# Patient Record
Sex: Female | Born: 1935 | Race: White | Hispanic: No | Smoking: Never smoker
Health system: Southern US, Community
[De-identification: ages and names within clinical notes are randomized; demographics above are authoritative.]

## PROBLEM LIST (undated history)

## (undated) DIAGNOSIS — I480 Paroxysmal atrial fibrillation: Secondary | ICD-10-CM

## (undated) DIAGNOSIS — N362 Urethral caruncle: Secondary | ICD-10-CM

## (undated) DIAGNOSIS — K219 Gastro-esophageal reflux disease without esophagitis: Secondary | ICD-10-CM

## (undated) DIAGNOSIS — Z8719 Personal history of other diseases of the digestive system: Secondary | ICD-10-CM

## (undated) DIAGNOSIS — M1990 Unspecified osteoarthritis, unspecified site: Secondary | ICD-10-CM

## (undated) DIAGNOSIS — I495 Sick sinus syndrome: Secondary | ICD-10-CM

## (undated) DIAGNOSIS — R040 Epistaxis: Secondary | ICD-10-CM

## (undated) DIAGNOSIS — R42 Dizziness and giddiness: Secondary | ICD-10-CM

## (undated) DIAGNOSIS — I447 Left bundle-branch block, unspecified: Secondary | ICD-10-CM

## (undated) DIAGNOSIS — N183 Chronic kidney disease, stage 3 unspecified: Secondary | ICD-10-CM

## (undated) DIAGNOSIS — K589 Irritable bowel syndrome without diarrhea: Secondary | ICD-10-CM

## (undated) DIAGNOSIS — I5032 Chronic diastolic (congestive) heart failure: Secondary | ICD-10-CM

## (undated) DIAGNOSIS — K5790 Diverticulosis of intestine, part unspecified, without perforation or abscess without bleeding: Secondary | ICD-10-CM

## (undated) DIAGNOSIS — C9110 Chronic lymphocytic leukemia of B-cell type not having achieved remission: Secondary | ICD-10-CM

## (undated) DIAGNOSIS — R350 Frequency of micturition: Secondary | ICD-10-CM

## (undated) DIAGNOSIS — I951 Orthostatic hypotension: Secondary | ICD-10-CM

## (undated) DIAGNOSIS — D500 Iron deficiency anemia secondary to blood loss (chronic): Secondary | ICD-10-CM

## (undated) DIAGNOSIS — D369 Benign neoplasm, unspecified site: Secondary | ICD-10-CM

## (undated) DIAGNOSIS — I340 Nonrheumatic mitral (valve) insufficiency: Secondary | ICD-10-CM

## (undated) DIAGNOSIS — I2510 Atherosclerotic heart disease of native coronary artery without angina pectoris: Secondary | ICD-10-CM

## (undated) DIAGNOSIS — I350 Nonrheumatic aortic (valve) stenosis: Secondary | ICD-10-CM

## (undated) DIAGNOSIS — L719 Rosacea, unspecified: Secondary | ICD-10-CM

## (undated) HISTORY — DX: Rosacea, unspecified: L71.9

## (undated) HISTORY — DX: Epistaxis: R04.0

## (undated) HISTORY — DX: Sick sinus syndrome: I49.5

## (undated) HISTORY — DX: Orthostatic hypotension: I95.1

## (undated) HISTORY — DX: Iron deficiency anemia secondary to blood loss (chronic): D50.0

## (undated) HISTORY — PX: CHOLECYSTECTOMY: SHX55

## (undated) HISTORY — PX: LUMBAR DISC SURGERY: SHX700

## (undated) HISTORY — DX: Dizziness and giddiness: R42

## (undated) HISTORY — DX: Irritable bowel syndrome, unspecified: K58.9

## (undated) HISTORY — DX: Gastro-esophageal reflux disease without esophagitis: K21.9

## (undated) HISTORY — DX: Chronic lymphocytic leukemia of B-cell type not having achieved remission: C91.10

## (undated) HISTORY — DX: Nonrheumatic aortic (valve) stenosis: I35.0

## (undated) HISTORY — PX: CATARACT EXTRACTION: SUR2

## (undated) HISTORY — DX: Urethral caruncle: N36.2

## (undated) HISTORY — DX: Frequency of micturition: R35.0

## (undated) HISTORY — PX: CERVICAL SPINE SURGERY: SHX589

## (undated) HISTORY — DX: Personal history of other diseases of the digestive system: Z87.19

## (undated) HISTORY — PX: BLADDER SUSPENSION: SHX72

## (undated) HISTORY — DX: Unspecified osteoarthritis, unspecified site: M19.90

## (undated) HISTORY — DX: Left bundle-branch block, unspecified: I44.7

---

## 1970-03-16 HISTORY — PX: ABDOMINAL HYSTERECTOMY: SHX81

## 2001-05-11 ENCOUNTER — Ambulatory Visit (HOSPITAL_COMMUNITY): Admission: RE | Admit: 2001-05-11 | Discharge: 2001-05-11 | Payer: Self-pay | Admitting: Specialist

## 2001-05-11 ENCOUNTER — Encounter: Payer: Self-pay | Admitting: Specialist

## 2001-06-22 ENCOUNTER — Encounter: Payer: Self-pay | Admitting: Emergency Medicine

## 2001-06-23 ENCOUNTER — Encounter: Payer: Self-pay | Admitting: Cardiology

## 2001-06-23 ENCOUNTER — Inpatient Hospital Stay (HOSPITAL_COMMUNITY): Admission: EM | Admit: 2001-06-23 | Discharge: 2001-06-25 | Payer: Self-pay | Admitting: Emergency Medicine

## 2001-09-13 ENCOUNTER — Encounter (INDEPENDENT_AMBULATORY_CARE_PROVIDER_SITE_OTHER): Payer: Self-pay | Admitting: *Deleted

## 2001-09-13 LAB — CONVERTED CEMR LAB
AST: 20 units/L
Albumin: 4.2 g/dL
Alkaline Phosphatase: 95 units/L
Bilirubin, Direct: 0.1 mg/dL
Total Protein: 7.3 g/dL

## 2002-01-30 ENCOUNTER — Ambulatory Visit (HOSPITAL_COMMUNITY): Admission: RE | Admit: 2002-01-30 | Discharge: 2002-01-30 | Payer: Self-pay | Admitting: Ophthalmology

## 2002-05-15 ENCOUNTER — Encounter: Payer: Self-pay | Admitting: Specialist

## 2002-05-15 ENCOUNTER — Ambulatory Visit (HOSPITAL_COMMUNITY): Admission: RE | Admit: 2002-05-15 | Discharge: 2002-05-15 | Payer: Self-pay | Admitting: Specialist

## 2002-10-09 ENCOUNTER — Ambulatory Visit (HOSPITAL_COMMUNITY): Admission: RE | Admit: 2002-10-09 | Discharge: 2002-10-09 | Payer: Self-pay | Admitting: Pulmonary Disease

## 2003-04-03 ENCOUNTER — Emergency Department (HOSPITAL_COMMUNITY): Admission: EM | Admit: 2003-04-03 | Discharge: 2003-04-03 | Payer: Self-pay | Admitting: Emergency Medicine

## 2003-07-30 ENCOUNTER — Ambulatory Visit (HOSPITAL_COMMUNITY): Admission: RE | Admit: 2003-07-30 | Discharge: 2003-07-30 | Payer: Self-pay | Admitting: Specialist

## 2003-08-25 ENCOUNTER — Emergency Department (HOSPITAL_COMMUNITY): Admission: EM | Admit: 2003-08-25 | Discharge: 2003-08-25 | Payer: Self-pay | Admitting: Emergency Medicine

## 2003-09-11 ENCOUNTER — Ambulatory Visit (HOSPITAL_COMMUNITY): Admission: RE | Admit: 2003-09-11 | Discharge: 2003-09-11 | Payer: Self-pay | Admitting: Pulmonary Disease

## 2003-09-24 ENCOUNTER — Ambulatory Visit (HOSPITAL_COMMUNITY): Admission: RE | Admit: 2003-09-24 | Discharge: 2003-09-24 | Payer: Self-pay | Admitting: Pulmonary Disease

## 2004-07-03 ENCOUNTER — Ambulatory Visit (HOSPITAL_COMMUNITY): Admission: RE | Admit: 2004-07-03 | Discharge: 2004-07-03 | Payer: Self-pay | Admitting: Pulmonary Disease

## 2004-09-03 ENCOUNTER — Ambulatory Visit (HOSPITAL_COMMUNITY): Admission: RE | Admit: 2004-09-03 | Discharge: 2004-09-03 | Payer: Self-pay | Admitting: Specialist

## 2004-10-01 ENCOUNTER — Ambulatory Visit (HOSPITAL_COMMUNITY): Admission: RE | Admit: 2004-10-01 | Discharge: 2004-10-02 | Payer: Self-pay | Admitting: Pulmonary Disease

## 2004-10-03 ENCOUNTER — Ambulatory Visit: Payer: Self-pay | Admitting: Cardiology

## 2005-08-03 ENCOUNTER — Ambulatory Visit: Payer: Self-pay | Admitting: Internal Medicine

## 2005-08-05 ENCOUNTER — Ambulatory Visit: Payer: Self-pay | Admitting: Internal Medicine

## 2005-08-05 ENCOUNTER — Ambulatory Visit (HOSPITAL_COMMUNITY): Admission: RE | Admit: 2005-08-05 | Discharge: 2005-08-05 | Payer: Self-pay | Admitting: Internal Medicine

## 2005-08-05 ENCOUNTER — Encounter (INDEPENDENT_AMBULATORY_CARE_PROVIDER_SITE_OTHER): Payer: Self-pay | Admitting: Specialist

## 2005-08-11 ENCOUNTER — Ambulatory Visit: Payer: Self-pay | Admitting: Internal Medicine

## 2005-10-27 ENCOUNTER — Ambulatory Visit (HOSPITAL_COMMUNITY): Admission: RE | Admit: 2005-10-27 | Discharge: 2005-10-27 | Payer: Self-pay | Admitting: Specialist

## 2005-12-09 ENCOUNTER — Ambulatory Visit: Payer: Self-pay | Admitting: Internal Medicine

## 2005-12-17 ENCOUNTER — Ambulatory Visit: Payer: Self-pay | Admitting: Internal Medicine

## 2005-12-17 ENCOUNTER — Ambulatory Visit (HOSPITAL_COMMUNITY): Admission: RE | Admit: 2005-12-17 | Discharge: 2005-12-17 | Payer: Self-pay | Admitting: Internal Medicine

## 2005-12-17 ENCOUNTER — Encounter (INDEPENDENT_AMBULATORY_CARE_PROVIDER_SITE_OTHER): Payer: Self-pay | Admitting: Specialist

## 2006-02-12 ENCOUNTER — Ambulatory Visit: Payer: Self-pay | Admitting: Internal Medicine

## 2006-04-28 ENCOUNTER — Ambulatory Visit (HOSPITAL_COMMUNITY): Admission: RE | Admit: 2006-04-28 | Discharge: 2006-04-28 | Payer: Self-pay | Admitting: Pulmonary Disease

## 2006-10-04 ENCOUNTER — Ambulatory Visit: Payer: Self-pay | Admitting: Cardiology

## 2006-10-04 ENCOUNTER — Encounter (INDEPENDENT_AMBULATORY_CARE_PROVIDER_SITE_OTHER): Payer: Self-pay | Admitting: Family Medicine

## 2006-10-04 ENCOUNTER — Inpatient Hospital Stay (HOSPITAL_COMMUNITY): Admission: EM | Admit: 2006-10-04 | Discharge: 2006-10-06 | Payer: Self-pay | Admitting: Emergency Medicine

## 2006-10-07 ENCOUNTER — Ambulatory Visit: Payer: Self-pay | Admitting: Cardiology

## 2006-10-14 ENCOUNTER — Ambulatory Visit: Payer: Self-pay | Admitting: Cardiology

## 2006-10-21 ENCOUNTER — Ambulatory Visit: Payer: Self-pay | Admitting: Cardiology

## 2006-10-25 ENCOUNTER — Ambulatory Visit: Payer: Self-pay | Admitting: Cardiology

## 2006-11-02 ENCOUNTER — Ambulatory Visit: Payer: Self-pay | Admitting: Cardiovascular Disease

## 2006-11-02 ENCOUNTER — Inpatient Hospital Stay (HOSPITAL_COMMUNITY): Admission: EM | Admit: 2006-11-02 | Discharge: 2006-11-06 | Payer: Self-pay | Admitting: Emergency Medicine

## 2006-11-12 ENCOUNTER — Ambulatory Visit: Payer: Self-pay | Admitting: Cardiology

## 2006-11-15 DIAGNOSIS — I495 Sick sinus syndrome: Secondary | ICD-10-CM

## 2006-11-15 HISTORY — PX: A-V CARDIAC PACEMAKER INSERTION: SHX562

## 2006-11-16 ENCOUNTER — Encounter: Payer: Self-pay | Admitting: Cardiology

## 2006-11-17 ENCOUNTER — Inpatient Hospital Stay (HOSPITAL_COMMUNITY): Admission: AD | Admit: 2006-11-17 | Discharge: 2006-11-20 | Payer: Self-pay | Admitting: Cardiology

## 2006-11-17 ENCOUNTER — Ambulatory Visit: Payer: Self-pay | Admitting: Internal Medicine

## 2006-11-17 ENCOUNTER — Ambulatory Visit: Payer: Self-pay | Admitting: Cardiology

## 2006-11-25 ENCOUNTER — Ambulatory Visit: Payer: Self-pay

## 2006-12-06 ENCOUNTER — Ambulatory Visit: Payer: Self-pay | Admitting: Cardiology

## 2006-12-13 ENCOUNTER — Ambulatory Visit: Payer: Self-pay | Admitting: Internal Medicine

## 2007-01-03 ENCOUNTER — Ambulatory Visit: Payer: Self-pay | Admitting: Cardiology

## 2007-01-10 ENCOUNTER — Ambulatory Visit: Payer: Self-pay | Admitting: Internal Medicine

## 2007-01-24 ENCOUNTER — Ambulatory Visit: Payer: Self-pay | Admitting: Cardiology

## 2007-02-23 ENCOUNTER — Ambulatory Visit: Payer: Self-pay | Admitting: Internal Medicine

## 2007-03-03 ENCOUNTER — Ambulatory Visit: Payer: Self-pay | Admitting: Internal Medicine

## 2007-03-04 ENCOUNTER — Ambulatory Visit (HOSPITAL_COMMUNITY): Admission: RE | Admit: 2007-03-04 | Discharge: 2007-03-04 | Payer: Self-pay | Admitting: Internal Medicine

## 2007-03-07 ENCOUNTER — Ambulatory Visit: Payer: Self-pay | Admitting: Gastroenterology

## 2007-03-08 ENCOUNTER — Ambulatory Visit: Payer: Self-pay | Admitting: Cardiology

## 2007-03-15 ENCOUNTER — Ambulatory Visit: Payer: Self-pay | Admitting: Cardiovascular Disease

## 2007-03-23 ENCOUNTER — Encounter: Payer: Self-pay | Admitting: Internal Medicine

## 2007-03-23 ENCOUNTER — Ambulatory Visit: Payer: Self-pay | Admitting: Internal Medicine

## 2007-03-23 ENCOUNTER — Ambulatory Visit (HOSPITAL_COMMUNITY): Admission: RE | Admit: 2007-03-23 | Discharge: 2007-03-23 | Payer: Self-pay | Admitting: Internal Medicine

## 2007-04-04 ENCOUNTER — Ambulatory Visit: Payer: Self-pay | Admitting: Cardiology

## 2007-04-18 ENCOUNTER — Ambulatory Visit: Payer: Self-pay | Admitting: Cardiology

## 2007-04-19 ENCOUNTER — Ambulatory Visit: Payer: Self-pay | Admitting: Internal Medicine

## 2007-04-29 ENCOUNTER — Encounter: Payer: Self-pay | Admitting: Internal Medicine

## 2007-04-29 ENCOUNTER — Ambulatory Visit (HOSPITAL_COMMUNITY): Admission: RE | Admit: 2007-04-29 | Discharge: 2007-04-29 | Payer: Self-pay | Admitting: Internal Medicine

## 2007-04-29 ENCOUNTER — Ambulatory Visit: Payer: Self-pay | Admitting: Internal Medicine

## 2007-05-02 ENCOUNTER — Ambulatory Visit: Payer: Self-pay | Admitting: Cardiology

## 2007-05-06 ENCOUNTER — Ambulatory Visit: Payer: Self-pay | Admitting: Cardiology

## 2007-05-10 ENCOUNTER — Ambulatory Visit: Payer: Self-pay | Admitting: Cardiology

## 2007-05-17 ENCOUNTER — Ambulatory Visit: Payer: Self-pay | Admitting: Cardiology

## 2007-05-23 ENCOUNTER — Ambulatory Visit: Payer: Self-pay | Admitting: Internal Medicine

## 2007-06-03 ENCOUNTER — Ambulatory Visit: Payer: Self-pay | Admitting: Cardiology

## 2007-07-01 ENCOUNTER — Ambulatory Visit: Payer: Self-pay | Admitting: Cardiology

## 2007-07-29 ENCOUNTER — Ambulatory Visit: Payer: Self-pay | Admitting: Cardiology

## 2007-08-04 ENCOUNTER — Ambulatory Visit: Payer: Self-pay | Admitting: Cardiovascular Disease

## 2007-08-12 ENCOUNTER — Ambulatory Visit: Payer: Self-pay | Admitting: Cardiology

## 2007-08-22 ENCOUNTER — Encounter: Payer: Self-pay | Admitting: Internal Medicine

## 2007-08-22 ENCOUNTER — Ambulatory Visit (HOSPITAL_COMMUNITY): Admission: RE | Admit: 2007-08-22 | Discharge: 2007-08-22 | Payer: Self-pay | Admitting: Internal Medicine

## 2007-08-22 ENCOUNTER — Ambulatory Visit: Payer: Self-pay | Admitting: Internal Medicine

## 2007-08-29 ENCOUNTER — Ambulatory Visit: Payer: Self-pay | Admitting: Cardiology

## 2007-09-05 ENCOUNTER — Ambulatory Visit: Payer: Self-pay | Admitting: Internal Medicine

## 2007-09-05 ENCOUNTER — Ambulatory Visit: Payer: Self-pay | Admitting: Cardiology

## 2007-09-12 ENCOUNTER — Ambulatory Visit: Payer: Self-pay | Admitting: Internal Medicine

## 2007-10-03 ENCOUNTER — Ambulatory Visit: Payer: Self-pay | Admitting: Internal Medicine

## 2007-10-03 ENCOUNTER — Ambulatory Visit: Payer: Self-pay | Admitting: Cardiology

## 2007-10-06 ENCOUNTER — Ambulatory Visit: Payer: Self-pay | Admitting: Cardiology

## 2007-10-06 ENCOUNTER — Encounter (HOSPITAL_COMMUNITY): Admission: RE | Admit: 2007-10-06 | Discharge: 2007-11-05 | Payer: Self-pay | Admitting: Cardiology

## 2007-10-20 ENCOUNTER — Ambulatory Visit (HOSPITAL_COMMUNITY): Payer: Self-pay | Admitting: Oncology

## 2007-10-20 ENCOUNTER — Encounter (HOSPITAL_COMMUNITY): Admission: RE | Admit: 2007-10-20 | Discharge: 2007-11-19 | Payer: Self-pay | Admitting: Oncology

## 2007-10-31 ENCOUNTER — Ambulatory Visit (HOSPITAL_COMMUNITY): Admission: RE | Admit: 2007-10-31 | Discharge: 2007-10-31 | Payer: Self-pay | Admitting: Internal Medicine

## 2007-10-31 ENCOUNTER — Encounter: Payer: Self-pay | Admitting: Internal Medicine

## 2007-10-31 ENCOUNTER — Ambulatory Visit: Payer: Self-pay | Admitting: Internal Medicine

## 2007-11-22 ENCOUNTER — Encounter (HOSPITAL_COMMUNITY): Admission: RE | Admit: 2007-11-22 | Discharge: 2007-12-22 | Payer: Self-pay | Admitting: Oncology

## 2007-11-23 ENCOUNTER — Encounter (HOSPITAL_COMMUNITY): Payer: Self-pay | Admitting: Oncology

## 2007-12-14 ENCOUNTER — Ambulatory Visit: Payer: Self-pay | Admitting: Internal Medicine

## 2007-12-14 ENCOUNTER — Encounter (INDEPENDENT_AMBULATORY_CARE_PROVIDER_SITE_OTHER): Payer: Self-pay | Admitting: Family Medicine

## 2007-12-14 ENCOUNTER — Inpatient Hospital Stay (HOSPITAL_COMMUNITY): Admission: EM | Admit: 2007-12-14 | Discharge: 2007-12-23 | Payer: Self-pay | Admitting: Emergency Medicine

## 2007-12-14 ENCOUNTER — Ambulatory Visit: Payer: Self-pay | Admitting: Cardiology

## 2007-12-19 ENCOUNTER — Other Ambulatory Visit: Payer: Self-pay | Admitting: Cardiology

## 2007-12-19 ENCOUNTER — Ambulatory Visit: Payer: Self-pay | Admitting: Cardiovascular Disease

## 2007-12-20 ENCOUNTER — Other Ambulatory Visit: Payer: Self-pay | Admitting: Cardiovascular Disease

## 2007-12-20 ENCOUNTER — Other Ambulatory Visit: Payer: Self-pay | Admitting: Cardiology

## 2007-12-21 ENCOUNTER — Other Ambulatory Visit: Payer: Self-pay | Admitting: Cardiovascular Disease

## 2007-12-22 ENCOUNTER — Other Ambulatory Visit: Payer: Self-pay | Admitting: Cardiovascular Disease

## 2007-12-23 ENCOUNTER — Other Ambulatory Visit: Payer: Self-pay | Admitting: Cardiovascular Disease

## 2008-01-02 ENCOUNTER — Ambulatory Visit: Payer: Self-pay | Admitting: Cardiology

## 2008-01-04 ENCOUNTER — Ambulatory Visit (HOSPITAL_COMMUNITY): Admission: RE | Admit: 2008-01-04 | Discharge: 2008-01-04 | Payer: Self-pay | Admitting: Cardiology

## 2008-01-09 ENCOUNTER — Ambulatory Visit: Payer: Self-pay | Admitting: Cardiology

## 2008-01-16 ENCOUNTER — Ambulatory Visit (HOSPITAL_COMMUNITY): Payer: Self-pay | Admitting: Oncology

## 2008-01-16 ENCOUNTER — Encounter (HOSPITAL_COMMUNITY): Admission: RE | Admit: 2008-01-16 | Discharge: 2008-02-15 | Payer: Self-pay | Admitting: Oncology

## 2008-01-23 ENCOUNTER — Ambulatory Visit: Payer: Self-pay | Admitting: Cardiology

## 2008-02-02 ENCOUNTER — Ambulatory Visit: Payer: Self-pay | Admitting: Cardiology

## 2008-02-03 ENCOUNTER — Ambulatory Visit (HOSPITAL_COMMUNITY): Admission: RE | Admit: 2008-02-03 | Discharge: 2008-02-03 | Payer: Self-pay | Admitting: Cardiology

## 2008-02-20 ENCOUNTER — Ambulatory Visit: Payer: Self-pay | Admitting: Cardiology

## 2008-02-28 ENCOUNTER — Encounter (HOSPITAL_COMMUNITY): Admission: RE | Admit: 2008-02-28 | Discharge: 2008-03-29 | Payer: Self-pay | Admitting: Oncology

## 2008-03-12 ENCOUNTER — Ambulatory Visit: Payer: Self-pay | Admitting: Cardiology

## 2008-03-20 ENCOUNTER — Ambulatory Visit (HOSPITAL_COMMUNITY): Payer: Self-pay | Admitting: Oncology

## 2008-03-27 ENCOUNTER — Encounter (HOSPITAL_COMMUNITY): Payer: Self-pay | Admitting: Oncology

## 2008-03-30 ENCOUNTER — Ambulatory Visit (HOSPITAL_COMMUNITY): Admission: RE | Admit: 2008-03-30 | Discharge: 2008-03-30 | Payer: Self-pay | Admitting: Oncology

## 2008-04-02 ENCOUNTER — Ambulatory Visit: Payer: Self-pay | Admitting: Cardiology

## 2008-04-06 ENCOUNTER — Ambulatory Visit: Payer: Self-pay | Admitting: Cardiology

## 2008-04-06 ENCOUNTER — Ambulatory Visit (HOSPITAL_COMMUNITY): Admission: RE | Admit: 2008-04-06 | Discharge: 2008-04-06 | Payer: Self-pay | Admitting: Cardiology

## 2008-04-10 ENCOUNTER — Encounter (HOSPITAL_COMMUNITY): Admission: RE | Admit: 2008-04-10 | Discharge: 2008-05-10 | Payer: Self-pay | Admitting: Oncology

## 2008-04-13 ENCOUNTER — Ambulatory Visit: Payer: Self-pay | Admitting: Cardiology

## 2008-04-19 ENCOUNTER — Ambulatory Visit: Payer: Self-pay | Admitting: Cardiology

## 2008-05-07 ENCOUNTER — Ambulatory Visit (HOSPITAL_COMMUNITY): Payer: Self-pay | Admitting: Oncology

## 2008-05-11 ENCOUNTER — Encounter (HOSPITAL_COMMUNITY): Admission: RE | Admit: 2008-05-11 | Discharge: 2008-06-10 | Payer: Self-pay | Admitting: Oncology

## 2008-05-17 ENCOUNTER — Ambulatory Visit: Payer: Self-pay | Admitting: Cardiology

## 2008-05-21 ENCOUNTER — Ambulatory Visit: Payer: Self-pay | Admitting: Cardiology

## 2008-05-28 ENCOUNTER — Encounter: Payer: Self-pay | Admitting: Internal Medicine

## 2008-05-29 ENCOUNTER — Encounter (INDEPENDENT_AMBULATORY_CARE_PROVIDER_SITE_OTHER): Payer: Self-pay | Admitting: *Deleted

## 2008-05-29 LAB — CONVERTED CEMR LAB
ALT: 19 units/L
AST: 23 units/L
Albumin: 4 g/dL
Alkaline Phosphatase: 88 units/L
Calcium: 9.3 mg/dL
HDL: 26 mg/dL
LDL Cholesterol: 43 mg/dL
Potassium: 4.8 meq/L
Sodium: 142 meq/L
Total Protein: 6.6 g/dL
Triglycerides: 259 mg/dL

## 2008-06-11 ENCOUNTER — Ambulatory Visit: Payer: Self-pay | Admitting: Cardiology

## 2008-06-12 ENCOUNTER — Encounter (HOSPITAL_COMMUNITY): Admission: RE | Admit: 2008-06-12 | Discharge: 2008-06-26 | Payer: Self-pay | Admitting: Oncology

## 2008-06-22 ENCOUNTER — Encounter: Payer: Self-pay | Admitting: Internal Medicine

## 2008-06-26 ENCOUNTER — Ambulatory Visit (HOSPITAL_COMMUNITY): Payer: Self-pay | Admitting: Oncology

## 2008-06-27 ENCOUNTER — Ambulatory Visit: Payer: Self-pay | Admitting: Internal Medicine

## 2008-07-09 ENCOUNTER — Ambulatory Visit: Payer: Self-pay | Admitting: Cardiology

## 2008-07-11 ENCOUNTER — Encounter: Payer: Self-pay | Admitting: Internal Medicine

## 2008-07-24 ENCOUNTER — Encounter (HOSPITAL_COMMUNITY): Admission: RE | Admit: 2008-07-24 | Discharge: 2008-08-23 | Payer: Self-pay | Admitting: Oncology

## 2008-07-30 ENCOUNTER — Encounter (INDEPENDENT_AMBULATORY_CARE_PROVIDER_SITE_OTHER): Payer: Self-pay | Admitting: *Deleted

## 2008-07-30 LAB — CONVERTED CEMR LAB
CO2: 24 meq/L
Chloride: 107 meq/L
Creatinine, Ser: 1.11 mg/dL
Potassium: 4 meq/L
Sodium: 143 meq/L

## 2008-08-06 ENCOUNTER — Ambulatory Visit: Payer: Self-pay | Admitting: Cardiology

## 2008-08-14 ENCOUNTER — Encounter: Payer: Self-pay | Admitting: Internal Medicine

## 2008-08-14 ENCOUNTER — Ambulatory Visit (HOSPITAL_COMMUNITY): Payer: Self-pay | Admitting: Oncology

## 2008-09-06 ENCOUNTER — Ambulatory Visit: Payer: Self-pay | Admitting: Cardiology

## 2008-09-12 ENCOUNTER — Encounter (HOSPITAL_COMMUNITY): Admission: RE | Admit: 2008-09-12 | Discharge: 2008-10-12 | Payer: Self-pay | Admitting: Oncology

## 2008-10-04 ENCOUNTER — Ambulatory Visit: Payer: Self-pay | Admitting: Cardiology

## 2008-10-10 ENCOUNTER — Ambulatory Visit (HOSPITAL_COMMUNITY): Payer: Self-pay | Admitting: Oncology

## 2008-10-23 ENCOUNTER — Encounter (HOSPITAL_COMMUNITY): Admission: RE | Admit: 2008-10-23 | Discharge: 2008-11-22 | Payer: Self-pay | Admitting: Oncology

## 2008-10-29 ENCOUNTER — Encounter: Payer: Self-pay | Admitting: *Deleted

## 2008-11-05 ENCOUNTER — Ambulatory Visit: Payer: Self-pay | Admitting: Cardiology

## 2008-11-05 LAB — CONVERTED CEMR LAB: POC INR: 2.5

## 2008-12-03 ENCOUNTER — Ambulatory Visit: Payer: Self-pay | Admitting: Cardiology

## 2008-12-14 DIAGNOSIS — D369 Benign neoplasm, unspecified site: Secondary | ICD-10-CM

## 2008-12-14 HISTORY — DX: Benign neoplasm, unspecified site: D36.9

## 2008-12-17 DIAGNOSIS — I10 Essential (primary) hypertension: Secondary | ICD-10-CM | POA: Insufficient documentation

## 2008-12-17 DIAGNOSIS — E785 Hyperlipidemia, unspecified: Secondary | ICD-10-CM

## 2008-12-18 ENCOUNTER — Ambulatory Visit: Payer: Self-pay | Admitting: Internal Medicine

## 2008-12-19 ENCOUNTER — Encounter: Payer: Self-pay | Admitting: Internal Medicine

## 2008-12-19 ENCOUNTER — Encounter (HOSPITAL_COMMUNITY): Admission: RE | Admit: 2008-12-19 | Discharge: 2009-01-18 | Payer: Self-pay | Admitting: Oncology

## 2008-12-19 ENCOUNTER — Ambulatory Visit (HOSPITAL_COMMUNITY): Payer: Self-pay | Admitting: Oncology

## 2008-12-20 DIAGNOSIS — K269 Duodenal ulcer, unspecified as acute or chronic, without hemorrhage or perforation: Secondary | ICD-10-CM | POA: Insufficient documentation

## 2008-12-31 ENCOUNTER — Ambulatory Visit: Payer: Self-pay | Admitting: Cardiology

## 2009-01-09 ENCOUNTER — Ambulatory Visit: Payer: Self-pay | Admitting: Internal Medicine

## 2009-01-09 ENCOUNTER — Encounter: Payer: Self-pay | Admitting: Internal Medicine

## 2009-01-09 ENCOUNTER — Ambulatory Visit (HOSPITAL_COMMUNITY): Admission: RE | Admit: 2009-01-09 | Discharge: 2009-01-09 | Payer: Self-pay | Admitting: Internal Medicine

## 2009-01-09 HISTORY — PX: ESOPHAGOGASTRODUODENOSCOPY: SHX1529

## 2009-01-10 ENCOUNTER — Encounter: Payer: Self-pay | Admitting: Internal Medicine

## 2009-01-13 ENCOUNTER — Encounter: Payer: Self-pay | Admitting: Internal Medicine

## 2009-01-15 ENCOUNTER — Ambulatory Visit: Payer: Self-pay | Admitting: Cardiology

## 2009-01-15 ENCOUNTER — Encounter: Payer: Self-pay | Admitting: Internal Medicine

## 2009-01-21 ENCOUNTER — Encounter: Payer: Self-pay | Admitting: Internal Medicine

## 2009-01-21 ENCOUNTER — Ambulatory Visit: Payer: Self-pay | Admitting: Cardiology

## 2009-01-30 ENCOUNTER — Encounter (HOSPITAL_COMMUNITY): Admission: RE | Admit: 2009-01-30 | Discharge: 2009-03-01 | Payer: Self-pay | Admitting: Oncology

## 2009-02-11 ENCOUNTER — Ambulatory Visit: Payer: Self-pay | Admitting: Cardiology

## 2009-02-11 LAB — CONVERTED CEMR LAB: POC INR: 3.9

## 2009-02-25 ENCOUNTER — Encounter (INDEPENDENT_AMBULATORY_CARE_PROVIDER_SITE_OTHER): Payer: Self-pay | Admitting: *Deleted

## 2009-02-27 ENCOUNTER — Ambulatory Visit (HOSPITAL_COMMUNITY): Payer: Self-pay | Admitting: Oncology

## 2009-03-07 ENCOUNTER — Ambulatory Visit: Payer: Self-pay | Admitting: Cardiology

## 2009-03-13 ENCOUNTER — Encounter (HOSPITAL_COMMUNITY): Admission: RE | Admit: 2009-03-13 | Discharge: 2009-04-12 | Payer: Self-pay | Admitting: Oncology

## 2009-03-13 ENCOUNTER — Encounter (INDEPENDENT_AMBULATORY_CARE_PROVIDER_SITE_OTHER): Payer: Self-pay | Admitting: *Deleted

## 2009-03-19 ENCOUNTER — Encounter: Payer: Self-pay | Admitting: Internal Medicine

## 2009-03-21 ENCOUNTER — Encounter (INDEPENDENT_AMBULATORY_CARE_PROVIDER_SITE_OTHER): Payer: Self-pay | Admitting: *Deleted

## 2009-03-22 ENCOUNTER — Encounter (INDEPENDENT_AMBULATORY_CARE_PROVIDER_SITE_OTHER): Payer: Self-pay | Admitting: *Deleted

## 2009-03-22 ENCOUNTER — Ambulatory Visit (HOSPITAL_COMMUNITY): Admission: RE | Admit: 2009-03-22 | Discharge: 2009-03-22 | Payer: Self-pay | Admitting: Cardiology

## 2009-03-22 ENCOUNTER — Ambulatory Visit: Payer: Self-pay | Admitting: Cardiology

## 2009-03-22 DIAGNOSIS — I2510 Atherosclerotic heart disease of native coronary artery without angina pectoris: Secondary | ICD-10-CM | POA: Insufficient documentation

## 2009-03-22 LAB — CONVERTED CEMR LAB
AST: 19 units/L
Albumin: 4.2 g/dL
Alkaline Phosphatase: 77 units/L
BUN: 25 mg/dL
CO2: 26 meq/L
Creatinine, Ser: 1.05 mg/dL
Glucose, Bld: 91 mg/dL
Sodium: 143 meq/L

## 2009-03-27 LAB — CONVERTED CEMR LAB
AST: 19 units/L (ref 0–37)
Albumin: 4.2 g/dL (ref 3.5–5.2)
Alkaline Phosphatase: 77 units/L (ref 39–117)
BUN: 25 mg/dL — ABNORMAL HIGH (ref 6–23)
Basophils Absolute: 0.1 10*3/uL (ref 0.0–0.1)
Basophils Relative: 1 % (ref 0–1)
Eosinophils Relative: 3 % (ref 0–5)
Glucose, Bld: 91 mg/dL (ref 70–99)
Lymphocytes Relative: 60 % — ABNORMAL HIGH (ref 12–46)
MCHC: 29.9 g/dL — ABNORMAL LOW (ref 30.0–36.0)
Monocytes Absolute: 0.9 10*3/uL (ref 0.1–1.0)
Neutro Abs: 6.3 10*3/uL (ref 1.7–7.7)
Platelets: 232 10*3/uL (ref 150–400)
Potassium: 4.8 meq/L (ref 3.5–5.3)
Pro B Natriuretic peptide (BNP): 322.4 pg/mL — ABNORMAL HIGH (ref 0.0–100.0)
RDW: 13.7 % (ref 11.5–15.5)
Total Bilirubin: 0.4 mg/dL (ref 0.3–1.2)

## 2009-03-28 ENCOUNTER — Encounter: Payer: Self-pay | Admitting: Internal Medicine

## 2009-04-04 ENCOUNTER — Ambulatory Visit: Payer: Self-pay | Admitting: Cardiology

## 2009-04-04 LAB — CONVERTED CEMR LAB: POC INR: 3.4

## 2009-04-12 ENCOUNTER — Encounter: Payer: Self-pay | Admitting: Cardiology

## 2009-04-12 LAB — CONVERTED CEMR LAB
CO2: 26 meq/L (ref 19–32)
Chloride: 105 meq/L (ref 96–112)
Potassium: 4.8 meq/L (ref 3.5–5.3)
Sodium: 140 meq/L (ref 135–145)

## 2009-04-16 ENCOUNTER — Encounter (INDEPENDENT_AMBULATORY_CARE_PROVIDER_SITE_OTHER): Payer: Self-pay | Admitting: *Deleted

## 2009-04-24 ENCOUNTER — Encounter (HOSPITAL_COMMUNITY): Admission: RE | Admit: 2009-04-24 | Discharge: 2009-05-24 | Payer: Self-pay | Admitting: Oncology

## 2009-04-24 ENCOUNTER — Ambulatory Visit (HOSPITAL_COMMUNITY): Payer: Self-pay | Admitting: Oncology

## 2009-04-29 ENCOUNTER — Ambulatory Visit: Payer: Self-pay | Admitting: Cardiology

## 2009-05-03 ENCOUNTER — Encounter (INDEPENDENT_AMBULATORY_CARE_PROVIDER_SITE_OTHER): Payer: Self-pay | Admitting: *Deleted

## 2009-05-03 LAB — CONVERTED CEMR LAB
BUN: 27 mg/dL
CO2: 26 meq/L
Calcium: 9.1 mg/dL
Chloride: 106 meq/L
Creatinine, Ser: 1.28 mg/dL
Glucose, Bld: 93 mg/dL
Potassium: 5.1 meq/L
Sodium: 144 meq/L

## 2009-05-06 ENCOUNTER — Encounter (INDEPENDENT_AMBULATORY_CARE_PROVIDER_SITE_OTHER): Payer: Self-pay

## 2009-05-06 ENCOUNTER — Encounter: Payer: Self-pay | Admitting: Cardiology

## 2009-05-06 LAB — CONVERTED CEMR LAB
BUN: 27 mg/dL — ABNORMAL HIGH (ref 6–23)
CO2: 26 meq/L (ref 19–32)
Chloride: 106 meq/L (ref 96–112)
Glucose, Bld: 93 mg/dL (ref 70–99)
Potassium: 5.1 meq/L (ref 3.5–5.3)
Sodium: 144 meq/L (ref 135–145)

## 2009-05-09 ENCOUNTER — Telehealth (INDEPENDENT_AMBULATORY_CARE_PROVIDER_SITE_OTHER): Payer: Self-pay

## 2009-05-16 ENCOUNTER — Encounter (INDEPENDENT_AMBULATORY_CARE_PROVIDER_SITE_OTHER): Payer: Self-pay | Admitting: *Deleted

## 2009-05-20 ENCOUNTER — Encounter: Payer: Self-pay | Admitting: Adult Health

## 2009-05-20 ENCOUNTER — Ambulatory Visit: Payer: Self-pay | Admitting: Cardiology

## 2009-05-21 ENCOUNTER — Encounter (INDEPENDENT_AMBULATORY_CARE_PROVIDER_SITE_OTHER): Payer: Self-pay | Admitting: *Deleted

## 2009-05-21 LAB — CONVERTED CEMR LAB
BUN: 25 mg/dL
Basophils Absolute: 0.1 10*3/uL (ref 0.0–0.1)
Basophils Relative: 0 % (ref 0–1)
CO2: 28 meq/L (ref 19–32)
Calcium: 9.1 mg/dL (ref 8.4–10.5)
Chloride: 105 meq/L
Creatinine, Ser: 1.15 mg/dL (ref 0.40–1.20)
Eosinophils Absolute: 0.3 10*3/uL (ref 0.0–0.7)
Glucose, Bld: 116 mg/dL — ABNORMAL HIGH (ref 70–99)
Hemoglobin, Urine: NEGATIVE
Ketones, ur: NEGATIVE mg/dL
MCHC: 29.7 g/dL — ABNORMAL LOW (ref 30.0–36.0)
MCV: 100 fL
MCV: 100 fL (ref 78.0–100.0)
Monocytes Absolute: 0.6 10*3/uL (ref 0.1–1.0)
Monocytes Relative: 4 % (ref 3–12)
Neutrophils Relative %: 31 % — ABNORMAL LOW (ref 43–77)
Nitrite: NEGATIVE
Potassium: 4.5 meq/L
Protein, ur: NEGATIVE mg/dL
RBC: 3.9 M/uL (ref 3.87–5.11)
RDW: 14.8 % (ref 11.5–15.5)
Urobilinogen, UA: 0.2 (ref 0.0–1.0)

## 2009-05-27 ENCOUNTER — Ambulatory Visit: Payer: Self-pay | Admitting: Cardiology

## 2009-06-10 ENCOUNTER — Ambulatory Visit: Payer: Self-pay | Admitting: Cardiology

## 2009-06-26 ENCOUNTER — Ambulatory Visit: Payer: Self-pay | Admitting: Cardiology

## 2009-06-28 ENCOUNTER — Ambulatory Visit (HOSPITAL_COMMUNITY): Payer: Self-pay | Admitting: Oncology

## 2009-07-01 ENCOUNTER — Encounter (INDEPENDENT_AMBULATORY_CARE_PROVIDER_SITE_OTHER): Payer: Self-pay | Admitting: *Deleted

## 2009-07-01 LAB — CONVERTED CEMR LAB
BUN: 23 mg/dL
BUN: 23 mg/dL
BUN: 23 mg/dL (ref 6–23)
CO2: 27 meq/L (ref 19–32)
Calcium: 9.4 mg/dL
Chloride: 106 meq/L
Chloride: 106 meq/L (ref 96–112)
Creatinine, Ser: 1.06 mg/dL
Creatinine, Ser: 1.06 mg/dL (ref 0.40–1.20)
Glucose, Bld: 123 mg/dL
Glucose, Bld: 123 mg/dL — ABNORMAL HIGH (ref 70–99)
Potassium: 4.4 meq/L

## 2009-07-04 ENCOUNTER — Encounter (INDEPENDENT_AMBULATORY_CARE_PROVIDER_SITE_OTHER): Payer: Self-pay | Admitting: *Deleted

## 2009-07-08 ENCOUNTER — Encounter (HOSPITAL_COMMUNITY): Admission: RE | Admit: 2009-07-08 | Discharge: 2009-08-07 | Payer: Self-pay | Admitting: Oncology

## 2009-07-15 ENCOUNTER — Ambulatory Visit: Payer: Self-pay | Admitting: Cardiology

## 2009-07-15 LAB — CONVERTED CEMR LAB: POC INR: 1.6

## 2009-07-22 ENCOUNTER — Ambulatory Visit: Payer: Self-pay | Admitting: Internal Medicine

## 2009-07-23 ENCOUNTER — Encounter: Payer: Self-pay | Admitting: Internal Medicine

## 2009-07-29 ENCOUNTER — Ambulatory Visit: Payer: Self-pay | Admitting: Cardiology

## 2009-07-29 LAB — CONVERTED CEMR LAB: POC INR: 2.2

## 2009-08-15 ENCOUNTER — Encounter (INDEPENDENT_AMBULATORY_CARE_PROVIDER_SITE_OTHER): Payer: Self-pay | Admitting: *Deleted

## 2009-08-19 ENCOUNTER — Ambulatory Visit (HOSPITAL_COMMUNITY): Payer: Self-pay | Admitting: Oncology

## 2009-08-19 ENCOUNTER — Encounter (HOSPITAL_COMMUNITY): Admission: RE | Admit: 2009-08-19 | Discharge: 2009-09-18 | Payer: Self-pay | Admitting: Oncology

## 2009-08-22 ENCOUNTER — Encounter (INDEPENDENT_AMBULATORY_CARE_PROVIDER_SITE_OTHER): Payer: Self-pay | Admitting: *Deleted

## 2009-08-22 ENCOUNTER — Ambulatory Visit: Payer: Self-pay | Admitting: Cardiology

## 2009-08-22 DIAGNOSIS — R079 Chest pain, unspecified: Secondary | ICD-10-CM

## 2009-08-23 ENCOUNTER — Encounter: Payer: Self-pay | Admitting: Cardiology

## 2009-08-26 ENCOUNTER — Ambulatory Visit: Payer: Self-pay | Admitting: Cardiology

## 2009-08-26 LAB — CONVERTED CEMR LAB
Cholesterol: 108 mg/dL (ref 0–200)
POC INR: 1.7
Total CHOL/HDL Ratio: 4.2

## 2009-09-06 ENCOUNTER — Encounter (INDEPENDENT_AMBULATORY_CARE_PROVIDER_SITE_OTHER): Payer: Self-pay

## 2009-09-18 ENCOUNTER — Ambulatory Visit: Payer: Self-pay | Admitting: Cardiovascular Disease

## 2009-09-18 LAB — CONVERTED CEMR LAB: POC INR: 2.3

## 2009-09-27 ENCOUNTER — Encounter: Payer: Self-pay | Admitting: Cardiology

## 2009-10-07 ENCOUNTER — Encounter (HOSPITAL_COMMUNITY): Admission: RE | Admit: 2009-10-07 | Discharge: 2009-11-06 | Payer: Self-pay | Admitting: Oncology

## 2009-10-07 ENCOUNTER — Ambulatory Visit (HOSPITAL_COMMUNITY): Payer: Self-pay | Admitting: Oncology

## 2009-10-14 ENCOUNTER — Ambulatory Visit: Payer: Self-pay | Admitting: Cardiology

## 2009-10-14 LAB — CONVERTED CEMR LAB: POC INR: 2.4

## 2009-11-08 ENCOUNTER — Encounter (HOSPITAL_COMMUNITY): Admission: RE | Admit: 2009-11-08 | Discharge: 2009-12-08 | Payer: Self-pay | Admitting: Oncology

## 2009-11-11 ENCOUNTER — Ambulatory Visit: Payer: Self-pay | Admitting: Cardiology

## 2009-11-11 LAB — CONVERTED CEMR LAB: POC INR: 2.8

## 2009-11-26 ENCOUNTER — Ambulatory Visit (HOSPITAL_COMMUNITY): Payer: Self-pay | Admitting: Oncology

## 2009-12-09 ENCOUNTER — Encounter (HOSPITAL_COMMUNITY): Admission: RE | Admit: 2009-12-09 | Discharge: 2009-12-13 | Payer: Self-pay | Admitting: Oncology

## 2009-12-09 ENCOUNTER — Ambulatory Visit: Payer: Self-pay | Admitting: Cardiology

## 2009-12-09 LAB — CONVERTED CEMR LAB: POC INR: 1.5

## 2009-12-23 ENCOUNTER — Ambulatory Visit: Payer: Self-pay | Admitting: Cardiology

## 2009-12-23 LAB — CONVERTED CEMR LAB: POC INR: 2.5

## 2010-01-06 ENCOUNTER — Encounter (HOSPITAL_COMMUNITY)
Admission: RE | Admit: 2010-01-06 | Discharge: 2010-02-05 | Payer: Self-pay | Source: Home / Self Care | Admitting: Oncology

## 2010-01-20 ENCOUNTER — Ambulatory Visit: Payer: Self-pay | Admitting: Cardiology

## 2010-01-20 LAB — CONVERTED CEMR LAB: POC INR: 2.4

## 2010-01-31 ENCOUNTER — Ambulatory Visit (HOSPITAL_COMMUNITY): Payer: Self-pay | Admitting: Oncology

## 2010-02-04 ENCOUNTER — Ambulatory Visit: Payer: Self-pay | Admitting: Internal Medicine

## 2010-02-04 ENCOUNTER — Encounter: Payer: Self-pay | Admitting: Internal Medicine

## 2010-02-04 ENCOUNTER — Ambulatory Visit: Payer: Self-pay | Admitting: Cardiology

## 2010-02-17 ENCOUNTER — Ambulatory Visit: Payer: Self-pay | Admitting: Cardiology

## 2010-02-27 ENCOUNTER — Ambulatory Visit: Payer: Self-pay

## 2010-02-27 ENCOUNTER — Encounter (INDEPENDENT_AMBULATORY_CARE_PROVIDER_SITE_OTHER): Payer: Self-pay | Admitting: *Deleted

## 2010-02-28 ENCOUNTER — Encounter (HOSPITAL_COMMUNITY)
Admission: RE | Admit: 2010-02-28 | Discharge: 2010-03-30 | Payer: Self-pay | Source: Home / Self Care | Attending: Oncology | Admitting: Oncology

## 2010-03-04 ENCOUNTER — Ambulatory Visit: Payer: Self-pay | Admitting: Internal Medicine

## 2010-03-05 ENCOUNTER — Ambulatory Visit: Payer: Self-pay | Admitting: Cardiology

## 2010-03-25 ENCOUNTER — Ambulatory Visit (HOSPITAL_COMMUNITY)
Admission: RE | Admit: 2010-03-25 | Discharge: 2010-04-15 | Payer: Self-pay | Source: Home / Self Care | Attending: Oncology | Admitting: Oncology

## 2010-04-01 ENCOUNTER — Encounter (HOSPITAL_COMMUNITY)
Admission: RE | Admit: 2010-04-01 | Discharge: 2010-04-15 | Payer: Self-pay | Source: Home / Self Care | Attending: Oncology | Admitting: Oncology

## 2010-04-02 ENCOUNTER — Ambulatory Visit: Admission: RE | Admit: 2010-04-02 | Discharge: 2010-04-02 | Payer: Self-pay | Source: Home / Self Care

## 2010-04-02 LAB — DIFFERENTIAL
Basophils Absolute: 0 10*3/uL (ref 0.0–0.1)
Basophils Relative: 0 % (ref 0–1)
Eosinophils Absolute: 0.6 10*3/uL (ref 0.0–0.7)
Eosinophils Relative: 3 % (ref 0–5)
Lymphocytes Relative: 63 % — ABNORMAL HIGH (ref 12–46)
Lymphs Abs: 11.6 10*3/uL — ABNORMAL HIGH (ref 0.7–4.0)
Monocytes Absolute: 0.6 10*3/uL (ref 0.1–1.0)
Monocytes Relative: 3 % (ref 3–12)
Neutro Abs: 5.7 10*3/uL (ref 1.7–7.7)
Neutrophils Relative %: 31 % — ABNORMAL LOW (ref 43–77)

## 2010-04-02 LAB — CBC
HCT: 35.6 % — ABNORMAL LOW (ref 36.0–46.0)
Hemoglobin: 11.8 g/dL — ABNORMAL LOW (ref 12.0–15.0)
MCH: 32 pg (ref 26.0–34.0)
MCHC: 33.1 g/dL (ref 30.0–36.0)
MCV: 96.5 fL (ref 78.0–100.0)
Platelets: 198 10*3/uL (ref 150–400)
RBC: 3.69 MIL/uL — ABNORMAL LOW (ref 3.87–5.11)
RDW: 13.3 % (ref 11.5–15.5)
WBC: 18.5 10*3/uL — ABNORMAL HIGH (ref 4.0–10.5)

## 2010-04-03 ENCOUNTER — Encounter: Payer: Self-pay | Admitting: Cardiology

## 2010-04-17 NOTE — Medication Information (Signed)
Summary: protime/tg  Anticoagulant Therapy  Managed by: Vashti Hey, RN PCP: Dr. Juanetta Gosling Supervising MD: Dietrich Pates MD, Molly Maduro Indication 1: Atrial Fibrillation (ICD-427.31) Lab Used: [facility] [place] Site: [place] INR POC 1.5  Dietary changes: no    Health status changes: no    Bleeding/hemorrhagic complications: yes       Details: had nose bleed last week  Recent/future hospitalizations: no    Any changes in medication regimen? no    Recent/future dental: no  Any missed doses?: yes     Details: pt held coumadin 2 days last week due to nose bleed  Is patient compliant with meds? yes       Allergies: 1)  ! * Detrol 2)  Sulfa  Anticoagulation Management History:      The patient is taking warfarin and comes in today for a routine follow up visit.  Positive risk factors for bleeding include an age of 50 years or older.  Negative risk factors for bleeding include no history of CVA/TIA.  The bleeding index is 'intermediate risk'.  Positive CHADS2 values include History of HTN and Age > 64 years old.  Negative CHADS2 values include History of CHF, History of Diabetes, and Prior Stroke/CVA/TIA.  The start date was 10/03/2006.  Anticoagulation responsible provider: Dietrich Pates MD, Molly Maduro.  INR POC: 1.5.  Cuvette Lot#: [id number].  Exp: 10/11.    Anticoagulation Management Assessment/Plan:      The patient's current anticoagulation dose is Warfarin sodium 5 mg tabs: Take as directed by Coumadin Clinic.  The target INR is 2 - 2.5.  The next INR is due 12/23/2009.  Anticoagulation instructions were given to patient.  Results were reviewed/authorized by Vashti Hey, RN.  She was notified by Vashti Hey RN.         Prior Anticoagulation Instructions: INR 2.8 Take coumadin 1/4 tablet tonight then resume 1/2 tablet once daily   Current Anticoagulation Instructions: INR 1.5 Take coumadin 1 tablet tonight then resume 1/2 tablet once daily

## 2010-04-17 NOTE — Medication Information (Signed)
Summary: ccr-lr  Anticoagulant Therapy  Managed by: Vashti Hey, RN PCP: Dr. Hoover Browns MD: Dietrich Pates MD, Molly Maduro Indication 1: Atrial Fibrillation (ICD-427.31) Lab Used: [facility] [place] Site: [place] INR POC 2.7  Dietary changes: no    Health status changes: no    Bleeding/hemorrhagic complications: yes       Details: had 1 nose bleed  Recent/future hospitalizations: no    Any changes in medication regimen? no    Recent/future dental: no  Any missed doses?: yes     Details: missed 1 day   Is patient compliant with meds? yes       Allergies: 1)  ! * Detrol 2)  Sulfa  Anticoagulation Management History:      The patient is taking warfarin and comes in today for a routine follow up visit.  Positive risk factors for bleeding include an age of 65 years or older.  Negative risk factors for bleeding include no history of CVA/TIA.  The bleeding index is 'intermediate risk'.  Positive CHADS2 values include History of CHF, History of HTN, and Age > 56 years old.  Negative CHADS2 values include History of Diabetes and Prior Stroke/CVA/TIA.  The start date was 10/03/2006.  Anticoagulation responsible provider: Dietrich Pates MD, Molly Maduro.  INR POC: 2.7.  Cuvette Lot#: [id number].  Exp: 10/11.    Anticoagulation Management Assessment/Plan:      The patient's current anticoagulation dose is Coumadin 2.5 mg tabs: once daily.  The target INR is 2 - 2.5.  The next INR is due 05/27/2009.  Anticoagulation instructions were given to patient.  Results were reviewed/authorized by Vashti Hey, RN.  She was notified by Vashti Hey RN.         Prior Anticoagulation Instructions: INR 3.4 Decrease coumadin to 2.5mg  once daily   Current Anticoagulation Instructions: INR 2.7 Hold coumadin tonight then resume 2.5mg  once daily

## 2010-04-17 NOTE — Procedures (Signed)
Summary: 6 MTH F/U PER CHECKOUT ON 07/22/09/TG  Medications Added OCUVITE PRESERVISION  TABS (MULTIPLE VITAMINS-MINERALS) one by mouth daily PREMARIN 0.625 MG/GM CREA (ESTROGENS, CONJUGATED) 3 times weekly      Allergies Added:   Current Medications (verified): 1)  Simvastatin 20 Mg Tabs (Simvastatin) .... Once Daily 2)  Verapamil Hcl Cr 240 Mg Xr24h-Cap (Verapamil Hcl) .... Take 1 Tablet By Mouth Once A Day 3)  Prilosec 20 Mg Cpdr (Omeprazole) .... Take 1 Tab Two Times A Day 4)  Tylenol Extra Strength 500 Mg Tabs (Acetaminophen) .... 2 Qid 5)  Multivitamins  Tabs (Multiple Vitamin) .... Once Daily 6)  Procrit 09811 Unit/ml Soln (Epoetin Alfa) .... Q 2 Weeks 7)  Warfarin Sodium 5 Mg Tabs (Warfarin Sodium) .... Take As Directed By Coumadin Clinic 8)  Metoprolol Tartrate 25 Mg Tabs (Metoprolol Tartrate) .... Take 1 Tab Two Times A Day 9)  Oscal 500/200 D-3 500-200 Mg-Unit Tabs (Calcium-Vitamin D) .... Take 1 Tab Daily 10)  Iron Infusion .... Every 4 Months 11)  Lisinopril 10 Mg Tabs (Lisinopril) .... Take One Tablet By Mouth Daily 12)  Ocuvite Preservision  Tabs (Multiple Vitamins-Minerals) .... One By Mouth Daily 13)  Premarin 0.625 Mg/gm Crea (Estrogens, Conjugated) .... 3 Times Weekly  Allergies (verified): 1)  ! * Detrol 2)  Sulfa   PPM Specifications Following MD:  Lewayne Bunting, MD     PPM Vendor:  [employer]onic     PPM Model Number:  [id number]     PPM Serial Number:  [id number] PPM DOI:  11/28/2006     PPM Implanting MD:  Lewayne Bunting, MD  Lead 1    Location: RA     DOI: 11/18/2006     Model #: [id number]     Serial #: [id number]     Status: active Lead 2    Location: RV     DOI: 11/18/2006     Model #: [id number]     Serial #: [id number]     Status: active  Magnet Response Rate:  BOL 85 ERI  65  Indications:  Sick sinus syndrome   PPM Follow Up Remote Check?  No Battery Voltage:  2.77 V     Battery Est. Longevity:  6.5 years     Pacer Dependent:  No       PPM Device  Measurements Atrium  Amplitude: 2.0 mV, Impedance: 386 ohms, Threshold: 1.25 V at 0.4 msec Right Ventricle  Amplitude: 22.40 mV, Impedance: 553 ohms, Threshold: 0.5 V at 0.4 msec  Episodes Coumadin:  Yes Ventricular High Rate:  0     Atrial Pacing:  38.2%     Ventricular Pacing:  46.3%  Parameters Mode:  DDIR     Lower Rate Limit:  60     Upper Rate Limit:  130 Paced AV Delay:  150     Next Cardiology Appt Due:  07/15/2010 Tech Comments:  RA reprogrammed 2.5@0 .4.  Device function normal.   ROV 6 months with Dr. Ladona Ridgel in [place]. Altha Harm, LPN  February 04, 2010 3:23 PM

## 2010-04-17 NOTE — Letter (Signed)
Summary: [facility]   Imported By: Diana Eves 03/28/2009 10:58:10  _____________________________________________________________________  External Attachment:    Type:   Image     Comment:   External Document

## 2010-04-17 NOTE — Medication Information (Signed)
Summary: ccr-lr  Anticoagulant Therapy  Managed by: Vashti Hey, RN PCP: Dr. Juanetta Gosling Supervising MD: Dietrich Pates MD, Molly Maduro Indication 1: Atrial Fibrillation (ICD-427.31) Lab Used: [facility] [place] Site: [place] INR POC 4.4  Dietary changes: no    Health status changes: no    Bleeding/hemorrhagic complications: yes       Details: had bleeding from sore in groin  Recent/future hospitalizations: no    Any changes in medication regimen? no    Recent/future dental: no  Any missed doses?: yes     Details: missed 1 day    Allergies: 1)  ! * Detrol 2)  Sulfa  Anticoagulation Management History:      The patient is taking warfarin and comes in today for a routine follow up visit.  Positive risk factors for bleeding include an age of 75 years or older.  Negative risk factors for bleeding include no history of CVA/TIA.  The bleeding index is 'intermediate risk'.  Positive CHADS2 values include History of CHF, History of HTN, and Age > 10 years old.  Negative CHADS2 values include History of Diabetes and Prior Stroke/CVA/TIA.  The start date was 10/03/2006.  Anticoagulation responsible provider: Dietrich Pates MD, Molly Maduro.  INR POC: 4.4.  Cuvette Lot#: [id number].  Exp: 10/11.    Anticoagulation Management Assessment/Plan:      The patient's current anticoagulation dose is Warfarin sodium 5 mg tabs: Take as directed by Coumadin Clinic.  The target INR is 2 - 2.5.  The next INR is due 06/10/2009.  Anticoagulation instructions were given to patient.  Results were reviewed/authorized by Vashti Hey, RN.  She was notified by Vashti Hey RN.         Prior Anticoagulation Instructions: INR 2.7 Hold coumadin tonight then resume 2.5mg  once daily   Current Anticoagulation Instructions: INR 4.4 Decrease coumadin to 2.5mg  once daily except none on Mondays and Fridays

## 2010-04-17 NOTE — Medication Information (Signed)
Summary: ccr-lr  Anticoagulant Therapy  Managed by: Vashti Hey, RN PCP: Dr. Hoover Browns MD: Eden Emms MD, Theron Arista Indication 1: Atrial Fibrillation (ICD-427.31) Lab Used: [facility] [place] Site: [place] INR POC 2.3  Dietary changes: no    Health status changes: no    Bleeding/hemorrhagic complications: no    Recent/future hospitalizations: no    Any changes in medication regimen? no    Recent/future dental: no  Any missed doses?: no       Is patient compliant with meds? yes       Allergies: 1)  ! * Detrol 2)  Sulfa  Anticoagulation Management History:      The patient is taking warfarin and comes in today for a routine follow up visit.  Positive risk factors for bleeding include an age of 45 years or older.  Negative risk factors for bleeding include no history of CVA/TIA.  The bleeding index is 'intermediate risk'.  Positive CHADS2 values include History of HTN and Age > 38 years old.  Negative CHADS2 values include History of CHF, History of Diabetes, and Prior Stroke/CVA/TIA.  The start date was 10/03/2006.  Anticoagulation responsible provider: Eden Emms MD, Theron Arista.  INR POC: 2.3.  Cuvette Lot#: [id number].  Exp: 10/11.    Anticoagulation Management Assessment/Plan:      The patient's current anticoagulation dose is Warfarin sodium 5 mg tabs: Take as directed by Coumadin Clinic.  The target INR is 2 - 2.5.  The next INR is due 10/14/2009.  Anticoagulation instructions were given to patient.  Results were reviewed/authorized by Vashti Hey, RN.  She was notified by Vashti Hey RN.         Prior Anticoagulation Instructions: INR 1.7 Take coumadin 1 tablet tonight then resume 1/2 tablet once daily   Current Anticoagulation Instructions: INR 2.3 Continue coumadin 2.5mg  once daily

## 2010-04-17 NOTE — Letter (Signed)
Summary: [facility]  [facility]   Imported By: Diana Eves 03/28/2009 10:56:59  _____________________________________________________________________  External Attachment:    Type:   Image     Comment:   External Document

## 2010-04-17 NOTE — Medication Information (Signed)
Summary: ccr-lr  Anticoagulant Therapy  Managed by: Vashti Hey, RN PCP: Dr. Hoover Browns MD: Dietrich Pates MD, Molly Maduro Indication 1: Atrial Fibrillation (ICD-427.31) Lab Used: [facility] [place] Site: [place] INR POC 1.7  Dietary changes: no    Health status changes: no    Bleeding/hemorrhagic complications: no    Recent/future hospitalizations: no    Any changes in medication regimen? no    Recent/future dental: no  Any missed doses?: yes     Details: might have missed 1 dose  Is patient compliant with meds? yes       Allergies: 1)  ! * Detrol 2)  Sulfa  Anticoagulation Management History:      The patient is taking warfarin and comes in today for a routine follow up visit.  Positive risk factors for bleeding include an age of 75 years or older.  Negative risk factors for bleeding include no history of CVA/TIA.  The bleeding index is 'intermediate risk'.  Positive CHADS2 values include History of HTN and Age > 31 years old.  Negative CHADS2 values include History of CHF, History of Diabetes, and Prior Stroke/CVA/TIA.  The start date was 10/03/2006.  Anticoagulation responsible provider: Dietrich Pates MD, Molly Maduro.  INR POC: 1.7.  Cuvette Lot#: [id number].  Exp: 10/11.    Anticoagulation Management Assessment/Plan:      The patient's current anticoagulation dose is Warfarin sodium 5 mg tabs: Take as directed by Coumadin Clinic.  The target INR is 2 - 2.5.  The next INR is due 09/18/2009.  Anticoagulation instructions were given to patient.  Results were reviewed/authorized by Vashti Hey, RN.  She was notified by Vashti Hey RN.         Prior Anticoagulation Instructions: INR 2.2 Continue coumadin 2.5mg  once daily   Current Anticoagulation Instructions: INR 1.7 Take coumadin 1 tablet tonight then resume 1/2 tablet once daily

## 2010-04-17 NOTE — Letter (Signed)
Summary: [place] Results Engineer, agricultural at [facility]  [address] [place] [address]   Phone: [phone]  Fax: [phone]      April 16, 2009 MRN: [id number]   [name] [address] [place], [place]  [address]   Dear Ms. Veto Kemps,  Your test ordered by Selena Batten has been reviewed by your physician (or physician assistant) and was found to be normal or stable. Your physician (or physician assistant) felt no changes were needed at this time.  ____ Echocardiogram  ____ Cardiac Stress Test  __x__ Lab Work  ____ Peripheral vascular study of arms, legs or neck  ____ CT scan or X-ray  ____ Lung or Breathing test  ____ Other: No change in medical treatment at this time, per Dr. Dietrich Pates.  Thank you, Egypt Marchiano Allyne Gee RN    [place] Bing, MD, Lenise Arena.C.Gaylord Shih, MD, F.A.C.C Lewayne Bunting, MD, F.A.C.C Nona Dell, MD, F.A.C.C Charlton Haws, MD, Lenise Arena.C.C

## 2010-04-17 NOTE — Letter (Signed)
Summary: Appointment - Missed  [facility]  [address] [place] [address]   Phone: [phone]  Fax: [phone]     February 27, 2010 MRN: [id number]   [name] [address] [place], [place]  [address]   Dear Ms. [name],  Our records indicate you missed your appointment on    02/27/10 [facility]                It is very important that we reach you to reschedule this appointment. We look forward to participating in your health care needs. Please contact us at the number listed above at your earliest convenience to reschedule this appointment.     Sincerely,    Glass blower/designer

## 2010-04-17 NOTE — Letter (Signed)
Summary: [facility] NOTE  [facility] NOTE   Imported By: Faythe Ghee 04/03/2010 13:06:49  _____________________________________________________________________  External Attachment:    Type:   Image     Comment:   External Document

## 2010-04-17 NOTE — Letter (Signed)
Summary: [place] Future Lab Work Engineer, agricultural at [facility]  [address], [place] [address]   Phone: [phone]  Fax: [phone]     March 22, 2009 MRN: [id number]   [name] [address] [place], [place]  [address]      YOUR LAB WORK IS DUE   _______________FEBRUARY 18, 2011__________________________  Please go to [facility], located across the street from [facility] on the second floor.  Hours are Monday - Friday 7am until 7:30pm         Saturday 8am until 12noon    __  DO NOT EAT OR DRINK AFTER MIDNIGHT EVENING PRIOR TO LABWORK  _X_ YOUR LABWORK IS NOT FASTING --YOU MAY EAT PRIOR TO LABWORK

## 2010-04-17 NOTE — Assessment & Plan Note (Signed)
Summary: past due for f/u/due in 9/10/tg  Medications Added METOPROLOL TARTRATE 25 MG TABS (METOPROLOL TARTRATE) take 1 tab two times a day OSCAL 500/200 D-3 500-200 MG-UNIT TABS (CALCIUM-VITAMIN D) take 1 tab daily * IRON INFUSION every eight weeks FUROSEMIDE 20 MG TABS (FUROSEMIDE) Take one tablet by mouth on Monday, Wednesday and Friday LISINOPRIL 10 MG TABS (LISINOPRIL) Take one tablet by mouth daily      Allergies Added:   Visit Type:  Follow-up Referring Provider:  Dr. Benard Rink Primary Provider:  Dr. Juanetta Gosling   History of Present Illness: [name] returns to the office as scheduled for continued assessment and treatment of sick sinus syndrome, mild valvular heart disease and a history of congestive heart failure with preserved left ventricular systolic function.  She continues to do astoundingly well considering her advanced age of 39 and her multiple medical problems.  She was recently evaluated at [facility] for a colonic adenoma.  Records were obtained and reviewed. After multiple biopsies, it was decided that resection was not necessary.  She has had no more epistaxis and is quite content taking Coumadin.  Her most recent pacemaker assessment showed no episodes of atrial fibrillation.  Unfortunately, she reports progressive exertional dyspnea.  She has had no orthopnea nor PND.  She monitors weight, which has not changed and has remained below 164, her threshold for diuretic use.  She has class 2-3 dyspnea on exertion that resolves promptly with rest.  Anemia is apparently under better control with care by Dr. Mariel Sleet.  She reports variable blood pressures with values frequently above 150 systolic, but closer to 120--130 systolic when evaluated in Dr. Juanetta Gosling office.   Current Medications (verified): 1)  Simvastatin 20 Mg Tabs (Simvastatin) .... Once Daily 2)  Verapamil Hcl Cr 240 Mg Xr24h-Cap (Verapamil Hcl) .... Take 1 Tablet By Mouth Once A Day 3)  Omeprazole 40 Mg Cpdr  (Omeprazole) .... Two Times A Day 4)  Sodium Bicarbonate 325 Mg Tabs (Sodium Bicarbonate) .... 2 Once Daily 5)  Tylenol Extra Strength 500 Mg Tabs (Acetaminophen) .... 2 Qid 6)  Calcium 500 Mg Tabs (Calcium Carbonate) .... Once Daily 7)  Multivitamins  Tabs (Multiple Vitamin) .... Once Daily 8)  Procrit 14782 Unit/ml Soln (Epoetin Alfa) .... Q 8 Weeks 9)  Coumadin 2.5 Mg Tabs (Warfarin Sodium) .... Once Daily 10)  Metoprolol Tartrate 25 Mg Tabs (Metoprolol Tartrate) .... Take 1 Tab Two Times A Day 11)  Oscal 500/200 D-3 500-200 Mg-Unit Tabs (Calcium-Vitamin D) .... Take 1 Tab Daily 12)  Iron Infusion .... Every Eight Weeks 13)  Furosemide 20 Mg Tabs (Furosemide) .... Take One Tablet By Mouth On Monday, Wednesday and Friday 14)  Lisinopril 10 Mg Tabs (Lisinopril) .... Take One Tablet By Mouth Daily  Allergies (verified): 1)  ! * Detrol 2)  Sulfa  Past History:  PMH, FH, and Social History reviewed and updated.  Family History: Father:deceased at age 78 due to coronary artery disease with onset in the form of myocardial infarction in his 15s Mother:deceased; developed colon cancer at an advanced age Son-Hodgkin's Lymphoma  Social History: Retired  Widowed  Tobacco Use - No.  Alcohol Use - no Regular Exercise - yes Drug Use - no Continues to drive a car without difficulty  Review of Systems       The patient complains of dyspnea on exertion and peripheral edema.  The patient denies anorexia, weight loss, weight gain, vision loss, decreased hearing, hoarseness, chest pain, syncope, prolonged cough, headaches, hemoptysis,  abdominal pain, and hematochezia.         Patient is very pleased with her pacemaker, which is abolished episodes of severe dizziness, syncope and falls; she experiences rare episodes of very mild lightheadedness.  All other systems reviewed and are negative.  Vital Signs:  Patient profile:   77 year old female Weight:      164 pounds Pulse rate:   86 /  minute BP sitting:   152 / 70  (right arm)  Vitals Entered By: [name] Saa, CNA (March 22, 2009 1:19 PM)  Physical Exam  General:    GENERAL:  very pleasant well oriented conversant well-groomed 77 year old lady resting comfortably LUNGS:  Mild-to-moderate kyphosis; clear lung fields. CARDIAC:  Normal first heart sounds; preserved aortic component of the second heart sound; grade 2/6 buzzing systolic ejection murmur at the cardiac base radiating to the carotids. ABDOMEN:  Soft and nontender; no organomegaly. EXTREMITIES: 2+ distal pulses on the right; 1+ on the left; 1+ ankle edema Neurologic: Normal cranial nerves; symmetric strength and tone. Skin: No lesions Psychiatric: Alert and oriented; normal affect.   PPM Specifications Following MD:  Lewayne Bunting, MD     PPM Vendor:  [employer]     PPM Model Number:  [id number]     PPM Serial Number:  [id number] PPM DOI:  11/28/2006     PPM Implanting MD:  Lewayne Bunting, MD  Lead 1    Location: [facility]     DOI: 11/18/2006     Model #: [id number]     Serial #: [id number]     Status: active Lead 2    Location: RV     DOI: 11/18/2006     Model #: [id number]     Serial #: [id number]     Status: active  Magnet Response Rate:  BOL 85 ERI  65  Indications:  Sick sinus syndrome   PPM Follow Up Pacer Dependent:  No      Episodes Coumadin:  Yes  Parameters Mode:  DDIR     Lower Rate Limit:  60     Upper Rate Limit:  130 Paced AV Delay:  150     Impression & Recommendations:  Problem # 1:  CHF (ICD-428.0) Symptoms suggest the recurrence of congestive heart failure although her exam is not impressive. There is no apparent reason why this should occur in the absence of a gain in weight.   We will proceed with a BNP level and a chest x-ray.  There are multiple other possible explanations for her symptoms including progression of anemia, underlying lung disease etc.  Only basic studies will be performed looking at these issues initially.She may need a repeat  echocardiogram and pulmonary function tests at some point.  Furosemide will be reintroduced at a dose of 20 mg t.i.w.  Renal function will be serially assessed due to her history of progressive renal insufficiency on low-dose furosemide.  Problem # 2:  SICK SINUS SYNDROME (ICD-427.81) Bradycardic symptoms have resolved with pacing.  Interestingly, atrial fibrillation has also apparently resolved.  We will continue to monitor during pacemaker assessments and possibly stop warfarin at some point in the future.  Problem # 3:  HYPERTENSION (ICD-401.9) Blood pressure control is suboptimal, especially in the setting of possible congestive heart failure.  Although she had normal left ventricular systolic function on her last echocardiogram, an ACE inhibitor will be added to her medical regime.  Electrolytes and renal function will be followed carefully.  I will plan to  reevaluate this nice woman in the office in 2 months.  She will be seen by the cardiology nurses in one month for clinical reassessment and reevaluation of blood pressure control.  Other Orders: T-Chest x-ray, 2 views ([id number]) T-TSH [id number]) T-Comprehensive Metabolic Panel [id number]) T-CBC w/Diff [id number]) T-BNP  (B Natriuretic Peptide) [id number]) Future Orders: T-Basic Metabolic Panel [id number]) ... 04/12/2009 T-Basic Metabolic Panel [id number]) ... 05/03/2009  Patient Instructions: 1)  Your physician recommends that you schedule a follow-up appointment in: 2 months 2)  Your physician recommends that you return for lab work in: TODAY  3 &6 weeks 3)  Your physician has recommended you make the following change in your medication: LASIX 20MG  MWF, LISINIOPRIL 10MG  DAILY 4)  A chest x-ray takes a picture of the organs and structures inside the chest, including the heart, lungs, and blood vessels. This test can show several things, including, whether the heart is enlarged; whether fluid is building up in the lungs; and  whether pacemaker / defibrillator leads are still in place. Prescriptions: LISINOPRIL 10 MG TABS (LISINOPRIL) Take one tablet by mouth daily  #30 x 6   Entered by:   Teressa Lower RN   Authorized by:   Kathlen Brunswick, MD, [facility]   Signed by:   Teressa Lower RN on 03/22/2009   Method used:   Electronically to        [facility] [phone]* (retail)       [address]       [place], [place]  [address]       Ph: [phone] or [phone]       Fax: [phone]   RxID:   [id number] FUROSEMIDE 20 MG TABS (FUROSEMIDE) Take one tablet by mouth on Monday, Wednesday and Friday  #15 x 6   Entered by:   Teressa Lower RN   Authorized by:   Kathlen Brunswick, MD, [facility]   Signed by:   Teressa Lower RN on 03/22/2009   Method used:   Electronically to        [facility] [phone]* (retail)       [address]       [place], [place]  [address]       Ph: [phone] or [phone]       Fax: [phone]   RxID:   [id number]

## 2010-04-17 NOTE — Medication Information (Signed)
Summary: ccr-lr  Anticoagulant Therapy  Managed by: Vashti Hey, RN PCP: Dr. Hoover Browns MD: Dietrich Pates MD, Molly Maduro Indication 1: Atrial Fibrillation (ICD-427.31) Lab Used: [facility] [place] Site: [place] INR POC 2.4  Dietary changes: no    Health status changes: no    Bleeding/hemorrhagic complications: no    Recent/future hospitalizations: no    Any changes in medication regimen? no    Recent/future dental: no  Any missed doses?: no       Is patient compliant with meds? yes       Allergies: 1)  ! * Detrol 2)  Sulfa  Anticoagulation Management History:      The patient is taking warfarin and comes in today for a routine follow up visit.  Positive risk factors for bleeding include an age of 75 years or older.  Negative risk factors for bleeding include no history of CVA/TIA.  The bleeding index is 'intermediate risk'.  Positive CHADS2 values include History of HTN and Age > 75 years old.  Negative CHADS2 values include History of CHF, History of Diabetes, and Prior Stroke/CVA/TIA.  The start date was 10/03/2006.  Anticoagulation responsible provider: Dietrich Pates MD, Molly Maduro.  INR POC: 2.4.  Cuvette Lot#: [id number].  Exp: 10/11.    Anticoagulation Management Assessment/Plan:      The patient's current anticoagulation dose is Warfarin sodium 5 mg tabs: Take as directed by [facility].  The target INR is 2 - 2.5.  The next INR is due 02/17/2010.  Anticoagulation instructions were given to patient.  Results were reviewed/authorized by Vashti Hey, RN.  She was notified by Vashti Hey RN.         Prior Anticoagulation Instructions: INR 2.5 Continue coumadin 2.5mg  once daily   Current Anticoagulation Instructions: INR 2.4 Continue coumadin 2.5mg  once daily

## 2010-04-17 NOTE — Miscellaneous (Signed)
Summary: labs bmp,03/22/2009,05/03/2009  Clinical Lists Changes  Observations: Added new observation of CALCIUM: 9.1 mg/dL (16/12/9602 54:09) Added new observation of CREATININE: 1.28 mg/dL (81/19/1478 29:56) Added new observation of BUN: 27 mg/dL (21/30/8657 84:69) Added new observation of BG RANDOM: 93 mg/dL (62/95/2841 32:44) Added new observation of CO2 PLSM/SER: 26 meq/L (05/03/2009 11:47) Added new observation of CL SERUM: 106 meq/L (05/03/2009 11:47) Added new observation of K SERUM: 5.1 meq/L (05/03/2009 11:47) Added new observation of NA: 144 meq/L (05/03/2009 11:47) Added new observation of CALCIUM: 9.3 mg/dL (03/18/7251 66:44) Added new observation of ALBUMIN: 4.2 g/dL (03/47/4259 56:38) Added new observation of PROTEIN, TOT: 6.6 g/dL (75/64/3329 51:88) Added new observation of SGPT (ALT): 12 units/L (03/22/2009 11:47) Added new observation of SGOT (AST): 19 units/L (03/22/2009 11:47) Added new observation of ALK PHOS: 77 units/L (03/22/2009 11:47) Added new observation of CREATININE: 1.05 mg/dL (41/66/0630 16:01) Added new observation of BUN: 25 mg/dL (09/32/3557 32:20) Added new observation of BG RANDOM: 91 mg/dL (25/42/7062 37:62) Added new observation of CO2 PLSM/SER: 26 meq/L (03/22/2009 11:47) Added new observation of CL SERUM: 106 meq/L (03/22/2009 11:47) Added new observation of K SERUM: 4.8 meq/L (03/22/2009 11:47) Added new observation of NA: 143 meq/L (03/22/2009 11:47)

## 2010-04-17 NOTE — Miscellaneous (Signed)
**Note De-Identified Caspian Deleonardis Obfuscation** Summary: Orders: BMP  Clinical Lists Changes  Orders: Added new Test order of T-Basic Metabolic Panel [id number]) - Signed

## 2010-04-17 NOTE — Miscellaneous (Signed)
Summary: LABS CBCD,BMP,05/21/2009 BMP-07/01/2009  Clinical Lists Changes  Observations: Added new observation of CALCIUM: 9.4 mg/dL (69/62/9528 41:32) Added new observation of CREATININE: 1.06 mg/dL (44/03/270 53:66) Added new observation of BUN: 23 mg/dL (44/05/4740 59:56) Added new observation of BG RANDOM: 123 mg/dL (38/75/6433 29:51) Added new observation of CO2 PLSM/SER: 27 meq/L (07/01/2009 16:12) Added new observation of CL SERUM: 106 meq/L (07/01/2009 16:12) Added new observation of K SERUM: 4.4 meq/L (07/01/2009 16:12) Added new observation of NA: 141 meq/L (07/01/2009 16:12) Added new observation of CALCIUM: 9.1 mg/dL (88/41/6606 30:16) Added new observation of CREATININE: 1.15 mg/dL (03/24/3233 57:32) Added new observation of BUN: 25 mg/dL (20/25/4270 62:37) Added new observation of BG RANDOM: 116 mg/dL (62/83/1517 61:60) Added new observation of CO2 PLSM/SER: 28 meq/L (05/21/2009 16:12) Added new observation of CL SERUM: 105 meq/L (05/21/2009 16:12) Added new observation of K SERUM: 4.5 meq/L (05/21/2009 16:12) Added new observation of NA: 142 meq/L (05/21/2009 16:12) Added new observation of PLATELETK/UL: 231 K/uL (05/21/2009 16:12) Added new observation of MCV: 100.0 fL (05/21/2009 16:12) Added new observation of HCT: 39.0 % (05/21/2009 16:12) Added new observation of HGB: 11.6 g/dL (73/71/0626 94:85) Added new observation of WBC COUNT: 14.7 10*3/microliter (05/21/2009 16:12)

## 2010-04-17 NOTE — Medication Information (Signed)
Summary: ccr-lr  Anticoagulant Therapy  Managed by: Vashti Hey, RN PCP: Dr. Hoover Browns MD: Diona Browner MD, Remi Deter Indication 1: Atrial Fibrillation (ICD-427.31) Lab Used: [facility] [place] Site: [place] INR POC 2.3  Dietary changes: no    Health status changes: no    Bleeding/hemorrhagic complications: no    Recent/future hospitalizations: no    Any changes in medication regimen? no    Recent/future dental: no  Any missed doses?: yes     Details: Missed 1 dose due to nose bleed  Is patient compliant with meds? yes       Allergies: 1)  ! * Detrol 2)  Sulfa  Anticoagulation Management History:      The patient is taking warfarin and comes in today for a routine follow up visit.  Positive risk factors for bleeding include an age of 75 years or older.  Negative risk factors for bleeding include no history of CVA/TIA.  The bleeding index is 'intermediate risk'.  Positive CHADS2 values include History of HTN and Age > 53 years old.  Negative CHADS2 values include History of CHF, History of Diabetes, and Prior Stroke/CVA/TIA.  The start date was 10/03/2006.  Anticoagulation responsible provider: Diona Browner MD, Remi Deter.  INR POC: 2.3.  Cuvette Lot#: [id number].  Exp: 10/11.    Anticoagulation Management Assessment/Plan:      The patient's current anticoagulation dose is Warfarin sodium 5 mg tabs: Take as directed by [facility].  The target INR is 2 - 2.5.  The next INR is due 04/30/2010.  Anticoagulation instructions were given to patient.  Results were reviewed/authorized by Vashti Hey, RN.  She was notified by Vashti Hey RN.         Prior Anticoagulation Instructions: INR 2.9 Take 1/4 tablet tonight then resume 1/2 tablet once daily   Current Anticoagulation Instructions: INR 2.3 Continue coumadin 2.5mg  once daily

## 2010-04-17 NOTE — Letter (Signed)
Summary: [place] [facility], [facility] at [employer]  [address], [place] [address]   Phone: [phone]  Fax: [phone]     March 22, 2009 MRN: [id number]   [name] [address] [place], [place]  [address]      YOUR LAB WORK IS DUE  April 12, 2009 _________________________________________  Please go to [facility], located across the street from [facility] on the second floor.  Hours are Monday - Friday 7am until 7:30pm         Saturday 8am until 12noon    __  DO NOT EAT OR DRINK AFTER MIDNIGHT EVENING PRIOR TO LABWORK  _X_ YOUR LABWORK IS NOT FASTING --YOU MAY EAT PRIOR TO LABWORK

## 2010-04-17 NOTE — Progress Notes (Signed)
Summary: Refill on Warfarin  Medications Added WARFARIN SODIUM 5 MG TABS (WARFARIN SODIUM) Take as directed by [facility]madin Clinic       Phone Note Call from Patient   Caller: Patient Reason for Call: Refill Medication Summary of Call: Needs refill for Warfarin 5mg  called to [facility] in RDS/tg Initial call taken by: Raechel Ache [facility],  May 09, 2009 10:57 AM    New/Updated Medications: WARFARIN SODIUM 5 MG TABS (WARFARIN SODIUM) Take as directed by [facility]madin Clinic Prescriptions: WARFARIN SODIUM 5 MG TABS (WARFARIN SODIUM) Take as directed by Coumadin Clinic  #45 x 3   Entered by:   Larita Fife Via LPN   Authorized by:   Kathlen Brunswick, MD, [facility]   Signed by:   Larita Fife Via LPN on 16/12/9602   Method used:   Electronically to        [facility] [phone]* (retail)       [address]       [place], [place]  [address]       Ph: [phone] or [phone]       Fax: [phone]   RxID:   [id number]

## 2010-04-17 NOTE — Miscellaneous (Signed)
Summary: labs cmp,lipids,liver 05/29/2008  Clinical Lists Changes  Observations: Added new observation of CALCIUM: 9.3 mg/dL (04/54/0981 19:14) Added new observation of ALBUMIN: 4.0 g/dL (78/29/5621 30:86) Added new observation of PROTEIN, TOT: 6.6 g/dL (57/84/6962 95:28) Added new observation of SGPT (ALT): 19 units/L (05/29/2008 16:48) Added new observation of SGOT (AST): 23 units/L (05/29/2008 16:48) Added new observation of ALK PHOS: 88 units/L (05/29/2008 16:48) Added new observation of CREATININE: 1.37 mg/dL (41/32/4401 02:72) Added new observation of BUN: 40 mg/dL (53/66/4403 47:42) Added new observation of BG RANDOM: 110 mg/dL (59/56/3875 64:33) Added new observation of CO2 PLSM/SER: 25 meq/L (05/29/2008 16:48) Added new observation of CL SERUM: 107 meq/L (05/29/2008 16:48) Added new observation of K SERUM: 4.8 meq/L (05/29/2008 16:48) Added new observation of NA: 142 meq/L (05/29/2008 16:48) Added new observation of LDL: 43 mg/dL (29/51/8841 66:06) Added new observation of HDL: 26 mg/dL (30/16/0109 32:35) Added new observation of TRIGLYC TOT: 259 mg/dL (57/32/2025 42:70) Added new observation of CHOLESTEROL: 121 mg/dL (62/37/6283 15:17) Added new observation of ALBUMIN: 4.2 g/dL (61/60/7371 06:26) Added new observation of PROTEIN, TOT: 7.3 g/dL (94/85/4627 03:50) Added new observation of SGPT (ALT): 17 units/L (09/13/2001 16:48) Added new observation of SGOT (AST): 20 units/L (09/13/2001 16:48) Added new observation of ALK PHOS: 95 units/L (09/13/2001 16:48) Added new observation of BILI DIRECT: 0.1 mg/dL (09/38/1829 93:71) Added new observation of LDL: 66 mg/dL (69/67/8938 10:17) Added new observation of HDL: 38 mg/dL (51/04/5850 77:82) Added new observation of TRIGLYC TOT: 266 mg/dL (42/35/3614 43:15) Added new observation of CHOLESTEROL: 157 mg/dL (40/10/6759 95:09) Added new observation of TSH: 2.786 microintl units/mL (09/13/2001 16:48)

## 2010-04-17 NOTE — Medication Information (Signed)
Summary: ccr-lr  Anticoagulant Therapy  Managed by: Vashti Hey, RN PCP: Dr. Hoover Browns MD: Daleen Squibb MD, Maisie Fus Indication 1: Atrial Fibrillation (ICD-427.31) Lab Used: [facility] [place] Site: [place] INR POC 1.2  Dietary changes: no    Health status changes: no    Bleeding/hemorrhagic complications: no    Recent/future hospitalizations: no    Any changes in medication regimen? yes       Details: Started on Abx 1 tid x 2 weeks ago.  Per Dr Karilyn Cota for stomach  Has 2 days left   She 's not sure of the Name   Recent/future dental: no  Any missed doses?: yes     Details: States Dr Page Spiro told her to take 1/2 of her coumadin dose while on Abx  Is patient compliant with meds? yes       Allergies: 1)  ! * Detrol 2)  Sulfa  Anticoagulation Management History:      The patient is taking warfarin and comes in today for a routine follow up visit.  Positive risk factors for bleeding include an age of 75 years or older.  Negative risk factors for bleeding include no history of CVA/TIA.  The bleeding index is 'intermediate risk'.  Positive CHADS2 values include History of HTN and Age > 32 years old.  Negative CHADS2 values include History of CHF, History of Diabetes, and Prior Stroke/CVA/TIA.  The start date was 10/03/2006.  Anticoagulation responsible Shilee Biggs: Daleen Squibb MD, Maisie Fus.  INR POC: 1.2.  Cuvette Lot#: [id number].  Exp: 10/11.    Anticoagulation Management Assessment/Plan:      The patient's current anticoagulation dose is Warfarin sodium 5 mg tabs: Take as directed by [facility] Clinic.  The target INR is 2 - 2.5.  The next INR is due 02/27/2010.  Anticoagulation instructions were given to patient.  Results were reviewed/authorized by Vashti Hey, RN.  She was notified by Vashti Hey RN.         Prior Anticoagulation Instructions: INR 2.4 Continue coumadin 2.5mg  once daily   Current Anticoagulation Instructions: INR 1.2 Has been on 1/2 coumadin dose  per Dr Karilyn Cota due to Antibiotic.  She has 2 days left. Take coumadin 1 tablet tonight then resume 1/2 tablet once daily

## 2010-04-17 NOTE — Letter (Signed)
Summary: [place] [facility] [employer] [facility], [employer] at [employer]  [address], [place] [address]   Phone: [phone]  Fax: [phone]     August 22, 2009 MRN: [id number]   [name] [address] [place], [place]  [address]      YOUR LAB WORK IS DUE   MONDAY September 25, 2009 Please go to [facility], located across the street from [facility] on the second floor.  Hours are Monday - Friday 7am until 7:30pm         Saturday 8am until 12noon    _X_  DO NOT EAT OR DRINK AFTER MIDNIGHT EVENING PRIOR TO LABWORK  __ YOUR LABWORK IS NOT FASTING --YOU MAY EAT PRIOR TO LABWORK

## 2010-04-17 NOTE — Medication Information (Signed)
Summary: ccr-lr  Anticoagulant Therapy  Managed by: Vashti Hey, RN PCP: Dr. Hoover Browns MD: Dietrich Pates MD, Molly Maduro Indication 1: Atrial Fibrillation (ICD-427.31) Lab Used: [facility] [place] Site: [place] INR POC 3.4  Dietary changes: no    Health status changes: no    Bleeding/hemorrhagic complications: no    Recent/future hospitalizations: no    Any changes in medication regimen? no    Recent/future dental: no  Any missed doses?: no       Is patient compliant with meds? yes       Allergies: 1)  ! * Detrol 2)  Sulfa  Anticoagulation Management History:      The patient is taking warfarin and comes in today for a routine follow up visit.  Positive risk factors for bleeding include an age of 5 years or older.  Negative risk factors for bleeding include no history of CVA/TIA.  The bleeding index is 'intermediate risk'.  Positive CHADS2 values include History of CHF, History of HTN, and Age > 48 years old.  Negative CHADS2 values include History of Diabetes and Prior Stroke/CVA/TIA.  The start date was 10/03/2006.  Anticoagulation responsible provider: Dietrich Pates MD, Molly Maduro.  INR POC: 3.4.  Cuvette Lot#: [id number].  Exp: 10/11.    Anticoagulation Management Assessment/Plan:      The patient's current anticoagulation dose is Coumadin 2.5 mg tabs: once daily.  The target INR is 2 - 2.5.  The next INR is due 04/29/2009.  Anticoagulation instructions were given to patient.  Results were reviewed/authorized by Vashti Hey, RN.  She was notified by Vashti Hey RN.         Prior Anticoagulation Instructions: INR 2.1 Continue coumadin 2.5mg  once daily exept 5mg  on Fridays  Current Anticoagulation Instructions: INR 3.4 Decrease coumadin to 2.5mg  once daily

## 2010-04-17 NOTE — Medication Information (Signed)
Summary: protime/tg  Anticoagulant Therapy  Managed by: Vashti Hey, RN PCP: Dr. Hoover Browns MD: Dietrich Pates MD, Molly Maduro Indication 1: Atrial Fibrillation (ICD-427.31) Lab Used: [facility] [place] Site: [place] INR POC 2.9  Dietary changes: no    Health status changes: no    Bleeding/hemorrhagic complications: no    Recent/future hospitalizations: no    Any changes in medication regimen? no    Recent/future dental: no  Any missed doses?: no       Is patient compliant with meds? yes       Allergies: 1)  ! * Detrol 2)  Sulfa  Anticoagulation Management History:      The patient is taking warfarin and comes in today for a routine follow up visit.  Positive risk factors for bleeding include an age of 95 years or older.  Negative risk factors for bleeding include no history of CVA/TIA.  The bleeding index is 'intermediate risk'.  Positive CHADS2 values include History of HTN and Age > 30 years old.  Negative CHADS2 values include History of CHF, History of Diabetes, and Prior Stroke/CVA/TIA.  The start date was 10/03/2006.  Anticoagulation responsible Riah Kehoe: Dietrich Pates MD, Molly Maduro.  INR POC: 2.9.  Cuvette Lot#: [id number].  Exp: 10/11.    Anticoagulation Management Assessment/Plan:      The patient's current anticoagulation dose is Warfarin sodium 5 mg tabs: Take as directed by Coumadin Clinic.  The target INR is 2 - 2.5.  The next INR is due 04/02/2010.  Anticoagulation instructions were given to patient.  Results were reviewed/authorized by Vashti Hey, RN.  She was notified by Vashti Hey RN.         Prior Anticoagulation Instructions: INR 1.2 Has been on 1/2 coumadin dose per Dr Karilyn Cota due to Antibiotic.  She has 2 days left. Take coumadin 1 tablet tonight then resume 1/2 tablet once daily   Current Anticoagulation Instructions: INR 2.9 Take 1/4 tablet tonight then resume 1/2 tablet once daily

## 2010-04-17 NOTE — Letter (Signed)
Summary: [facility] PROGRESS NOTE 04-26-09  [facility] PROGRESS NOTE 04-26-09   Imported By: Faythe Ghee 08/23/2009 10:37:32  _____________________________________________________________________  External Attachment:    Type:   Image     Comment:   External Document

## 2010-04-17 NOTE — Medication Information (Signed)
Summary: ccr-lr  Anticoagulant Therapy  Managed by: Vashti Hey, RN PCP: Dr. Hoover Browns MD: Dietrich Pates MD, Molly Maduro Indication 1: Atrial Fibrillation (ICD-427.31) Lab Used: [facility] [place] Site: [place] INR POC 2.8  Dietary changes: no    Health status changes: no    Bleeding/hemorrhagic complications: no    Recent/future hospitalizations: no    Any changes in medication regimen? no    Recent/future dental: no  Any missed doses?: no       Is patient compliant with meds? yes       Allergies: 1)  ! * Detrol 2)  Sulfa  Anticoagulation Management History:      The patient is taking warfarin and comes in today for a routine follow up visit.  Positive risk factors for bleeding include an age of 18 years or older.  Negative risk factors for bleeding include no history of CVA/TIA.  The bleeding index is 'intermediate risk'.  Positive CHADS2 values include History of HTN and Age > 14 years old.  Negative CHADS2 values include History of CHF, History of Diabetes, and Prior Stroke/CVA/TIA.  The start date was 10/03/2006.  Anticoagulation responsible provider: Dietrich Pates MD, Molly Maduro.  INR POC: 2.8.  Cuvette Lot#: [id number].  Exp: 10/11.    Anticoagulation Management Assessment/Plan:      The patient's current anticoagulation dose is Warfarin sodium 5 mg tabs: Take as directed by Coumadin Clinic.  The target INR is 2 - 2.5.  The next INR is due 12/09/2009.  Anticoagulation instructions were given to patient.  Results were reviewed/authorized by Vashti Hey, RN.  She was notified by Vashti Hey RN.         Prior Anticoagulation Instructions: INR 2.4 Continue coumadin 2.5mg  once daily   Current Anticoagulation Instructions: INR 2.8 Take coumadin 1/4 tablet tonight then resume 1/2 tablet once daily

## 2010-04-17 NOTE — Letter (Signed)
**Note [name]-Identified via Obfuscation** Summary: [place] [facility] [employer] [facility], [employer] at [employer]  [address] [place] [address]   Phone: [phone]  Fax: [phone]     May 06, 2009 MRN: [id number]   DEMECIA [name] [address] [place], [place]  [address]      YOUR LAB WORK IS DUE  July 01, 2009 _________________________________________  Please go to [facility], located across the street from [facility] on the second floor.  Hours are Monday - Friday 7am until 7:30pm         Saturday 8am until 12noon    __  DO NOT EAT OR DRINK AFTER MIDNIGHT EVENING PRIOR TO LABWORK  _X_ YOUR LABWORK IS NOT FASTING --YOU MAY EAT PRIOR TO LABWORK

## 2010-04-17 NOTE — Letter (Signed)
Summary: OFFICE NOTE/[facility]  OFFICE NOTE/[facility]   Imported By: Diana Eves 03/28/2009 09:38:02  _____________________________________________________________________  External Attachment:    Type:   Image     Comment:   External Document

## 2010-04-17 NOTE — Miscellaneous (Signed)
Summary: labs bmp,07/01/2009  Clinical Lists Changes  Observations: Added new observation of CALCIUM: 9.4 mg/dL (16/12/9602 54:09) Added new observation of CREATININE: 1.06 mg/dL (81/19/1478 29:56) Added new observation of BUN: 23 mg/dL (21/30/8657 84:69) Added new observation of BG RANDOM: 123 mg/dL (62/95/2841 32:44) Added new observation of CO2 PLSM/SER: 27 meq/L (07/01/2009 16:07) Added new observation of CL SERUM: 106 meq/L (07/01/2009 16:07) Added new observation of K SERUM: 4.4 meq/L (07/01/2009 16:07) Added new observation of NA: 141 meq/L (07/01/2009 16:07)

## 2010-04-17 NOTE — Letter (Signed)
Summary: Recall Colonoscopy/Endoscopy, Change to Office Visit  [facility]  [address]   [place], [place] [address]   Phone: [phone]  Fax: [phone]      September 06, 2009   [name] [address] [place], [place]  [address] January 06, 1916   Dear Ms. Veto Kemps,   According to our records, it is time for you to schedule a Colonoscopy/Endoscopy. However, after reviewing your medical record, we recommend an office visit in order to determine your need for a repeat procedure.  Please call [phone] at your convenience to schedule an office visit. If you have any questions or concerns, please feel free to contact our office.   Sincerely,   Cloria Spring LPN  [facility] Ph: [phone]   Fax: [phone]

## 2010-04-17 NOTE — Medication Information (Signed)
Summary: ccr-lr  Anticoagulant Therapy  Managed by: Vashti Hey, RN PCP: Dr. Hoover Browns MD: Dietrich Pates MD, Molly Maduro Indication 1: Atrial Fibrillation (ICD-427.31) Lab Used: [facility] [place] Site: [place] INR POC 1.6  Dietary changes: no    Health status changes: no    Bleeding/hemorrhagic complications: no    Recent/future hospitalizations: no    Any changes in medication regimen? no    Recent/future dental: no  Any missed doses?: no       Is patient compliant with meds? yes       Allergies: 1)  ! * Detrol 2)  Sulfa  Anticoagulation Management History:      The patient is taking warfarin and comes in today for a routine follow up visit.  Positive risk factors for bleeding include an age of 75 years or older.  Negative risk factors for bleeding include no history of CVA/TIA.  The bleeding index is 'intermediate risk'.  Positive CHADS2 values include History of CHF, History of HTN, and Age > 59 years old.  Negative CHADS2 values include History of Diabetes and Prior Stroke/CVA/TIA.  The start date was 10/03/2006.  Anticoagulation responsible provider: Dietrich Pates MD, Molly Maduro.  INR POC: 1.6.  Cuvette Lot#: [id number].  Exp: 10/11.    Anticoagulation Management Assessment/Plan:      The patient's current anticoagulation dose is Warfarin sodium 5 mg tabs: Take as directed by Coumadin Clinic.  The target INR is 2 - 2.5.  The next INR is due 07/29/2009.  Anticoagulation instructions were given to patient.  Results were reviewed/authorized by Vashti Hey, RN.  She was notified by Vashti Hey RN.         Prior Anticoagulation Instructions: INR 1.8 Take coumadin 3/4 tablet tonight then increase dose to 1/2 tablet once daily except 1/4 tablet on Fridays  Current Anticoagulation Instructions: INR 1.6 Take coumadin 1 tablet tonight then increase dose to 1/2 tablet once daily

## 2010-04-17 NOTE — Assessment & Plan Note (Signed)
Summary: ROV  Medications Added PRILOSEC 20 MG CPDR (OMEPRAZOLE) take 1 tab two times a day FUROSEMIDE 20 MG TABS (FUROSEMIDE) Take one tablet by mouth on Monday, Wednesday and Friday      Allergies Added:   Visit Type:  Follow-up Referring Provider:  Dr. Benard Rink Primary Provider:  Dr. Juanetta Gosling  CC:  no complaints today.  History of Present Illness: [name] is a very pleasant 77 y/o CF looking younger that stated age who is here for follow-up after seeing Dr. Dietrich Pates on 03/22/2009.  She has a history of SSS, mild valvular heart disease, CHF with preserved CHF.  She was placed on Lasix 3 times a week on last visit for fluid retention and has lost 4 lbs since that visit.  She says her breathing status has improved somewhat.  Follow-up labs report a Creat 1.28 and K+ of 5.1.  She continues on lisinopril, verapamil along with metoprolol.  She has normal systolic fx.  She is on coumadin for paroxysmal afib.  She would like to stop taking the Lasix because it causes her to have frequent urination when she takes it, but it has helped her consideralby and she is more active.  She has a history of anemia and is followed by Dr. Mariel Sleet.  She was intolerant to Iron infusion causing SOB and severe pressure in her head.  Current Medications (verified): 1)  Simvastatin 20 Mg Tabs (Simvastatin) .... Once Daily 2)  Verapamil Hcl Cr 240 Mg Xr24h-Cap (Verapamil Hcl) .... Take 1 Tablet By Mouth Once A Day 3)  Prilosec 20 Mg Cpdr (Omeprazole) .... Take 1 Tab Two Times A Day 4)  Tylenol Extra Strength 500 Mg Tabs (Acetaminophen) .... 2 Qid 5)  Calcium 500 Mg Tabs (Calcium Carbonate) .... Once Daily 6)  Multivitamins  Tabs (Multiple Vitamin) .... Once Daily 7)  Procrit 03474 Unit/ml Soln (Epoetin Alfa) .... Q 8 Weeks 8)  Warfarin Sodium 5 Mg Tabs (Warfarin Sodium) .... Take As Directed By Coumadin Clinic 9)  Metoprolol Tartrate 25 Mg Tabs (Metoprolol Tartrate) .... Take 1 Tab Two Times A Day 10)  Oscal  500/200 D-3 500-200 Mg-Unit Tabs (Calcium-Vitamin D) .... Take 1 Tab Daily 11)  Iron Infusion .... Every Eight Weeks 12)  Furosemide 20 Mg Tabs (Furosemide) .... Take One Tablet By Mouth On Monday, Wednesday and Friday 13)  Lisinopril 10 Mg Tabs (Lisinopril) .... Take One Tablet By Mouth Daily  Allergies (verified): 1)  ! * Detrol 2)  Sulfa  Past History:  Past medical, surgical, family and social histories (including risk factors) reviewed, and no changes noted (except as noted below).  Past Medical History: Reviewed history from 03/22/2009 and no changes required. Sick sinus syndrome with paroxysmal atrial fibrillation; dual-chamber pacemaker in 11/2006 Aortic stenosis-mild Left bundle branch block Congestive heart failure with normal ejection fraction ASCVD-coronary angio in 10/09:50% left anterior descending; 80% PDA; medical therapy advised Orthostatic change in blood pressure Epistaxis-mild with negative ENT evaluation RENAL DISEASE, CHRONIC (ICD-593.9): Creatinine of 1.7 in 8/08, 1.4 in 10/08 and 1.5 in 11/09 DIZZINESS, CHRONIC (ICD-780.4) ASTHMA (ICD-493.90) ROSACEA (ICD-695.3) Osteoarthritis Irritable bowel syndrome Diverticulosis Chronic lymphocytic leukemia with anemia H./H. of 9/27 in 11/09 with normal MCV; iron deficiency in bone       marrow GERD (ICD-530.81)  ASCITES, MALIGNANT (ICD-789.51) PAROXYSMAL ATRIAL FIBRILLATION (ICD-427.31) Leukemia chronic lymphocytic iron infusions  Past Surgical History: Reviewed history from 03/22/2009 and no changes required. Cholecystectomy C-spine LS spine procedure Bilateral cataract surgery Bladder resuspension Hysterectomy-1972 Dual-chamber placed maker  implantation-9/08    Family History: Reviewed history from 03/22/2009 and no changes required. Father:deceased at age 19 due to coronary artery disease with onset in the form of myocardial infarction in his 16s Mother:deceased; developed colon cancer at an advanced  age Son-Hodgkin's Lymphoma  Social History: Reviewed history from 03/22/2009 and no changes required. Retired  Widowed  Tobacco Use - No.  Alcohol Use - no Regular Exercise - yes Drug Use - no Continues to drive a car without difficulty  Review of Systems       All other systems have been reviewed and are negative unless stated above.   Vital Signs:  Patient profile:   77 year old female Weight:      160 pounds Pulse rate:   92 / minute BP sitting:   153 / 73  (right arm)  Vitals Entered By: Dreama Saa, CNA (May 20, 2009 1:54 PM)  Physical Exam  General:  Well developed, well nourished, in no acute distress. Lungs:  Clear bilaterally to auscultation and percussion. Some bilaterally. Heart:  Grade  2/6 SEM loudest primary aortic area radiation to R carotid. Abdomen:  Bowel sounds positive; abdomen soft and non-tender without masses, organomegaly, or hernias noted. No hepatosplenomegaly. Extremities:  Mild bilateral edema R>L Skin:  Intact without lesions or rashes. Psych:  Normal affect.   EKG  Procedure date:  05/20/2009  Findings:       Atrium and ventricle are paced.  90bpm  PPM Specifications Following MD:  Lewayne Bunting, MD     PPM Vendor:  [employer]     PPM Model Number:  [id number]     PPM Serial Number:  [id number] PPM DOI:  11/28/2006     PPM Implanting MD:  Lewayne Bunting, MD  Lead 1    Location: [facility]     DOI: 11/18/2006     Model #: 4076     Serial #: [id number]     Status: active Lead 2    Location: RV     DOI: 11/18/2006     Model #: [id number]     Serial #: [id number]     Status: active  Magnet Response Rate:  BOL 85 ERI  65  Indications:  Sick sinus syndrome   PPM Follow Up Pacer Dependent:  No      Episodes Coumadin:  Yes  Parameters Mode:  DDIR     Lower Rate Limit:  60     Upper Rate Limit:  130 Paced AV Delay:  150     Impression & Recommendations:  Problem # 1:  RENAL INSUFFICIENCY (ICD-588.9) Will follow-up with BMET. If potassium remains  elevated, will stop the lisinopril and increase the verapamil for better BP control. or increase the metoprolol as her HR is in the 90's here today. This can be reassessed on next visit.  She wants to stop the lasix, but this is helping her with fluid retention with wt 160lbs, down 4 lbs from last visit, with improved breathing. Orders: T-Basic Metabolic Panel [phone])  Problem # 2:  COUMADIN THERAPY (ICD-V58.61) Assessment: Unchanged  Problem # 3:  ANEMIA (ICD-285.9) Follow-up CBC for evaluation of status.  With renal insufficiency, I will follow this closely. Orders: T-CBC w/Diff [phone])  Patient Instructions: 1)  Your physician recommends that you schedule a follow-up appointment in: 3 months 2)  Your physician recommends that you return for lab work TODAY 3)  Your physician recommends that you continue on your current medications as directed. Please refer  to the Current Medication list given to you today.

## 2010-04-17 NOTE — Medication Information (Signed)
Summary: ccr-lr  Anticoagulant Therapy  Managed by: [name] Hey, RN PCP: Dr. Hoover Browns MD: Dietrich Pates MD, Molly Maduro Indication 1: Atrial Fibrillation (ICD-427.31) Lab Used: [facility] [place] Site: [place] INR POC 2.2  Dietary changes: no    Health status changes: no    Bleeding/hemorrhagic complications: no    Recent/future hospitalizations: no    Any changes in medication regimen? no    Recent/future dental: no  Any missed doses?: no       Is patient compliant with meds? yes       Allergies: 1)  ! * Detrol 2)  Sulfa  Anticoagulation Management History:      The patient is taking warfarin and comes in today for a routine follow up visit.  Positive risk factors for bleeding include an age of 75 years or older.  Negative risk factors for bleeding include no history of CVA/TIA.  The bleeding index is 'intermediate risk'.  Positive CHADS2 values include History of CHF, History of HTN, and Age > 72 years old.  Negative CHADS2 values include History of Diabetes and Prior Stroke/CVA/TIA.  The start date was 10/03/2006.  Anticoagulation responsible provider: Dietrich Pates MD, Molly Maduro.  INR POC: 2.2.  Cuvette Lot#: [id number].  Exp: 10/11.    Anticoagulation Management Assessment/Plan:      The patient's current anticoagulation dose is Warfarin sodium 5 mg tabs: Take as directed by Coumadin Clinic.  The target INR is 2 - 2.5.  The next INR is due 08/26/2009.  Anticoagulation instructions were given to patient.  Results were reviewed/authorized by [name] Hey, RN.  She was notified by [name] Hey RN.         Prior Anticoagulation Instructions: INR 1.6 Take coumadin 1 tablet tonight then increase dose to 1/2 tablet once daily   Current Anticoagulation Instructions: INR 2.2 Continue coumadin 2.5mg  once daily

## 2010-04-17 NOTE — Letter (Signed)
Summary: [place] [facility], agricultural at [facility]  [address] [place] [address]   Phone: [phone]  Fax: [phone]      July 04, 2009 MRN: [id number]   [name] [address] [place], [place]  [address]   Dear Ms. Veto Kemps,  Your test ordered by Selena Batten has been reviewed by your physician (or physician assistant) and was found to be normal or stable. Your physician (or physician assistant) felt no changes were needed at this time.  ____ Echocardiogram  ____ Cardiac Stress Test  __x__ Lab Work  ____ Peripheral vascular study of arms, legs or neck  ____ CT scan or X-ray  ____ Lung or Breathing test  ____ Other:  No change in medical treatment at this time, per Dr. Dietrich Pates.  Enclosed is a copy of your labwork for your records.  Thank you, Carlye Panameno Allyne Gee RN    Otero Bing, MD, Lenise Arena.C.Gaylord Shih, MD, F.A.C.C Lewayne Bunting, MD, F.A.C.C Nona Dell, MD, F.A.C.C Charlton Haws, MD, Lenise Arena.C.C

## 2010-04-17 NOTE — Letter (Signed)
Summary: [facility] 09-10-09 PROGRESS NOTE  [facility] 09-10-09 PROGRESS NOTE   Imported By: Faythe Ghee 09/27/2009 12:41:19  _____________________________________________________________________  External Attachment:    Type:   Image     Comment:   External Document

## 2010-04-17 NOTE — Medication Information (Signed)
Summary: ccr-lr  Anticoagulant Therapy  Managed by: [name] Hey, RN PCP: Dr. Hoover Browns MD: Dietrich Pates MD, Molly Maduro Indication 1: Atrial Fibrillation (ICD-427.31) Lab Used: [facility] [place] Site: [place] INR POC 2.5  Dietary changes: no    Health status changes: no    Bleeding/hemorrhagic complications: no    Recent/future hospitalizations: no    Any changes in medication regimen? no    Recent/future dental: no  Any missed doses?: no       Is patient compliant with meds? yes       Allergies: 1)  ! * Detrol 2)  Sulfa  Anticoagulation Management History:      The patient is taking warfarin and comes in today for a routine follow up visit.  Positive risk factors for bleeding include an age of 75 years or older.  Negative risk factors for bleeding include no history of CVA/TIA.  The bleeding index is 'intermediate risk'.  Positive CHADS2 values include History of HTN and Age > 32 years old.  Negative CHADS2 values include History of CHF, History of Diabetes, and Prior Stroke/CVA/TIA.  The start date was 10/03/2006.  Anticoagulation responsible provider: Dietrich Pates MD, Molly Maduro.  INR POC: 2.5.  Cuvette Lot#: [id number].  Exp: 10/11.    Anticoagulation Management Assessment/Plan:      The patient's current anticoagulation dose is Warfarin sodium 5 mg tabs: Take as directed by Coumadin Clinic.  The target INR is 2 - 2.5.  The next INR is due 01/20/2010.  Anticoagulation instructions were given to patient.  Results were reviewed/authorized by [name] Hey, RN.  She was notified by [name] Hey RN.         Prior Anticoagulation Instructions: INR 1.5 Take coumadin 1 tablet tonight then resume 1/2 tablet once daily   Current Anticoagulation Instructions: INR 2.5 Continue coumadin 2.5mg  once daily

## 2010-04-17 NOTE — Assessment & Plan Note (Signed)
Summary: 6 mth f/uper checkout on 01/15/09/tg  Medications Added PROCRIT [id number] UNIT/ML SOLN (EPOETIN ALFA) q 2 weeks      Allergies Added:   Visit Type:  Follow-up Referring Provider:  Dr. Benard Rink Primary Provider:  Dr. Juanetta Gosling  CC:  sob.  History of Present Illness: [name] returns today for followup.  She is a pleasant elderly woman with a h/o atrial fib, symptomatic bradycardia, s/p PPM, Aortic stenosis (moderate) and anemia secondary to iron deficiency for which she is on coumadin.  She denies c/p, but does have occaisional episodes of peripheral edema.  She has been treated with lasix but does not like to take this medicine as she has to urinate constantly.  No additonal syncope.  No other complaints.  Current Medications (verified): 1)  Simvastatin 20 Mg Tabs (Simvastatin) .... Once Daily 2)  Verapamil Hcl Cr 240 Mg Xr24h-Cap (Verapamil Hcl) .... Take 1 Tablet By Mouth Once A Day 3)  Prilosec 20 Mg Cpdr (Omeprazole) .... Take 1 Tab Two Times A Day 4)  Tylenol Extra Strength 500 Mg Tabs (Acetaminophen) .... 2 Qid 5)  Multivitamins  Tabs (Multiple Vitamin) .... Once Daily 6)  Procrit 11914 Unit/ml Soln (Epoetin Alfa) .... Q 2 Weeks 7)  Warfarin Sodium 5 Mg Tabs (Warfarin Sodium) .... Take As Directed By Coumadin Clinic 8)  Metoprolol Tartrate 25 Mg Tabs (Metoprolol Tartrate) .... Take 1 Tab Two Times A Day 9)  Oscal 500/200 D-3 500-200 Mg-Unit Tabs (Calcium-Vitamin D) .... Take 1 Tab Daily 10)  Iron Infusion .... Every Eight Weeks 11)  Furosemide 20 Mg Tabs (Furosemide) .... Take One Tablet By Mouth On Monday, Wednesday and Friday 12)  Lisinopril 10 Mg Tabs (Lisinopril) .... Take One Tablet By Mouth Daily  Allergies (verified): 1)  ! * Detrol 2)  Sulfa  Past History:  Past Medical History: Last updated: 03/22/2009 Sick sinus syndrome with paroxysmal atrial fibrillation; dual-chamber pacemaker in 11/2006 Aortic stenosis-mild Left bundle branch block Congestive heart failure  with normal ejection fraction ASCVD-coronary angio in 10/09:50% left anterior descending; 80% PDA; medical therapy advised Orthostatic change in blood pressure Epistaxis-mild with negative ENT evaluation RENAL DISEASE, CHRONIC (ICD-593.9): Creatinine of 1.7 in 8/08, 1.4 in 10/08 and 1.5 in 11/09 DIZZINESS, CHRONIC (ICD-780.4) ASTHMA (ICD-493.90) ROSACEA (ICD-695.3) Osteoarthritis Irritable bowel syndrome Diverticulosis Chronic lymphocytic leukemia with anemia H./H. of 9/27 in 11/09 with normal MCV; iron deficiency in bone       marrow GERD (ICD-530.81)  ASCITES, MALIGNANT (ICD-789.51) PAROXYSMAL ATRIAL FIBRILLATION (ICD-427.31) Leukemia chronic lymphocytic iron infusions  Past Surgical History: Last updated: 03/22/2009 Cholecystectomy C-spine LS spine procedure Bilateral cataract surgery Bladder resuspension Hysterectomy-1972 Dual-chamber placed maker implantation-9/08    Review of Systems       The patient complains of dyspnea on exertion.  The patient denies chest pain, syncope, and peripheral edema.    Vital Signs:  Patient profile:   77 year old female Weight:      161 pounds BMI:     31.56 Pulse rate:   80 / minute BP sitting:   151 / 67  (right arm)  Vitals Entered By: Dreama Saa, CNA (Jul 22, 2009 2:49 PM)  Physical Exam  General:  Well developed, well nourished, in no acute distress. Eyes:  no scleral icterus conjunctiva are pink Neck:  No JVD no thyromegally. Chest Wall:  Well healed PPM incision. Lungs:  Clear bilaterally to auscultation except for rare basilar rales. Heart:  Grade  2/6 SEM loudest primary aortic area radiation  to R carotid. Abdomen:  Bowel sounds positive; abdomen soft and non-tender without masses, organomegaly, or hernias noted. No hepatosplenomegaly. Msk:  Back normal, normal gait. Muscle strength and tone normal for age. Pulses:  pulses normal in all 4 extremities Extremities:  No clubbing or cyanosis. Neurologic:  Alert and  oriented x 3.   PPM Specifications Following MD:  Lewayne Bunting, MD     PPM Vendor:  [employer]     PPM Model Number:  [id number]     PPM Serial Number:  [id number] PPM DOI:  11/28/2006     PPM Implanting MD:  Lewayne Bunting, MD  Lead 1    Location: [facility]     DOI: 11/18/2006     Model #: 4076     Serial #: [id number]     Status: active Lead 2    Location: RV     DOI: 11/18/2006     Model #: [id number]     Serial #: [id number]     Status: active  Magnet Response Rate:  BOL 85 ERI  65  Indications:  Sick sinus syndrome   PPM Follow Up Remote Check?  No Battery Voltage:  2.77 V     Battery Est. Longevity:  5.5 years     Pacer Dependent:  No       PPM Device Measurements Atrium  Amplitude: 1.4 mV, Impedance: 386 ohms, Threshold: 0.75 V at 0.4 msec Right Ventricle  Amplitude: 31.36 mV, Impedance: 557 ohms, Threshold: 0.625 V at 0.4 msec  Episodes MS Episodes:  0     Coumadin:  Yes Ventricular High Rate:  0     Atrial Pacing:  30.9%     Ventricular Pacing:  38.4%  Parameters Mode:  DDIR     Lower Rate Limit:  60     Upper Rate Limit:  130 Paced AV Delay:  150     Next Cardiology Appt Due:  01/14/2010 Tech Comments:  ADL response reprogrammed 3->2.  [name] is c/o waking on 2 separate occasions being SOB and has noticed SOB with minimal exertion.  1935 single PVC's noted.  No other changes.   Altha Harm, LPN  Jul 23, 8414 3:11 PM  MD Comments:  Agree with above.  Impression & Recommendations:  Problem # 1:  PAROXYSMAL ATRIAL FIBRILLATION (ICD-427.31) She appears to be tolerating a strategy of rate control.  Continue current meds. Her updated medication list for this problem includes:    Warfarin Sodium 5 Mg Tabs (Warfarin sodium) .[name]... Take as directed by coumadin clinic    Metoprolol Tartrate 25 Mg Tabs (Metoprolol tartrate) .[name]... Take 1 tab two times a day  Problem # 2:  HYPERTENSION (ICD-401.9) I have discussed the importance of a low sodium diet.  Continue meds as below. Her updated  medication list for this problem includes:    Verapamil Hcl Cr 240 Mg Xr24h-cap (Verapamil hcl) .[name]... Take 1 tablet by mouth once a day    Metoprolol Tartrate 25 Mg Tabs (Metoprolol tartrate) .[name]... Take 1 tab two times a day    Furosemide 20 Mg Tabs (Furosemide) .[name]... Take one tablet by mouth on monday, wednesday and friday    Lisinopril 10 Mg Tabs (Lisinopril) .[name]... Take one tablet by mouth daily  Problem # 3:  CHF (ICD-428.0) I have asked her to continue her meds.  Also, a low sodium diet and lasix as needed have been discussed. Her updated medication list for this problem includes:    Verapamil Hcl Cr  240 Mg Xr24h-cap (Verapamil hcl) .[name]... Take 1 tablet by mouth once a day    Warfarin Sodium 5 Mg Tabs (Warfarin sodium) .[name]... Take as directed by coumadin clinic    Metoprolol Tartrate 25 Mg Tabs (Metoprolol tartrate) .[name]... Take 1 tab two times a day    Furosemide 20 Mg Tabs (Furosemide) .[name]... Take one tablet by mouth on monday, wednesday and friday    Lisinopril 10 Mg Tabs (Lisinopril) .[name]... Take one tablet by mouth daily

## 2010-04-17 NOTE — Assessment & Plan Note (Signed)
Summary: F3M  Medications Added * IRON INFUSION every 4 months      Allergies Added:   Visit Type:  Follow-up Referring Provider:  Dr. Benard Rink Primary Provider:  Dr. Juanetta Gosling   History of Present Illness: [name] is a delightful 77 year old woman with multiple cardiovascular issues including sick sinus syndrome and prior pacemaker implantation, mild to moderate aortic stenosis, and a history of congestive heart failure with preserved left ventricular systolic function, coronary artery disease and cardiovascular risk factors.  She remains active, continues to drive, lives alone and cares for herself and is amazingly sharp mentally.  Over the past month, she experienced 2 episodes of nocturnal discomfort described as very mild nagging right upper chest symptoms.  She had associated dyspnea, which improved after she arose from bed.  She notes that dyspnea is modest, but present most of the time.  She has nitroglycerin at home and thinks about using it, but rarely does.  She does not have clear exertional dyspnea.  There is no definite orthopnea nor PND, although her symptoms above are compatible with those diagnoses.  She has chronic dizziness, but has had no loss of consciousness.  She is followed by Dr. Mariel Sleet for iron deficiency and chronic lymphocytic leukemia, which has not yet required treatment.  She is followed by Dr. Retta Diones for urinary frequency and incontinence.  A number of medication trials have proven ineffective.  Although furosemide resulted in less dyspnea, she discontinued that medication to allow her urinary symptoms to improve.  She also has had some sort of gynecologic carbuncle that resolved after treatment with Premarin.     Current Medications (verified): 1)  Simvastatin 20 Mg Tabs (Simvastatin) .... Once Daily 2)  Verapamil Hcl Cr 240 Mg Xr24h-Cap (Verapamil Hcl) .... Take 1 Tablet By Mouth Once A Day 3)  Prilosec 20 Mg Cpdr (Omeprazole) .... Take 1 Tab Two  Times A Day 4)  Tylenol Extra Strength 500 Mg Tabs (Acetaminophen) .... 2 Qid 5)  Multivitamins  Tabs (Multiple Vitamin) .... Once Daily 6)  Procrit 16109 Unit/ml Soln (Epoetin Alfa) .... Q 2 Weeks 7)  Warfarin Sodium 5 Mg Tabs (Warfarin Sodium) .... Take As Directed By Coumadin Clinic 8)  Metoprolol Tartrate 25 Mg Tabs (Metoprolol Tartrate) .... Take 1 Tab Two Times A Day 9)  Oscal 500/200 D-3 500-200 Mg-Unit Tabs (Calcium-Vitamin D) .... Take 1 Tab Daily 10)  Iron Infusion .... Every 4 Months 11)  Lisinopril 10 Mg Tabs (Lisinopril) .... Take One Tablet By Mouth Daily  Allergies (verified): 1)  ! * Detrol 2)  Sulfa  Past History:  PMH, FH, and Social History reviewed and updated.  Past Medical History: Sick sinus syndrome with paroxysmal atrial fibrillation; dual-chamber pacemaker in 11/2006 Aortic stenosis-mild Left bundle branch block Congestive heart failure with normal ejection fraction ASCVD-coronary angio in 10/09:50% left anterior descending; 80% PDA; medical therapy advised Orthostatic decrease in blood pressure Epistaxis-mild with negative ENT evaluation RENAL DISEASE, CHRONIC (ICD-593.9): Creatinine of 1.7 in 8/08, 1.4 in 10/08 and 1.5 in 11/09 DIZZINESS, CHRONIC (ICD-780.4) Chronic dyspnea Osteoarthritis Irritable bowel syndrome; diverticulosis; adenoma of the small bowel CLL with anemia H./H. of 9/27 in 11/09 with normal MCV; iron deficiency in bone marrow Urinary frequency and incontinence Gastroesophageal reflux disease Rosacea Childhood asthma  Past Surgical History: Cholecystectomy C-spine LS spine procedure Bilateral cataract surgery Bladder resuspension Hysterectomy-1972 Dual-chamber pacemaker implantation-9/08    Review of Systems  The patient denies anorexia, weight loss, weight gain, vision loss, decreased hearing, headaches, and  abdominal pain.         See history of present illness.  Vital Signs:  Patient profile:   77 year old  female Weight:      160 pounds Pulse rate:   81 / minute Pulse (ortho):   77 / minute BP sitting:   135 / 71  (right arm) BP standing:   137 / 67  Vitals Entered By: Dreama Saa, CNA (August 22, 2009 1:02 PM)  Serial Vital Signs/Assessments:  Time      Position  BP       Pulse  Resp  Temp     By 1:26 PM   Lying LA  133/59   76                    Lynn Via LPN 4:09 PM   Sitting   137/67   77                    Lynn Via LPN 8:11 PM   Standing  137/67   77                    Lynn Via LPN  Comments: 9:14 PM c/o slight dizziness when sitting up from lying position and when standing up from sitting. By: Larita Fife Via LPN    Physical Exam  General:  General:  Well developed, well nourished, in no acute distress. Eyes:  no scleral icterus conjunctiva are benign Neck:  No JVD Chest Wall:  Well healed PPM incision. Lungs: Few basilar rales. Heart:  Grade  2/6 SEM  Abdomen:  Bowel sounds positive; abdomen soft and non-tender without masses, organomegaly, or hernias noted. No hepatosplenomegaly. Pulses:  normal distal pulses Extremities:  No clubbing or cyanosis. Neurologic: Normal strength and tone.  Normal cranial nerves.   PPM Specifications Following MD:  Lewayne Bunting, MD     PPM Vendor:  [employer]     PPM Model Number:  [id number]     PPM Serial Number:  [id number] PPM DOI:  11/28/2006     PPM Implanting MD:  Lewayne Bunting, MD  Lead 1    Location: RA     DOI: 11/18/2006     Model #: [id number]     Serial #: [id number]     Status: active Lead 2    Location: RV     DOI: 11/18/2006     Model #: [id number]     Serial #: [id number]     Status: active  Magnet Response Rate:  BOL 85 ERI  65  Indications:  Sick sinus syndrome   PPM Follow Up Pacer Dependent:  No      Episodes Coumadin:  Yes  Parameters Mode:  DDIR     Lower Rate Limit:  60     Upper Rate Limit:  130 Paced AV Delay:  150     Impression & Recommendations:  Problem # 1:  CHEST PAIN (ICD-786.50) Symptoms are currently minimal, and  no further evaluation or treatment is necessary at the present time.  Problem # 2:  CHF (ICD-428.0) It still is not clear that [name] definitely has CHF.  Since she certainly does not have pulmonary edema, I gave her the choice of taking furosemide to have less dyspnea or omitting it to improve urinary symptoms.  An appointment was made with her urologist for reevaluation of frequency and incontinence.  Problem # 3:  ATHEROSCLEROTIC CARDIOVASCULAR DISEASE (ICD-429.2) She had mild  to moderate disease at catheterization less than 2 years ago.  There is no compelling clinical evidence for progression of disease.  Problem # 4:  SICK SINUS SYNDROME (ICD-427.81) Dr. Ladona Ridgel continues to follow her closely for this problem.  her last pacemaker assessment one month ago showed no mode switches although she is maintained in the DDIR mode. I will ask Dr. Ladona Ridgel whether he advises continuation of Coumadin under the circumstances.  Problem # 5:  COUMADIN THERAPY (ICD-V58.61) Anemia has resolved.  Dr. Thornton Papas records were obtained and reviewed.  Procrit has been used to assist in maintenance of a near-normal hemoglobin.  She also has received intravenous iron.  Her hematologist is attempting to maintain a relatively high level of hemoglobin in the hopes that this will improve her dyspnea.  I will plan to reassess this nice woman in the office in one year.  Other Orders: EKG w/ Interpretation (93000) Future Orders: T-Lipid Profile (04540-98119) ... 08/26/2009  Patient Instructions: 1)  Your physician recommends that you schedule a follow-up appointment in: 1 year 2)  Your physician recommends that you return for lab work in:1 week 3)  Your physician recommends that you weigh, daily, at the same time every day, and in the same amount of clothing.  Please record your daily weights on the handout provided and bring it to your next appointment. call for 5 lbs for wt gain 4)  MOVE APPT WITH  DR. DAHLSTEDT  UP TO 1ST AVAILABLE- NOW SCHEDULED FOR SEPT FOR INCONTINENCE

## 2010-04-17 NOTE — Letter (Signed)
Summary: [facility] OFFICE NOTE  [facility] OFF[facility] NOTE   Imported By: Diana Eves 03/19/2009 16:41:03  _____________________________________________________________________  External Attachment:    Type:   Image     Comment:   External Document

## 2010-04-17 NOTE — Medication Information (Signed)
Summary: ccr-lr  Anticoagulant Therapy  Managed by: Vashti Hey, RN PCP: Dr. Hoover Browns MD: Dietrich Pates MD, Molly Maduro Indication 1: Atrial Fibrillation (ICD-427.31) Lab Used: [facility] [place] Site: [place] INR POC 1.8  Dietary changes: no    Health status changes: no    Bleeding/hemorrhagic complications: no    Recent/future hospitalizations: no    Any changes in medication regimen? no    Recent/future dental: no  Any missed doses?: no       Is patient compliant with meds? yes       Allergies: 1)  ! * Detrol 2)  Sulfa  Anticoagulation Management History:      The patient is taking warfarin and comes in today for a routine follow up visit.  Positive risk factors for bleeding include an age of 75 years or older.  Negative risk factors for bleeding include no history of CVA/TIA.  The bleeding index is 'intermediate risk'.  Positive CHADS2 values include History of CHF, History of HTN, and Age > 30 years old.  Negative CHADS2 values include History of Diabetes and Prior Stroke/CVA/TIA.  The start date was 10/03/2006.  Anticoagulation responsible provider: Dietrich Pates MD, Molly Maduro.  INR POC: 1.8.  Cuvette Lot#: [id number].  Exp: 10/11.    Anticoagulation Management Assessment/Plan:      The patient's current anticoagulation dose is Warfarin sodium 5 mg tabs: Take as directed by [facility]madin Clinic.  The target INR is 2 - 2.5.  The next INR is due 07/15/2009.  Anticoagulation instructions were given to patient.  Results were reviewed/authorized by Vashti Hey, RN.  She was notified by Vashti Hey RN.         Prior Anticoagulation Instructions: INR 1.6 Take coumadin 3/4 tablet tonight then increase dose to 1/2 tablet once daily except none on Fridays  Current Anticoagulation Instructions: INR 1.8 Take coumadin 3/4 tablet tonight then increase dose to 1/2 tablet once daily except 1/4 tablet on Fridays

## 2010-04-17 NOTE — Cardiovascular Report (Signed)
Summary: Office Visit   Office Visit   Imported By: Roderic Ovens 02/24/2010 14:09:28  _____________________________________________________________________  External Attachment:    Type:   Image     Comment:   External Document

## 2010-04-17 NOTE — Letter (Signed)
Summary: Handout Printed  Printed Handout:  - Diet - Potassium Content of Foods 

## 2010-04-17 NOTE — Medication Information (Signed)
Summary: ccr-lr  Anticoagulant Therapy  Managed by: Vashti Hey, RN PCP: Dr. Hoover Browns MD: Dietrich Pates MD, Molly Maduro Indication 1: Atrial Fibrillation (ICD-427.31) Lab Used: [facility] [place] Site: [place] INR POC 1.6  Dietary changes: no    Health status changes: no    Bleeding/hemorrhagic complications: no    Recent/future hospitalizations: no    Any changes in medication regimen? no    Recent/future dental: no  Any missed doses?: no       Is patient compliant with meds? yes       Allergies: 1)  ! * Detrol 2)  Sulfa  Anticoagulation Management History:      Positive risk factors for bleeding include an age of 59 years or older.  Negative risk factors for bleeding include no history of CVA/TIA.  The bleeding index is 'intermediate risk'.  Positive CHADS2 values include History of CHF, History of HTN, and Age > 89 years old.  Negative CHADS2 values include History of Diabetes and Prior Stroke/CVA/TIA.  The start date was 10/03/2006.  Anticoagulation responsible provider: Dietrich Pates MD, Molly Maduro.  INR POC: 1.6.  Exp: 10/11.    Anticoagulation Management Assessment/Plan:      The patient's current anticoagulation dose is Warfarin sodium 5 mg tabs: Take as directed by [facility]madin Clinic.  The target INR is 2 - 2.5.  The next INR is due 06/26/2009.  Anticoagulation instructions were given to patient.  Results were reviewed/authorized by Vashti Hey, RN.  She was notified by Vashti Hey RN.         Prior Anticoagulation Instructions: INR 4.4 Decrease coumadin to 2.5mg  once daily except none on Mondays and Fridays  Current Anticoagulation Instructions: INR 1.6 Take coumadin 3/4 tablet tonight then increase dose to 1/2 tablet once daily except none on Fridays

## 2010-04-17 NOTE — Medication Information (Signed)
Summary: ccr-lr  Anticoagulant Therapy  Managed by: Vashti Hey, RN PCP: Dr. Hoover Browns MD: Dietrich Pates MD, Molly Maduro Indication 1: Atrial Fibrillation (ICD-427.31) Lab Used: [facility] [place] Site: [place] INR POC 2.4  Dietary changes: no    Health status changes: no    Bleeding/hemorrhagic complications: no    Recent/future hospitalizations: no    Any changes in medication regimen? no    Recent/future dental: no  Any missed doses?: no       Is patient compliant with meds? yes       Allergies: 1)  ! * Detrol 2)  Sulfa  Anticoagulation Management History:      The patient is taking warfarin and comes in today for a routine follow up visit.  Positive risk factors for bleeding include an age of 74 years or older.  Negative risk factors for bleeding include no history of CVA/TIA.  The bleeding index is 'intermediate risk'.  Positive CHADS2 values include History of HTN and Age > 45 years old.  Negative CHADS2 values include History of CHF, History of Diabetes, and Prior Stroke/CVA/TIA.  The start date was 10/03/2006.  Anticoagulation responsible provider: Dietrich Pates MD, Molly Maduro.  INR POC: 2.4.  Cuvette Lot#: [id number].  Exp: 10/11.    Anticoagulation Management Assessment/Plan:      The patient's current anticoagulation dose is Warfarin sodium 5 mg tabs: Take as directed by [facility]madin Clinic.  The target INR is 2 - 2.5.  The next INR is due 11/11/2009.  Anticoagulation instructions were given to patient.  Results were reviewed/authorized by Vashti Hey, RN.  She was notified by Vashti Hey RN.         Prior Anticoagulation Instructions: INR 2.3 Continue coumadin 2.5mg  once daily   Current Anticoagulation Instructions: INR 2.4 Continue coumadin 2.5mg  once daily

## 2010-04-30 ENCOUNTER — Encounter: Payer: Self-pay | Admitting: Cardiology

## 2010-04-30 ENCOUNTER — Encounter (INDEPENDENT_AMBULATORY_CARE_PROVIDER_SITE_OTHER): Payer: Medicare Other

## 2010-04-30 DIAGNOSIS — Z7901 Long term (current) use of anticoagulants: Secondary | ICD-10-CM

## 2010-04-30 DIAGNOSIS — I4891 Unspecified atrial fibrillation: Secondary | ICD-10-CM

## 2010-04-30 LAB — CONVERTED CEMR LAB: POC INR: 1.1

## 2010-05-06 ENCOUNTER — Ambulatory Visit (HOSPITAL_COMMUNITY): Payer: Medicare Other

## 2010-05-06 ENCOUNTER — Ambulatory Visit (INDEPENDENT_AMBULATORY_CARE_PROVIDER_SITE_OTHER): Payer: Medicare Other | Admitting: Internal Medicine

## 2010-05-06 ENCOUNTER — Other Ambulatory Visit (HOSPITAL_COMMUNITY): Payer: Medicare Other

## 2010-05-06 ENCOUNTER — Encounter (HOSPITAL_COMMUNITY): Payer: Medicare Other | Attending: Oncology

## 2010-05-06 DIAGNOSIS — C9110 Chronic lymphocytic leukemia of B-cell type not having achieved remission: Secondary | ICD-10-CM

## 2010-05-06 DIAGNOSIS — D509 Iron deficiency anemia, unspecified: Secondary | ICD-10-CM | POA: Insufficient documentation

## 2010-05-06 DIAGNOSIS — D638 Anemia in other chronic diseases classified elsewhere: Secondary | ICD-10-CM

## 2010-05-06 DIAGNOSIS — R197 Diarrhea, unspecified: Secondary | ICD-10-CM

## 2010-05-06 DIAGNOSIS — N189 Chronic kidney disease, unspecified: Secondary | ICD-10-CM

## 2010-05-07 NOTE — Medication Information (Signed)
Summary: ccr/rm  Anticoagulant Therapy  Managed by: Vashti Hey, RN PCP: Dr. Hoover Browns MD: Diona Browner MD, Remi Deter Indication 1: Atrial Fibrillation (ICD-427.31) Lab Used: [facility] [place] Site: [place] INR POC 1.1  Dietary changes: no    Health status changes: no    Bleeding/hemorrhagic complications: yes       Details: Had recurrance of urethral carbuncle bleeding  Recent/future hospitalizations: no    Any changes in medication regimen? no    Recent/future dental: no  Any missed doses?: yes     Details: Pt held coumadin x 3 days due to urethral bleeding  Started back last night  Is patient compliant with meds? yes       Allergies: 1)  ! * Detrol 2)  Sulfa  Anticoagulation Management History:      The patient is taking warfarin and comes in today for a routine follow up visit.  Positive risk factors for bleeding include an age of 75 years or older.  Negative risk factors for bleeding include no history of CVA/TIA.  The bleeding index is 'intermediate risk'.  Positive CHADS2 values include History of HTN and Age > 75 years old.  Negative CHADS2 values include History of CHF, History of Diabetes, and Prior Stroke/CVA/TIA.  The start date was 10/03/2006.  Anticoagulation responsible provider: Diona Browner MD, Remi Deter.  INR POC: 1.1.  Cuvette Lot#: [id number].  Exp: 10/11.    Anticoagulation Management Assessment/Plan:      The patient's current anticoagulation dose is Warfarin sodium 5 mg tabs: Take as directed by Coumadin Clinic.  The target INR is 2 - 2.5.  The next INR is due 05/14/2010.  Anticoagulation instructions were given to patient.  Results were reviewed/authorized by Vashti Hey, RN.  She was notified by Vashti Hey RN.         Prior Anticoagulation Instructions: INR 2.3 Continue coumadin 2.5mg  once daily   Current Anticoagulation Instructions: INR 1.1 Resume coumadin 2.5mg  once daily  Has appt with urologist 05/09/10

## 2010-05-08 ENCOUNTER — Ambulatory Visit (INDEPENDENT_AMBULATORY_CARE_PROVIDER_SITE_OTHER): Payer: Medicare Other | Admitting: Otolaryngology

## 2010-05-08 DIAGNOSIS — H6120 Impacted cerumen, unspecified ear: Secondary | ICD-10-CM

## 2010-05-08 DIAGNOSIS — R040 Epistaxis: Secondary | ICD-10-CM

## 2010-05-09 ENCOUNTER — Ambulatory Visit (INDEPENDENT_AMBULATORY_CARE_PROVIDER_SITE_OTHER): Payer: Medicare Other | Admitting: Urology

## 2010-05-09 DIAGNOSIS — N952 Postmenopausal atrophic vaginitis: Secondary | ICD-10-CM

## 2010-05-09 DIAGNOSIS — N362 Urethral caruncle: Secondary | ICD-10-CM

## 2010-05-14 ENCOUNTER — Encounter (INDEPENDENT_AMBULATORY_CARE_PROVIDER_SITE_OTHER): Payer: Medicare Other

## 2010-05-14 ENCOUNTER — Encounter: Payer: Self-pay | Admitting: Cardiology

## 2010-05-14 DIAGNOSIS — I4891 Unspecified atrial fibrillation: Secondary | ICD-10-CM

## 2010-05-14 DIAGNOSIS — Z7901 Long term (current) use of anticoagulants: Secondary | ICD-10-CM

## 2010-05-14 LAB — CONVERTED CEMR LAB: POC INR: 2

## 2010-05-22 NOTE — Medication Information (Signed)
Summary: ccr-lr  Anticoagulant Therapy  Managed by: Vashti Hey, RN PCP: Dr. Hoover Browns MD: Dietrich Pates MD, Molly Maduro Indication 1: Atrial Fibrillation (ICD-427.31) Lab Used: [facility]  Site: [place] INR POC 2.0  Dietary changes: no    Health status changes: no    Bleeding/hemorrhagic complications: no    Recent/future hospitalizations: no    Any changes in medication regimen? no    Recent/future dental: no  Any missed doses?: yes     Details: Held 1 dose last week after having nose cauterized  Is patient compliant with meds? yes       Allergies: 1)  ! * Detrol 2)  Sulfa  Anticoagulation Management History:      The patient is taking warfarin and comes in today for a routine follow up visit.  Positive risk factors for bleeding include an age of 14 years or older.  Negative risk factors for bleeding include no history of CVA/TIA.  The bleeding index is 'intermediate risk'.  Positive CHADS2 values include History of HTN and Age > 26 years old.  Negative CHADS2 values include History of CHF, History of Diabetes, and Prior Stroke/CVA/TIA.  The start date was 10/03/2006.  Anticoagulation responsible provider: Dietrich Pates MD, Molly Maduro.  INR POC: 2.0.  Cuvette Lot#: [id number].  Exp: 10/11.    Anticoagulation Management Assessment/Plan:      The patient's current anticoagulation dose is Warfarin sodium 5 mg tabs: Take as directed by Coumadin Clinic.  The target INR is 2 - 2.5.  The next INR is due 06/12/2010.  Anticoagulation instructions were given to patient.  Results were reviewed/authorized by Vashti Hey, RN.  She was notified by Vashti Hey RN.         Prior Anticoagulation Instructions: INR 1.1 Resume coumadin 2.5mg  once daily  Has appt with urologist 05/09/10   Current Anticoagulation Instructions: INR 2.0 Continue coumadin 2.5mg  once daily  Pt will call for appt when she checks her calendar

## 2010-05-26 LAB — CBC
Hemoglobin: 12.2 g/dL (ref 12.0–15.0)
MCH: 31.4 pg (ref 26.0–34.0)
RBC: 3.89 MIL/uL (ref 3.87–5.11)
WBC: 17.6 10*3/uL — ABNORMAL HIGH (ref 4.0–10.5)

## 2010-05-26 LAB — DIFFERENTIAL
Basophils Absolute: 0.2 10*3/uL — ABNORMAL HIGH (ref 0.0–0.1)
Eosinophils Relative: 3 % (ref 0–5)
Lymphocytes Relative: 64 % — ABNORMAL HIGH (ref 12–46)
Monocytes Relative: 4 % (ref 3–12)
Neutrophils Relative %: 28 % — ABNORMAL LOW (ref 43–77)

## 2010-05-27 LAB — DIFFERENTIAL
Basophils Absolute: 0.2 10*3/uL — ABNORMAL HIGH (ref 0.0–0.1)
Eosinophils Relative: 3 % (ref 0–5)
Monocytes Absolute: 0.6 10*3/uL (ref 0.1–1.0)
Monocytes Relative: 4 % (ref 3–12)
Neutrophils Relative %: 29 % — ABNORMAL LOW (ref 43–77)

## 2010-05-27 LAB — CBC
Hemoglobin: 11.5 g/dL — ABNORMAL LOW (ref 12.0–15.0)
MCHC: 33.5 g/dL (ref 30.0–36.0)
Platelets: 155 10*3/uL (ref 150–400)
RBC: 3.56 MIL/uL — ABNORMAL LOW (ref 3.87–5.11)

## 2010-05-28 LAB — CBC
HCT: 34.7 % — ABNORMAL LOW (ref 36.0–46.0)
RDW: 14.8 % (ref 11.5–15.5)
WBC: 16.4 10*3/uL — ABNORMAL HIGH (ref 4.0–10.5)

## 2010-05-28 LAB — DIFFERENTIAL
Basophils Absolute: 0 10*3/uL (ref 0.0–0.1)
Eosinophils Absolute: 0.5 10*3/uL (ref 0.0–0.7)
Eosinophils Relative: 3 % (ref 0–5)
Monocytes Absolute: 0.7 10*3/uL (ref 0.1–1.0)
Neutrophils Relative %: 32 % — ABNORMAL LOW (ref 43–77)

## 2010-05-29 ENCOUNTER — Ambulatory Visit (INDEPENDENT_AMBULATORY_CARE_PROVIDER_SITE_OTHER): Payer: Medicare Other | Admitting: Otolaryngology

## 2010-05-29 DIAGNOSIS — R040 Epistaxis: Secondary | ICD-10-CM

## 2010-05-29 LAB — DIFFERENTIAL
Basophils Absolute: 0 10*3/uL (ref 0.0–0.1)
Basophils Absolute: 0 10*3/uL (ref 0.0–0.1)
Eosinophils Absolute: 0.3 10*3/uL (ref 0.0–0.7)
Eosinophils Relative: 2 % (ref 0–5)
Lymphocytes Relative: 61 % — ABNORMAL HIGH (ref 12–46)
Lymphs Abs: 10.7 10*3/uL — ABNORMAL HIGH (ref 0.7–4.0)
Monocytes Absolute: 0.7 10*3/uL (ref 0.1–1.0)
Monocytes Relative: 4 % (ref 3–12)
Monocytes Relative: 4 % (ref 3–12)
Neutrophils Relative %: 33 % — ABNORMAL LOW (ref 43–77)
Neutrophils Relative %: 31 % — ABNORMAL LOW (ref 43–77)

## 2010-05-29 LAB — CBC
Hemoglobin: 11.6 g/dL — ABNORMAL LOW (ref 12.0–15.0)
Platelets: 223 10*3/uL (ref 150–400)
Platelets: 174 10*3/uL (ref 150–400)
RBC: 3.59 MIL/uL — ABNORMAL LOW (ref 3.87–5.11)
RBC: 3.74 MIL/uL — ABNORMAL LOW (ref 3.87–5.11)
RDW: 14 % (ref 11.5–15.5)
WBC: 17.4 10*3/uL — ABNORMAL HIGH (ref 4.0–10.5)

## 2010-05-29 LAB — IRON AND TIBC
Iron: 96 ug/dL (ref 42–135)
TIBC: 317 ug/dL (ref 250–470)
UIBC: 221 ug/dL

## 2010-05-29 LAB — FERRITIN: Ferritin: 52 ng/mL (ref 10–291)

## 2010-05-31 LAB — DIFFERENTIAL
Basophils Relative: 0 % (ref 0–1)
Eosinophils Absolute: 0.3 10*3/uL (ref 0.0–0.7)
Eosinophils Relative: 2 % (ref 0–5)
Lymphs Abs: 9.4 10*3/uL — ABNORMAL HIGH (ref 0.7–4.0)
Monocytes Absolute: 0.6 10*3/uL (ref 0.1–1.0)
Neutrophils Relative %: 32 % — ABNORMAL LOW (ref 43–77)

## 2010-05-31 LAB — CBC
HCT: 38 % (ref 36.0–46.0)
Hemoglobin: 12.6 g/dL (ref 12.0–15.0)
MCH: 31.4 pg (ref 26.0–34.0)
MCHC: 33.3 g/dL (ref 30.0–36.0)
RDW: 14.5 % (ref 11.5–15.5)

## 2010-06-01 LAB — CBC
HCT: 37.8 % (ref 36.0–46.0)
MCH: 31.4 pg (ref 26.0–34.0)
MCHC: 33.3 g/dL (ref 30.0–36.0)
MCV: 94.2 fL (ref 78.0–100.0)
Platelets: 148 10*3/uL — ABNORMAL LOW (ref 150–400)
RDW: 15.4 % (ref 11.5–15.5)

## 2010-06-01 LAB — DIFFERENTIAL
Basophils Relative: 0 % (ref 0–1)
Eosinophils Absolute: 0.3 10*3/uL (ref 0.0–0.7)
Eosinophils Relative: 2 % (ref 0–5)
Lymphs Abs: 9.4 10*3/uL — ABNORMAL HIGH (ref 0.7–4.0)
Monocytes Absolute: 0.6 10*3/uL (ref 0.1–1.0)
Neutro Abs: 4.6 10*3/uL (ref 1.7–7.7)

## 2010-06-02 LAB — CBC
MCHC: 33 g/dL (ref 30.0–36.0)
MCV: 94.6 fL (ref 78.0–100.0)
RBC: 4.21 MIL/uL (ref 3.87–5.11)
RDW: 16.2 % — ABNORMAL HIGH (ref 11.5–15.5)

## 2010-06-02 LAB — DIFFERENTIAL
Eosinophils Absolute: 0.3 10*3/uL (ref 0.0–0.7)
Eosinophils Relative: 2 % (ref 0–5)
Lymphs Abs: 10.3 10*3/uL — ABNORMAL HIGH (ref 0.7–4.0)
Monocytes Absolute: 0.7 10*3/uL (ref 0.1–1.0)

## 2010-06-03 ENCOUNTER — Ambulatory Visit (INDEPENDENT_AMBULATORY_CARE_PROVIDER_SITE_OTHER): Payer: Medicare Other | Admitting: Urology

## 2010-06-03 ENCOUNTER — Telehealth: Payer: Self-pay | Admitting: *Deleted

## 2010-06-03 DIAGNOSIS — N3941 Urge incontinence: Secondary | ICD-10-CM

## 2010-06-03 DIAGNOSIS — R350 Frequency of micturition: Secondary | ICD-10-CM

## 2010-06-03 DIAGNOSIS — R3915 Urgency of urination: Secondary | ICD-10-CM

## 2010-06-03 LAB — CBC
HCT: 34.9 % — ABNORMAL LOW (ref 36.0–46.0)
MCV: 91.8 fL (ref 78.0–100.0)
Platelets: 170 10*3/uL (ref 150–400)
RDW: 15 % (ref 11.5–15.5)

## 2010-06-03 LAB — FERRITIN: Ferritin: 39 ng/mL (ref 10–291)

## 2010-06-03 NOTE — Telephone Encounter (Signed)
PT walked in office to talk with coumadin clinic she has been having nose bleeds and stopped taking her coumadin on Saturday and has not been on it since.

## 2010-06-04 ENCOUNTER — Telehealth: Payer: Self-pay | Admitting: *Deleted

## 2010-06-04 LAB — CBC
MCHC: 33.5 g/dL (ref 30.0–36.0)
RBC: 3.14 MIL/uL — ABNORMAL LOW (ref 3.87–5.11)
RDW: 13.8 % (ref 11.5–15.5)

## 2010-06-04 LAB — DIFFERENTIAL
Eosinophils Absolute: 0.3 10*3/uL (ref 0.0–0.7)
Monocytes Absolute: 0.7 10*3/uL (ref 0.1–1.0)
Neutrophils Relative %: 36 % — ABNORMAL LOW (ref 43–77)

## 2010-06-09 ENCOUNTER — Other Ambulatory Visit (HOSPITAL_COMMUNITY): Payer: Medicare Other

## 2010-06-09 ENCOUNTER — Ambulatory Visit (HOSPITAL_COMMUNITY): Payer: Medicare Other

## 2010-06-09 ENCOUNTER — Encounter (HOSPITAL_COMMUNITY): Payer: Medicare Other | Attending: Oncology

## 2010-06-09 DIAGNOSIS — C9110 Chronic lymphocytic leukemia of B-cell type not having achieved remission: Secondary | ICD-10-CM

## 2010-06-09 DIAGNOSIS — D649 Anemia, unspecified: Secondary | ICD-10-CM

## 2010-06-09 DIAGNOSIS — D509 Iron deficiency anemia, unspecified: Secondary | ICD-10-CM | POA: Insufficient documentation

## 2010-06-10 ENCOUNTER — Other Ambulatory Visit (HOSPITAL_COMMUNITY): Payer: Self-pay | Admitting: Oncology

## 2010-06-10 ENCOUNTER — Other Ambulatory Visit: Payer: Self-pay | Admitting: *Deleted

## 2010-06-10 ENCOUNTER — Ambulatory Visit (HOSPITAL_COMMUNITY): Payer: Medicare Other | Admitting: Oncology

## 2010-06-10 ENCOUNTER — Encounter: Payer: Self-pay | Admitting: Cardiology

## 2010-06-10 DIAGNOSIS — D638 Anemia in other chronic diseases classified elsewhere: Secondary | ICD-10-CM

## 2010-06-10 DIAGNOSIS — Z7901 Long term (current) use of anticoagulants: Secondary | ICD-10-CM | POA: Insufficient documentation

## 2010-06-10 DIAGNOSIS — I4891 Unspecified atrial fibrillation: Secondary | ICD-10-CM

## 2010-06-10 DIAGNOSIS — M25559 Pain in unspecified hip: Secondary | ICD-10-CM

## 2010-06-10 DIAGNOSIS — M549 Dorsalgia, unspecified: Secondary | ICD-10-CM

## 2010-06-10 DIAGNOSIS — C9110 Chronic lymphocytic leukemia of B-cell type not having achieved remission: Secondary | ICD-10-CM

## 2010-06-10 MED ORDER — WARFARIN SODIUM 2.5 MG PO TABS: 2.5000 mg | ORAL_TABLET | Freq: Every day | ORAL | Status: DC

## 2010-06-12 ENCOUNTER — Ambulatory Visit (INDEPENDENT_AMBULATORY_CARE_PROVIDER_SITE_OTHER): Payer: Medicare Other | Admitting: *Deleted

## 2010-06-12 ENCOUNTER — Ambulatory Visit (HOSPITAL_COMMUNITY)
Admission: RE | Admit: 2010-06-12 | Discharge: 2010-06-12 | Disposition: A | Discharge status: Home / Self Care | Payer: Medicare Other | Source: Ambulatory Visit | Attending: Oncology | Admitting: Oncology

## 2010-06-12 DIAGNOSIS — M25559 Pain in unspecified hip: Secondary | ICD-10-CM | POA: Insufficient documentation

## 2010-06-12 DIAGNOSIS — M545 Low back pain, unspecified: Secondary | ICD-10-CM | POA: Insufficient documentation

## 2010-06-12 DIAGNOSIS — I4891 Unspecified atrial fibrillation: Secondary | ICD-10-CM

## 2010-06-12 DIAGNOSIS — M5137 Other intervertebral disc degeneration, lumbosacral region: Secondary | ICD-10-CM | POA: Insufficient documentation

## 2010-06-12 DIAGNOSIS — Z950 Presence of cardiac pacemaker: Secondary | ICD-10-CM | POA: Insufficient documentation

## 2010-06-12 DIAGNOSIS — Z7901 Long term (current) use of anticoagulants: Secondary | ICD-10-CM

## 2010-06-12 DIAGNOSIS — M549 Dorsalgia, unspecified: Secondary | ICD-10-CM

## 2010-06-12 DIAGNOSIS — M5126 Other intervertebral disc displacement, lumbar region: Secondary | ICD-10-CM | POA: Insufficient documentation

## 2010-06-12 DIAGNOSIS — M51379 Other intervertebral disc degeneration, lumbosacral region without mention of lumbar back pain or lower extremity pain: Secondary | ICD-10-CM | POA: Insufficient documentation

## 2010-06-13 NOTE — Telephone Encounter (Signed)
See notes from Vashti Hey, RN above.

## 2010-06-16 LAB — CBC
HCT: 32.2 % — ABNORMAL LOW (ref 36.0–46.0)
MCV: 96.3 fL (ref 78.0–100.0)
RBC: 3.34 MIL/uL — ABNORMAL LOW (ref 3.87–5.11)
WBC: 17.5 10*3/uL — ABNORMAL HIGH (ref 4.0–10.5)

## 2010-06-16 LAB — DIFFERENTIAL
Basophils Absolute: 0.2 10*3/uL — ABNORMAL HIGH (ref 0.0–0.1)
Basophils Relative: 1 % (ref 0–1)
Lymphocytes Relative: 51 % — ABNORMAL HIGH (ref 12–46)
Monocytes Relative: 5 % (ref 3–12)
Neutro Abs: 6.8 10*3/uL (ref 1.7–7.7)

## 2010-06-16 NOTE — Consult Note (Signed)
NAMEKEYRI, SALBERG                ACCOUNT NO.:  [id number]  MEDICAL RECORD NO.:  [id number]           PATIENT TYPE:  AMB  LOCATION:                     [facility]  PHYSICIAN:  Lionel December, M.D.    DATE OF BIRTH:  May 13, 1915  DATE :  05/06/2010                                 OFFICE VISIT.   PRESENTING COMPLAINT:  Followup for refractory IBS.  SUBJECTIVE:  [name] is a 77 year old Caucasian female patient of Dr. Juanetta Gosling who is here for scheduled visit.  She was last seen in November 2011 and December 2011.  She kept stool diary for the last 16 days.  She continues to experience diarrhea, urgency, accidents and episodic severe lower abdominal cramps.  She has not seen any blood in her stool.  She has had anywhere from 1-5 stools per day.  Most of her stools have been soft or firm.  She reported 2 episodes of incontinence or accidents and she did not have any day without a bowel movement and she took Imodium on 4 different occasions.  In 16 days, she had 49 bowel movements and average frequency is 3 per day.  She denies melena or epigastric pain.  In the past, she has tried fiber supplements, but they have not helped.  She is not sure if she has taken Levsin.  She states her diarrhea is not slowing her down.  She remains very active in her community.  She was given 2 weeks trial of Xifaxan, but she says it did not make any difference.  She has good appetite.  Her weight has been stable.  CURRENT MEDICATIONS: 1. Simvastatin 20 mg daily. 2. Verapamil CR 240 mg daily. 3. Prilosec 20 mg daily p.r.n. 4. Tylenol 500 mg q.i.d. p.r.n. 5. MVI daily. 6. Procrit 40,000 units every 5 weeks. 7. Coumadin 2.5 mg daily. 8. Metoprolol 25 mg daily. 9. Os-Cal with D one daily. 10.Iron infusion every 4 months. 11.Lisinopril 10 mg daily. 12.Premarin cream topical application nightly. 13.Nitrofurantoin 100 mg b.i.d. 14.Levsin sublingual p.r.n.  She recently completed  Z-Pak.  OBJECTIVE:  VITAL SIGNS:  Weight 165-1/2 pounds, she is 59 inches tall, pulse 72 per minute, blood pressure 116/74 and temp is 97.4. EYES:  Conjunctivae are pink.  Sclerae are nonicteric. MOUTH:  Oropharyngeal mucosa is normal. NECK:  No neck masses are noted. ABDOMEN:  Full.  Bowel sounds are normal.  On palpation, soft and nontender abdomen without organomegaly or masses. EXTREMITIES:  No peripheral edema or clubbing noted.  ASSESSMENT: 1. [name] has refractory irritable bowel syndrome.  She has undergone     colonoscopy, flexible sigmoidoscopy with biopsy in the past.  She     has extensive diverticulosis.  She has not responded to standard     therapy.  She also did not respond to therapy with Xifaxan.     Unfortunately, she does not really stick to plan for more than few     days, so we really cannot tell whether it will work or not.     Therapy with Xifaxan for longer than 2 weeks would be reasonable,     but cost is  an issue. 2. History of duodenal adenoma.  Two prior EGDs by Dr. Jena Gauss.  No     therapy planned for now for residual polyp.  PLAN: 1. The patient advised to take Imodium 1 mg every morning. 2. She will continue high-fiber diet.  She will go back on Activia and     take at least one cup every day for at least 2 months. 3. She will use glycerin suppository or Dulcolax suppository half to     one daily in the afternoon to see if she could break these cycles     of accidents and severe cramps.  She is encouraged to take     Levsin/SL b.i.d. p.r.n. when she has cramps. 4. The patient will call us with a progress report in 4 weeks and then     we will determine timing of her next visit.     Lionel December, M.D.     NR/MEDQ  D:  05/07/2010  T:  05/07/2010  Job:  [id number]  cc:   Ramon Dredge L. Juanetta Gosling, M.D. Fax: [phone]  Electronically Signed by Lionel December M.D. on 06/16/2010 09:30:57 AM

## 2010-06-17 ENCOUNTER — Other Ambulatory Visit: Payer: Self-pay | Admitting: Cardiology

## 2010-06-18 LAB — CBC
Hemoglobin: 10.9 g/dL — ABNORMAL LOW (ref 12.0–15.0)
MCHC: 34 g/dL (ref 30.0–36.0)
RBC: 3.36 MIL/uL — ABNORMAL LOW (ref 3.87–5.11)

## 2010-06-19 ENCOUNTER — Ambulatory Visit (INDEPENDENT_AMBULATORY_CARE_PROVIDER_SITE_OTHER): Payer: Medicare Other | Admitting: Otolaryngology

## 2010-06-19 DIAGNOSIS — R040 Epistaxis: Secondary | ICD-10-CM

## 2010-06-19 LAB — DIFFERENTIAL
Eosinophils Absolute: 0.3 10*3/uL (ref 0.0–0.7)
Eosinophils Relative: 2 % (ref 0–5)
Lymphs Abs: 9 10*3/uL — ABNORMAL HIGH (ref 0.7–4.0)
Monocytes Absolute: 0.6 10*3/uL (ref 0.1–1.0)
Monocytes Relative: 4 % (ref 3–12)

## 2010-06-19 LAB — CBC
HCT: 32.1 % — ABNORMAL LOW (ref 36.0–46.0)
Hemoglobin: 10.9 g/dL — ABNORMAL LOW (ref 12.0–15.0)
MCV: 93.7 fL (ref 78.0–100.0)
Platelets: 188 10*3/uL (ref 150–400)
WBC: 16.1 10*3/uL — ABNORMAL HIGH (ref 4.0–10.5)

## 2010-06-21 LAB — DIFFERENTIAL
Basophils Absolute: 0.1 10*3/uL (ref 0.0–0.1)
Basophils Relative: 1 % (ref 0–1)
Eosinophils Absolute: 0.4 10*3/uL (ref 0.0–0.7)
Lymphs Abs: 8.5 10*3/uL — ABNORMAL HIGH (ref 0.7–4.0)
Monocytes Absolute: 0.6 10*3/uL (ref 0.1–1.0)
Neutro Abs: 5.1 10*3/uL (ref 1.7–7.7)

## 2010-06-21 LAB — CBC
HCT: 36.8 % (ref 36.0–46.0)
MCHC: 34.2 g/dL (ref 30.0–36.0)
MCV: 92.8 fL (ref 78.0–100.0)
Platelets: 164 10*3/uL (ref 150–400)
RDW: 14.6 % (ref 11.5–15.5)

## 2010-06-22 LAB — CBC
MCHC: 34.2 g/dL (ref 30.0–36.0)
Platelets: 153 10*3/uL (ref 150–400)
RDW: 16.1 % — ABNORMAL HIGH (ref 11.5–15.5)

## 2010-06-23 LAB — CBC
Hemoglobin: 12.6 g/dL (ref 12.0–15.0)
MCHC: 33.7 g/dL (ref 30.0–36.0)
MCV: 91.1 fL (ref 78.0–100.0)
Platelets: 152 10*3/uL (ref 150–400)
RBC: 3.9 MIL/uL (ref 3.87–5.11)
RBC: 4.09 MIL/uL (ref 3.87–5.11)
WBC: 13.6 10*3/uL — ABNORMAL HIGH (ref 4.0–10.5)
WBC: 14.4 10*3/uL — ABNORMAL HIGH (ref 4.0–10.5)

## 2010-06-23 LAB — BASIC METABOLIC PANEL WITH GFR
BUN: 18 mg/dL (ref 6–23)
CO2: 29 meq/L (ref 19–32)
Calcium: 9.4 mg/dL (ref 8.4–10.5)
Chloride: 103 meq/L (ref 96–112)
Creatinine, Ser: 1.03 mg/dL (ref 0.4–1.2)
GFR calc non Af Amer: 50 mL/min — ABNORMAL LOW
Glucose, Bld: 115 mg/dL — ABNORMAL HIGH (ref 70–99)
Potassium: 4 meq/L (ref 3.5–5.1)
Sodium: 141 meq/L (ref 135–145)

## 2010-06-23 LAB — DIFFERENTIAL
Basophils Absolute: 0.1 K/uL (ref 0.0–0.1)
Basophils Relative: 1 % (ref 0–1)
Eosinophils Absolute: 0.3 K/uL (ref 0.0–0.7)
Eosinophils Relative: 2 % (ref 0–5)
Lymphocytes Relative: 61 % — ABNORMAL HIGH (ref 12–46)
Lymphs Abs: 8.8 K/uL — ABNORMAL HIGH (ref 0.7–4.0)
Monocytes Absolute: 0.6 K/uL (ref 0.1–1.0)
Monocytes Relative: 4 % (ref 3–12)
Neutro Abs: 4.6 K/uL (ref 1.7–7.7)
Neutrophils Relative %: 32 % — ABNORMAL LOW (ref 43–77)

## 2010-06-24 LAB — IRON AND TIBC
Iron: 76 ug/dL (ref 42–135)
UIBC: 291 ug/dL

## 2010-06-24 LAB — FERRITIN: Ferritin: 37 ng/mL (ref 10–291)

## 2010-06-24 LAB — CBC
HCT: 40.5 % (ref 36.0–46.0)
Hemoglobin: 13 g/dL (ref 12.0–15.0)
MCHC: 32.1 g/dL (ref 30.0–36.0)
MCV: 90.3 fL (ref 78.0–100.0)
RBC: 4.48 MIL/uL (ref 3.87–5.11)
WBC: 22.6 10*3/uL — ABNORMAL HIGH (ref 4.0–10.5)

## 2010-06-24 LAB — BASIC METABOLIC PANEL
CO2: 21 mEq/L (ref 19–32)
Chloride: 108 mEq/L (ref 96–112)
Creatinine, Ser: 1.82 mg/dL — ABNORMAL HIGH (ref 0.4–1.2)
GFR calc Af Amer: 32 mL/min — ABNORMAL LOW (ref >60)
Potassium: 6.6 mEq/L (ref 3.5–5.1)
Sodium: 135 mEq/L (ref 135–145)

## 2010-06-24 LAB — POTASSIUM: Potassium: 6.3 mEq/L (ref 3.5–5.1)

## 2010-06-25 LAB — DIFFERENTIAL
Basophils Absolute: 0.1 10*3/uL (ref 0.0–0.1)
Basophils Relative: 1 % (ref 0–1)
Eosinophils Relative: 2 % (ref 0–5)
Lymphocytes Relative: 64 % — ABNORMAL HIGH (ref 12–46)
Monocytes Absolute: 0.7 10*3/uL (ref 0.1–1.0)
Neutrophils Relative %: 28 % — ABNORMAL LOW (ref 43–77)

## 2010-06-25 LAB — CBC
Hemoglobin: 12.3 g/dL (ref 12.0–15.0)
MCHC: 32.9 g/dL (ref 30.0–36.0)
RBC: 4.09 MIL/uL (ref 3.87–5.11)
WBC: 14.9 10*3/uL — ABNORMAL HIGH (ref 4.0–10.5)

## 2010-06-25 LAB — HEPATITIS B SURFACE ANTIBODY,QUALITATIVE: Hep B S Ab: NEGATIVE

## 2010-06-26 LAB — FERRITIN: Ferritin: 308 ng/mL — ABNORMAL HIGH (ref 10–291)

## 2010-06-26 LAB — CBC
MCHC: 33.5 g/dL (ref 30.0–36.0)
Platelets: 200 10*3/uL (ref 150–400)
RBC: 3.25 MIL/uL — ABNORMAL LOW (ref 3.87–5.11)
RDW: 18.4 % — ABNORMAL HIGH (ref 11.5–15.5)

## 2010-06-26 LAB — LACTATE DEHYDROGENASE: LDH: 130 U/L (ref 94–250)

## 2010-06-26 LAB — DIFFERENTIAL
Eosinophils Absolute: 0.4 10*3/uL (ref 0.0–0.7)
Monocytes Absolute: 0.6 10*3/uL (ref 0.1–1.0)
Neutrophils Relative %: 40 % — ABNORMAL LOW (ref 43–77)

## 2010-06-30 LAB — CBC
HCT: 30.1 % — ABNORMAL LOW (ref 36.0–46.0)
Hemoglobin: 9.5 g/dL — ABNORMAL LOW (ref 12.0–15.0)
MCHC: 32.7 g/dL (ref 30.0–36.0)
MCV: 92.2 fL (ref 78.0–100.0)
Platelets: 228 10*3/uL (ref 150–400)
Platelets: 237 10*3/uL (ref 150–400)
RDW: 15.7 % — ABNORMAL HIGH (ref 11.5–15.5)
RDW: 15.9 % — ABNORMAL HIGH (ref 11.5–15.5)
WBC: 15.6 10*3/uL — ABNORMAL HIGH (ref 4.0–10.5)

## 2010-06-30 LAB — IGG, IGA, IGM
IgA: 185 mg/dL (ref 68–378)
IgG (Immunoglobin G), Serum: 672 mg/dL — ABNORMAL LOW (ref 694–1618)

## 2010-06-30 LAB — IMMUNOFIXATION ELECTROPHORESIS
IgA: 178 mg/dL (ref 68–378)
IgM, Serum: 27 mg/dL — ABNORMAL LOW (ref 60–263)

## 2010-06-30 LAB — DIFFERENTIAL
Eosinophils Absolute: 0.3 10*3/uL (ref 0.0–0.7)
Eosinophils Relative: 2 % (ref 0–5)
Lymphs Abs: 9 10*3/uL — ABNORMAL HIGH (ref 0.7–4.0)
Monocytes Absolute: 0.6 10*3/uL (ref 0.1–1.0)

## 2010-06-30 LAB — RETICULOCYTES
RBC.: 3.34 MIL/uL — ABNORMAL LOW (ref 3.87–5.11)
Retic Count, Absolute: 50.1 10*3/uL (ref 19.0–186.0)

## 2010-06-30 LAB — PROTEIN ELECTROPH W RFLX QUANT IMMUNOGLOBULINS
Alpha-1-Globulin: 5 % — ABNORMAL HIGH (ref 2.9–4.9)
Alpha-2-Globulin: 13.8 % — ABNORMAL HIGH (ref 7.1–11.8)
Beta 2: 5.2 % (ref 3.2–6.5)
Beta Globulin: 8 % — ABNORMAL HIGH (ref 4.7–7.2)
Gamma Globulin: 10.8 % — ABNORMAL LOW (ref 11.1–18.8)

## 2010-06-30 LAB — IMMUNOFIXATION ADD-ON

## 2010-07-03 ENCOUNTER — Ambulatory Visit (INDEPENDENT_AMBULATORY_CARE_PROVIDER_SITE_OTHER): Payer: Medicare Other | Admitting: *Deleted

## 2010-07-03 DIAGNOSIS — I4891 Unspecified atrial fibrillation: Secondary | ICD-10-CM

## 2010-07-03 DIAGNOSIS — Z7901 Long term (current) use of anticoagulants: Secondary | ICD-10-CM

## 2010-07-09 ENCOUNTER — Ambulatory Visit (INDEPENDENT_AMBULATORY_CARE_PROVIDER_SITE_OTHER): Payer: Medicare Other | Admitting: *Deleted

## 2010-07-09 DIAGNOSIS — I4891 Unspecified atrial fibrillation: Secondary | ICD-10-CM

## 2010-07-09 DIAGNOSIS — Z7901 Long term (current) use of anticoagulants: Secondary | ICD-10-CM

## 2010-07-14 ENCOUNTER — Encounter (HOSPITAL_COMMUNITY): Payer: Medicare Other | Attending: Oncology

## 2010-07-14 DIAGNOSIS — D509 Iron deficiency anemia, unspecified: Secondary | ICD-10-CM | POA: Insufficient documentation

## 2010-07-14 DIAGNOSIS — D649 Anemia, unspecified: Secondary | ICD-10-CM

## 2010-07-14 DIAGNOSIS — C9110 Chronic lymphocytic leukemia of B-cell type not having achieved remission: Secondary | ICD-10-CM | POA: Insufficient documentation

## 2010-07-14 DIAGNOSIS — I4891 Unspecified atrial fibrillation: Secondary | ICD-10-CM

## 2010-07-14 DIAGNOSIS — N189 Chronic kidney disease, unspecified: Secondary | ICD-10-CM

## 2010-07-16 ENCOUNTER — Telehealth: Payer: Self-pay | Admitting: Cardiology

## 2010-07-16 NOTE — Telephone Encounter (Signed)
Patient wants to know what is the hold up with her clearance for injection / we have paperwork / tg

## 2010-07-18 NOTE — Telephone Encounter (Signed)
Clearance given for epidural steroid injection , clearance faxed to [facility]

## 2010-07-23 ENCOUNTER — Ambulatory Visit (INDEPENDENT_AMBULATORY_CARE_PROVIDER_SITE_OTHER): Payer: Medicare Other | Admitting: *Deleted

## 2010-07-23 DIAGNOSIS — Z7901 Long term (current) use of anticoagulants: Secondary | ICD-10-CM

## 2010-07-23 DIAGNOSIS — I4891 Unspecified atrial fibrillation: Secondary | ICD-10-CM

## 2010-07-23 LAB — POCT INR: INR: 2.5

## 2010-07-24 ENCOUNTER — Encounter: Payer: Medicare Other | Admitting: *Deleted

## 2010-07-28 ENCOUNTER — Ambulatory Visit (INDEPENDENT_AMBULATORY_CARE_PROVIDER_SITE_OTHER): Payer: Medicare Other | Admitting: *Deleted

## 2010-07-28 DIAGNOSIS — I4891 Unspecified atrial fibrillation: Secondary | ICD-10-CM

## 2010-07-28 DIAGNOSIS — Z7901 Long term (current) use of anticoagulants: Secondary | ICD-10-CM

## 2010-07-28 LAB — POCT INR: INR: 1.4

## 2010-07-29 NOTE — Procedures (Signed)
NAMEVAEDA, WESTALL NO.:  [id number]   MEDICAL RECORD NO.:  [id number]          PATIENT TYPE:  INP   LOCATION:  A226                          FACILITY:  [facility]   PHYSICIAN:  Gerrit Friends. Dietrich Pates, MD, FACCDATE OF BIRTH:  Jan 18, 1916   DATE OF PROCEDURE:  10/04/2006  DATE OF DISCHARGE:                                ECHOCARDIOGRAM   REFERRING:  Dr. Juanetta Gosling.   CLINICAL DATA:  An 77 year old woman with congestive heart failure,  atrial fibrillation and hypertension.   Aorta 2.4, left atrium 4.7, septum 1.5, posterior wall 1.3, LV diastole  4.0, LV systole 3.2.   1. Technically adequate echocardiographic study.  2. Mild to moderate left and right atrial enlargement.  3. Normal right ventricular size and function; borderline RVH.  4. Normal diameter of the proximal ascending aorta; mild calcification      of the wall.  5. Mild to moderate aortic valvular sclerosis; mild impairment in      leaflet separation; very mild insufficiency; very mild stenosis.  6. Normal mitral valve; moderate annular calcification; mild to      moderate regurgitation.  7. Normal pulmonic valve and proximal pulmonary artery.  8. Normal tricuspid valve; moderate regurgitation; mild elevation in      estimated RV systolic pressure.  9. Normal IVC.  10.Normal left ventricular size; mild hypertrophy; normal regional and      global LV systolic function.      Gerrit Friends. Dietrich Pates, MD, [facility]  Electronically Signed     RMR/MEDQ  D:  10/04/2006  T:  10/05/2006  Job:  [id number]

## 2010-07-29 NOTE — Assessment & Plan Note (Signed)
NAMEMarland Kitchen  LORETTO, BELINSKY                 CHART#:  [id number]   DATE:  09/05/2007                       DOB:  April 02, 1915   PRIMARY CARE PHYSICIAN:  Ramon Dredge L. Juanetta Gosling, MD   CHIEF COMPLAINT:  Diarrhea and abdominal bloating.   PROBLEM LIST:  1. History of irritable bowel syndrome with diarrhea predominance.  2. History of diverticulitis with focal inflammatory changes of all in      the colon to the level of rectosigmoid junction on 03/04/2007 CT.      She was treated with Cipro and Flagyl and responded well.  3. She has a history of duodenal tubulovillous adenoma with last EGD      by Dr. Jena Gauss on 04/29/2007.  She was found to have bulky villous      adenoma, which was partially ablated with APC as it has very      difficult to get to and she was on Coumadin at that time.  4. She has history of atrial fibrillation on chronic Coumadin.  5. She had last colonoscopy was by Dr. Hessie Diener on December 17, 2005,      pancolonic diverticulosis mostly in the sigmoid colon.  Distal      proctitis small benign polyp ablated from the ascending colon.  6. She has had biliary pancreatitis, April 2003.  7. Sphincterotomies for papillary stenosis.  8. Hypertension.  9. Hyperlipidemia.  10.Pacemaker placement November 18, 2006.  11.Status post partial hysterectomy 1972.  12.History of esophageal dysphagia status post dilatation in 1992 and      1992.  13.Neck and back pain status post disk surgery.  14.Chronic anemia.  15.Renal insufficiency.   SUBJECTIVE:  The patient is a 77 year old Caucasian female who presents  with a 1-week history of intermittent diarrhea.  She tells me every time  she eats she has a loose stool.  She has had 4 loose greenish dark  stools today.  She tells me her normal baseline is 3 to 4 times per day  postprandially.  She has been out shopping recently and has barely been  able to make it to the bathroom.  Her weight has remained stable.  She  is on probiotic acidophilus daily.   She is not taking fiber at this  time.  She has not been on any recent antibiotics.  She does take  Imodium, which seems to help at times.  She complains of some  significant amount of gas and bloating.  She has not had fever or chills  and denies any nausea or vomiting.  Her weight is up 9 pounds in the  past 6 months.  She does have a history of glaucoma.   CURRENT MEDICATIONS:  See the list from 09/05/2007.   ALLERGIES:  Detrol LA.   OBJECTIVE:  VITAL SIGNS:  Weight 164 pounds, height 61 inches,  temperature 98.2, blood pressure 130/60, and pulse 72.  GENERAL:  She is a pale-appearing Caucasian female who is alert,  oriented, pleasant, and cooperative in no acute distress.  HEENT.  Sclerae clear.  Nonicteric.  Conjunctivae pink.  Oropharynx pink  and moist without any lesions.  CHEST:  Heart regular rate and rhythm.  Normal S1 and S2.  ABDOMEN:  Protuberant with positive bowel sounds x4.  No bruits  auscultated.  Soft and mildly distended.  She does  have mild tenderness  to the entire abdomen on deep palpation.  There is no rebound,  tenderness, or guarding.  No hepatosplenomegaly or mass, although exam  is limited given the patient's body habitus.  EXTREMITIES:  Without clubbing or edema bilaterally.   ASSESSMENT:  1. The patient is an 77 year old Caucasian female with a 1-week      history of worsening abdominal bloating and diarrhea and      generalized abdominal pain, which is suspicious for Clostridium      difficile colitis or diverticulitis, and I feel she wants further      investigation.  2. She also has history of tubulovillous adenoma and is due for EGD in      September 2009.  We will need to manipulate her Coumadin at that      time.  3. She has an underlying history of irritable bowel syndrome.  4. She has chronic anemia, which may warrant further evaluation.   PLAN:  1. Repeat EGD by Dr. Jena Gauss 11/2007.  We will check an INR 1 week prior      to her EGD.  2.  Stool for Clostridium difficile and culture and sensitivity.  3. Begin Benefiber daily.  4. Begin Imodium daily.  5. Start CBC and CMP.  6. If she does need to be on antibiotics, she will need to take half      of her Coumadin and have her INR checked in 3-5 days.   ADDENDUM:  1. Laboratory studies returned; white blood cell count 13.8,      hemoglobin 10.3, and hematocrit 33.9.  Potassium 5.4 and creatinine      1.47.  I did discuss the patient's symptoms with Dr. Jena Gauss and      these findings.  The plan is to await her stool studies.  2. I encouraged her to drink plenty of fluids.  3. After stool studies, we will follow up with the patient and      determine whether she is going to need antibiotic coverage and      adjust her medications as above.       Lorenza Burton, N.P.  Electronically Signed    ______________________________  Gerrit Friends. Rourk,  MD    KJ/MEDQ  D:  09/06/2007  T:  09/07/2007  Job:  [id number]   cc:   Ramon Dredge L. Juanetta Gosling, M.D.

## 2010-07-29 NOTE — H&P (Signed)
[name] NO.:  [id number]   MEDICAL RECORD NO.:  [id number]          PATIENT TYPE:  INP   LOCATION:  A310                          FACILITY:  [facility]   PHYSICIAN:  Edward L. Juanetta Gosling, M.D.DATE OF BIRTH:  1915/12/20   DATE OF ADMISSION:  12/14/2007  DATE OF DISCHARGE:  LH                              HISTORY & PHYSICAL   [name] was admitted this morning with chest pain.  [name].  She stated that her discomfort started today.  She was asleep.  She woke up from sleep.  It felt like pressure.  It lasted for about an  hour.  She took 2 aspirin, but continued to have the discomfort, called  911, and was brought to the emergency room.  She has not noticed that it  has been aggravated by anything.  She has not had a cough.  She took an  antacid, which did not work.   PAST MEDICAL HISTORY:  Positive for multiple medical problems including  hypertension and hyperlipidemia.  She has had a pacemaker.  She has had  atrial fibrillation.  She has irritable bowel syndrome.  She has had a  Schatzki ring.  She has a history of neck surgery for disk problems in  the past.  She has a recent diagnosis of a chronic lymphocytic leukemia.  She has had a villous adenoma in the colon.  She is on a number of  medications.  She does state that her chest discomfort was not  associated with nausea, vomiting, diaphoresis, or any other associated  symptoms.   PAST SURGICAL HISTORY:  Surgically, she has had back surgery,  cholecystectomy, hysterectomy, neck surgery as mentioned, she has had  bladder surgery, and adenoidectomy.   SOCIAL HISTORY:  She is a widow.  She lives alone.  She does not smoke.  She does not drink any alcohol.  She lives alone, but there is family in  the area.   ALLERGIES:  She lists an allergy to DETROL.   CURRENT MEDICATIONS:  1. Potassium 20 mEq daily.  2. Chlorthalidone 25 mg one-half tablet daily.  3. Lasix 20 mg one-half tablet  daily.  4. Verapamil 240 mg daily.  5. Simvastatin 20 mg daily.  6. Avapro 300 mg daily.  7. Metoprolol 25 mg b.i.d.  8. Warfarin 5 mg daily.  9. Extra-Strength Tylenol as needed.   REVIEW OF SYSTEMS:  Except as mentioned is negative.   PHYSICAL EXAMINATION:  GENERAL:  She is a well-developed, well-nourished  female, who does not appear to be in any acute distress now.  She is  sitting up, able to converse without any difficulty.  VITAL SIGNS:  Her temperature is 97.8, pulse 67, respirations 20, blood  pressure 95/80, and O2 sats 100%.  Her height is 60 inches and weight  76.5 kilos.  HEENT:  Her pupils are reactive.  Her nose and throat are clear.  Mucous  membranes are slightly dry.  NECK:  Supple without masses.  CHEST:  Clear.  No wheezing, no rales, and no rhonchi.  HEART:  Regular without gallop.  ABDOMEN:  Soft.  EXTREMITIES:  No edema.   LABORATORY WORK:  Her BMET, BUN is 37 and creatinine 1.25.  PT 32 and  INR of 2.8.  Cardiac markers negative.   Her chest x-ray shows a pacemaker, bibasilar airspace opacities,  consistent with moderate congestive heart failure, and a question of a  density over the right lung base.   ASSESSMENT:  She has come in with chest pain.  So far, she is negative  as far as infarction is concerned.  She does not have evidence of  myocardial infarction so far and she has multiple other medical  problems.  She is anticoagulated and she has a chest pain.  It may not  be cardiac in nature.  I am going to go ahead and get a D-dimer.  If her  D-dimer is elevated, she needs CT chest and pulmonary embolus protocol.  She has an appointment set up with her electrophysiologist for this  afternoon.  We will try to see if he could see her here.  I am going to  ask [facility] consultation anyway to cycle her enzymes, cycle  her EKGs, and follow.      Edward L. Juanetta Gosling, M.D.  Electronically Signed     ELH/MEDQ  D:  12/14/2007  T:  12/14/2007   Job:  [id number]

## 2010-07-29 NOTE — H&P (Signed)
NAMEHOLLAN, PHILIPP NO.:  [id number]   MEDICAL RECORD NO.:  [id number]          PATIENT TYPE:  INP   LOCATION:  A219                          FACILITY:  [facility]   PHYSICIAN:  Edward L. Juanetta Gosling, M.D.DATE OF BIRTH:  11-19-1915   DATE OF ADMISSION:  11/02/2006  DATE OF DISCHARGE:  LH                              HISTORY & PHYSICAL   Ms. Karapetyan is an 77 year old who came to the emergency room complaining of  being dizzy and feeling like she was going to faint.  She had been seen  in my office yesterday with atrial fibrillation.  She had had an episode  of rapid heart atrial fibrillation and refused to come to the hospital.  She eventually started having more trouble in the night last night.  She  eventually called family for help, came to the emergency room, and was  noted to have atrial fibrillation.  She still felt like she was having  near-syncope, and she was brought into the hospital.  I discussed her  situation with Dr. Dietrich Pates, her cardiologist, yesterday and we had  elected to change her verapamil to a total of 160 mg a day, and then  when she ran out of the 40 mg dose that she was taking to switch her to  a longacting 180 mg dose.  She had just changed that yesterday.   She does not have any chest pain associated with this.  She was not  nauseated.  She simply felt dizzy and like she was going to pass out.  She became quite emotionally upset as well.   PAST MEDICAL HISTORY:  1. Hypertension.  2. Atrial fibrillation.  3. Hyperlipidemia.  4. Gastroesophageal reflux disease.   PAST SURGICAL HISTORY:  1. Adenoidectomy.  2. Back surgery.  3. Cholecystectomy.  4. Hysterectomy.  5. Tonsillectomy.  6. Surgery on her neck.  7. History of a bladder surgery.   SOCIAL HISTORY:  She is a nonsmoker, nondrinker.  She lives at home  alone.  She does not use any illicit drugs.   FAMILY HISTORY:  Her family history is positive for multiple family  members who have  had cardiac disease.   MEDICATIONS:  1. Lasix 20 mg daily.  2. K-Dur 20 mEq daily.  3. Verapamil a total of 160 mg daily.  4. Avapro 150 mg daily.  She had been on 300, but I asked her to cut      it in half because she said the 300 dose made her sick.  5. Coumadin 2.5 mg daily.  6. Zocor, I believe it is 20 mg daily.  7. Os-Cal with D daily.  8. Tylenol Extra Strength daily.  9. Ibuprofen daily 400 mg four times a day as needed.   REVIEW OF SYSTEMS:  Her review of systems otherwise is essentially  negative.  She has had previous near-syncopal episodes.   PHYSICAL EXAMINATION:  VITAL SIGNS:  Today, temperature is 97.1, pulse  62, respirations 18, blood pressure 128/54.  She had orthostatic vital  signs and actually had little change in her blood pressure.  HEENT:  Her pupils are reactive.  Her nose and throat are clear.  She  does have a lesion in the right nostril that bleeds, and I set her up  for an ENT appointment when I saw her in the office yesterday.  Her  mucous membranes are slightly dry.  NECK:  Supple without masses.  HEART:  Irregular without a gallop.  CHEST:  Clear.  ABDOMEN:  Soft.  No masses are felt.  No organs are palpable.  Bowel  sounds are present and active.  EXTREMITIES:  Trace if any edema.  CNS:  Grossly intact.   LABORATORY WORK:  CBC showed white count 14,700, hemoglobin is down to  9.1, platelets 189.  BMET:  BUN is 50, creatinine 1.56.  That was  checked on the 23rd, and it was 24 and 1.45, respectively, on October 06, 2006, so I wonder if she has had a GI bleed.  Cardiac markers are  negative.  INR 2.5, PT 27.9.  Her hemoglobin level last admission was  10.3 and as mentioned has dropped 9.1.   ASSESSMENT:  She has had near-syncope probably related to atrial  fibrillation.  She is a bit more anemic than she was.  Her renal  function is a bit off from the last time, and is not totally clear  exactly the course of events here.  I do not have the  information from  [facility] yet, so I am not sure if she was in a rapid  atrial fibrillation when they came to see her.  She had slow atrial  fibrillation when she came to the emergency room and had her  electrocardiogram done.  I am going to plan to get cardiology  consultation.  Will check stools for blood.  We will give her something  for her anxiety because she is very anxious and follow.      Edward L. Juanetta Gosling, M.D.  Electronically Signed     ELH/MEDQ  D:  11/02/2006  T:  11/02/2006  Job:  [id number]

## 2010-07-29 NOTE — Group Therapy Note (Signed)
NAMEADALIS, GATTI NO.:  [id number]   MEDICAL RECORD NO.:  [id number]          PATIENT TYPE:  INP   LOCATION:  A219                          FACILITY:  [facility]   PHYSICIAN:  Edward L. Juanetta Gosling, M.D.DATE OF BIRTH:  05-06-1915   DATE OF PROCEDURE:  11/06/2006  DATE OF DISCHARGE:  11/06/2006                                 PROGRESS NOTE   Ms. Ziff is better. She says that she feels better and has no new  complaints.  She  did have some pounding last night in her heart, but  all the monitor showed was a PVC.  She is otherwise feeling well.   PHYSICAL EXAMINATION:  Shows temperature is 96.9, pulse 76, respirations  20, blood pressure 138/65 and 02 sat is 98%. She has had no further  episodes of near syncopal feelings,   Her BMET shows BUN 26, creatinine 1.23, potassium 3.9.   ASSESSMENT:  I think she is better.   PLAN:  I am going to go ahead discharge her today.  Please see discharge  summary for details.  We are going to try to get her an event monitor.      Edward L. Juanetta Gosling, M.D.  Electronically Signed     ELH/MEDQ  D:  11/06/2006  T:  11/07/2006  Job:  [id number]

## 2010-07-29 NOTE — Assessment & Plan Note (Signed)
[facility]                         ELECTROPHYSIOLOGY OFFICE NOTE   MACKENNA, KAMER                       MRN:          [id number]  DATE:06/28/2008                            DOB:          1915-03-20    Ms. Boehne returns today for followup.  She is a very pleasant woman with  a history of symptomatic bradycardia with preserved LV function status  post permanent pacemaker insertion back in September 2008.  She returns  today for followup.  She denies chest pain or shortness of breath.  She  does admit to her blood pressure being elevated at times.   CURRENT MEDICATIONS:  1. Metoprolol 50 twice a day.  2. Verapamil 240 a day.  3. Lasix 20 mg daily except an additional 10 mg if her weight is      increased.  4. Avapro 300 a day.  5. Warfarin 5 mg daily.  6. Multiple vitamins.  7. Simvastatin 20 mg daily.  8. Omeprazole 40 a day.   PHYSICAL EXAMINATION:  GENERAL:  She is a pleasant woman in no acute  distress.  VITAL SIGNS:  Blood pressure was 155/78, pulse was 92 and regular,  respirations were 18, the weight was 164 pounds.  NECK:  No jugular venous distention.  LUNGS:  Clear bilaterally to auscultation.  No wheezes, rales, or  rhonchi are present.  CARDIOVASCULAR:  Regular rate and rhythm.  Normal S1 and S2.  She did  have had some grade 2/6 murmur of aortic sclerosis.  ABDOMEN:  Soft, nontender.  EXTREMITIES:  No edema.   Interrogation of her pacemaker; the patient has a Oncologist.  Estimated longevity is approximately 8 years.  The pacing impedances  were 407 in the atrium and 590 in the ventricle.  She was in AFib less  than 1% of the time.  The longest episode appeared to be about 3 hours.  The pacing threshold 0.5 at 0.4 in the right ventricle.   IMPRESSION:  1. Symptomatic bradycardia.  2. Status post pacemaker insertion.  3. Hypertension.   DISCUSSION:  Overall, Ms. Kunde is stable.  Her pacemaker is working  normally.   I will plan to see the patient back for pacemaker followup in  approximately 1 year.     Doylene Canning. Ladona Ridgel, MD  Electronically Signed    GWT/MedQ  DD: 06/27/2008  DT: 06/28/2008  Job #: [id number]

## 2010-07-29 NOTE — Group Therapy Note (Signed)
NAMETAYLORANN, TKACH NO.:  [id number]   MEDICAL RECORD NO.:  [id number]          PATIENT TYPE:  INP   LOCATION:  A219                          FACILITY:  [facility]   PHYSICIAN:  Edward L. Juanetta Gosling, M.D.DATE OF BIRTH:  1915/11/13   DATE OF PROCEDURE:  DATE OF DISCHARGE:                                 PROGRESS NOTE   Ms. Navarrette was admitted with atrial fibrillation and near syncope.  She  has been on verapamil but her heart rate drops.  She has had dizzy  episodes with her heart rate dropping but is not totally clear what is  related to what.  She does not seem to have a great deal of decrease in  her blood pressure when she stands but with her heart rate was low, she  was hypotensive.  She may well require a pacemaker and I have told her  that.  I have told this decision will be left to cardiologist.   EXAM:  Otherwise she is still in atrial fibrillation.  Her blood  pressure is about 120, pulse 80.  Chest is fairly clear.   Her white count is 12,800, hemoglobin is down to 8.8.  Her BUN is 51,  creatinine 1.71, potassium is 5.9.   ASSESSMENT:  She has atrial fibrillation with slow ventricular response.   I am going to stop her verapamil and see if we can make a difference  with this heart rate and maybe make a difference with her blood  pressure.  I do not plan to change any other treatments at this point.      Edward L. Juanetta Gosling, M.D.  Electronically Signed     ELH/MEDQ  D:  11/03/2006  T:  11/03/2006  Job:  [id number]

## 2010-07-29 NOTE — Group Therapy Note (Signed)
[name], [name] NO.:  [id number]   MEDICAL RECORD NO.:  [id number]          PATIENT TYPE:  INP   LOCATION:  A219                          FACILITY:  [facility]   PHYSICIAN:  Edward L. Juanetta Gosling, M.D.DATE OF BIRTH:  [birth year]   DATE OF PROCEDURE:  11/04/2006  DATE OF DISCHARGE:                                 PROGRESS NOTE   [name] says that she feels much better today.  She says her hands have  been cold for several months and now they are warm.  She did not have  any episodes of low heart rate and dizziness last night and says that  she is feeling much better.   PHYSICAL EXAMINATION:  Shows her temperature is 98.6, pulse is 76,  respirations 20, blood pressure 123/60, O2 sats 98% on room air.  She  does not have any significant orthostatic changes.  Her chest is much clearer.  She remains in atrial fibrillation with a good heart rate.  Her abdomen is soft.   ASSESSMENT:  She has atrial fibrillation, near syncope, probably from a  bradycardia. She does have some element of sick sinus syndrome.  She has  gastroesophageal reflux disease, which is stable and she has chronic  arthritic changes, stable.  She has chronic dizziness, stable.   PLAN:  My plan then is to continue with her current treatments and  medications and try to get her up and moving around today.  I am  concerned that she may develop a rapid heartbeat now that she is off of  the verapamil; we need to make sure that she is okay.      Edward L. Juanetta Gosling, M.D.  Electronically Signed     ELH/MEDQ  D:  11/04/2006  T:  11/04/2006  Job:  [id number]

## 2010-07-29 NOTE — Assessment & Plan Note (Signed)
[facility]                       [place] CARDIOLOGY OFFICE NOTE   [name], [name]                       MRN:          [id number]  DATE:04/13/2008                            DOB:          [birth year]    CARDIOLOGIST:  Gerrit Friends. Dietrich Pates, MD, [facility]   PRIMARY CARE PHYSICIAN:  Oneal Deputy. Juanetta Gosling, MD   REASON FOR VISIT:  One-week followup.   HISTORY OF PRESENT ILLNESS:  [name] is a 77 year old female patient  with a history paroxysmal atrial fibrillation, tachybrady syndrome,  status post pacemaker implantation on chronic Coumadin therapy, who  underwent cardiac catheterization on September 2009 that demonstrated  moderate nonobstructive disease which was treated medically.  She had  some diastolic congestive heart failure at that time.  I saw her on September 04, 2008, when she presented with an episode of shortness of breath.  I  set her up for some lab work and a chest x-ray and asked her to continue  on Lasix 20 mg a day.  Her chest x-ray returned demonstrating no active  lung disease.  Her labs demonstrated a stable hemoglobin at 10, BUN 34,  and creatinine of 1.33 which was stable.  Her BNP was 258.2.  She  returns today for followup.  Unfortunately, she went back to 10 mg a day  of Lasix.  She is up 2 pounds since her last visit.  She continues to  have dyspnea on exertion.  She describes NYHA class IIB-III symptoms.  She sleeps on 1 pillow.  She denies any paroxysmal nocturnal dyspnea.  Her pedal edema is stable.  She denies any syncope.  She does note an  episode of chest discomfort that awoke her from sleep about 2 nights  ago.  There are no associated symptoms with this.  She took 1  nitroglycerin that resolved her symptoms.   CURRENT MEDICATIONS:  Metoprolol 25 mg daily, Verapamil 240 mg daily,  Lasix 20 mg half-tablet daily, Avapro 300 mg daily, Warfarin as  directed, Multivitamin daily, Vitamin B12, Calcium, Simvastatin 20 mg  nightly, Omeprazole 40 mg daily, Sodium bicarbonate, Chlorthalidone 12.5  mg daily, Probiotic, Ferretts (?) 150 mg Monday, Wednesday, and Friday,  Aranesp every third week, Aspirin 81 mg daily, Hydrocodone p.r.n.,  Nitroglycerin p.r.n.   PHYSICAL EXAMINATION:  GENERAL:  She is a well-nourished and well-  developed female in no acute distress.  VITAL SIGNS:  Blood pressure 130/60, pulse 64, weight 169 pounds, which  is up 2 pounds from last visit.  HEENT:  Normal.  NECK:  Without JVD.  CARDIAC:  Normal S1 and S2.  Regular rate and rhythm with a 2/6 systolic  ejection murmur best at the left upper sternal border.  LUNGS:  Clear to  auscultation bilaterally.  No rales.  ABDOMEN:  Soft and nontender.  EXTREMITIES:  With trace to 1+ edema bilaterally.  NEUROLOGIC:  She is alert and oriented x3.  Cranial nerves II through  XII are grossly intact.   Electrocardiogram reveals AV pacing with ventricular rate of 69.   ASSESSMENT AND PLAN:  1. Acute on chronic diastolic congestive heart  failure in a 64-year-      old female patient with an ejection fraction of 55-60% by      echocardiogram in September 2009 in the setting of mild aortic      stenosis with a mean gradient of 80 mmHg.  She continues to have      dyspnea with exertion.  This is overall unchanged.  I also had Dr.      Dietrich Pates to see the patient today.  From a volume standpoint, she      is probably stable.  She may need some extra diuresis and therefore      we have decided to go up on Lasix to 20 mg a day.  She will have a      BMET in 1 week to reassess her renal function and potassium.  If      she needs further diuresis, we may consider changing her Lasix in      the future to 20 mg a day with 40 mg on 3 days of the week.  2. Chest pain.  This is somewhat atypical.  It was relieved by      nitroglycerin recently.  I have reviewed her cardiac      catheterization with Dr. Dietrich Pates and he also saw the patient.  We      do not  feel that the right coronary artery lesion is likely to      cause her any chest symptoms.  She can continue p.r.n.      nitroglycerin for now.  No further workup is planned at this time.  3. Chronic lymphocytic leukemia.  The patient recently had a bone      marrow biopsy done and continues to have right sacral pain.  I have      asked to further follow up with Dr. Mariel Sleet for this.  She      apparently had a CT scan recently.  It did not show any bleeding.  4. Paroxysmal atrial fibrillation with tachycardia-bradycardia      syndrome, status post pacemaker implantation.  She continues on      Coumadin therapy.  5. Chronic kidney disease.  Her creatinine is stable.  We will follow      this closely with increased diuresis.   DISPOSITION:  The patient will follow up with Dr. Dietrich Pates in 1 month or  sooner p.r.n.      Tereso Newcomer, PA-C  Electronically Signed      Gerrit Friends. Dietrich Pates, MD, [facility]  Electronically Signed   SW/MedQ  DD: 04/13/2008  DT: 04/14/2008  Job #: [id number]   cc:   Ramon Dredge L. Juanetta Gosling, M.D.

## 2010-07-29 NOTE — Op Note (Signed)
[name], PROUSE                ACCOUNT NO.:  [id number]   MEDICAL RECORD NO.:  [id number]          PATIENT TYPE:  AMB   LOCATION:  DAY                           FACILITY:  [facility]   PHYSICIAN:  R. Roetta Sessions, M.D. DATE OF BIRTH:  07-18-1915   DATE OF PROCEDURE:  04/29/2007  DATE OF DISCHARGE:                               OPERATIVE REPORT   PROCEDURE PERFORMED:  Esophagogastroduodenoscopy with biopsy and APC  ablation.   INDICATIONS FOR PROCEDURE:  77 year old lady found have a villous  adenoma straddling D1 and D2 back on March 23, 2007.  Biopsy did not  reveal neoplasia.  It was felt this was more or less a flat lesion and  it may be treatable with serial APC treatment.  This approach has been  discussed with Ms. Puett.  The potential risks, benefits and alternatives  have been reviewed, questions answered, she is agreeable, please see  documentation in the medical record.  Her Coumadin was stopped on  April 25, 2007.  Her INR today is 1.1.   PROCEDURE NOTE:  O2 saturation, blood pressure, pulse, and respirations  were monitored the entire procedure.  Conscious sedation with Versed 3  mg IV and Demerol 75 mg IV in divided doses, Glucagon 0.5 mg IV a single  dose to rest small bowel motility.  The instrument Pentax video chip  system.   FINDINGS:  Examination of the tubular esophagus revealed no mucosal  abnormalities.  The EG junction easily traversed.  The gastric cavity  was emptied and insufflated well with air.  A thorough examination of  the gastric mucosa including retroflex view of the proximal stomach and  esophagogastric junction demonstrated a small hiatal hernia and some  prepyloric antral scarring and a 4 mm circular nodularity in the antrum,  there was no obvious infiltrating process.  The pylorus was patent and  easily traversed.  Examination of the bulb and second portion again  revealed an adenomatous appearing lesion, possibly a good 2 by 3 cm in  dimensions.  It was very difficult to get at, it was right on the  flexion between the bulb and the second portion, the total extent was  difficult to see. The remainder of the duodenum including the second  portion appeared normal.   THERAPEUTIC/DIAGNOSTIC MANEUVERS PERFORMED:  The APC at 20 joules was  utilized to ablate this lesion.  The total extent of it was difficult to  get at.  A good part of this lesion was painted with the APC in circular  prep without difficulty.  Subsequent biopsies were taken.  The nodular  antrum was biopsied.  The patient tolerated the procedure well and was  reacted in endoscopy.   IMPRESSION:  Noncritical Schatzki's ring, otherwise, normal esophagus,  small hiatal hernia.  A scar in the prepyloric antral mucosa.  A subtle  circular nodular area of uncertain significance, antrum biopsied.  Previous noted villous adenoma again seen.  This looked a little bulkier  than previously appreciated.  It was partially ablated with the APC.  Biopsies were taken.  This was a very difficult  lesion to get at.   RECOMMENDATIONS:  1. Will follow up on path.  2. Further recommendations to follow.  3. Resume Coumadin May 01, 2007.      Jonathon Bellows, M.D.  Electronically Signed     RMR/MEDQ  D:  04/29/2007  T:  04/30/2007  Job:  [id number]   cc:   Gerrit Friends. Dietrich Pates, MD, [facility]  [address]  [place], [place] [address]   Oneal Deputy. Juanetta Gosling, M.D.  Fax: [phone]

## 2010-07-29 NOTE — Letter (Signed)
October 03, 2007    Ramon Dredge L. Juanetta Gosling, MD  [address]  [place], [place] [address]   RE:  REYNALDA, CANNY  MRN:  [id number]  /  DOB:  Aug 30, 1915   Dear Renae Fickle,   Ms. Chatwin returns to the office as scheduled for continued assessment and  treatment of multiple issues including sick sinus syndrome, aortic valve  disease, and hypertension.  Since her last visit, she has done superbly.  She recently toured [employer] without difficulty.  She reports no  dyspnea and no orthostatic dizziness.  She did experience an episode of  moderately-severe chest discomfort a week or so ago that felt like  pressure or tightness.  This was intermittent over the course of number  of minutes and then resolved spontaneously.  She did not seek medical  attention.   Current cardiac medications include:  1. Metoprolol 50 mg daily.  2. Verapamil 240 mg daily.  3. Avapro 300 mg daily.  4. Warfarin as directed.  5. Furosemide 10-20 mg p.r.n.  6. Simvastatin 20 mg daily.   PHYSICAL EXAMINATION:  GENERAL:  A very pleasant, sharp, and  nonagenarian.  VITAL SIGNS:  The weight is 164, 6 pounds more than in January 2009,  blood pressure 130/70, heart rate 95 and regular, and respirations 14.  NECK:  No jugular venous distention; transmitted murmur bilaterally.  LUNGS:  Minimal basilar rales.  CARDIAC:  Normal first and second heart sounds; grade 2/6 early peaking  systolic ejection murmur at the cardiac base.  ABDOMEN:  Soft and nontender; no organomegaly.  EXTREMITIES:  Trace edema.   EKG:  AV sequential pacing.  When compared to a prior tracing of  December 13, 2006, atrial fibrillation is no longer present.   IMPRESSION:  Ms. Fraiser is doing well overall except for an episode of  worrisome chest discomfort.  We will proceed with a pharmacologic stress  test.  A chemistry profile and CBC will be obtained in 3 months.  These  tests were excellent just 5 days ago.  I will plan to see this nice  woman again in 6  months, if her stress test is negative.    Sincerely,      Gerrit Friends. Dietrich Pates, MD, [facility]  Electronically Signed    RMR/MedQ  DD: 10/03/2007  DT: 10/04/2007  Job #: [id number]

## 2010-07-29 NOTE — Group Therapy Note (Signed)
NAMEMAZELLA, DEEN NO.:  [id number]   MEDICAL RECORD NO.:  [id number]          PATIENT TYPE:  INP   LOCATION:  A310                          FACILITY:  [facility]   PHYSICIAN:  Edward L. Juanetta Gosling, M.D.DATE OF BIRTH:  October 10, 1915   DATE OF PROCEDURE:  DATE OF DISCHARGE:                                 PROGRESS NOTE   Ms. Mixon is complaining of having some chest pressure this morning.  This is similar to what she had when she came into the hospital.  She  has no other new complaints.   Her physical examination shows, she is awake and alert.  Her temperature  is 97.1, pulse 69, respirations 20, blood pressure 139/64.  She thinks  that the problem may be some anxiety.  Her O2 sat is 98% on 2 L.   Lab work shows white count is 15,000, hemoglobin is 8.3, this is  consistent of course with her known chronic lymphocytic leukemia.  Protime is 20.3, INR 1.7, BUN 31, creatinine 1.39, potassium 4.9.  TSH  was 2.78 normal.   ASSESSMENT:  She very well may have some anxiety.  She does have some  chest pressure.  She has chronic lymphocytic leukemia.  She is being  prepared for probable cardiac catheterization.  I do not plan to change  anything else.  I am going to go ahead and get an EKG.  I am going to  give her low-dose Ativan.      Edward L. Juanetta Gosling, M.D.  Electronically Signed     ELH/MEDQ  D:  12/19/2007  T:  12/19/2007  Job:  [id number]

## 2010-07-29 NOTE — Assessment & Plan Note (Signed)
[facility]                                 ON-CALL NOTE   [name], [name]                       MRN:          [id number]  DATE:11/20/2006                            DOB:          05-31-1915    ON CALL PHONE NOTE:  November 20, 2006.   This is a patient of Dr. Marvel Plan.   [name] was just discharged home earlier today after an  electrophysiological procedure.  She noted that on her discharge  medication list Lasix 40 mg daily was listed, however she notes that  prior to admission she was taking just 10 mg daily.  I advised her to  take her previous dose of 10 mg daily.     Nicolasa Ducking, ANP  Electronically Signed    CB/MedQ  DD: 11/20/2006  DT: 11/20/2006  Job #: [id number]   cc:   Gerrit Friends. Dietrich Pates, MD, [facility]

## 2010-07-29 NOTE — H&P (Signed)
[name], [name] NO.:  [id number]   MEDICAL RECORD NO.:  [id number]          PATIENT TYPE:  INP   LOCATION:  6533                         FACILITY:  [facility]   PHYSICIAN:  Doylene Canning. Ladona Ridgel, MD    DATE OF BIRTH:  07-09-1915   DATE OF ADMISSION:  11/17/2006  DATE OF DISCHARGE:                              HISTORY & PHYSICAL   CARDIOLOGIST:  Gerrit Friends. Dietrich Pates, MD, [facility].   PRIMARY CAREGIVER:  Oneal Deputy. Juanetta Gosling, M.D.   [name] has intolerance to DETROL.   ELECTROPHYSIOLOGIST:  Doylene Canning. Ladona Ridgel, M.D.   PRESENTING CIRCUMSTANCE:  There are times when I feel as if I am going  to pass out.   HISTORY OF PRESENT ILLNESS:  [name] is an 77 year old female.  [name] has  no prior history of coronary artery disease, but [name] has been feeling  dizzy.  [name] relates at least three episodes when [name] strongly felt that  [name] would pass out.  [name] never has, however.  In fact, with these  episodes [name] felt that [name] was in grave danger. One of the episodes  happened November 16, 2006.  [name] was wearing a monitor.  Monitor  surveillance noted heart rate in the 20s.  They called her at home and  found that [name] was in the throes of one of those episodes.  [name] was  instructed by Dr. Marvel Plan office to come to [facility], and  [name] has arrived on the afternoon of November 17, 2006.   The patient says [name] has been feeling dizzy for several years.  [name]  first came to cardiology attention October 04, 2006.  At that time, [name]  went to the emergency room.  [name] fell as if her heart were exploding.  [name] was found to be in atrial fibrillation, rapid ventricular rate.  This was a new diagnosis.  Echocardiogram at that time showed preserved  ejection fraction.  The BNPs were in the  200-300 range, indicating some  possible diastolic heart failure.  During that hospitalization, [name] was  treated with beta blockers which indicated some evidence of sick sinus  syndrome.  The patient is also found to  be mildly orthostatic, and so it  is thought that her dizziness may be a chronic orthostasis, but also had  episodes of profound symptomatic bradycardia.   In July, the patient was started on Coumadin and Lasix.   [name] had a second hospitalization in mid-August 2008 after one of her  very pronounced dizzy spells which actually cause some alarm for the  patient.  In mid-August 2008, after that hospitalization, [name] started to  wear a heart monitor.  Overall, the strips do not show rapid ventricular  rates but they did indicate a symptomatic episode of breath bradycardia,  as mentioned above.   PAST MEDICAL HISTORY:  Includes risk factors for coronary artery  disease, hypertension, and dyslipidemia.   MEDICATIONS THE PATIENT HAS BEEN TAKING:  1. Simvastatin 20 mg at bedtime.  2. Potassium chloride 20 mEq daily.  3. Verapamil 180 mg daily.  4. Coumadin 2.5 mg daily.  5. Avapro 150 mg daily.  6. Calcium with vitamin D daily.  7. Lasix 20 mg daily.  8. Prevacid 30 mg daily.   SOCIAL HISTORY:  The patient lives in [place].  [name] lives  independently.  Her husband died in the 28s.  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  [name] does not  partake of alcoholic beverages or smoke tobacco products.   FAMILY HISTORY:  Mother died at age 8 of colon cancer.  Father died at  age 32 of lung cancer.  [name] has one sister who died of leukemia.  [name]  has one brother and sister still living, alive and well.   REVIEW OF SYSTEMS:  On the evening when [name] came to medical attention in  August 2008, [name] had been feeling incredibly terrible.  [name] had chills  and extreme dizziness.  HEENT: The patient has episodes of epistaxis.  [name] has had them chronically. [name] apparently has a crusting sore on the  septum of the right naris which will grow and then slough off which  causes bleeding.  This bleeding has been worse now that [name] is on  Coumadin.  INTEGUMENT:  No  rashes or nonhealing ulcerations.  CARDIOPULMONARY:  The patient has been short of breath.  [name] says he  gets short of breath with any exertion.  No orthopnea.  No paroxysmal  nocturnal dyspnea.  No appreciable edema.  [name] does has presyncope, as  described in the history of present illness, and at times noticing her  heart racing.  UROGENITAL:  The patient does have urinary urgency.  [name]  failed Detrol.  NEUROPSYCHIATRIC:  The patient is very weak and tired  most the time.  GI:  The patient has gastroesophageal reflux.  No  history of GI bleeding.  ENDOCRINE:  No history of thyroid disease or  diabetes.  MUSCULOSKELETAL:  Arthritis consistent with aging.  All other  systems are negative.   PHYSICAL EXAMINATION:  GENERAL:  The patient is alert and oriented x3.  No acute distress, and very talkative.  HEENT: Normocephalic, atraumatic.  Eyes:  Pupils equal, round, and  reactive to light.  Extraocular movements are intact.  The sclerae are  clear.  There is some slight evidence the patient has had recent  bleeding from the right naris.  NECK:  Supple, without carotid bruits auscultated.  No jugular venous  distention.  HEART:  Irregular rate and rhythm, without murmur.  LUNGS:  Clear to auscultation and percussion bilaterally.  ABDOMEN:  Soft, nondistended.  Bowel sounds are present.  EXTREMITIES:  Show no evidence of clubbing, cyanosis, or edema.  No  rashes or lesions.  MUSCULOSKELETAL:  Mild kyphosis.  No effusions or  scoliosis.  NEUROLOGIC:  Grossly intact.   IMPRESSION:  1. Admitted with dizziness/feeling of impending doom.      a.     Symptomatic bradycardia showed on the monitor, with left       bundle branch pattern.      b.     History of previous sick sinus syndrome/tachybrady syndrome,       especially on beta blockers.  2. New diagnosis of atrial fibrillation in July 2008, with rapid      ventricular rate.      a.     Apparently, the patient had an exacerbation of  diastolic       congestive heart failure during that admission.  3. Readmitted in August 2008  with dizziness and a sick feeling and      chills.  After that admission, the patient was assigned to wear a      cardiac monitor.  4. MR of the brain on November 16, 2006.  Study showed mild age-      related atrophy, small-vessel disease apparent in the left parietal      region.  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  The      intracranial vascular structures are patent.  No acute infarct      noted.  5. Hypertension.  6. Osteoarthritis.  7. Hyperlipidemia  8. Gastroesophageal reflux disease.  9. Mild chronic renal insufficiency.  10.Irritable bowel syndrome, with both constipating and diarrhea      features.  11.Rosacea.  12.Anemia of chronic disease.  13.Previous ERCP with pancreatitis.  14.Diverticulosis.  15.Sick sinus syndrome, on beta blockers.  16.Left bundle branch block.   The patient will be admitted.  Her verapamil will be on hold.  [name] was  started on Prevacid 30 mg daily.  [name] will continue her statin, Lasix,  and potassium.  [name] will also continue her Coumadin 2.5 mg daily.  Laboratory studies will be a CBC, BMET, TSH, a stat pro time and INR,  and also one in the morning.  The patient will have Xanax 0.25 mg to  give before pacemaker implantation.  We are asking that the patient be  kept n.p.o. on the morning of November 18, 2006, pending possible  pacemaker implantation.      Maple Mirza, PA      Doylene Canning. Ladona Ridgel, MD  Electronically Signed    GM/MEDQ  D:  11/17/2006  T:  11/18/2006  Job:  [id number]

## 2010-07-29 NOTE — Discharge Summary (Signed)
[name], [name] NO.:  [id number]   MEDICAL RECORD NO.:  [id number]          PATIENT TYPE:  INP   LOCATION:  3707                         FACILITY:  [facility]   PHYSICIAN:  Verne Carrow, MDDATE OF BIRTH:  November 04, 1915   DATE OF ADMISSION:  12/19/2007  DATE OF DISCHARGE:  12/23/2007                               DISCHARGE SUMMARY   PRIMARY CARDIOLOGIST:  Gerrit Friends. Dietrich Pates, MD, [facility]   PRIMARY CARE PHYSICIAN:  [name] Deputy. Juanetta Gosling, MD   DISCHARGE DIAGNOSES:  1. Chest pain felt to be noncardiac, unclear etiology at this time,      however is reproducible with palpation.  2. Chronic anticoagulation therapy.  Subtherapeutic INR at the time of      discharge at 1.2.  The patient is to continue 5 mg of warfarin      daily.  Followup PT/INR next Tuesday, December 27, 2007, at [facility].  Results to be faxed to [name] at [place]      office.   PAST MEDICAL HISTORY:  1. Paroxysmal atrial fib on chronic Coumadin therapy.  2. History of tachybrady syndrome status post Medtronic dual-chamber      pacemaker.  3. Hyperlipidemia.  4. Recently diagnosed chronic lymphocytic leukemia based on ongoing      evaluation by [name].  5. Stress Myoview in September revealed an EF of 41% with anterior      septal/anterior apical defect in the setting of left bundle-branch      block pattern and paced rhythm.  6. Cardiac murmur consistent with aortic stenosis, previous diagnosis      of mild aortic stenosis based on echocardiogram in 2008.  7. Irritable bowel syndrome.  8. GERD, previous esophageal dilatation.  9. Previous C-spine surgery.  10.Previous ERCP and sphincterectomy due to biliary pancreatitis and      previous cholecystectomy.  11.Mild renal insufficiency.  12.Chronic anemia.   HOSPITAL COURSE:  [name] is a 77 year old female followed by Dr.  Dietrich [name] at [place] office, who presented to [facility]  complaining of chest  discomfort and shortness of breath.  Past medical  history as noted above.  Recent stress Myoview reviewed by Dr. Diona Browner.  The patient was admitted.  Cardiac enzymes were reassuring with a  troponin I level of less than 0.05.  BNP of 147.  The patient with chest  pain and shortness of breath with no clearly documented history of  obstructive coronary artery disease, EF 41% in the setting of left  bundle-branch block pattern and paced rhythm.  The patient was admitted  for observation and noted to have increased shortness of breath.  The  patient ultimately transferred to [facility] for further  evaluation and found to have a hemoglobin of 7.5.  She was given 1 unit  packed RBCs.  Coumadin was put on hold in preparation for cardiac  catheterization.  [name]'s hemoglobin improved to 9.3.  She was also  noted to be in mild diastolic heart failure resolved with Lasix, atrial  fib rate controlled, ventricular pacing.  The patient underwent cath on  December 21, 2007, and found to have nonobstructive disease, mild LV end-  diastolic pressure elevation.   Dr. Clifton James in to see the patient on the day of discharge.  The patient  with nonobstructive CAD and CLL being discharged to home.  Follow up  with [name].  I have scheduled her appointment on January 02, 2008, at 10:30, scheduled her appointment for CBC, BMET, and PT/INR at  [facility] on December 27, 2007.  She has a prescription to give  to the lab for this blood.   At the time of discharge, H&H 9.1 and 27, WBCs 12.6, platelets 202,000.  Potassium 3.6, BUN and creatinine 26 and 1.6, INR 1.2.   Medications at the time of discharge will be unchanged from prior.  This  include:  1. Aspirin 81.  2. Furosemide has been actually decreased to 10 mg daily.  3. Potassium 20 mEq daily.  4. Chlorthalidone 12.5 daily.  5. Verapamil 240 daily.  6. Pravastatin 20 mg daily.  7. Avapro 300 mg daily.  8. Benicar 40.  9.  Lopressor 25 b.i.d.  10.Warfarin 5 mg daily or as directed by office in [place].  11.Protonix 40 mg daily.  12.Hygroton 12.5 mg daily.   The patient has been given the post cardiac catheterization discharge  instructions, encouraged to follow up with [name] as stated.  Blood work on Tuesday.  Follow up with [name] as previously  recommended.   Duration of discharge encounter well over 30 minutes.      [name], ACNP      Verne Carrow, MD  Electronically Signed    MB/MEDQ  D:  12/23/2007  T:  12/23/2007  Job:  [id number]   cc:   [name], M.D.

## 2010-07-29 NOTE — H&P (Signed)
NAMEEMRIE, GAYLE NO.:  [id number]   MEDICAL RECORD NO.:  [id number]           PATIENT TYPE:   LOCATION:                                 FACILITY:   PHYSICIAN:  Maple Mirza, PA   DATE OF BIRTH:  10-18-1915   DATE OF ADMISSION:  11/18/2006  DATE OF DISCHARGE:  11/20/2006                              HISTORY & PHYSICAL   The final dictation greater than 35 minutes.   This patient has ALLERGIES AND INTOLERANCE TO DETROL.   FINAL DIAGNOSES:  1. Discharging day 2 status post implantation of Medtronic SENSIA dual-      chamber pacemaker.  2. Symptomatic tachy/brady with syncope.  3. Paroxysmal atrial fibrillation/chronic Coumadin.  4. Her bradycardia correlates with presyncopal episodes on CardioNet      monitor.   SECONDARY DIAGNOSES:  1. Hypertension.  2. Osteoarthritis.  3. Hyperlipidemia.  4. Gastroesophageal reflux disease.  5. Mild chronic renal insufficiency.  6. Irritable bowel syndrome with both constipation and diarrhea      elements.  7. Rosacea.  8. Anemia of chronic disease.  9. Diverticulosis.  10.Sick sinus syndrome on beta-blockers.  11.The patient has a left bundle branch block.   PROCEDURES:  November 18, 2006, implant of Medtronic SENSIA dual-chamber  pacemaker, Dr. Lewayne Bunting, for symptomatic tachybradycardia.  The  patient has had no postprocedural complications.  She was kept at [facility] an extra day due to her advanced age to allow her to  regain some strength.   BRIEF HISTORY:  Ms. Island is an 77 year old female.  She has no evidence  of coronary artery disease in her history, but she has been feeling  dizzy.  She relates at least 3 episodes where she strongly felt that she  would pass out.  In fact, with these episodes she felt as if she were in  grave danger.  One of the episodes happened November 16, 2006.  At that  time she was wearing a monitor.  The monitor surveillance noted that her  heart rate was  in the 20s.  They called her at that time and she was in  throws of one of her extreme episodes.  She was instructed by Dr.  Marvel Plan office to come to [facility].   The patient says she had been feeling dizzy for several years.  She came  to cardiology attention July 21st.  She fell as if her heart were  exploding  and came to the emergency room.  At that time she was found  to be in atrial fibrillation, rapid ventricular rate.  Echocardiogram  showed preserved ejection fraction.  BNPs  were in the 200-300 range.  indicating some possible diastolic heart failure.  There was evidence of  sick sinus syndrome related to application of beta-blockers.  At that  time the patient was started on Coumadin and Lasix.  The patient has no  prior history of myocardial infarction or cerebrovascular accident.   After a second hospital admission in mid August, she started wearing a  heart monitor.  The heart monitor fails  to show rapid ventricular rate,  but it does show a symptomatic episode of bradycardia as mentioned  above.   HOSPITAL COURSE:  The patient presented to [facility] with  presyncopal symptoms which correlated with bradycardia on her CardioNet  monitor.  She underwent implantation of her dual-chamber pacemaker the  same day.  The admission date was September 3rd.  The patient was  admitted to [facility] on September 3rd.  She underwent  implantation of the pacemaker on September 4th and discharging September  6th, post procedure day number 2.  She has had no hematoma.  The device  has been interrogated and all values are within normal limits.  Chest x-  ray shows the leads are in appropriate position.  She has been kept an  extra day due to minor debilitation, and has responded well  postoperatively.  She had discharges with the admonition to keep her  incision dry for the next 7 days, to sponge-bathe until Thursday,  September 11th.   DISCHARGE  MEDICATIONS:  1. Furosemide 40 mg Gram's daily.  2. Potassium chloride 20 mEq daily.  3. Simvastatin 20 mg daily.  4. Avapro 300 mg daily.  5. Coumadin 2.5 mg daily.   She has followup with [facility] [place] [facility], Thursday, September 11th at 11:30 a.m., and also she will see  Dr. Ladona Ridgel Wednesday. December 10 at 3:00.      Maple Mirza, PA     GM/MEDQ  D:  11/19/2006  T:  11/20/2006  Job:  [id number]   cc:   Doylene Canning. Ladona Ridgel, MD  Gerrit Friends. Dietrich Pates, MD, [facility]

## 2010-07-29 NOTE — Group Therapy Note (Signed)
NAMEWAKISHA, ALBERTS NO.:  [id number]   MEDICAL RECORD NO.:  [id number]          PATIENT TYPE:  INP   LOCATION:  A226                          FACILITY:  [facility]   PHYSICIAN:  Edward L. Juanetta Gosling, M.D.DATE OF BIRTH:  16-Jul-1915   DATE OF PROCEDURE:  DATE OF DISCHARGE:                                 PROGRESS NOTE   SUBJECTIVE:  Ms. Prezioso says she is feeling better.  She is was able to  sleep lying pretty flap last night, so it appears to be improved.  She  has no new complaints.  She says that she has had some leg cramps and  she also had some swelling around her eyes prior to admission.   OBJECTIVE:  Her exam now shows temperature is 97.1, pulse 83,  respirations 18, blood pressure 118/59, O2 sats 93% on room air.  Her  chest is clear.  Her electrolytes this morning are normal.  BUN is 20,  creatinine 1.26, potassium is 3.6.  BNP has actually gone up a bit at  318.  Cardiac enzymes thus far negative for infarction.  She has had her  echocardiogram, but I do not have reports yet.   ASSESSMENT:  She has atrial fibrillation with what appears to be  congestive heart failure.  Dr. Dietrich Pates has seen her and his help is  appreciated.  I am not sure at this point how bad her congestive heart  failure is, but she seems to be symptomatically better at least.      Ramon Dredge L. Juanetta Gosling, M.D.  Electronically Signed     ELH/MEDQ  D:  10/05/2006  T:  10/05/2006  Job:  [id number]

## 2010-07-29 NOTE — Discharge Summary (Signed)
NAMEKENOSHA, [name] NO.:  [id number]   MEDICAL RECORD NO.:  [id number]          PATIENT TYPE:  INP   LOCATION:  A219                          FACILITY:  [facility]   PHYSICIAN:  Edward L. Juanetta Gosling, M.D.DATE OF BIRTH:  June 03, 1915   DATE OF ADMISSION:  11/02/2006  DATE OF DISCHARGE:  08/23/2008LH                               DISCHARGE SUMMARY   FINAL DISCHARGE DIAGNOSES:  1. Atrial fibrillation with slow ventricular response.  2. Mild congestive heart failure.  3. Hypertension.  4. Osteoarthritis.  5. Chronic Coumadin therapy.  6. Hyperlipidemia.  7. Gastroesophageal reflux disease.  8. Near syncope related to atrial fibrillation with slow ventricular      response.  9. Hyperkalemia.  10.Anemia.  11.Mild chronic renal failure.   HISTORY:  Ms. Sirmon is an 77 year old who presented to the emergency room  after an episode of near syncope.  She has actually been seen in my  office previously.  She had a near-syncopal episode, came to the  emergency room, and was found to have a fairly slow atrial fibrillation.  She had an episode of atrial fibrillation about a month ago that his  rapid.  She was placed on verapamil, had some slow heart rate associated  with the use of beta blocker.  She then developed a near syncopal  episode; and had what appeared to be a slow heart rate on admission.   Her physical exam showed a well-developed, well-nourished female who did  not appear to be in any acute distress.  Her blood pressure 128/54,  respirations 18, pulse was 62.  She did have some orthostatic changes  but not a great deal.  Her nose and throat were clear.  Her heart was  irregular without gallop.  Her chest was clear.  Abdomen was soft.   Her hemoglobin level which has been chronically low was down to 9.1 on  admission, and went down to about 8.5.  Her BUN 50, creatinine 1.56.   HOSPITAL COURSE:  She did not have any rapid heartbeat.  She did have  some slow heart  beats.  She was taken off of her previous verapamil and  she improved.  She had an elevated potassium that had to be treated.  She had cardiology consultation; and it was felt that she should be  monitored closely.  She is better by the time of discharge.  She is  discharged home.   DISCHARGE MEDICATIONS:  1. Lasix 20 mg daily.  2. Potassium chloride 20 mEq daily.  3. She is Off the verapamil.  4. She is on 300 mg of Avapro daily which is an increase from 150.  5. Coumadin 2.5 mg daily to be monitored in the Coumadin clinic.  6. Zocor 20 mg daily.  7. Os-Cal with D daily.  8. Ibuprofen 400 mg four times a day as needed.   FOLLOWUP:  She will be followed in the [facility] cardiology clinic as well  as in the Coumadin Clinic.      Edward L. Juanetta Gosling, M.D.  Electronically Signed     ELH/MEDQ  D:  11/06/2006  T:  11/06/2006  Job:  [id number]

## 2010-07-29 NOTE — Assessment & Plan Note (Signed)
NAMEMarland [name]  KADESIA, ROBEL                 CHART#:  [id number]   DATE:  03/03/2007                       DOB:  1916-02-07   REQUESTING PHYSICIAN:  Oneal Deputy. Juanetta Gosling, M.D.   CHIEF COMPLAINT:  Urgent work-in, abdominal pain, bloody stools.   HPI:  [name] is an 77 year old female, who was previously a patient of  Dr. Karilyn Cota.  She has a history of IBS, which alternates between  constipation and diarrhea.  She does have a positive family history of  colon cancer.  She had a colonoscopy on December 17, 2005, by Dr. Karilyn Cota.  She was found to have pancolonic diverticulosis, mostly in the sigmoid  colon; a small benign polyp was ablated via cold biopsy from the  ascending colon and distal proctitis, which was felt to be related to  the prep.  She tells me over the last two weeks she has had bloody  stools.  More recently the bleeding has become darker.  She has been on  Coumadin since September, when she had a pacemaker placed by [employer] in  [place].  She is followed by Dr. Dietrich Pates.  She was recently told her  INR was in the 4 range and so she has had multiple adjustments in her  Coumadin dose recently.  She awakened the wee hours of Monday (three  days ago) with severe abdominal pain, mostly to her lower quadrants.  Left was greater than right.  She has had several bouts of diarrhea,  which has slacked off somewhat.  Yesterday she had two to three loose,  bloody stools.  The day before was four to six.  The pain was 10/10 at  worst.  Now her pain is 3/10.  She had been taking 400 mg of ibuprofen  b.i.d., as well as 1 g of Tylenol b.i.d.  She has noted frequent  epistaxis, as well.  She has been seen by an otolaryngologist for this.  She tells me about one month ago she felt that she had diverticulitis  and she had similar symptoms, but she did not seek treatment at that  time.   PAST MEDICAL AND SURGICAL HISTORY:  She has history of IBS with  alternating constipation and diarrhea.  She had a  colonoscopy in 2007,  as described in the HPI, by Dr. Karilyn Cota.  She has history of  hypertension, hyperlipidemia.  She had a pacemaker placed, November 18, 2006, by Dr. Ladona Ridgel.  She is followed by Dr. Dietrich Pates on chronic  Coumadin.  She is status post partial hysterectomy in 1972.  She had her  esophagus dilated back in 1992 and 1994.  She had an EGD by Dr. Karilyn Cota  on Aug 05, 2005.  She had swollen, erythematous gastric folds with  patchy erythema, felt to be hypertrophic gastritis.  There was no  evidence of H. pylori or dysplasia.  She has chronic GERD.  She had  biliary pancreatitis in April of 2003 and had a sphincterotomy for  papillary stenosis.  She had neck surgery for a disc problem in 1976 and  back surgery in 1989 and 1994.   CURRENT MEDICATIONS:  1. Simvastatin 20 mg daily.  2. Lasix 10 mg daily.  3. Multivitamin daily.  4. Calcium and vitamin D daily.  5. Acetaminophen 1 g b.i.d.  6. Ibuprofen 400 mg b.i.d.  7. Zegerid 40 mg daily.  8. Avapro 300 mg daily.  9. Klor-Con 20 mEq daily.  10.Verapamil 240 mg daily.  11.Coumadin 2.5 mg daily.  12.Metoprolol 50 mg daily.  13.Vitamin B12 once daily.   ALLERGIES:  No known drug allergies.   FAMILY HISTORY:  Positive for mother deceased at age 58, who had history  of colon cancer.  Father deceased at 69 with lung cancer.  She lost one  sister with leukemia.  She has two healthy siblings.  She lost one son  due to Hodgkin's lymphoma.   SOCIAL HISTORY:  She is a widow.  She has one healthy son, who lives  nearby.  She is retired.  She denies tobacco, alcohol or drug use.   REVIEW OF SYSTEMS:  See HPI.  GU:  She denies any dysuria, hematuria.  Has noted some decreased urinary frequency.  HEENT:  See HPI.  History  of frequent epistaxis and dry mucous membranes.  She has been followed  by ENT.   PHYSICAL EXAM:  VITAL SIGNS:  Weight 155 pounds, height 61 inches,  temperature 98 degrees, blood pressure 128/70, pulse 60.   GENERAL:  Ms. Griner is an elderly Caucasian female, who is alert and  oriented, pleasant and cooperative.  She at times becomes tearful during  the exam.  HEENT:  Sclerae clear, anicteric.  Conjunctivae pink.  Oropharynx pink  and moist without any lesions.  NECK:  Supple without masses or thyromegaly.  CHEST:  Heart rate with a 3/6 murmur noted.  She has a pacemaker to her  left upper anterior chest; the scar is well-healed.  Lungs clear to  auscultation bilaterally.  ABDOMEN:  Positive bowel sounds times four.  No bruits auscultated.  Soft.  She does have significant tenderness in the left lower quadrant  on deep palpation.  There is no rebound tenderness or guarding, no  hepatosplenomegaly or mass, although exam was limited, given patient's  body habitus.  RECTAL:  She does have both internal and external bleeding hemorrhoids.  Her anal canal is quite friable.  She does have a significant amount of  formed stool in the rectal vault, making rectal exam limited.  She is  quite tender on exam, as well.  EXTREMITIES:  Without clubbing.  She does have trace pretibial edema  bilaterally.   LABORATORY STUDIES:  Have been obtained through Dr. Juanetta Gosling' office on  Monday, which would have been December 15.  However, we do not have  these results; have requested them.   IMPRESSION:  [name] is an 77 year old female with a two-week history  of intermittent hematochezia, along with a three-day history of severe  lower abdominal pain, mostly left lower quadrant.  On exam, she  definitely has bleeding hemorrhoids and is quite tender.  It is possible  that she could have proctitis or even colitis, given findings.  We  should also rule out diverticulitis and/or diverticular bleeding, given  her symptoms.  Ischemia should remain in the differential, as should  NSAID-induced colitis/GI bleeding.  She is also on a significant amount  of ibuprofen, along with Coumadin, placing her at great risk for  GI  bleeding, and we have discussed this in detail today.   PLAN:  1. We will request recent labs from Dr. Juanetta Gosling' office.  2. I have urged her to discontinue the ibuprofen.  She can continue      Extra Strength Tylenol p.r.n. for pain.  I have offered her  something stronger.  She has declined.  She has been offered      admission to the hospital to help control pain, get some fluids and      complete her workup.  However, she is desiring to stay at home so      long as she is stable.  I have instructed her to go immediately to      the emergency room if her pain becomes severe, she begins      hemorrhaging or develops fever, nausea or vomiting.  3. We will obtain a stat CBC, LFTs, INR, sed rate and met 7.  4. If creatinine is okay, we will proceed with stat CT of the abdomen      and pelvis with IV and oral contrast.  5. Anusol-HC suppositories one per rectum b.i.d.  6. Further recommendations to follow.   We would like to thank Dr. Juanetta Gosling for allowing [place] to participate in the  care of Ms. Connett.       Lorenza Burton, N.P.  Electronically Signed     R. Roetta Sessions, M.D.  Electronically Signed    KJ/MEDQ  D:  03/03/2007  T:  03/04/2007  Job:  [id number]   cc:   Ramon Dredge L. Juanetta Gosling, M.D.

## 2010-07-29 NOTE — Letter (Signed)
May 21, 2008    Edward L. Juanetta Gosling, MD  [address]  [place], [place] [address]   RE:  [name], [name]  MRN:  [id number]  /  DOB:  09/28/1915   Dear [name],   [name] returns to the office for continued assessment and treatment of  sick sinus syndrome, mild aortic stenosis, mild-to-moderate coronary  disease, and a history of congestive heart failure with preserved left  ventricular systolic function.  She has been doing beautifully with  essentially no cardiopulmonary symptoms.  She does have a rare episode  of chest discomfort, but has not used nitroglycerin given to her weeks  ago.  She leads a sedentary lifestyle, but does not have any significant  dyspnea.  She has mild chronic bleeding from her nose that has been  evaluated by an ENT physician.  She used Vaseline topically for 2 weeks  with improvement in the problem, but it has now resumed.  She has minor  bleeding on most days.  She has not had any actual blood flow.  She  usually blows her nose frequently and also pick scabs that develop in  her nose.  She uses no instruments or foreign bodies.  She has been  evaluated by an ENT physician and told that she had no significant  pathology.   CURRENT MEDICATIONS:  1. Metoprolol 25 mg b.i.d.  2. Verapamil 240 mg daily.  3. Avapro 300 mg daily.  4. Warfarin as directed with stable and therapeutic anticoagulation.  5. Calcium and vitamin D.  6. Simvastatin 20 mg daily.  7. Omeprazole 40 mg daily.  8. Sodium bicarbonate 325 mg daily.  9. Chlorthalidone 12.5 mg daily.  10.Aspirin 81 mg daily.   PHYSICAL EXAMINATION:  GENERAL:  Very pleasant woman in no acute  distress.  She is remarkably sharp despite being 77 years old.  VITAL SIGNS:  Weight is 168 pounds, stable.  Blood pressure 130/60,  heart rate 70 and regular, respirations 12 and unlabored.  HEENT:  Right nares is clear; there is a denuded area on the posterior  nasopharynx on the left without active bleeding.  LUNGS:   Mild-to-moderate kyphosis; clear lung fields.  CARDIAC:  Normal first heart sounds; preserved aortic component in the  second heart sound; grade 2/6 buzzing systolic ejection murmur at the  cardiac base radiating to the carotids.  ABDOMEN:  Soft and nontender; no organomegaly.  EXTREMITIES:  No edema.   IMPRESSION:  [name] is doing well from a cardiac standpoint.  My  recollection is that she was having continuing atrial fibrillation when  her pacemaker was assessed in-hospital a few months ago.  She was  scheduled to see the electrophysiologist next month at which time this  will be reassessed; however, I doubt that warfarin can be discontinued.   Her principal concern is regarding her epistaxis.  There is clearly no  significant blood loss.  She is concerned by both the discomfort and  hassle of the bleeding, as well as an undiagnosed intranasal process.  She is not particularly eager to be reevaluated by ENT.  We will stop  her aspirin and have her use a humidifier.  I pointed out the importance  of not traumatizing her mucosa - she will try not to pick scabs or blow  her nose.  If symptoms continue, she will see her ENT physician.  I will  plan to reassess this nice woman in 6 months.  We will recheck a  chemistry profile and  lipid profile and obtain stool for hemoccult  testing.  Anemia was last assessed late in January.  Hemoglobin was 10,  but was somewhat improved from prior values.    Sincerely,      Gerrit Friends. Dietrich Pates, MD, [facility]  Electronically Signed    RMR/MedQ  DD: 05/21/2008  DT: 05/22/2008  Job #: [id number]

## 2010-07-29 NOTE — Assessment & Plan Note (Signed)
[facility]                         ELECTROPHYSIOLOGY OFFICE NOTE   ROZANNA, CORMANY                       MRN:          [id number]  DATE:02/23/2007                            DOB:          September 13, 1915    Ms. Nass returns today for followup.  She is a very pleasant elderly  woman with a history of symptomatic bradycardia, with heart rates in the  20s, who underwent permanent pacemaker insertion back approximately  three months ago.  Since then, she has done well.  She has no longer had  any near-syncopal episodes which she had previously had frequently prior  to her pacemaker implant.  She had no specific complaints today.   PHYSICAL EXAMINATION:  GENERAL:  She is a pleasant elderly woman in no  distress.  VITAL SIGNS:  Blood pressure today was 156/56, the pulse was 78 and  regular, respirations were 18, weight was 155 pounds.  NECK:  Revealed no jugular venous distention.  LUNGS:  Clear bilaterally to auscultation.  No wheezes, rales, or  rhonchi were present.  EXTREMITIES:  Demonstrated no edema.   MEDICATIONS:  1. Simvastatin 20 a day.  2. Multiple vitamins.  3. Potassium 20 mEq daily.  4. Warfarin 5 mg as directed.  5. Furosemide 10 mg daily.  6. Verapamil 240 mg daily.  7. Avapro 300 mg daily.   Interrogation of her pacemaker demonstrates a Medtronic Reiffton, with P  and R waves of 2 and 22, respectively.  The impedance was 377 in the  atrium, 606 in the ventricle, threshold 0.5 at 0.4 in both the atrial  and the ventricle.  The battery voltage was 2.77 volts.  Today, we  turned her outputs down to 1.5 at 0.4 in the atrium and 2 at 0.4 in the  right ventricle.   IMPRESSION:  1. Symptomatic bradycardia.  2. Paroxysmal atrial fibrillation.  3. Status post pacemaker insertion.  4. Coumadin therapy.   DISCUSSION:  Overall, Ms. Tolley is stable.  We will plan to see her back  in the office for pacemaker followup in September  2009.     Doylene Canning. Ladona Ridgel, MD  Electronically Signed    GWT/MedQ  DD: 02/23/2007  DT: 02/24/2007  Job #: [id number]   cc:   Ramon Dredge L. Juanetta Gosling, M.D.

## 2010-07-29 NOTE — Consult Note (Signed)
[name], [name] NO.:  [id number]   MEDICAL RECORD NO.:  [id number]          PATIENT TYPE:  INP   LOCATION:  A219                          FACILITY:  [facility]   PHYSICIAN:  Noralyn Pick. Eden Emms, MD, FACCDATE OF BIRTH:  1915/07/08   DATE OF CONSULTATION:  11/02/2006  DATE OF DISCHARGE:                                 CONSULTATION   Ms.  [name] is a 77 year old patient of Dr. Dietrich Pates.  We are asked to  evaluate her for presyncope and atrial fibrillation by Dr. Juanetta Gosling.   The patient was just discharged from the hospital on October 06, 2006.  At  that time, she had new onset atrial fibrillation with rapid ventricular  response.  There was evidence for sick sinus syndrome related to beta-  blockers as well, but the patient was anticoagulated with Coumadin and  has had follow-up with Dr. Dietrich Pates.  She last saw him on October 14, 2006.  At the time, he thought that she was doing well with anticoagulation and  rate control.  The patient has a chronic history of dizziness and  lightheaded type feeling.  She was noted to be a bit postural in Dr.  Marvel Plan office before when she was hospitalized in July, there was a  question of some diastolic heart failure with BNPs in the 200-300 range  and she was discharged on 40 of Lasix.  This dose was decreased by Dr.  Dietrich Pates to 20 a day.   The patient has no documented history of coronary artery disease.   The patient, this past Friday, was running errands and required an EMS  call while at [facility].  She felt dizzy.  There was no associated chest  pain, no palpitations.  The episode lasted for about an hour.  She was  not allowed to drive home.  Subsequently, last night, about 30-40  minutes after taking her medications, she felt an impending feeling of  death.  She was very emotional about the description.  She felt  lightheaded.  She had gone to the bathroom and got up and did not think  she could make it back to the bed.  When I  pressed her about why, she  just said that she felt lightheaded.  This feeling of impending doom was  hard to characterize.  There was no diaphoresis, no palpitations, no  chest pain, she does not feel like her weight has gone up recently and  there has been no PND or orthopnea.   The patient was subsequently seen in the emergency room.  Her  hemodynamics appeared stable.  She was in atrial fibrillation at a rate  of 58 with chronic left bundle branch block.  There were no prolonged  pauses seen.  The patient was subsequently admitted for further  evaluation.   REVIEW OF SYSTEMS:  Otherwise unremarkable.   PAST MEDICAL HISTORY:  Her past medical history is extensive.  1. She most recently has had atrial fibrillation with question of      diastolic heart failure.  2. There has been sick sinus syndrome related to beta-blockers  in the      past.  3. Hypertension.  4. Hyperlipidemia.  5. History of reflux.  6. History of irritable bowel syndrome.  7. History of diverticulosis and colon polyps.  8. Previous esophageal dilatations.  9. Previous ERCP with pancreatitis.  10.She is status post hysterectomy.  11.Chronic back pain.  12.Question of asthma.  13.Anemia of chronic disease.  14.Rosacea.   MEDICATIONS:  Medications were suppose to include  1. Coumadin as directed by our clinic.  2. Lasix, dose had been recently decreased to 20 a day.  3. Simvastatin 20 at bedtime.  4. Amitriptyline had been stopped.  5. Ibuprofen up to four times a day.  6. Tylenol.  7. Doxycycline for rosacea, which I believe she takes chronically.  8. Colace 100 a day.  9. Multivitamins.  10.Her Verapamil was changed to 120 a day long-acting.  11.Avapro 300 a day.  12.An aspirin a day.   ALLERGIES:  She denies any allergies.   SOCIAL HISTORY:  She lives by herself.  She has a son in the area.  She  is widowed for quite a few years.  Up until Friday, she was continuing  to drive a car and she does  not drink or smoke.  She was very emotional  about this episode.   FAMILY HISTORY:  Noncontributory.   PHYSICAL EXAMINATION:  VITAL SIGNS:  Her blood pressure is currently  128/54, did not check postural signs.  Respirations are 18.  She is in  atrial fibrillation at a rate of 62.  She was afebrile, temperature was  97.1, sats 96% on room air.  GENERAL APPEARANCE:  Currently remarkable for a somewhat teary-eyed,  elderly white female who is depressed.  HEENT:  Normal.  Carotids normal without bruit.  There is no  lymphadenopathy, no thyromegaly, no JVP elevation.  LUNGS:  Clear, good diaphragmatic motion, no wheezing.  CARDIOVASCULAR:  There is an S1 and an S2 with a systolic ejection  murmur.  PMI is normal.  ABDOMEN:  Protuberant.  She is status post hysterectomy.  Bowel sounds  are positive.  No AAA.  No hepatosplenomegaly, no hepatojugular reflux.  No bruits.  VASCULAR:  Femorals are +3 bilaterally without bruit.  Distal pulses are  intact with no edema.  NEUROLOGIC:  Nonfocal.  There is no muscular weakness.  SKIN:  Warm and dry.  Her rosacea appears stable.   EKG shows atrial fibrillation with a left bundle branch block.  There  were no long pauses.   LABORATORY DATA:  Remarkable for an INR of 2.5.  She is ruled out for  myocardial infarction.  BUN is elevated at 50, creatinine 1.56.  White  count was elevated at 14.7, hematocrit was down to 26.9.   Chest x-ray showed cardiomegaly with mild revascularization but no overt  heart failure.   IMPRESSION:  1. Presyncope and pending feeling of doom.  It is not clear to me that      this is related to her heart rhythm.  She clearly has had some      chronic dizziness in the past.  We have not documented any long      pauses.  With her atrial fibrillation, I think she needs to be      monitored in the hospital for at least two or three days to see how      she does and if we can document this.  It is not clear to me that a  pacemaker would solve her problem at this time, but clearly she may      be in need of one.  For the time being, we will continue her      calcium channel blocker.  2. Dizziness with postural symptoms in Dr. Marvel Plan office before.      Her symptoms this time, were clearly related to taking all of her      medicines as her impending feeling of doom happened 30-40 minutes      after this.  I think I would tend to stop her ARB and see how her      blood pressure does.  I would also further decrease her Lasix to 10      mg a day. She has normal systolic function.  Her BNPs were only in      the 200-300 range and she is currently not volume overloaded.  Her      BUN and creatinine are elevated, indicating some over-diuresis      which may be contributing to her postural symptoms.  3. Patient is on Coumadin for her atrial fibrillation.  I think this      is appropriate, however, she has a history of polyps and      diverticulosis as well as some sort of esophageal problem.  She      carries the diagnosis of anemia of chronic disease and her      hematocrit is quite low at 26.9.  I think this is also contributing      to some of her postural signs and possible dizziness.  Her stool      should be guaiaced and it may be worth while to follow up with an      EGD and colonoscopy.  4. In regards to the patient's atrial fibrillation, I do not think she      should be cardioverted.  I think that with normal left ventricular      function, given her current age, rate control and anticoagulation      is appropriate.  The main decision to be made here is whether or      not she needs a pacemaker and not whether or not she would benefit      from cardioversion.  5. Systolic ejection murmur.  We will check a 2-D echocardiogram  on      the patient.  Her last echocardiogram  is not in E-chart but      reportedly had some aortic sclerosis and normal left ventricular      function.   We will be happy to  follow the patient along here in the hospital and  make recommendations regarding the need for a pacemaker if her telemetry  shows the need.      Noralyn Pick. Eden Emms, MD, [facility]  Electronically Signed     PCN/MEDQ  D:  11/02/2006  T:  11/02/2006  Job:  [id number]

## 2010-07-29 NOTE — Group Therapy Note (Signed)
NAMEALEERA, GILCREASE NO.:  [id number]   MEDICAL RECORD NO.:  [id number]          PATIENT TYPE:  INP   LOCATION:  A310                          FACILITY:  [facility]   PHYSICIAN:  Edward L. Juanetta Gosling, M.D.DATE OF BIRTH:  [birth year]   DATE OF PROCEDURE:  DATE OF DISCHARGE:                                 PROGRESS NOTE   PROBLEM:  Chest discomfort.   SUBJECTIVE:  Ms. Brahmbhatt is still having some problem with chest  discomfort.  She has had nausea and diarrhea as well.  She has no other  new complaints.   PHYSICAL EXAMINATION:  VITAL SIGNS:  Temperature 97.7, pulse 64,  respirations 18, blood pressure 97/38, and O2 sats 99% on 2 L.  CHEST:  Actually pretty clear.  HEART:  Regular.  ABDOMEN:  Soft.   ASSESSMENT:  She has got chest discomfort.  It is not clear what this is  from.  She has abdominal discomfort, which is better.  She has irritable  bowel syndrome and she has had a pacemaker.  We are trying to figure out  whether she will need to have further cardiac evaluation or not, and Dr.  Ival Bible help is appreciated.      Edward L. Juanetta Gosling, M.D.  Electronically Signed     ELH/MEDQ  D:  12/15/2007  T:  12/16/2007  Job:  [id number]

## 2010-07-29 NOTE — Letter (Signed)
October 14, 2006     RE:  [name], [name]  MRN:  [id number]  /  DOB:  04-20-1915    Sincerely,      Gerrit Friends. Dietrich Pates, MD, [facility]  Electronically Signed    RMR/MedQ  DD: 10/14/2006  DT: 10/14/2006  Job #: [id number]

## 2010-07-29 NOTE — Cardiovascular Report (Signed)
[name], [name] NO.:  [id number]   MEDICAL RECORD NO.:  [id number]          PATIENT TYPE:  INP   LOCATION:  3707                         FACILITY:  [facility]   PHYSICIAN:  Bevelyn Buckles. Bensimhon, MDDATE OF BIRTH:  1915/09/10   DATE OF PROCEDURE:  12/21/2007  DATE OF DISCHARGE:                            CARDIAC CATHETERIZATION   PRIMARY CARE PHYSICIAN:  Edward L. Juanetta Gosling, MD   CARDIOLOGIST:  Gerrit Friends. Dietrich Pates, MD, [facility]   PATIENT IDENTIFICATION:  [name] is a very pleasant 77 year old woman  with a history of paroxysmal atrial fibrillation and tachy-brady  syndrome status post dual-chamber pacemaker.  She also has a history of  diastolic heart failure, hypertension, hyperlipidemia, and recently  diagnosed chronic lymphocytic leukemia.  She has been having some chest  pain.  Myoview showed an EF of 41% with no evidence of reversible  ischemia; however, she has continued to have chest pain under her left  breast.  Cardiac markers have been negative.  She is referred for  cardiac catheterization.   PROCEDURES PERFORMED:  1. Selective coronary angiography.  2. Left heart cath.   Left ventriculogram was not performed in an effort to limit the contrast  exposure.   DESCRIPTION OF PROCEDURE:  The risks and indications of catheterization  were explained.  Consent was signed and placed on the chart.  A 5-French  arterial sheath was placed in the right femoral artery using a modified  Seldinger technique, standard catheters including a JL-4, JR-4, and  angled pigtail.  All catheter exchanges were made over the wire.  There  were no apparent complications.  We did have some difficulty in crossing  the aortic valve, but there were able to use this after a few attempts  with the pigtail catheter and a J-wire.   Central aortic pressure was 151/63 with a mean of 93.  LV pressure  163/22 with an EDP of 28.  There was no aortic stenosis.   Total contrast used was 70  mL.   Left main had mild calcification.  There was no significant stenosis.   LAD was a long vessel coursing wrapping the apex.  It gave off a large  first diagonal.  The LAD was a bit hard to fill completely and visualize  due to the size and overlapping nature of the circumflex.  There was a  40-50% tubular lesion in the proximal LAD followed by about 50-60%  lesion in the mid LAD after the first diagonal.   Left circumflex was a large dominant vessel.  It gave off a moderate  size ramus, a small OM1, a large OM2, a moderate OM3, several  posterolaterals, and a PDA with angiographically normal.   Right coronary artery was a very small and nondominant vessel with an  80% lesion in the midsection.   ASSESSMENT:  1. Nonobstructive coronary artery disease.  2. Elevated left ventricular end-diastolic pressure consistent with      the patient's known history of diastolic heart failure.   PLAN/DISCUSSION:  Given her coronary anatomy, I do not think her chest  pain is cardiac in  nature.  We will continue to treat her with medical  therapy.  We will watch her renal function closely.      Bevelyn Buckles. Bensimhon, MD  Electronically Signed     DRB/MEDQ  D:  12/21/2007  T:  12/22/2007  Job:  [id number]

## 2010-07-29 NOTE — Assessment & Plan Note (Signed)
NAMEMarland Kitchen  [name], [name]                 CHART#:  [id number]   DATE:  03/07/2007                       DOB:  Jul 12, 1915   CHIEF COMPLAINT:  Followup acute diverticulitis.   SUBJECTIVE:  [name] is an 77 year old female who was seen by me  March 03, 2007. She was having severe left lower quadrant pain. She  was sent for evaluation of anemia by Dr. Juanetta Gosling. She had been noticing  some dark loose stools as well. She had been having some epistaxis and  she was taking a significant amount of ibuprofen. She had lab results  which showed a white blood cell count of 19,000. She was started on  Cipro and Flagyl for presumed diverticulitis. The following day she had  a contrast CT of the abdomen and pelvis which showed acute  diverticulitis at the level of the rectosigmoid junction. Her last  colonoscopy was by Dr. Gerilyn Nestle on December 17, 2005. She was found to have  pan colonic diverticulosis, mostly in the sigmoid colon, small benign  polyp ablated from descending colon and distal proctitis thought to be  related to prep. She has a history of irritable bowel syndrome as well  as GERD and glaucoma. She has been on probiotics. She has responded  nicely to Cipro and Flagyl. She has noted some loose stools, but they  are decreasing in frequency. Yesterday, she had only two and the day  previously had been 8-10. She denies any heartburn or indigestion. Her  appetite is improving. She does have a history of atrial fibrillation  and is on Coumadin. INR from March 03, 2007 was 1.6. Hemoglobin was  9.2, hematocrit 28.1 and MCV 92. She had a sed rate of 50 and a BUN of  53 and creatinine of 1.32 and normal LFTs except an albumin of 3.4.   CURRENT MEDICATIONS:  See the list from March 07, 2007.   ALLERGIES:  No known drug allergies.   OBJECTIVE:  VITAL SIGNS: Weight 156 pounds, height 51 inches,  temperature 98.1, blood pressure 144/68, pulse 72.  GENERAL: She is a well-developed, well-nourished  elderly Caucasian  female in no acute distress.  HEENT: Sclerae are  clear and anicteric. Conjunctivae pink. Oropharynx  pink and moist without any lesions.  CHEST: HEART: Regular rate and rhythm with a 3/6 murmur noted.  ABDOMEN: Positive bowel sounds in all four quadrants. No bruits  auscultated. Soft and nontender, nondistended without palpable mass or  hepatosplenomegaly. No rebound, tenderness or guarding.  EXTREMITIES: Without clubbing or edema bilaterally.   ASSESSMENT:  [name] is an 77 year old female with acute diverticulitis  which is responding to Cipro and Flagyl. She also has anemia, which may  be multi-factorial. She has had some GI bleeding and dark stools  possibly diverticular in nature. She also had visible hemorrhoids on  examination last week. Cannot rule out upper GI etiology without further  workup. She did have an EGD by Dr. Gerilyn Nestle on Aug 05, 2005 which showed  swollen, erythematous gastric folds with patchy erythema which was  hypertrophic gastritis. She also has a mildly elevated creatinine. She  has underlying history of irritable bowel syndrome and gastroesophageal  reflux disease.   PLAN:  1. Will recheck CBC. She is to have an INR drawn tomorrow through      [facility].  2.  She is to finish the Cipro and Flagyl.  3. If H&H continues to drop, would go ahead and proceed with EGD,      otherwise will bring her back in six weeks to see Dr. Jena Gauss to be      sure her diverticulitis has resolved and at that point he can      determine whether she is going to need further evaluation in the      way of colonoscopy plus/minus EGD for her anemia.  4. She is to continue probiotics.       Lorenza Burton, N.P.  Electronically Signed     R. Roetta Sessions, M.D.  Electronically Signed    KJ/MEDQ  D:  03/07/2007  T:  03/07/2007  Job:  [id number]   cc:   Ramon Dredge L. Juanetta Gosling, M.D.

## 2010-07-29 NOTE — Letter (Signed)
December 06, 2006    Ramon Dredge L. Juanetta Gosling, M.D.  [address]  [place], [place] [address]   RE:  [name], [name]  MRN:  [id number]  /  DOB:  04/14/1915   Dear Ed:   [name] returns to the office following recent implantation of dual  chamber pacemaker.  She has noted dramatic improvement with no  additional episodes of near syncope.  She does continue to complain of  some light-headedness that is unpredictable.  She wondered if this might  be caused by her Verapamil, so she halved the dose to 90 mg daily.  Her  other medications include simvastatin 20 mg daily, KCl 20 mEq daily,  Warfarin as directed with generally stable and therapeutic  anticoagulation, but an INR of 1.4 today, calcium and Vitamin D, Vitamin  B12, furosemide 10 mg daily, and Avapro 300 mg daily.   PHYSICAL EXAMINATION:  On exam, pleasant woman in no acute distress.  The weight is 159, one pound less than one month ago.  Blood pressure  145/60, heart rate 90 and irregular, respirations 16.  NECK:  Prominent jugular venous pulse.  CARDIAC:  Irregular rhythm; normal first and second heart sounds; grade  2/6 basilar systolic ejection murmur.  LUNGS:  Kyphosis; otherwise clear.  THORAX:  Pacemaker in the left infraclavicular region; incision is well-  closed.  EXTREMITIES:  1+ edema.   Orthostatic vital signs were assessed.  Her blood pressure was 170/65  lying, and 160/70 sitting, and 170/70 standing.   IMPRESSION:  [name] is dramatically improved.  She continues to have  episodes of dizziness which apparently do not reflect significant  orthostatic hypotension, but may be due to atrial fibrillation with a  rapid ventricular response.  Her dose of Verapamil will be restored to  180 mg daily.  Metoprolol will be started at a dose of 50 mg daily in  the Extended Release form.  I will see this nice woman again in one  month.    Sincerely,      Gerrit Friends. Dietrich Pates, MD, [facility]  Electronically Signed    RMR/MedQ   DD: 12/06/2006  DT: 12/06/2006  Job #: [id number]

## 2010-07-29 NOTE — Consult Note (Signed)
[name], [name] NO.:  [id number]   MEDICAL RECORD NO.:  [id number]          PATIENT TYPE:  INP   LOCATION:  A310                          FACILITY:  [facility]   PHYSICIAN:  Jonelle Sidle, MD DATE OF BIRTH:  20-Jan-1916   DATE OF CONSULTATION:  DATE OF DISCHARGE:                                 CONSULTATION   REFERRING PHYSICIAN:  Dr. Oneal Deputy. Hawkins.   PRIMARY CARDIOLOGIST:  Dr.  Bing.   REASON FOR CONSULTATION:  Chest pain and shortness of breath.   HISTORY OF PRESENT ILLNESS:  [name] is a pleasant 77 year old woman  with a history of paroxysmal atrial fibrillation and tachycardia  bradycardia syndrome  status post Medtronic dual-chamber pacemaker  placement back in September 2008 on chronic Coumadin therapy, previously  documented diastolic heart failure, hypertension, hyperlipidemia, left  bundle branch block, and recently diagnosed chronic lymphocytic leukemia  (evaluation by Dr. Mariel Sleet).  She last saw Dr. Dietrich Pates for routine  follow-up back in July 2009, and overall was doing fairly well, although  mentioned an episode of chest pain and was further investigated with a  follow-up Myoview.  This study was done in late July, demonstrated in  the presence of a left bundle branch block and ventricular pacing, an  anteroseptal/anteroapical defect that was not clearly reversible.  This  is described as being potentially related to artifactual findings  associated with a paced rhythm and left bundle branch block pattern,  although not entirely so.  She was actually due to see Dr. Ladona Ridgel today  in the office for a device follow-up.   Ms. Stille is presently admitted to the hospital having presented with  chest pressure and shortness of breath that woke her up at night.  She  states that she chewed two baby aspirin and that her symptoms did not  entirely improve for at least an hour, prompting admission to the  hospital.  She feels better  this morning.  At this point, her cardiac  markers are reassuring with a troponin-I level of less than 0.05 and a  BNP level only mildly increased at 147.  Of note, her ejection fraction  by recent Myoview in July was 41%, representing a decrease compared to  previous echocardiographic finding from 2008.  Chest x-ray during this  admission also shows what is described as moderate congestive heart  failure with bilateral air space opacities at the bases.  Telemetry  review shows predominately paced rhythm with no rapid atrial arrhythmias  and heart rates consistently under 100.  Unable to locate a baseline  electrocardiogram at this time.  We have been asked to evaluate her  further.   ALLERGIES:  DETROL.   MEDICATIONS AT PRESENT:  1. Hygroton 12.5 mg p.o. daily.  2. Lasix 20 mg p.o. daily.  3. Lopressor 25 mg p.o. b.i.d.  4. Multivitamin 1 p.o. daily.  5. Benicar 40 mg p.o. daily.  6. Protonix 40 mg p.o. daily.  7. Potassium chloride 20 mg p.o. daily.  8. Zocor 20 mg p.o. nightly.  9. Calan 240 mg p.o. daily.  10.Coumadin  as directed by pharmacy.   PAST MEDICAL HISTORY:  As outlined above.  There is also a history of  mild renal insufficiency, chronic anemia, diverticular disease,  gastroenterology evaluation with endoscopy associated with snare  polypectomy, lesion ablation, sigmoidoscopy and biopsy done back in  August.  At that time, the patient had evidence of normal colorectal  mucosa with no active inflammatory changes.  No hyperplastic or  adenomatous changes were noted either.  An adenomatous duodenal polyp  was also noted without invasive malignant features or dysplasia.  She is  status post previous C-spine surgery, back surgery, cataract surgery,  bladder surgery, total abdominal hysterectomy, ERCP and sphincterotomy  due to biliary pancreatitis and previous cholecystectomy.  She also has  a diagnosis of irritable bowel syndrome.  Reflux and previous esophageal   dilatation is described.   SOCIAL HISTORY:  The patient is a widow and lives alone.  She still  remains functional and interactive with daily living and drives.  There  is no active tobacco or alcohol use history.   FAMILY HISTORY:  Reviewed and is not contributory at this point,  although does include death of the patient's father at age 62 with prior  history of myocardial infarction in his 84s.   REVIEW OF SYSTEMS:  As detailed above.  The patient reports normal  appetite.  No melena, hematochezia, hematuria, dysuria.  No sense of  palpitations.  No dizziness or syncope.  She does have mild chronic  lower extremity edema, although no orthopnea or PND.  Otherwise systems  are negative.   PHYSICAL EXAMINATION:  VITAL SIGNS:  Temperature is 97.8 degrees, heart  rate is in the 60s and regular, respirations 20, blood pressure 95/75.  Oxygen saturation 100% on 2 liters nasal cannula.  GENERAL:  This is a well-nourished elderly woman appearing younger than  stated age in no acute distress.  HEENT:  Oropharynx clear.  NECK:  Supple.  No elevated jugulovenous pressure.  No loud carotid  bruits, no thyromegaly.  LUNGS:  Exhibit diminished breath sounds,  rhonchorous changes towards the bases.  No wheezing or labored  breathing.  CARDIAC:  Reveals a regular rate and rhythm with a 3/6 systolic murmur  heard at the base, radiating up towards the carotids.  No S3 gallop or  pericardial rub is evident.  ABDOMEN:  Soft, nontender.  Normoactive bowel sounds.  EXTREMITIES:  Exhibit trace edema around the ankles predominantly.  Mild  venous stasis changes and spider veins.  Distal pulses are 1+.  SKIN:  Warm and dry.  MUSCULOSKELETAL:  Mild kyphosis noted.  NEUROPSYCHIATRIC:  The patient is alert and oriented x3.  Affect is  appropriate.   LABORATORY DATA:  WBCs 25.2, hemoglobin 9.4, hematocrit 28.4, platelets  233.  INR is 2.8.  Sodium 136, potassium 4.5, chloride 108, bicarb 25,  glucose  150, BUN 37, creatinine 1.25, CK 45, CK-MB 2.4, troponin-I 0.02.  BNP 147.   IMPRESSION:  1. Presentation with chest pressure and shortness of breath at rest.      Initial cardiac markers are reassuring.  Electrocardiogram is      pending at this point.  Ms. Puerta has no clearly documented history      of obstructive coronary artery disease.  She did undergo a recent      Myoview in late September, revealing an ejection fraction of 41%      with an anteroseptal/anteroapical defect in the setting of a left      bundle branch  block pattern and paced rhythm, overall equivocal.      She reports having similar chest discomfort last year.  2. Cardiac murmur consistent with aortic stenosis.  Available      information indicates a diagnosis of mild aortic stenosis based      on echocardiography last year.  3. Paroxysmal atrial fibrillation on chronic Coumadin therapy with      history of tachycardia bradycardia syndrome, status post placement      of a Medtronic dual-chamber pacemaker in September 2008.  Ms. Wissinger      was actually due for device follow-up this afternoon with Dr.      Ladona Ridgel.  4. Chronic left bundle branch block pattern.  5. Hyperlipidemia, on statin therapy.  6. History of recently diagnosed chronic lymphocytic leukemia based on      ongoing evaluation by Dr. Mariel Sleet.  7. Previously documented history of diastolic heart failure.   RECOMMENDATIONS:  At this point, we will arrange an echocardiogram to  follow up both aortic stenosis and cardiomyopathy based on reduced  ejection fraction on Myoview done in July.  An electrocardiogram will  also be obtained and we may actually be able to have the patient's  Medtronic dual-chamber pacemaker interrogated later this afternoon as  was the plan originally.  One would want to exclude any breakthrough  rapid atrial arrhythmias that may contribute to formation of a myopathy  over time.  Obviously with the recent chest pain and  shortness of  breath, would continue to cycle cardiac markers to investigate the  possibility of an acute coronary syndrome.  She has not undergone prior  invasive cardiac testing.  At the present time, she remains therapeutic  on Coumadin and this will not be held as yet.  It may well be that she  needs further medication adjustments, particularly if she does have  clearly reduced left ventricular function.  We may ultimately switch her  from Lopressor and verapamil to perhaps Coreg and digoxin depending on  how she does clinically.  We will cut her fluids back to [facility] and recheck  BMET in the morning.  Our service will follow with you.      Jonelle Sidle, MD  Electronically Signed     SGM/MEDQ  D:  12/14/2007  T:  12/14/2007  Job:  [id number]   cc:   Ramon Dredge L. Juanetta Gosling, M.D.  Fax: [phone]   Gerrit Friends. Dietrich Pates, MD, [facility]  [address]  [place], [place] [address]

## 2010-07-29 NOTE — Group Therapy Note (Signed)
NAMEKARYS, MECKLEY NO.:  [id number]   MEDICAL RECORD NO.:  [id number]          PATIENT TYPE:  INP   LOCATION:  A310                          FACILITY:  [facility]   PHYSICIAN:  Edward L. Juanetta Gosling, M.D.DATE OF BIRTH:  [birth year]   DATE OF PROCEDURE:  12/17/2007  DATE OF DISCHARGE:                                 PROGRESS NOTE   PROBLEMS:  1. Chest discomfort.  2. Atrial fibrillation.  3. Chronic anticoagulation.  4. Mild chronic renal failure.  5. Hyperkalemia.  6. Hypertension.  7. Chronic lymphocytic leukemia.   SUBJECTIVE:  Ms. Diesing says she is feeling a little better.  She says  that she may have minimal amount of the discomfort in her chest but  rates it at 1 or less.   PHYSICAL EXAMINATION:  VITAL SIGNS:  Temperature is 98.2, pulse is 71,  respirations 20, blood pressure 121/50, and O2 sats 100% on 2 L.  CHEST:  Clear.  HEART:  Regular.  ABDOMEN:  Soft.  She still has some diarrhea, which is a manifestation  of her irritable bowel syndrome.   LABORATORY DATA:  Today, her PT is 30.7 and INR 2.7.  Potassium still  5.4, BUN of 46, and creatinine 1.87.   She has had her Benicar held.  She has had her potassium held.  I am  going to give her Kayexalate today.  Continue with everything else and  follow.  I do not plan to change any meds today.  Plans are being made  for her to be transferred to [facility] for potential cardiac  catheterization next week, and she is going to need to bridge her  Lovenox and Coumadin.      Edward L. Juanetta Gosling, M.D.  Electronically Signed     ELH/MEDQ  D:  12/17/2007  T:  12/17/2007  Job:  [id number]

## 2010-07-29 NOTE — Group Therapy Note (Signed)
[name], [name] NO.:  [id number]   MEDICAL RECORD NO.:  [id number]          PATIENT TYPE:  INP   LOCATION:  A310                          FACILITY:  [facility]   PHYSICIAN:  Edward L. Juanetta Gosling, M.D.DATE OF BIRTH:  [birth year]   DATE OF PROCEDURE:  DATE OF DISCHARGE:                                 PROGRESS NOTE   [name] is overall about the same.  She continues to have some upper  abdominal and lower chest pain.  She is somewhat in short of breath,  which she states that is her chronic problem.   Temperature has been low and she states that she feels chilly.  She has  been using a blanket.  Temp measured as 95.2, pulse 73, respirations 16,  blood pressure 134/55, and O2 sats 98%.  She does not have any reason to  be hypothermic that I am aware of, so I am going to get her TSH level  and make sure that we are not missing something there.  She is being  prepared for transfer to [facility].  She does have renal  dysfunction.  Her renal function looks better today.  BUN is 37,  creatinine 1.47, and her potassium 4.8.  She is being cool bridged and  her INR was 2.2 today compared to 2.7 yesterday, so I think it is coming  down.   ASSESSMENT:  I think that she is better, but still with problems.   PLAN:  Continue with treatments, medications and followup.  She does  complain of some nosebleed and I want to get her a saline nasal spray at  bedside for p.r.n. use.      Edward L. Juanetta Gosling, M.D.  Electronically Signed     ELH/MEDQ  D:  12/18/2007  T:  12/18/2007  Job:  [id number]

## 2010-07-29 NOTE — H&P (Signed)
[name], [name]                ACCOUNT NO.:  [id number]   MEDICAL RECORD NO.:  [id number]          PATIENT TYPE:  AMB   LOCATION:  DAY                           FACILITY:  [facility]   PHYSICIAN:  R. Roetta Sessions, M.D. DATE OF BIRTH:  1915-06-22   DATE OF ADMISSION:  DATE OF DISCHARGE:  LH                              HISTORY & PHYSICAL   CHIEF COMPLAINT:  Follow-up duodenal villous adenoma.   [name] is a very, very pleasant 77 year old Caucasian female  whom I originally saw for melena in the setting of anticoagulation and  concomitant ibuprofen therapy.  She was seen on  March 23, 2007 at  which time I performed the EGD which revealed noncritical Schatzki's  ring, small hiatal hernia and an ulcer scar in the prepyloric antral  mucosa.  There was no ulcer crater.  However, in the duodenum there was  a flat verrucous appearing lesion which was biopsy proven to be a  villous adenoma.  There was no evidence of carcinoma.  She has done well  off ibuprofen.  Her INR has been managed in the 2 range at this time.  She previously was seen in December of 2008 with a bout of  diverticulitis.  CT findings were entirely consistent with  diverticulitis rather than any other process back in December of 2008  and its good to know.   Dr. Karilyn Cota had performed a colonoscopy back in October of 2007 with  extensive diverticulosis and a single polyp removed which turned out to  be benign.  H. pylori serologies since her EGD and back negative as  well.   PAST MEDICAL HISTORY:  Significant for:  1. Irritable bowel syndrome.  2. Hypertension.  3. Hyperlipidemia  4. Status post pacemaker placement.  5. Atrial fibrillation.  6. She is status post hysterectomy.  7. History of Schatzki's ring undergoing dilation back in the 1990s.  8. History of neck surgery for disk problems, as well as back surgery.   CURRENT MEDICATIONS:  1. Simvastatin 20 grams daily.  2. Furosemide 10 mg daily.  3.  Avapro 300 mg daily.  4. Verapamil 240 mg daily.  5. Warfarin directed by Gerrit Friends. Dietrich Pates, MD, [facility]  6. Metoprolol 50 mg daily.  7. Zegerid 40 mg daily.  8. Iron sulfate daily.  9. Acetaminophen p.r.n.  10.Calcium 500 mg vitamin D daily.  11.Probiotic daily.  12.Yogurt p.r.n.   ALLERGIES:  NO KNOWN DRUG ALLERGIES.   FAMILY HISTORY:  Mother died at age 57 with colon cancer.  Father died  at age 70 with lung disease.   SOCIAL HISTORY:  The patient is widowed.  She has one healthy son who  lives in town nearby.  No tobacco, alcohol, or illicit drugs.   REVIEW OF SYSTEMS:  No chest pain, dyspnea on exertion.  No fever,  chills.  No hematemesis or any more melena or hematochezia.[name]   PHYSICAL EXAMINATION:  Pleasant 76 year old lady, well-groomed, alert,  conversant, no acute distress.  Weight 160, up 5 pounds from December, 2008, temperature 98.3, BP  120/60, pulse 72.  SKIN:  Warm and dry.  There is no jaundice.  HEENT EXAM:  No scleral icterus.  Conjunctivae are light pink.  CHEST:  Lungs are clear to auscultation.  HEART:  Regular rate and rhythm with 2/6 systolic ejection murmur.  ABDOMEN: Nondistended, positive bowel sounds, soft, nontender.  No  appreciable masses or organomegaly.  EXTREMITIES: No edema.   IMPRESSION:  [name] is a pleasant 77 year old lady with a  recent upper gastrointestinal bleed secondary to ibuprofen concomitant  Coumadin therapy.  She is doing well at this time.  However, she was  found have a sprawling villous adenoma straddling the first and second  portions of her duodenum.  This really needs to be resected/ablated.  It  is in a bad location to try attempt at hot snare polypectomy.  I feel  the best approach would be to ablate this with a more superficial  modality such as the argon plasma coagulation,  although more than once  session may be needed.  I have discussed this approach with [name].  I  am planning to avoid a snare  polypectomy.  I certainly would like to  keep her out of the operating room if at all possible.  I feel the argon  plasma coagulation would be the way to go, although 2 to 3 sessions in  the context of esophagogastroduodenoscopy may be needed.  I spoke with  Dr. Dietrich Pates about 2 weeks ago in regards to the feasibility of stopping  her Coumadin and he had no problems with her stopping Coumadin 4 days  prior to the procedure.   It is good to know she had a colonoscopy just back in 2007.  Her  diverticulitis has resolved.  At this point I am going to have no plans  to offer her another colonoscopy at this time.  We will set her up for  an esophagogastroduodenoscopy with argon plasma coagulation ablation  later this month.  Further recommendations to follow.      Jonathon Bellows, M.D.  Electronically Signed     RMR/MEDQ  D:  04/19/2007  T:  04/20/2007  Job:  [id number]   cc:   Ramon Dredge L. Juanetta Gosling, M.D.  Fax: [phone]   Gerrit Friends. Dietrich Pates, MD, [facility]  [address]  [place], [place] [address]

## 2010-07-29 NOTE — Op Note (Signed)
[name], [name]                ACCOUNT NO.:  [id number]   MEDICAL RECORD NO.:  [id number]          PATIENT TYPE:  AMB   LOCATION:  DAY                           FACILITY:  [facility]   PHYSICIAN:  R. Roetta Sessions, M.D. DATE OF BIRTH:  1915-09-05   DATE OF PROCEDURE:  10/31/2007  DATE OF DISCHARGE:                               OPERATIVE REPORT   EGD with snare polypectomy, APC lesion ablation, followed by  sigmoidoscopy and biopsy.   INDICATIONS FOR PROCEDURE:  An 77 year old lady with history of  tubulovillous adenoma sprawling between D1 and D2, for which she has  undergone several sessions with polypectomy, previously revealing a  tubulovillous adenoma, there is no evidence of frank carcinoma.  She has  also had chronic watery nonbloody diarrhea.  She comes for followup EGD  with further intervention of duodenal lesion and sigmoidoscopy to rule  out microscopic colitis.  This approach has been discussed with the  patient at length previously, and today at bedside, the risks, benefits,  and alternatives have been reviewed.  She stopped taking Coumadin last  week.   PROCEDURE NOTE:  O2 saturation, blood pressure, pulse rate, and  respirations were monitored throughout the entire procedure.   CONSCIOUS SEDATION:  Versed 4 mg IV and Demerol 75 mg IV for both  procedures.  Cetacaine spray for topical pharyngeal anesthesia.   INSTRUMENT:  Pentax video chip system.   FINDINGS:  EGD examination of tubular esophagus revealed ample tiny  __________ lesions consistent with squamous papillomas; otherwise  unremarkable esophageal mucosa.  EG junction easily traversed __________  stomach:  Gastric cavity was emptied and insufflated well with air.  Thorough examination of gastric mucosa including retroflexion of the  proximal stomach esophagogastric junction demonstrated small hiatal  hernia.  Pylorus patent, easily traversed.  Examination of bulb and  second portion revealed duodenal  diverticulum, and again, a sprawling  adenomatous-appearing polyp in straddling D1-D2, which was very  difficult to see.  We are had a unstable positioning of the scope.  At  this level (scope kept falling back), this appeared to be more of a  sprawling flat sessile lesion, it had no further pedunculated component.  I did inject the periphery with total of 1 mL of normal saline in 0.5 cm  increments, this lifted down the polypoid tissue away from the mucosa  fairly well.  I did engage it with a snare, but again unstable  positioning and peristalsis made it really difficult to remove.  I did  do a partial cold snare polypectomy, retrieved the fragment for the  pathologist.  I elected not to make any further attempts at snare  polypectomy as the maneuver would be difficult and relatively unsafe.  I  subsequently obtained APC circular probe, and with the settings on the  cecum, I painted the residual area of carpet polyp completely without  complication.  There was minimal bleeding.  The patient tolerated the  procedure and was prepared for sigmoidoscopy.  Digital rectal exam  revealed no abnormalities.   ENDOSCOPIC FINDINGS:  It was basically an unprepped study.  Examination  of rectal mucosa demonstrated no abnormalities.  Rectal vault was small.  I tried to retroflex, but was unable to do so, but for some reason, I  was able to see the rectal mucosa well and it appeared normal.  I  advanced the scope into the distal sigmoid (to 30 cm), but was stopped  with a lumen occluded with underlying formed stool.  There were sigmoid  tics.  I did take 2 biopsies of the distal sigmoid mucosa.  The patient  tolerated both procedures and was reactive to endoscopy.   IMPRESSION:  EGD.  Small raised nodules in esophageal mucosa consistent  with squamous papillomas, not manipulated.  Otherwise, unremarkable  esophagus.  Small hiatal hernia, otherwise negative stomach.  Sprawling  sessile carpet-type  polyp, straddling D1 and D2 duodenum and  diverticulum.  Status post partial snare polypectomy and APC ablation of  this lesion:  Sigmoidoscopy findings:  Exam carried out to 30 cm, distal  sigmoid tics, formed stool in the lumen of colon status post biopsy of  the sigmoid mucosa, otherwise normal rectal mucosa.   RECOMMENDATIONS:  1. Resume Coumadin today.  2. Followup on path.  3. If path reveals more than an adenoma or tubulovillous adenoma, we      would plan to bring this lady back in a year and look at the site      once again.      Jonathon Bellows, M.D.  Electronically Signed     RMR/MEDQ  D:  10/31/2007  T:  11/01/2007  Job:  [id number]   cc:   Gerrit Friends. Dietrich Pates, MD, [facility]  [address]  [place], [place] [address]

## 2010-07-29 NOTE — H&P (Signed)
NAMETANIAYA, RUDDER NO.:  [id number]   MEDICAL RECORD NO.:  [id number]          PATIENT TYPE:  INP   LOCATION:  A226                          FACILITY:  [facility]   PHYSICIAN:  Edward L. Juanetta Gosling, M.D.DATE OF BIRTH:  07/12/1915   DATE OF ADMISSION:  10/04/2006  DATE OF DISCHARGE:  LH                              HISTORY & PHYSICAL   REASON FOR ADMISSION:  Shortness of breath.   HISTORY:  Ms. Halpin is an 77 year old who had an episode of shortness  breath that started on the day prior to admission; and was associated  with exertion.  She has had some shortness of breath; and actually was  evaluated in my office twice in the last month or so; and seemed to be  having more of an asthmatic sort of episode; and she does have a history  of asthma in the past.  She has not had any chest pain.  She has not had  any fever or chills.  She has not been coughing.  She just got more and  more short of breath, then she felt an odd sensation as if her heart was  beating like a drum against her ear.  She called 9-1-1; and was brought  to the emergency room.   PAST MEDICAL HISTORY:  Positive for hypertension and hyperlipidemia.  She has had problems with her abdomen.  Sh has had problems with chronic  back pain.  She has had asthma.  She has overactive bladder as well.  Her has had an episode of a biliary pancreatitis; and she has  gastroesophageal reflux disease.   MEDICATIONS:  1. Azor 5/40 one daily.  2. Simvastatin 20 mg daily,  3. Amitriptyline 25 mg daily.  4. Ibuprofen 200 mg four times a day.  5. Tylenol 1000 mg daily.  6. Doxycycline one daily.  7. Colace 100 mg daily.  8. A multiple vitamin daily.  9. Calcium and vitamin D 600 mg daily.   SOCIAL HISTORY:  She is a widow.  She lives at home.  She does not  smoke.  She does not drink any alcohol.   FAMILY HISTORY:  Positive for colon cancer in her mother who died at 100.   PHYSICAL EXAM:  GENERAL:  Shows a  well-developed, well-nourished female  who appears to be in no acute distress.  It should be noted that at  least twice during the night her heart rate has dropped into the 20s.  She says she feels weak.  VITAL SIGNS:  Her temperature is 100.4, pulse 74, respirations 20, blood  pressure 99/52, O2 saturation is 94% on room air.  Height 61 inches,  weight 75.1.  HEENT:  Her pupils are reactive.  Nose and throat are clear.  NECK:  Supple without masses.  I do not hear any bruits.  HEART:  Irregularly irregular.  The rate has been dropping.  CHEST:  Clear without wheezes, rales or rhonchi.  HEART:  Regular without murmur, or gallop.  ABDOMEN:  Soft.  No masses are felt.  EXTREMITIES:  Showed no clubbing, cyanosis or  edema.  CENTRAL NERVOUS SYSTEM:  Examination is grossly intact.   Her blood gas shows a pO2 of 85.2, pCO2 of 39.6, pH 7.408, white count  12,300, hemoglobin is 10.3.  Her D-dimer was 0.72 which is up some.  BUN  22, creatinine 1.29.  Cardiac enzymes negative.  Chest x-ray done in the  emergency room shows cardiomegaly and what appears to be some  interstitial edema suggesting congestive heart failure.   ASSESSMENT:  Then is that she has developed atrial fibrillation which is  a new problem.  She has had some episodes of low heart rate.  It is not  totally clear if her low heart rate is related to her atrial  fibrillation or if it is related to the fact that she got some Lopressor  to try to control her atrial fibrillation.  She has congestive heart  failure probably related to her atrial fibrillation.   PLAN:  To get an echocardiogram, get a cardiology consultation, and  slowly diurese her.  Her BNP was 275 which was up, but not terribly  high.      Edward L. Juanetta Gosling, M.D.  Electronically Signed     ELH/MEDQ  D:  10/04/2006  T:  10/04/2006  Job:  [id number]

## 2010-07-29 NOTE — Assessment & Plan Note (Signed)
[facility]                         ELECTROPHYSIOLOGY OFFICE NOTE   MYREL, RAPPLEYE                       MRN:          [id number]  DATE:11/25/2006                            DOB:          1915-10-18    Ms. Cada was seen today in the clinic in [place] on 25 November 2006, for a wound check of her newly implanted Medtronic model #SCDR01  Marvin.  Date of implant was November 18, 2006, for sick sinus syndrome.   On interrogation of her device today,  Her battery voltage is 2.78.  P waves measured 0.18 to 0.25 millivolts with an atrial lead impedance  of 397 ohms.  Atrial capture threshold was not done.  She is in an underlying atrial fibrillation 98.8% of the time and on  Coumadin therapy.  R waves measured 15.68 to 22.4 millivolts with a ventricular capture  threshold of 0.5 volts at 0.4 milliseconds and a ventricular lead  impedance of 609 ohms.  As I said before, there were 607 mode switch episodes totaling 98.8% of  the time.  No changes were made in her parameters.   Her Steri-Strips were removed.  Her wound is without redness or edema.  And, she will be seen again here in the [place] office in December by  Dr. Ladona Ridgel.      Altha Harm, LPN  Electronically Signed      Doylene Canning. Ladona Ridgel, MD  Electronically Signed   PO/MedQ  DD: 11/25/2006  DT: 11/25/2006  Job #: [id number]

## 2010-07-29 NOTE — Group Therapy Note (Signed)
NAMECORNELL, GABER NO.:  [id number]   MEDICAL RECORD NO.:  [id number]          PATIENT TYPE:  INP   LOCATION:  A219                          FACILITY:  [facility]   PHYSICIAN:  Edward L. Juanetta Gosling, M.D.DATE OF BIRTH:  [birth year]   DATE OF PROCEDURE:  DATE OF DISCHARGE:                                 PROGRESS NOTE   Ms. Hurtado has had another episode of feeling near-syncopal but it was not  as bad as the one that brought her to the hospital, but clearly still  another episode.  Otherwise she is doing okay.  She had been in sinus  rhythm on her EKG.  She is now back in atrial fib by monitor.   Her temperature is 97.5, pulse 71, respirations 20, blood pressure  142/62 was 113/56, O2 sats 100% on room air.  Her chest is clear.  Her heart irregular again.   Hemoglobin is 8.9 so that is actually better and we do not have a  definitive diagnosis at this point.  It is not totally clear to me what  has happened.  I was waiting about discharge and, considering her having  another episode, I think we are going to have to continue to wait.  Maybe she home tomorrow, but it depends on how she does.  I have occur  to remove around so we can get a better idea what is going on.      Edward L. Juanetta Gosling, M.D.  Electronically Signed     ELH/MEDQ  D:  11/05/2006  T:  11/06/2006  Job:  [id number]

## 2010-07-29 NOTE — Op Note (Signed)
NAMELESLE, FARON NO.:  [id number]   MEDICAL RECORD NO.:  [id number]          PATIENT TYPE:  INP   LOCATION:  6533                         FACILITY:  [facility]   PHYSICIAN:  Doylene Canning. Ladona Ridgel, MD    DATE OF BIRTH:  1915-06-18   DATE OF PROCEDURE:  11/18/2006  DATE OF DISCHARGE:                               OPERATIVE REPORT   PROCEDURE PERFORMED:  Implantation of a dual-chamber pacemaker.   INDICATIONS:  Symptomatic bradycardia with syncope.   INTRODUCTION:  The patient is an 77 year old with left bundle branch  block and symptomatic tachy-brady syndrome who underwent cardiac  monitoring which demonstrated severe bradycardia with heart rates in the  20s and 30s and she is admitted for pacemaker insertion.  She is admitted to hospital.  She is now referred for permanent  pacemaker.   PROCEDURE:  After informed consent was obtained, the patient taken to  diagnostic EP lab in fasting state.  After usual preparation and  draping, intravenous fentanyl and Midazolam was given for sedation.  30  mL lidocaine was infiltrated into the left infraclavicular region.  A 7  cm incision was carried out over this region.  Electrocautery utilized  to dissect down to the fascial plane.  10 mL of contrast demonstrated  patent left subclavian vein and this was subsequently punctured x2 and  the Medtronic model 5076 52-cm active fixation pacing lead, serial  number [id number] was advanced into the right ventricle and the  Medtronic model 4076 45-cm active fixation pacing lead serial number  [id number] was advanced into the right atrium.  Mapping was carried out  in the right ventricle and at the final site the R-waves were 17 mV and  the pacing impedance was 746 ohms, threshold 0.9 volts at 0.5  milliseconds and 10 volt pacing did not stimulate the diaphragm.  With  these satisfactory parameters, attention was then turned to placement  atrial lead which was placed in the  anterolateral portion the right  atrium where flutter waves were found to be 1.8 mV.  The pacing  impedance in the ALO mode with lead actively fixed was 390 ohms.  With  these satisfactory parameters, the leads were secured to subpectoralis  fascia with a figure-of-eight silk suture.  The sewing sleeve was also  secured with silk suture.  Electrocautery utilized to make subcutaneous  pocket.  Kanamycin irrigation was utilized to irrigate the pocket.  Electrocautery was utilized to assure hemostasis.  The Medtronic Sensia  dual-chamber pacemaker serial number [id number] was connected to the  atrial and ventricular leads and placed back in the subcutaneous pocket.  Generator secured with silk suture.  At this point Kanamycin irrigation  utilized to irrigate the pocket.  Electrocautery utilized to assure  hemostasis.  Incision was then closed with a layer of 2-0 Vicryl  followed by layer of 3-0 Vicryl.  Benzoin painted on the skin Steri-  Strips were applied and pressure dressing was placed and the patient was  returned to her room in satisfactory condition.   COMPLICATIONS:  There were no immediate procedure complications.  RESULTS:  Demonstrates successful implantation of a Medtronic dual-  chamber pacemaker in a patient with symptomatic bradycardia with no  immediate procedure complications.      Doylene Canning. Ladona Ridgel, MD  Electronically Signed     GWT/MEDQ  D:  11/18/2006  T:  11/19/2006  Job:  [id number]   cc:   Ramon Dredge L. Juanetta Gosling, M.D.  Gerrit Friends. Dietrich Pates, MD, [facility]

## 2010-07-29 NOTE — Group Therapy Note (Signed)
[name], PUERTA NO.:  [id number]   MEDICAL RECORD NO.:  [id number]          PATIENT TYPE:  INP   LOCATION:  A219                          FACILITY:  [facility]   PHYSICIAN:  Edward L. Juanetta Gosling, M.D.DATE OF BIRTH:  [birth year]   DATE OF PROCEDURE:  11/04/2006  DATE OF DISCHARGE:                                 PROGRESS NOTE   SUBJECTIVE:  The problem is atrial fibrillation.   The patient states she is feeling better.  She did not have any problems  with low heart rate last night and she has, overall, improved.  She has  no new complaints.   OBJECTIVE:  VITAL SIGNS:  Her physical examination shows her temperature  97, pulse 70, respirations 18, blood pressure 122/61, and O2 sat 97% on  room air.  She does not have significant orthostatic blood pressure  changes.   LABORATORY DATA:  Her white blood count today is 11,900 and hemoglobin  is 8.4.  Stools are negative for blood.  Her PT is 25.3 with an INR of  2.2.  Electrolytes are normal.  BUN is better at 34 and creatinine 1.3.   ASSESSMENT:  The patient is improved.   She does not have any low heart rates documented.  Her renal function  has improved.  She is still anemic and slightly worse with that.   PLAN:  My plan then is to try to see if we can get her up and moving  around.  I have encouraged her to ambulate some so we can see if her  heart rate goes up.  The problem now is going to be if her heart rate  becomes elevated we may run into problems.      Edward L. Juanetta Gosling, M.D.  Electronically Signed     ELH/MEDQ  D:  11/04/2006  T:  11/06/2006  Job:  [id number]

## 2010-07-29 NOTE — Op Note (Signed)
NAMEJAZLENE, [name]                ACCOUNT NO.:  [id number]   MEDICAL RECORD NO.:  [id number]          PATIENT TYPE:  AMB   LOCATION:  DAY                           FACILITY:  [facility]   PHYSICIAN:  R. Roetta Sessions, M.D. DATE OF BIRTH:  1915-09-16   DATE OF PROCEDURE:  03/23/2007  DATE OF DISCHARGE:                               OPERATIVE REPORT   PROCEDURE:  Esophagogastroduodenoscopy with biopsy.   INDICATIONS FOR PROCEDURE:  Patient is an 77 year old lady with numerous  medical problems, on anticoagulation with Coumadin currently, who  recently suffered a bout of diverticulitis, who has improved remarkably  with a course of antibiotics.  Her INR has been somewhat elevated and  [facility] has backed off her Coumadin, although she continues  to take this agent.  She has been taking ibuprofen concomitantly with  Coumadin and has had some black, tarry bowel movements recently, also  some epistaxis, both of which have improved considerably recently since  she stopped taking ibuprofen at our request.  Esophagogastroduodenoscopy  is now being done to further evaluate recent abdominal pain, black  stools, in the setting of Coumadin and ibuprofen therapy.  This approach  has been discussed with the patient at some length previously and again  today at the bedside.  All questions were answered and all parties are  agreeable.   PROCEDURE NOTE:  Oxygen saturation, blood pressure, pulse and  respirations were monitored throughout the entirety of the procedure.   CONSCIOUS SEDATION:  Versed 4 mg IV, Demerol 100 mg IV in divided doses.  Cetacaine spray for topical oropharyngeal anesthesia.   INSTRUMENT:  Pentax video chip system.   FINDINGS:  Examination of tubular esophagus revealed a Schatzki's ring.  Otherwise, esophageal mucosa appeared normal.  The EG junction was  easily traversed.  On entering the stomach, the gastric cavity was  emptied and insufflated well with air.  A  thorough examination of the  gastric mucosa, including retroflexed view of the proximal stomach,  esophagogastric junction, demonstrated small hiatal hernia and scar,  versus partial healing remnants of a prepyloric gastric ulcer, just on  the gastric side of the pyloric channel.  Please see photos.  There was  no infiltrative process or other abnormality.  The pylorus was patent  and easily traversed.  Examination of the bulb and second portion  revealed a couple of folds focally and between D1 and D2 that had  verrucous/edematous appearing mucosa.  Please see photos.  I took the  standard biopsy forceps, two small bites, with minimal bleeding,  observed the area for approximately three minutes and no more than a  couple of mL of blood-loss was noted.  The patient tolerated the  procedure well and was reacted.   ENDOSCOPY IMPRESSION:  1. Schatzki's ring, otherwise normal esophagus.  No symptoms of      dysphagia.  Ring not manipulated.  2. Small hiatal hernia.  3. Scar versus healing prepyloric ulcer (more the former rather than      the latter), otherwise normal stomach.  4. Verrucous/adenomatous local area of folds between the bulb and  the      second portion, biopsied, with minimal bleeding.  Otherwise, D1, D2      appeared normal.   RECOMMENDATIONS:  1. I am glad to see Ms. Aslin is no longer taking ibuprofen.  She      should not take any nonsteroidals while taking Coumadin.  2. She is to continue on Zegerid 40 mg early daily.  3. She previously had gastric biopsies negative for Helicobacter      pylori.  Go ahead and check serologies to double-check the negative      status.  4. Follow up on pathology.  5. Further recommendations to follow.      Jonathon Bellows, M.D.  Electronically Signed     RMR/MEDQ  D:  03/23/2007  T:  03/23/2007  Job:  [id number]   cc:   Ramon Dredge L. Juanetta Gosling, M.D.  Fax: [phone]

## 2010-07-29 NOTE — Group Therapy Note (Signed)
NAMEMYNDI, WAMBLE NO.:  [id number]   MEDICAL RECORD NO.:  [id number]          PATIENT TYPE:  INP   LOCATION:  A226                          FACILITY:  [facility]   PHYSICIAN:  Edward L. Juanetta Gosling, M.D.DATE OF BIRTH:  Jun 30, 1915   DATE OF PROCEDURE:  DATE OF DISCHARGE:  10/06/2006                                 PROGRESS NOTE   Ms. Furney is overall much improved.  She says she has no new complaints.  She feels well.  Her heart rates have been between 70 and 90 through the  night.  Her exam otherwise shows that her temperature is 97.5, pulse is  75 and irregular, respirations are 16, blood pressure 122/63, O2 sats  95% on room air.  Her weight 72.2, down by another 1.4 kg.  Her  electrolytes show potassium is 3.4, so we need to replace that.  BUN is  24, creatinine 1.45.  Her chest is clear.  Her heart is fairly slow,  slightly irregular.   ASSESSMENT:  She has CHF on the basis of atrial fibrillation and  diastolic dysfunction of the heart.  She has hypertension, well  controlled.  She is hypokalemic, will replace that before she goes, but  I think we can discharge her home today.  Please see discharge summary  for details.      Edward L. Juanetta Gosling, M.D.  Electronically Signed     ELH/MEDQ  D:  10/06/2006  T:  10/06/2006  Job:  [id number]

## 2010-07-29 NOTE — Op Note (Signed)
NAMEEMARI, HREHA                ACCOUNT NO.:  [id number]   MEDICAL RECORD NO.:  [id number]          PATIENT TYPE:  AMB   LOCATION:  DAY                           FACILITY:  [facility]   PHYSICIAN:  R. Roetta Sessions, M.D. 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  Last EGD was  back in February of this year.  This is a very difficult lesion to  approach, and it was painted with APC, and the biopsies at that time  demonstrated adenomatous polypoid lesion.  She has done well and her  stools are chronically dark on iron therapy.  She returns now for  additional treatment, resection is feasible.  This approach was  discussed with the patient.  Risks, benefits, alternatives, and  limitations have been reviewed.  Her last dose of Coumadin was on August 17, 2007.  Please see the documentation in the medical record.   PROCEDURE NOTE:  O2 saturation, blood pressure, and pulse of the patient  was monitored throughout the entire procedure.  Conscious sedation,  Versed 4 mg IV and Demerol 75 mg IV in divided doses.   INSTRUMENT:  Pentax video chip system.   FINDINGS:  Examination of the tubular esophagus revealed a subtle small  shallow diverticulum at the proximal esophagus and 1 small 2-3 mm raised  pale nodule consistent with squamous papilloma not biopsied, and a  noncritical Schatzki ring; otherwise, esophageal mucosa appeared  unremarkable.  EG junction easily traversed and into the stomach, gas  cavity was emptied, and insufflated well with air.  Thorough examination  of the gastric mucosa including retroflexed view of the proximal stomach  demonstrated small hiatal hernia and multiple 1-3 mm fundic gland-type  polyps.  The gastric mucosa, otherwise, appeared normal.  Pylorus was  patent and easily traversed.  Examination of the bulb, second portion,  again revealed somewhat of a sprawling semilunar polypoid lesion;  however, inner the bulb with peristalsis, I was able to see a good with  this polyp, and it is certainly had a large pedunculated component in  part with a sessile sprawling component.  Please see multiple photos.  A  good portion was a minimal piecemeal snare polypectomy.  Using  scleral  needle, I lifted this lesion away from the duodenal wall with sterile  saline injected in 1/2 mL increments, this worked nicely.  Engaged most  of the polyp of the pedunculated component with the snare and removed it  with hot snare cautery.  There was no bleeding with this maneuver.  I  attempted the piecemeal to remove additional pieces, but with  peristalsis and retraction, this was very difficult to get out and  decided to go ahead and settle for the 1.5-cm piece of tissue.  Recovery  was brought out with a [employer].  The patient tolerated this procedure  very well.   IMPRESSION:  1.  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  Noncritical Schatzki ring, not manipulated.  2. Small hiatal hernia, multiple tiny fundic gland-type gastric      polyps, not manipulated.  Patent pylorus complex      pedunculated/sessile lesion straddling at D1 and D2 as described      above, status post partial saline-assisted piecemeal polypectomy as      described above.   RECOMMENDATIONS:  1. Follow up on path.  2. Ms. Trilby Drummer not to take any nonsteroids for at least 14 days (she does      easily take in anyway).  3. She is not to resume her Coumadin until August 29, 2007.  4. Further recommendations to follow.  She will need a subsequent      procedure.      Jonathon Bellows, M.D.  Electronically Signed     RMR/MEDQ  D:  08/22/2007  T:  08/22/2007  Job:  [id number]   cc:   Ramon Dredge  L. Juanetta Gosling, M.D.  Fax: [phone]   Gerrit Friends. Dietrich Pates, MD, [facility]  [address]  [place], [place] [address]

## 2010-07-29 NOTE — Discharge Summary (Signed)
[name], [name] NO.:  [id number]   MEDICAL RECORD NO.:  [id number]          PATIENT TYPE:  INP   LOCATION:  A226                          FACILITY:  [facility]   PHYSICIAN:  Edward L. Juanetta Gosling, M.D.DATE OF BIRTH:  09/12/1915   DATE OF ADMISSION:  10/04/2006  DATE OF DISCHARGE:  07/23/2008[facility]                               DISCHARGE SUMMARY   FINAL DISCHARGE DIAGNOSIS:  1. Congestive heart failure with well preserved left ventricular      systolic function.  2. Atrial fibrillation with rapid ventricular response.  3. Evidence of sick sinus syndrome with bradycardia, probably related      to metoprolol.  4. Hypertension.  5. Hyperlipidemia.  6. Gastroesophageal reflux disease.  7. Irritable bowel syndrome.  8. History of diverticulosis of the colon.  9. History of colon polyps.  10.Gastroesophageal.  11.Status post esophageal dilatations.  12.Biliary pancreatitis requiring ERCP and sphincterotomy.  13.Previous hysterectomy.  14.Fractured neck requiring surgical repair in the remote past.  15.Chronic back pain status post surgery x2.  16.Recent removal of skin lesions by her dermatologist.  17.Asthma.  18.Anemia of chronic disease.  19.Rosacea.   HISTORY OF PRESENT ILLNESS:  Ms. Debord is an 77 year old who had been in  her usual state of pretty good health for her age when she developed  shortness of breath, a sense of feeling thumping in her year.  She  developed exertional dyspnea on the day of admission then became  palpitations, audible heart beat, called the EMS service and was brought  to the emergency room with atrial fibrillation and what appeared to be  congestive heart failure by chest x-ray, and BNP criteria.  She has not  had previous episodes of this.   PHYSICAL EXAMINATION:  GENERAL:  She is awake and alert.  VITAL SIGNS:  Heart rate 74 and irregular, respirations 20, blood  pressure 100/50, O2 sat was 94%, weight initially 75 kg.  HEART:   Showed a 1-2/6 systolic ejection murmur, first and second heart  sounds were normal.  Her heart was irregular.  Her EKG showed atrial  fibrillation with controlled ventricular response and left bundle branch  block.  Hemoglobin was 10,  creatinine was 1.3.  BNP 276.  Chest x-ray  showed bibasilar edema, small pulmonary nodules, pleural effusion.   HOSPITAL COURSE:  She initially was given metoprolol to slow her heart  rate and she developed transient marked bradycardia with near syncope,  then her heart rate has come back.  She is felt to have a component of  sick sinus syndrome.  Echocardiogram showed well-preserved left  ventricular function.  She was diuresed.  She was placed on verapamil 40  mg t.i.d. and started on anticoagulation.  After discussion with Dr.  Dietrich Pates, it was felt that she could be discharged on the 23rd to finish  her anticoagulation as an outpatient.  She is discharged home on  simvastatin 20 mg at bedtime, amitriptyline 25 mg at bedtime, ibuprofen  200 mg four times a day as needed, Tylenol 1000 mg four times a day as  needed, doxycycline 100 mg  daily which she takes for rosacea, Colace 100  mg daily, multiple vitamin one daily, vitamin B12 daily, calcium with  vitamin D 600 mg daily.  Lasix 40 mg p.o. daily, verapamil 40 mg p.o.  t.i.d., Avapro 300 mg daily and aspirin 325 mg daily, and she is going  to be on Coumadin per protocol and potassium chloride 20 mEq daily.  She  has been on a PPI at home and she is going to resume that, but she had  been on multiple different PPI, and she is not certain which one she has  at home but she knows to resume that.  She also has a face cream that  she is going to continue from her dermatologist where she had had some  lesions taken off of her face.  The changes in her medications then are  she is going to stop Azor 5/40.  She is going to start Lasix 40 mg  daily.  She is going to start Avapro 300 mg daily.  She is going to   start potassium chloride 20 mEq daily.  She is going to start verapamil  40 mg three times a day.  She is going to start aspirin 325 mg a day and  she is going to start Coumadin to take by schedule.  She is to continue  her PPI simvastatin, amitriptyline, ibuprofen, Tylenol, doxycycline,  Colace, multiple vitamin, vitamin B12 and calcium plus vitamin D.      Edward L. Juanetta Gosling, M.D.  Electronically Signed     ELH/MEDQ  D:  10/06/2006  T:  10/06/2006  Job:  [id number]

## 2010-07-29 NOTE — Assessment & Plan Note (Signed)
[facility]                        CARDIOLOGY OFFICE NOTE   [name], [name]                       MRN:          [id number]  DATE:01/02/2008                            DOB:          11/06/1915    CARDIOLOGIST:  Gerrit Friends. Dietrich Pates, MD, [facility]   PRIMARY CARE PHYSICIAN:  Oneal Deputy. Juanetta Gosling, MD   REASON FOR VISIT:  Posthospitalization followup.   HISTORY OF PRESENT ILLNESS:  [name] is a very pleasant 77 year old  female patient with a history of paroxysmal atrial fibrillation and  tachy-brady syndrome status post pacemaker implantation on chronic  Coumadin therapy as well as a history of diastolic heart failure.  She  has recently been diagnosed with chronic lymphocytic leukemia.  The  patient was recently evaluated at [facility] with chest  discomfort.  Her symptoms were somewhat concerning for ischemic heart  disease and she was eventually transferred to [facility] for  further evaluation.  She underwent cardiac catheterization by Dr.  Gala Romney.  This revealed a 40-50% proximal, 50-60% mid LAD stenosis,  normal circumflex, and a very small RCA with an 80% mid lesion.  It was  felt that her chest pain was noncardiac and she was treated medically.   The patient did have some deterioration of her renal function noted  prior to transfer to [facility].  Her medications were adjusted  somewhat.  She was noted to have some mild diastolic heart failure and  furosemide was added back at a small dosage.  The patient thinks that  she had hyperkalemia during her admission.  However, the labs I have  available to me indicate normal potassium levels.  She had a recent set  of blood work done December 27, 2007, that demonstrated potassium of 4.6.   The patient also experienced worsening anemia (hemoglobin of 7) during  her hospitalization.  She was transfused with 1 unit of packed red blood  cells.  She does follow with Dr.  Mariel Sleet for her chronic lymphocytic  leukemia.  She has had several studies in the past done by Dr. Jena Gauss for  GERD.  She also has irritable bowel syndrome.  She also has a history of  tubulovillous adenoma.   In followup today, she notes she is overall doing well.  She denies any  recurrent substernal chest discomfort.  She has chronic dyspnea on  exertion.  She describes NYHA class IIB symptoms.  She denies orthopnea  or PND.  She denies any syncope.  She does note left upper quadrant  pain.  This has been present for several months now.  She thinks it is  getting worse.  She had a chest x-ray when she was hospitalized in [facility].  This revealed no acute bony abnormality of her lower rib cage.  She denies any associated symptoms.  She denies any aggravating or  alleviating factors.  She is quite tender to palpation.   CURRENT MEDICATIONS:  Aspirin 81 mg daily, Furosemide 10 mg daily - she  has actually been taking this 20 mg a day, Potassium 2 mEq daily,  Chlorthalidone  12.5 mg daily, Verapamil 240 mg daily, Pravastatin 20 mg  daily, Benicar 40 mg daily, Lopressor 25 mg b.i.d.,  Warfarin as directed, Protonix 40 mg daily.   PHYSICAL EXAMINATION:  She is a well-nourished, well-developed female in  no distress.  Blood pressure is 120/60, pulse 68, weight 154 pounds.  HEENT:  Normal.  NECK:  Without JVD.  CARDIAC:  S1 and S2.  Regular rate and rhythm with a 1-2/6 systolic  ejection murmur heard best at the right sternal border.  LUNGS:  Clear to auscultation bilaterally.  No rales.  ABDOMEN:  Normoactive bowel sounds noted.  She is quite tender to  palpation over the left upper quadrant.  I cannot palpate her spleen.  Her abdomen is otherwise soft.  EXTREMITIES:  Without edema.  VASCULAR:  Right femoral arteriotomy site without hematoma or bruit.   DATABASE:  From December 27, 2007, INR 2.9, platelet count 291,000,  potassium 4.6, BUN 32, creatinine 1.36, hemoglobin 10.1,  white count  17,600.   ASSESSMENT AND PLAN:  1. Nonobstructive coronary artery disease by recent cardiac      catheterization.  She did have an 80% mid lesion in the RCA and      this was a very small vessel.  Dr. Derenda Fennel felt that her chest      symptoms were not related to her coronary artery disease and she      was treated medically.  She is not having any further chest      heaviness or tightness suggestive of angina.  She does have some      shortness of breath with exertion.  This seems to be fairly chronic      without significant change.  I suspect she could have some symptoms      from her RCA lesion.  We can certainly consider adjusting her      medications in the future if her symptoms seem to be more anginal      in nature.  2. Chronic anemia.  She is status post transfusion with packed red      blood cells when she was hospitalized.  Her recent hemoglobin by      blood work was 10.1 and her hematocrit was 29.8 and her MCV was      93.3.  This overall seems to be stable.  She can continue followup      with her hematologist and primary care physician.  3. Paroxysmal atrial fibrillation with tachy-brady syndrome status      post [employer] pacemaker implantation on chronic Coumadin therapy.      Her recent INR was 2.9.  She will continue followup with the      [facility]in Clinic.  4. Chronic diastolic congestive heart failure with overall preserved      left ventricular function with an ejection fraction of 55-60% by      recent echocardiography.  She seems to be optivolemic on exam.  She      will continue on her current medications the same at this point.  5. Chronic renal insufficiency.  She will continue on her same      medications outlined above.  She will have followup BMET in the      next 2 weeks to keep a close eye on her renal function and      potassium.  6. Left upper quadrant pain in a patient with recently diagnosed CLL.      It would be quite unusual for  her  to have painful splenomegaly.  I      cannot palpate her spleen on exam.  This symptom has been present      for quite some time and she feels like it is getting worse.  I have      offered to set her up for an abdominal ultrasound to further      evaluate her symptoms.  I think she should follow up further with      her primary care physician and/or her oncologist.  She agrees to do      this.  7. Dyslipidemia.  She will continue on pravastatin.  8. Gastroesophageal reflux disease.  The patient is having a hard time      affording Protonix.  She had been taking generic Zantac and seems      to be doing well with this.  I have recommended that she remain on      this for the time being.  She can follow up further with Dr. Jena Gauss.   DISPOSITION:  The patient will be brought back to follow up with Dr.  Dietrich Pates in the next 1 month or sooner p.r.n.  As noted above, I asked  her to follow up with her primary care physician for her left upper  quadrant pain and anemia.  We will ensure that she has a followup with  the [facility]madin [facility] as well as Dr. Ladona Ridgel for continued assessment of  her pacemaker.      Tereso Newcomer, PA-C  Electronically Signed      Gerrit Friends. Dietrich Pates, MD, [facility]  Electronically Signed   SW/MedQ  DD: 01/02/2008  DT: 01/03/2008  Job #: [id number]   cc:   Ramon Dredge L. Juanetta Gosling, M.D.

## 2010-07-29 NOTE — Letter (Signed)
February 02, 2008    [name] L. Juanetta Gosling, MD  [address]  [place], [place] [address]   RE:  REGNIA, MATHWIG  MRN:  [id number]  /  DOB:  Jun 20, 1915   Dear Ed:   [name] returns to the office for continued assessment and treatment of  chest discomfort, atrial fibrillation, and mild aortic stenosis.  Since  her last visit, she has done quite well.  She has continuing tenderness  at the left costal margin with some discomfort, but this has improved.  An abdominal ultrasound was unremarkable except for surgical absence of  the gallbladder.  She has had a chest x-ray that also shows no acute  findings.  She has a remarkable degree of energy for her age and in  light of her chronic anemia.  She has been treated with Aranesp by Dr.  Mariel Sleet.  Her other medications are extensive and unchanged.   PHYSICAL EXAMINATION:  GENERAL:  Remarkably sharp 77 year old woman in  no acute distress.  VITAL SIGNS:  The weight is 167, 3 pounds more than in October.  Blood  pressure 135/55, heart rate 66 and regular, respirations 14.  NECK:  No jugular venous distention; normal carotid upstrokes with  transmitted murmur bilaterally.  LUNGS:  Clear.  CARDIAC:  Normal first and second heart sounds; grade 2-3/6 basilar  systolic ejection murmur.  ABDOMEN:  Soft and nontender; no organomegaly.  EXTREMITIES:  Trace edema.  THORAX:  Tenderness to palpation over the left tenth rib.   IMPRESSION:  [name] is doing generally well.  Implantation of a  pacemaker has relieved all of her symptoms of cerebral hypoperfusion.  Congestive heart failure is compensated.  Aortic  stenosis is asymptomatic.  Her chest discomfort, at least at present, is  clearly noncardiac.  Due to her advanced age, chronic lymphocytic  leukemia, and anemia, it might be best to image that area with CT to be  sure there are no bony lesions.  Assuming that study is negative, I will  plan to see this nice woman again in 6 months.    Sincerely,      Gerrit Friends. Dietrich Pates, MD, [facility]  Electronically Signed    RMR/MedQ  DD: 02/02/2008  DT: 02/02/2008  Job #: [id number]

## 2010-07-29 NOTE — Letter (Signed)
October 14, 2006    Edward L. Juanetta Gosling, M.D.  [address]  [place], [place]. [address]   RE:  [name], [name]  MRN:  [id number]  /  DOB:  14-Sep-1915   [name] returns to the office following a recent admission to [facility] with the new onset of atrial fibrillation associated with  congestive heart failure.  She has done very well since hospital  discharge.  She notes dizziness, but this is vaguely described and  chronic.  She has had no syncope nor falls.  She is carefully following  a complex medical regime.   PHYSICAL EXAMINATION:  A very sharp, well-appearing older woman.  The weight is 154.  Blood pressure 150/60 lying and 122/60 standing.  Heart rate 70 and irregular.  Respirations 18.  NECK:  No jugular venous distention; no carotid bruits.  LUNGS:  Clear.  CARDIAC:  Irregular rhythm.  Normal first and second heart sounds.  ABDOMEN:  Soft and nontender; no organomegaly.  EXTREMITIES:  Trace edema.   EKG:  Atrial fibrillation with controlled ventricular response; left  bundle branch block.   Adjustment of warfarin dosage is not being performed by [place].   IMPRESSION:  [name] is doing extremely well with initial medical  therapy for atrial fibrillation and congestive heart failure with  preserved left ventricular systolic function.  We will to simplify her  medical regime.  Her verapamil will be changed to verapamil SR 120 mg  daily.  Amitriptyline can be discontinued.  Dicyclomine will be p.r.n.  Furosemide will be reduced to 20 mg daily.  We will monitor laboratory  studies and plan a return office visit in one month.   ADDENDUM:  INR was obtained in the office today and was unmeasurable.  We will send the patient to the lab for a prothrombin time  determination.  We will be happy to manage future adjustments of  warfarin dosage.    Sincerely,      Gerrit Friends. Dietrich Pates, MD, [facility]  Electronically Signed    RMR/MedQ  DD: 10/14/2006  DT: 10/14/2006  Job #:  [id number]

## 2010-07-29 NOTE — Discharge Summary (Signed)
[name], [name] NO.:  [id number]   MEDICAL RECORD NO.:  [id number]          PATIENT TYPE:  INP   LOCATION:  A310                          FACILITY:  [facility]   PHYSICIAN:  [name], M.D.DATE OF BIRTH:  07/07/1915   DATE OF ADMISSION:  DATE OF DISCHARGE:  10/05/2009[facility]                               DISCHARGE SUMMARY   FINAL DISCHARGE DIAGNOSES:  1. Chest discomfort, myocardial infarction ruled out.  2. Hypertension.  3. Hyperlipidemia.  4. Bradycardia requiring a pacemaker.  5. Atrial fibrillation.  6. Irritable bowel syndrome.  7. History of Schatzki ring.  8. History of cervical spine surgery for this problem.  9. Chronic lymphocytic leukemia.  10.History of villous adenoma of the colon.  11.History of lumbar back surgeries.  12.History of cholecystectomy.  13.History of hysterectomy.  14.History of bladder surgery.  15.History of adenoidectomy.  16.Mild chronic renal failure.  17.Hyperkalemia.  18.Possible congestive heart failure.   [name] is a 77 year old who was admitted on the morning of admission  with chest discomfort.  She said this started in the middle of the  night.  She was asleep, it woke her up for sleep.  It felt like a  pressure sensation.  It lasted about an hour, and after an hour she  decided that she should call for help, called the EMS and they brought  her to the emergency room.  She did take an antacid, but it did not  work.  She has an extensive past medical history as documented by the  diagnoses above.   Physical exam showed a well-developed, well-nourished female who did not  appear to be in any acute distress.  She was sitting up.  She was able  to converse.  Temperature was 97.8, pulse 67, respirations 20, blood  pressure 95/80, O2 sat was 100% on 2 L.  Height 60 inches, weight 76.5  kilos.  Her pupils were reactive.  Her nose and throat clear.  Her chest  was clear without wheezes.  Heart was regular without  murmur, gallop, or  rub.  Abdomen was soft.  Extremities showed no edema.   Her BMET showed that her BUN was 37, creatinine 1.25.  PT was 32, INR of  2.8.  Cardiac markers were negative.  Chest x-ray showed the pacemaker  and some possible congestive heart failure.   HOSPITAL COURSE:  She was treated.  She developed somewhat elevated BUN  and creatinine.  Her ARB was stopped.  She had some elevation of her  potassium.  Her potassium replacement was stopped.  It ruled out  myocardial infarction at consultation with the [facility],  and it was felt that since she has never had cardiac catheterization,  she was having chest pain that was consistent with cardiac disease that  she should undergo a cardiac catheterization.  She was taken off her  Coumadin and was started on Lovenox and was transferred to [facility] on  December 19, 2007, for probable cardiac catheterization.  Further  medications and other treatment depending on the results of cardiac  catheterization.  [name], M.D.  Electronically Signed     ELH/MEDQ  D:  12/22/2007  T:  12/23/2007  Job:  [id number]

## 2010-07-29 NOTE — Assessment & Plan Note (Signed)
[facility]                       [place] CARDIOLOGY OFFICE NOTE   [name], [name]                       MRN:          [id number]  DATE:04/06/2008                            DOB:          1915/12/14    CARDIOLOGIST:  Gerrit Friends. Dietrich Pates, MD, [facility]   PRIMARY CARE PHYSICIAN:  Oneal Deputy. [name] Gosling, MD   REASON FOR VISIT:  Shortness of breath.   HISTORY OF PRESENT ILLNESS:  [name] is a 77 year old female patient  with history of paroxysmal atrial fibrillation and tachy-brady syndrome  status post pacemaker on chronic Coumadin therapy who underwent cardiac  catheterization in October 2009.  This demonstrated nonobstructive  coronary disease.  Her worst lesion was an 80% stenosis in a very small  nondominant RCA.  She was treated medically.  She also has a history of  diastolic congestive heart failure.  She has recently been diagnosed  with chronic lymphocytic leukemia and underwent bone marrow biopsy early  last week with Dr. Mariel [name].  She has had some right-sided sacral  discomfort since then.  She does admit to some dietary indiscretion with  salt last week.  Approximately 5 days ago, she became acutely short of  breath some time in the evening.  She took Xanax which helped her calm  down in about 30 minutes.  She had been noticing some swelling and  increased shortness of breath with exertion.  She increased her Lasix  from 10 to 20 mg a day.  Her swelling is down and her weight has come  down since that time.  She denies orthopnea or PND.  She denies chest  discomfort.  The patient denies syncope.   CURRENT MEDICATIONS:  Metoprolol 25 mg daily, Verapamil 240 mg daily,  Lasix 20 mg daily, Klor-Con 20 mEq daily, Avapro 300 mg daily, Warfarin  as directed, Multivitamin day,  Vitamin B12 1 g daily, Calcium plus vitamin D, Simvastatin 20 mg daily,  Omeprazole 40 mg daily, Sodium bicarbonate 650 mg half a gram daily,  Chlorthalidone 12.5 mg  daily, Probiotic acidophilus daily, Ferrex 150 mg  Monday, Wednesday, and Friday, Aranesp q.21 days, Aspirin 81 mg daily.   SOCIAL HISTORY:  She denies tobacco abuse.   REVIEW OF SYSTEMS:  Please see HPI.  She denies fevers, chills, cough,  melena, hematochezia, hematuria, or dysuria.  Rest of the review of  systems are negative.   PHYSICAL EXAMINATION:  GENERAL:  She is a well-nourished, well-developed  elderly female in no acute stress.  VITAL SIGNS:  Blood pressure is 132/52, pulse 70, weight 167 pounds,  which is her same weight from February 02, 2008.  HEENT:  Normal.  NECK:  Without JVD.  CARDIAC:  Normal S1 and S2.  Regular rate and rhythm, 2/6 harsh  crescendo-decrescendo systolic ejection murmur heard best at the right  upper sternal border.  LUNGS:  Decreased breath sounds bilaterally.  No obvious rales.  ABDOMEN:  Soft, nontender.  EXTREMITIES:  With trace edema bilaterally.  NEUROLOGIC:  She is alert and oriented x3.  Cranial nerves II through  XII are grossly intact.   ASSESSMENT AND  PLAN:  1. Acute dyspnea in a 77 year old female with a history of coronary      disease and diastolic heart failure as outlined previously.  Her      last echocardiogram in September 2009 demonstrated an EF of 55-60%.      She had evidence of diastolic dysfunction at that time.  She had      mild aortic stenosis with a mean gradient of 18 mmHg.  She also had      moderate mitral regurgitation.  She does admit to some dietary      indiscretion recently.  I suspect that she had a high salt load      which caused fluid retention.  She describes increased weight along      with her shortness of breath.  She seems to have improved on a      higher dose of Lasix.  She will continue on 20 mg for now.  I have      recommended a CBC, BMET, and a BNP as well as a chest x-ray.  If      she has ongoing evidence of edema or elevated BNP level, we can      increase her Lasix further.  2.  Nonobstructive coronary artery disease by cardiac catheterization      in October 2009.  As noted previously, she had 40-50% proximal LAD      and a 50-60% mid LAD, and an 80% stenosis in the midvessel in a      very small nondominant RCA.  She has been treated medically.  She      has not had any chest discomfort.  She has, however, noted      worsening shortness of breath with exertion over the last several      months.  We will await workup of her shortness of breath and      possible acute-on-chronic diastolic heart failure.  If she requires      further diuresis, we will reassess her symptoms when we see her      back in followup.  She may benefit from the addition of a long-      acting nitrate.  It is possible that she may be experiencing some      shortness of breath with exertion related to her RCA lesion.  It is      possible that she likely has multifactorial dyspnea.  In any event,      we will reassess this when she returns in followup.  3. Chronic lymphocytic leukemia.  She just had a bone marrow biopsy      with Dr. Mariel [name] last week.  She is having some pain over her      right sacral region.  I have asked her to follow up with Dr.      Mariel [name] for this.  4. Anxiety.  She feels that she does have some anxiety from time-to-      time and would benefit from ongoing prescription of Xanax.  She has      had this filled in the past by Dr. Juanetta [name].  I have asked her to      follow up with him for further refills.  5. Chronic anemia.  As noted above, we will check a CBC and further      evaluation of her shortness of breath.  6. Paroxysmal atrial fibrillation with tachy-brady syndrome status      post [employer] pacemaker implantation.  She continues on chronic      Coumadin.  This was restarted recently after her bone marrow      biopsy.  Her INR today was 2.4.  We will check an EKG before she      leaves the office to reassess her rhythm given her recent symptoms.  7.  Dyslipidemia.  She is on simvastatin now with a goal LDL of less      than or equal to 70.  8. Chronic renal insufficiency.  We will check a BMET as outlined      above.  If we go up on Lasix, we will need to check another BMET in      a week in followup.   DISPOSITION:  The patient will return to see me or Dr. Dietrich Pates in 1  week in followup.  We will make further recommendations at that time if  any.      Tereso Newcomer, PA-C  Electronically Signed      Gerrit Friends. Dietrich Pates, MD, [facility]  Electronically Signed   SW/MedQ  DD: 04/06/2008  DT: 04/06/2008  Job #: [id number]   cc:   Ramon Dredge L. [name], M.D.  Ladona Horns. [name] Sleet, MD

## 2010-07-29 NOTE — Letter (Signed)
April 04, 2007    Edward L. Juanetta Gosling, M.D.  [address]  [place], [place] [address]   RE:  [name], [name]  MRN:  [id number]  /  DOB:  [birth year]   Dear Ed:   [name] returns to the office as scheduled for continued assessment and  treatment of sick sinus syndrome and chronic anticoagulation related to  her arrhythmia.  Unfortunately, she has had chronic GI blood loss and  has recently been evaluated by Dr. Jena Gauss.  She had clinical  diverticulitis that responded to a course of antibiotics.  An upper  endoscopy demonstrated a gastric ulcer, but Dr. Jena Gauss apparently does  not think this is the source of her bleeding.  Fortunately, a recent CBC  shows that her hemoglobin has increased somewhat to 9.5.  MCV was  normal.  She is taking an iron supplement.  She tells me that hemoccult  testing is positive.  Other medications include metoprolol 50 mg daily,  verapamil 240 mg daily, furosemide 20 mg daily, KCl 20 mEq daily, Avapro  300 mg daily, warfarin as directed, Zegerid 40 mg daily.   EXAM:  Very sharp older woman in no acute distress.  The weight is 158, stable.  Blood pressure 110/55, heart rate 64 and  irregular, respirations 18.  NECK:  No jugular venous distention; transmitted murmur bilaterally.  LUNGS:  Clear.  CARDIAC:  Irregular rhythm; normal first and second heart sounds; grade  2/6 - 3/6 systolic ejection murmur at the cardiac base.  ABDOMEN:  Soft and nontender; no organomegaly.  EXTREMITIES:  Trace edema; 1+ distal pulses.   IMPRESSION:  [name] is doing generally well from a cardiac standpoint.  She would like to stop taking furosemide on a daily basis.  Instructed  her in p.r.n. usage based upon her weight, which she measures daily.  Continued GI bleeding in the face anticoagulation is problematic, but  reasonable as long as her hemoglobin does not drop further.  We will  check CBC in 2 months.  I will see this nice woman again in 6 months.  In the interim, she  will be followed in anticoagulation clinic with an  INR target of 2-2.5.    Sincerely,      Gerrit Friends. Dietrich Pates, MD, [facility]  Electronically Signed    RMR/MedQ  DD: 04/04/2007  DT: 04/04/2007  Job #: [id number]   CC:    R. Roetta Sessions, M.D.

## 2010-07-29 NOTE — Letter (Signed)
January 03, 2007    [name], M.D.  [address]  [place], [place] [address]   RE:  [name], [name]  MRN:  [id number]  /  DOB:  March 24, 1915   Dear [name]:   [name] returns to the office for continued assessment and treatment of  sick sinus syndrome, now 2 months following implantation of a dual-  chamber pacemaker.  She is substantially improved with markedly  decreased dizziness.  She notes some pounding in her ear on occasion,  but this is not terribly troublesome to her.  She has continued to have  IBS with diarrhea and constipation.  Then, we started metoprolol, she  attributed diarrhea to this medication and stopped it.  Her dose of  verapamil was increased to 240 mg daily.  She notes some mid back  discomfort, sometimes radiating around to the chest.  There is no  relationship to movement of the trunk or upper extremities.  She  suffered a left scleral hemorrhage a few days ago.   CURRENT MEDICATIONS:  1. Simvastatin 20 mg daily.  2. KCl 20 mEq daily.  3. Warfarin as directed.  4. Furosemide 10 mg daily.  5. Avapro 300 mg daily.  6. Verapamil 240 mg daily.  7. Fish oil 1000 mg daily.   EXAM:  Pleasant, overweight woman.  The weight is 156, 3 pounds less than at her last visit.  Blood pressure  130/55 in the right arm and 140/60 in the left arm sitting.  Heart rate  82 and regular, respirations 16.  NECK:  No jugular venous distension; minimal transmitted murmur versus  bruits bilaterally.  LUNGS:  Coarse breath sounds at the bases with minimal rales.  CARDIAC:  Regular rhythm; grade 2/6 basalar systolic ejection murmur.  ABDOMEN:  Soft and nontender; no organomegaly.  EXTREMITIES:  Trace edema.  THORAX:  Well-healed scar over the left infraclavicular region.  Stable  pacemaker without tenderness or swelling.   RHYTHM STRIP:  Normal sinus rhythm; first degree AV block; IVCD.   IMPRESSION:  [name] is doing beautifully with her current medical  regimen.  The  etiology of her back discomfort is unclear, but this does  not appear to be a major problem.  She will be seen by the  electrophysiology service in a month or 2 for interrogation of her  device.  I will reassess this nice woman in 3 months.  We will continue  to manage warfarin dosing.    Sincerely,      Gerrit Friends. Dietrich Pates, MD, [facility]  Electronically Signed    RMR/MedQ  DD: 01/03/2007  DT: 01/04/2007  Job #: [id number]
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
## 2010-07-29 NOTE — Letter (Signed)
November 12, 2006    Ramon Dredge L. Juanetta Gosling, M.D.  [address]  [place], [place] [address]   RE:  JASLYN, BANSAL  MRN:  [id number]  /  DOB:  09-11-1915   Dear Ed:   Ms. Sampley returns to the office following a recent readmission to [facility]. She reported an episode of dizziness with a sense of  doom. On monitoring, she had some modest bradycardia into the 40s  without recurrent symptoms. She reports that she is dizzy much of the  time, but it is generally mild. There is no orthostatic component. She  frequently has symptoms when she is laying in bed. Looking at her old  chart, dizziness has been reported in the past. She describes an episode  where you came to see her in the hospital and reported severe  hypotension. Nothing is documented in the chart concerning this, and no  significant change was made in her medication. She was supposed to be  discharged on Furosemide 10 mg daily, but was sent home on her previous  dose of 20 mg. Her dose of Verapamil was also increased to 180 mg for  unclear reasons. There has been absolutely no indication of hypotension  on any one of multiple blood pressure measurements both in hospital and  at home.   She is currently taking:  1. Simvastatin 20 mg daily.  2. KCL 20 mEq daily.  3. Verapamil 180 mg daily.  4. Warfarin as directed with therapeutic anticoagulation.  5. Avapro 150 mg daily.  6. Calcium and vitamin D.  7. Furosemide 20 mg daily.   PHYSICAL EXAMINATION:  GENERAL:  Pleasant woman in no acute distress.  VITAL SIGNS:  Weight 160, 6 pounds more than in July. Blood pressure  135/70, heart rate 75 and irregular, respirations 18.  NECK:  No jugular venous distension; no carotid bruits.  LUNGS:  Clear.  CARDIAC:  Normal first and second heart sounds; modest basilar systolic  ejection murmur.  ABDOMEN:  Soft and nontender; no organomegaly.  EXTREMITIES:  Trace edema.  NEUROLOGIC:  Symmetric strength and tone; normal reflexes;  normal  cerebellar functions; normal cranial nerves; gait is cautious, but there  is no prominent gait instability.   EKG:  Atrial fibrillation with controlled ventricular response; left  bundle branch block; delayed R-wave progression.   Ms. Wigington has been wearing an event recorder for the past few days. She  reported one episode of dizziness. The rhythm at that time was atrial  fibrillation with a heart rate of 70.   IMPRESSION:  Ms. Cressy continues to be symptomatic. I believe that we are  dealing with an old problem and a new unrelated medical issue. Her  dizziness is not typically orthostatic nor is it apparently due to  bradycardia or hypotension. Accordingly, it is probably unrelated to her  sick sinus syndrome. We will complete three weeks of event recording. A  MRI study of the brain will be obtained to exclude a structural  neurologic cause. She has an appointment with an ENT physician, but this  does not seem to be vertigo at all. Nonetheless, she has profound  hearing impairment with expensive hearing aids that do not provide her  any benefit. ENT consultation might be helpful in this regard. I will  call you next week to determine if you would like her seen by a  neurologist. We might as well wait for the results of the MRI to make  that determination.  Sincerely,      Gerrit Friends. Dietrich Pates, MD, [facility]  Electronically Signed    RMR/MedQ  DD: 11/12/2006  DT: 11/14/2006  Job #: [id number]

## 2010-07-29 NOTE — Group Therapy Note (Signed)
NAMEILLYANNA, PETILLO NO.:  [id number]   MEDICAL RECORD NO.:  [id number]          PATIENT TYPE:  INP   LOCATION:  A310                          FACILITY:  [facility]   PHYSICIAN:  Edward L. Juanetta Gosling, M.D.DATE OF BIRTH:  [birth year]   DATE OF PROCEDURE:  DATE OF DISCHARGE:                                 PROGRESS NOTE   Ms. Sylvan was admitted with chest pain.  She also has cardiac  arrhythmias.  She has developed change in her renal function and  potassium level.  She has had some more left chest pain just beneath her  left breast.  She has not had any more of crushing substernal chest  pain.  She continues to have diarrhea based on her irritable bowel  syndrome.   PHYSICAL EXAMINATION:  VITAL SIGNS:  Temperature is 97.9, pulse 71,  respirations 20, blood pressure 133/63, O2 sats 100% on 2 liters.  CHEST:  Clear.  HEART:  Actually quite regular.  ABDOMEN:  Soft.   LABORATORY DATA:  This morning, BMET shows her potassium is 5.7, BUN of  43, creatinine 1.77.  Her potassium has been discontinued.  She is going  to have another BMET.  Her prothrombin time 37.1 with an INR of 3.4.   We have discontinued Benicar, discontinued the potassium replacement and  I discussed this situation with Dr. Dietrich Pates and he is contemplating  whether she needs cardiac catheterization.  If so, this would be done on  Monday.  We need to get her potassium down a bit and her renal function  a bit better.      Edward L. Juanetta Gosling, M.D.  Electronically Signed     ELH/MEDQ  D:  12/16/2007  T:  12/16/2007  Job:  [id number]

## 2010-07-31 ENCOUNTER — Ambulatory Visit (INDEPENDENT_AMBULATORY_CARE_PROVIDER_SITE_OTHER): Payer: Medicare Other | Admitting: Otolaryngology

## 2010-07-31 ENCOUNTER — Ambulatory Visit (INDEPENDENT_AMBULATORY_CARE_PROVIDER_SITE_OTHER): Payer: Medicare Other | Admitting: *Deleted

## 2010-07-31 DIAGNOSIS — R040 Epistaxis: Secondary | ICD-10-CM

## 2010-07-31 DIAGNOSIS — Z7901 Long term (current) use of anticoagulants: Secondary | ICD-10-CM

## 2010-07-31 DIAGNOSIS — I4891 Unspecified atrial fibrillation: Secondary | ICD-10-CM

## 2010-07-31 LAB — POCT INR: INR: 2.3

## 2010-08-01 NOTE — Group Therapy Note (Signed)
[facility]  Patient:    [name], [name] Visit Number: [id number] MRN: [id number]          Service Type: MED Location: 2A A227 01 Attending Physician:  [name] Dictated by:   Kari Baars, M.D. Admit Date:  06/22/2001                               Progress Note  PROBLEMS:  Osteoarthritis, osteoporosis, hypertension, gallstone, pancreatitis, gastroesophageal reflux disease.  SUBJECTIVE:  Ms. Carico says she is feeling quite well and she is actually set for ERCP today.  She has no other complaints and says that her stomach is actually doing fairly well.  PHYSICAL EXAMINATION  CHEST:  Clear.  ABDOMEN:  Minimally tender, but not much.  HEART:  Regular.  VITAL SIGNS:  Blood pressure 120/72.  EXTREMITIES:  No edema.  ASSESSMENT:  She is doing better.  PLAN:  Have the ERCP today and it is possible that she will be able to be discharged tomorrow.  That depends largely on how she does as far as after her ERCP. Dictated by:   Kari Baars, M.D. Attending Physician:  [name] DD:  06/24/01 TD:  06/24/01 Job: [id number]

## 2010-08-01 NOTE — Op Note (Signed)
NAMEMYAN, LOCATELLI                ACCOUNT NO.:  [id number]   MEDICAL RECORD NO.:  [id number]          PATIENT TYPE:  AMB   LOCATION:  DAY                           FACILITY:  [facility]   PHYSICIAN:  Lionel December, M.D.    DATE OF BIRTH:  [birth year]   DATE OF PROCEDURE:  08/05/2005  DATE OF DISCHARGE:                                 OPERATIVE REPORT   PROCEDURE:  Esophagogastroduodenoscopy.   INDICATIONS:  Ms. Buresh is an 77 year old Caucasian female with a several  year history of symptoms GERD who has been on PPI therapy.  She has been  tried on all PPIs, but symptoms remain poorly controlled.  She is undergoing  diagnostic EGD.  The procedure is reviewed with the patient and informed  consent was obtained.   MEDS FOR CONSCIOUS SEDATION:  Benzocaine spray for pharyngeal topical  anesthesia, Demerol 25 mg IV, Versed 3 mg IV.   FINDINGS:  The procedure was performed in the endoscopy suite.  The  patient's vital signs and O2 sats were monitored during the procedure and  remained stable.  The patient was placed in the left lateral position.  The  Olympus videoscope was passed via oropharynx without any difficulty into  esophagus.   Esophagus.  The mucosa of the esophagus was normal throughout.  The GE  junction was at 36 cm from the incisors.  No stricture or hernia was noted.   Stomach:  The stomach was empty and distended very well with insufflation.  The folds of the proximal stomach were normal except one fold which was  twice the diameter and erythematous.  The mucosa of the body also had  streaks of erythema with a geographic pattern.  No erosions or ulcers were  noted.  The pyloric channel was patent.  The angularis, fundus and cardia  were examined by retroflexing the scope and were normal.  A biopsy was taken  from this swollen fold and surrounding mucosa for routine histology.   Duodenum:  The bulbar mucosa was normal.  The scope was passed in the second  part of the duodenum  where mucosa and folds were normal.  The endoscope was  withdrawn.  The patient tolerated the procedure well.   FINAL DIAGNOSIS:  Swollen erythematous gastric folds with patchy erythema,  this is possibly hypertrophic gastritis.  Biopsy taken for routine  histology.   RECOMMENDATIONS:  She will continue anti-reflux measures and Zegerid and  Carafate as recommended.  I will be contacting the patient with biopsy  results and further recommendations.      Lionel December, M.D.  Electronically Signed     NR/MEDQ  D:  08/05/2005  T:  08/05/2005  Job:  [id number]   cc:   Ramon Dredge L. Juanetta Gosling, M.D.  Fax: [phone]

## 2010-08-01 NOTE — Discharge Summary (Signed)
[facility]  Patient:    NAKIRA, LITZAU Visit Number: [id number] MRN: [id number]          Service Type: MED Location: 2A A227 01 Attending Physician:  [name] Dictated by:   Kari Baars, M.D. Admit Date:  06/22/2001                             Discharge Summary  ADDENDUM  DISCHARGE MEDICATIONS:  The patient is to continue on Elavil 25 mg h.s., verapamil 240 mg daily, Protonix 40 mg daily, Augmentin 1000 mg b.i.d., Darvocet-N 100 one q.6 h. p.r.n. pain, Tylenol and Advil also p.r.n. for pain, and she gets to discontinue her previous Lipitor at least for the time being until her liver enzymes come back to normal. Dictated by:   Kari Baars, M.D. Attending Physician:  [name] DD:  06/25/01 TD:  06/25/01 Job: [id number]
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
## 2010-08-01 NOTE — Op Note (Signed)
[name], [name]                ACCOUNT NO.:  [id number]   MEDICAL RECORD NO.:  [id number]          PATIENT TYPE:  AMB   LOCATION:  DAY                           FACILITY:  [facility]   PHYSICIAN:  Lionel December, M.D.    DATE OF BIRTH:  08-31-1915   DATE OF PROCEDURE:  12/17/2005  DATE OF DISCHARGE:                                  PROCEDURE NOTE   PROCEDURE:  Colonoscopy.   ENDOSCOPIST:  Lionel December, M.D.   INDICATIONS:  [name] is an 77 year old Caucasian female with irregular  bowel movements and lower abdominal pain, whose family history is also  positive for colon carcinoma, but she has been reluctant to undergo  colonoscopy until now.  It is suspected that she has IBS; however colonic  neoplasm needs to be ruled out.  Procedure risks were reviewed the patient  and informed consent was obtained.   MEDICATIONS FOR CONSCIOUS SEDATION:  Demerol 30 mg IV, Versed 3 mg IV.   FINDINGS:  Procedure performed in endoscopy suite.  The patient's vital  signs and O2 SATs were monitored during the procedure and remained stable.  The patient was placed in the left lateral decubitus position and rectal  examination performed.  No abnormality noted on external or digital exam.  An Olympus videoscope was placed in the rectum and advanced under direct  vision into the sigmoid colon, which was tortuous with multiple diverticula,  some moderately large in size.  Slowly and carefully the scope was passed  into descending and sigmoid colon and beyond.  The patient was turned in  supine position, abdominal pressure was used and the scope was passed into  cecum, which was identified by appendiceal orifice and ileocecal valve.  Pictures were taken for the record.  As the scope was withdrawn, colonic  mucosa was examined for the second time.  There was a small polyp at  ascending colon, which was ablated via cold biopsy.  Mucosa of the rest of  the colon was normal.  Rectal mucosa similarly was normal.   Scope was  retroflexed to examine anorectal junction.  Distal rectal erythema was felt  to be irritation due to prep.  The patient also complains of burning rectal  pain since she took the prep.  Endoscope was straightened and withdrawn.  The patient tolerated the procedure well.   There was distal rectal mucosal erythema.  Endoscope was straightened and  withdrawn.  The patient tolerated the procedure well.   FINAL DIAGNOSES:  1. Pancolonic diverticulosis, but most of the diverticula are at sigmoid      colon.  2. Small polyp ablated via cold biopsy from the ascending colon.  3. Distal proctitis related to the prep.   RECOMMENDATIONS:  1. She will resume her usual medications.  2. She will continue dicyclomine at 10 mg before breakfast.  3. High-fiber diet.  4. FiberChoice two tablets daily and Colace two tablets at bedtime.  5. I will be contacting patient with results of biopsy and plan to see her      back in the office in 8 weeks.  Lionel December, M.D.  Electronically Signed     NR/MEDQ  D:  12/17/2005  T:  12/18/2005  Job:  [id number]   cc:   Ramon Dredge L. Juanetta Gosling, M.D.  Fax: [phone]

## 2010-08-01 NOTE — Group Therapy Note (Signed)
[facility]  Patient:    KHLOI, RAWL Visit Number: [id number] MRN: [id number]          Service Type: MED Location: 2A A227 01 Attending Physician:  [name] Dictated by:   Kari Baars, M.D. Admit Date:  06/22/2001                               Progress Note  PROBLEMS:  Chest and abdominal pain.  HISTORY:  Ms. Benedetti was admitted with what appeared to be chest discomfort. She had elevated liver function tests and then had amylase and lipase done which were abnormal.  It appears that she may well have a biliary problem rather than a cardiac problem.  Dr. Karilyn Cota has been consulted and his help is appreciated.  PHYSICAL EXAMINATION  ABDOMEN:  Actually not very tender.  Soft.  CHEST:  Clear.  HEART:  Regular.  LABORATORIES:  Amylase and lipase were as mentioned, elevated.  Her ultrasound of the right upper quadrant area shows slightly enlarged bile duct, otherwise okay.  ASSESSMENT:  She has what is probably a biliary problem as mentioned with biliary pancreatitis.  PLAN:  She is going to continue on clear liquids.  I am going to give her some morphine for pain as needed.  Dr. Karilyn Cota plans to do ERCP tomorrow.  No other new treatments now. Dictated by:   Kari Baars, M.D. Attending Physician:  [name] DD:  06/23/01 TD:  06/24/01 Job: [id number]

## 2010-08-01 NOTE — Group Therapy Note (Signed)
[facility]  Patient:    DEON, IVEY Visit Number: [id number] MRN: [id number]          Service Type: MED Location: 2A A227 01 Attending Physician:  [name] Dictated by:   Kari Baars, M.D. Admit Date:  06/22/2001                               Progress Note  PROBLEM:  Biliary pancreatitis.  SUBJECTIVE:  This patient says that she feels great this morning.  She underwent ERCP and sphincterotomy yesterday and she is doing very well.  She is not having any abdominal pain.  She has had no nausea or vomiting.  OBJECTIVE:  Her physical exam today shows that her chest is quite clear.  Her blood pressure 120/80.  She is afebrile.  Her abdomen is soft without any masses or tenderness.  ASSESSMENT:  She is much improved.  PLAN:  After discussion with Dr. Karilyn Cota, we both feel that she is ready for discharge so we will plan for discharge today.  Please see discharge summary for details. Dictated by:   Kari Baars, M.D. Attending Physician:  [name] DD:  06/25/01 TD:  06/27/01 Job: [id number]

## 2010-08-01 NOTE — Discharge Summary (Signed)
[facility]  Patient:    [name], [name] Visit Number: [id number] MRN: [id number]          Service Type: MED Location: 2A A227 01 Attending Physician:  [name] Dictated by:   Kari Baars, M.D. Admit Date:  06/22/2001                             Discharge Summary  FINAL DISCHARGE DIAGNOSES: 1. Abdominal pain. 2. Biliary pancreatitis. 3. Cystectomy. 4. Hypertension. 5. Hyperlipidemia. 6. Gastroesophageal reflux disease. 7. Osteoarthritis. 8. Osteoporosis.  HISTORY OF PRESENT ILLNESS:  The patient was admitted with abdominal and chest discomfort. Initially, her symptoms were more related to her chest. She was noted to have elevated liver function tests at that time, and she had been on Lipitor and thought that perhaps that was the problem, but because of the elevated liver function tests, she had amylase and lipase done which were elevated, and it was felt that she probably had some sort of pancreatitis. She does have a history of hypertension, hyperlipidemia, gastroesophageal reflux disease, osteoarthritis and osteoporosis. Her exam on admission showed that she appeared to be uncomfortable. Her chest had some rhonchi bilaterally. Her heart was regular without gallop. Her abdomen was soft with minimal right upper quadrant tenderness but fairly minimal. Extremities showed no edema. CNS exam was grossly intact. Her lab work was white blood count 14,400, hemoglobin 12, platelets 283. Her electrolytes showed sodium 137, potassium 3.8, chloride 106, CO2 25, BUN 19, creatinine 1, glucose 131. Her amylase 395, lipase 730.  HOSPITAL COURSE:  She was started on IV fluids, initially was placed on Lovenox and on nitroglycerin, but it became clear that she did not need these and these were discontinued. She underwent right upper quadrant ultrasound which showed that her bile duct was enlarged. She then underwent a ERCP and had a sphincterotomy of  the ampulla of Vater at that time. By the time of discharge she was much improved, had no further discomfort, had no nausea or vomiting, her liver enzymes were better although not back to normal.  DISPOSITION:  She is discharged home in improved condition to follow up in my office at her regularly scheduled appointment which is on May 1st. Dictated by:   Kari Baars, M.D. Attending Physician:  [name] DD:  06/25/01 TD:  06/25/01 Job: [id number]

## 2010-08-01 NOTE — Op Note (Signed)
[facility]  Patient:    [name], [name] Visit Number: [id number] MRN: [id number]          Service Type: MED Location: 2A A227 01 Attending Physician:  [name] Dictated by:   Lionel December, M.D. Proc. Date: 06/24/01 Admit Date:  06/22/2001   CC:         Kari Baars, M.D.   Operative Report  PROCEDURE:  Endoscopic retrograde cholangiopancreatography with endoscopic sphincterotomy.  ENDOSCOPIST:  Lionel December, M.D.  INDICATIONS FOR PROCEDURE:  This patient is an 77 year old Caucasian female who presents with biliary colic and also suspected to have mild pancreatitis. Imaging studies revealed markedly dilated CBD and CHD.  She is suspected either to have a choledocholithiasis or ______.  She is undergoing diagnostic and therapeutic studies.  The procedure and risks were reviewed with the patient and an informed consent was obtained.  PREOPERATIVE MEDICATIONS:  Cetacaine spray for pharyngeal topical anesthesia, Fentanyl 50 mcg IV in divided dose, Versed 8 mg IV in divided dose, and Glucagon 0.75 mg IV in divided dose.  FINDINGS:  Procedure performed in the OR as I could not get an opening in the radiology department.  A fluoroscopic unit was used and this was assisted by Dr. Tyron Russell, assistant.  The patient was placed in semiprone position, vital sign and O2 saturations were monitored during the procedure and remained stable.  A therapeutic Olympus videoduodenoscope was passed via the oropharynx into the esophagus and stomach and across the buttock into the vulva and descending duodenum.  Ampulla of Vater was identified, it appeared to be normal. Cannulation was attempted with Bard sphincterotome and 035 guidewire.  CBD was cannulated. CHD and CBD were moderately dilated, but there were no filling defects.  There was distal tapering suggesting papillary stenosis.  There was just inferior displacement of extrahepatic biliary system  possibly due to ptosis. Intrahepatic biliary radicles are also mildly dilated.  There was no stricture. A sphincterotomy was performed with a gush of contrast and bile into the duodenum.  I subsequently pass an 8.5-mm balloon through the bile duct, but there was no stone present.   Endoscope was withdrawn.  The patient tolerated the procedure well.  FINAL DIAGNOSIS: 1. 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  No evidence of choledocholithiasis. 2. Pancreatic duct was not studied. 3. Endoscopic sphincterotomy performed with excellent biliary drainage.  RECOMMENDATIONS:  She will be on clear liquids today.  Labs will be repeated in the a.m.  If she does well she should be able to go home within the next 24-48 hours. Dictated by:   Lionel December, M.D. Attending Physician:  [name] DD:  06/24/01 TD:  06/26/01 Job: [id number]

## 2010-08-01 NOTE — H&P (Signed)
[name], [name]                ACCOUNT NO.:  [id number]   MEDICAL RECORD NO.:  [id number]          PATIENT TYPE:  AMB   LOCATION:                                FACILITY:  [facility]   PHYSICIAN:  Lionel December, M.D.    DATE OF BIRTH:  1915-04-28   DATE OF ADMISSION:  DATE OF DISCHARGE:  LH                                HISTORY & PHYSICAL   PRESENTING COMPLAINT:  Lower abdominal pain, irregular bowel movements.   SUBJECTIVE:  [name] is an 77 year old Caucasian female who presents with  irregular bowel movements and lower abdominal pain.  She has history of IBS.  However, she states her bowel habits have changed, and she is having lower  abdominal cramps every time she eats, which was not the case before.  She  was seen in the office in May 2007, primarily for hoarseness and a raspy  voice.  She was felt to have GERD and was not responding therapy.  She had  EGD in May 2007 which was normal other than focal gastritis, and her  Helicobacter pylori stains were negative.  She states as far as her  hoarseness is concerned, it is completely gone, and now she is able to sing  in her church choir.  She either has diarrhea and/or constipation.  When she  has diarrhea, this may go on for 3-4 days, and then she has constipated and  may go a couple of days without a bowel movement.  The only time she has  seen blood in her stools is with loose bowel movements.  Her bowel movement  occurs within 5-30 minutes of each meal and associated with cramps across  the lower abdomen.  She is taking OTC Imodium which helps the diarrhea but  not cramps.  She remains with good appetite.  She denies nausea or vomiting.  She also denies frank bleeding or melena.  She states she was having  problems with balance and weakness.  Dr. Juanetta Gosling prescribed physical  therapy, and she feels this has made a great deal of difference, and she  feels much better.  The patient has been advised a colonoscopy in the past  for  screening purposes, but she has declined to have it.  She had a flexible  sigmoidoscopy and a barium enema back in 1994.   She also complains of blood pressure remaining high.  She has Lasix, but she  does not take it regularly because it induces diuresis and she cannot do  anything else.   She is on Tarka 4/240 CR daily, simvastatin 20 mg daily, amitriptyline 25 mg  daily, Lasix 40 mg daily, p.r.n., MVI daily, calcium with D 500 mg daily,  Tylenol no more than 2 g per day, ibuprofen 800 mg per day in divided dose  p.r.n., Zegerid 40 mg q.a.m., Imodium OTC p.r.n.   PAST MEDICAL HISTORY:  1. Hypertension.  2. Hyperlipidemia.  3. GERD.  4. IBS.  5. She had her esophagus dilated back in 1992 and 1994, but not on he EGD      of May this  year.  In January 1992, February 1994, and October 1996,      but not recently.   PRIOR SURGERIES:  1. Cholecystectomy in 1990.  2. She presented with biliary pancreatitis in April 2003 and had a      sphincterotomy for papillary stenosis.  She could also have had      microlithiasis, but none were documented.  3. She had a hysterectomy in 1972.  4. Neck surgery for disc problem in 1976.  5. She has had back surgery in 1989 and redo in 1994.   ALLERGIES:  NKDA.   FAMILY HISTORY:  Positive for colon carcinoma in her mother who died at age  92.  Reason she opted not to have a colonoscopy was because her sister had a  perforation on a high risk for colonoscopy [place], [place]).   SOCIAL HISTORY:  She is widowed.  She has one son.  Another son died of  Hodgkin's at age 23.  She is a retired Youth worker. She does not drink  alcohol or smoke cigarettes.   PHYSICAL EXAMINATION:  GENERAL:  A pleasant, well-developed, well-nourished  Caucasian female who appears much younger than stated age.  She weighs 171  pounds.  She is 5 feet 2 inches tall.  Pulse 84 per minute, blood pressure  136/74, temperature is 98.8.  HEENT:  Conjunctiva is  pink.  Sclerae is anicteric.  Oropharyngeal mucosa is  normal.  NECK:  No neck masses are noted.  CARDIAC:  Regular rhythm.  Normal S1 and S2.  She has faint systolic  ejection murmur at aortic area.  LUNGS:  Clear to auscultation.  ABDOMEN:  Symmetrical, soft, with mild tenderness in periumbilical area  which is not reproducible. No organomegaly or masses noted.  RECTAL:  Deferred.  EXTREMITIES:  She does not have peripheral edema.   ASSESSMENT:  1. I suspect Calayah's irregular bowel movements and lower abdominal pain      are secondary to IBS.  However, given her family history, we need to      make sure she does not have a colonic neoplasm on a stricture.  She      states she has been thinking about a colonoscopy and has decided to      proceed with it.  2. Chronic gastroesophageal reflux disease symptoms very well controlled      with Zegerid.   PLAN:  1. Dicyclomine 10 mg before breakfast.  A prescription given for 30 with a      refill.  2. Colonoscopy both for diagnostic and screening purposes to be performed      at [facility] in the near future.  I have reviewed the procedure risks with      the patient, and she is agreeable.      Lionel December, M.D.  Electronically Signed     NR/MEDQ  D:  12/09/2005  T:  12/09/2005  Job:  [id number]   cc:   Ramon Dredge L. Juanetta Gosling, M.D.  Fax: [phone]

## 2010-08-01 NOTE — H&P (Signed)
[facility]  Patient:    ELIM, PEALE Visit Number: [id number] MRN: [id number]          Service Type: OBV Location: 2A A227 01 Attending Physician:  Annamarie Dawley Dictated by:   Kari Baars, M.D. Admit Date:  06/22/2001                           History and Physical  REASON FOR ADMISSION:  Chest discomfort.  HISTORY OF PRESENT ILLNESS:  Ms. Dunwoody is an 77 year old, with a long-known history of hypertension, who was in her usual state of fairly poor health when she developed chest discomfort.  She also had a long-known history of gastroesophageal reflux disease.  She says this felt different than her GERD symptoms.  She was treated in the emergency room with nitroglycerin and developed hypotension and low heart rate, which responded to IV fluids.  She then had another episode of chest pain in the emergency room which was more typical of her episodes of reflux, and this resolved with GI cocktail.  She says that for the last several months she has been having problems with being short of breath when she moves around.  She has not had any previous episodes of chest pain.  She had a stress test done about five years ago that did not show any definite evidence of ischemia.  As mentioned, she does have a long history of episodes of bronchitis, a history of GERD.  She has osteoporosis. She has severe arthritis.  She had been started on Augmentin XR 1000 mg b.i.d. yesterday in my office.  FAMILY HISTORY:  Positive for multiple family members who have had hypertension and other atherosclerotic problems.  SOCIAL HISTORY:  She is a nonsmoker.  She does not drink any alcohol.  She lives at home alone.  REVIEW OF SYSTEMS:  Except as mentioned, negative.  PAST MEDICAL HISTORY:  As above.  PHYSICAL EXAMINATION:  GENERAL:  Well-developed, well-nourished female, who does not appear to be in any acute distress at this point.  HEENT:  Nose and throat  clear.  Mucous membranes moist.  NECK:  Supple without masses.  CHEST:  Clear without wheezes, rales or rhonchi.  HEART:  Regular without murmur, gallop, or rub.  ABDOMEN:  Soft.  No masses felt.  EXTREMITIES:  No edema.  VITAL SIGNS:  Blood pressure 120/70, pulse 68, respirations 18.  She is afebrile.  LABORATORY DATA:  Initial cardiac enzymes are normal.  ASSESSMENT:  1. She has chest pain which is different from her previous chest pain.  2. She has a history of hypertension.  3. She has a history of gastroesophageal reflux disease.  PLAN:  1. Observation.  2. Have serial electrocardiograms and deceased.  4. Then follow-up with cardiology consultation, probably with a stress test. Dictated by:   Kari Baars, M.D. Attending Physician:  Annamarie Dawley DD:  06/22/01 TD:  06/23/01 Job: [id number]

## 2010-08-01 NOTE — Procedures (Signed)
NAMECARLIE, [name] NO.:  [id number]   MEDICAL RECORD NO.:  [id number]          PATIENT TYPE:  OUT   LOCATION:  RAD                           FACILITY:  [facility]   PHYSICIAN:  Edward L. Juanetta Gosling, M.D.DATE OF BIRTH:  1916/03/13   DATE OF PROCEDURE:  DATE OF DISCHARGE:  07/03/2004                              PULMONARY FUNCTION TEST   RESULTS:  1.  Spirometry is normal.  2.  Lung volumes are normal.  3.  DLCO is moderately reduced.  4.  Arterial blood gases are normal.      ELH/MEDQ  D:  07/05/2004  T:  07/05/2004  Job:  [id number]

## 2010-08-01 NOTE — H&P (Signed)
[name], [name]                ACCOUNT NO.:  [id number]   MEDICAL RECORD NO.:  [id number]          PATIENT TYPE:  AMB   LOCATION:  DAY                           FACILITY:  [facility]   PHYSICIAN:  Lionel December, M.D.    DATE OF BIRTH:  12-26-1915   DATE OF ADMISSION:  08/03/2005  DATE OF DISCHARGE:  LH                                HISTORY & PHYSICAL   CHIEF COMPLAINT:  Recurrent hoarseness and raspy voice.  Heartburn not  responding to therapy.   HISTORY OF PRESENT ILLNESS:  [name] is 77 year old, Caucasian female  patient of Dr. Juanetta [name] who is self-referred for evaluation.  She is well-  known to me from previous evaluation.  Her last encounter was in April 2003,  when she was hospitalized for biliary pancreatitis and underwent ERCP.   She has had heartburn for several years.  She has tried all available PPIs.  She had been on Prevacid once a day.  She has recurrent heartburn.  She may  go few days without an episode and then she may have heartburn for several  days.  She also has noted hoarseness and raspy voice.  She sings at church  and is having difficulty doing that.  She has been on Prevacid twice a day  for 1 month, but cannot tell any difference.  She also has tried Zegerid in  the past and she recalls that it only help for a short while.  She denies  dysphagia, nausea, vomiting.  She has occasional upper and mid abdominal  pain.  She has irregular bowel habits which has been her pattern for years.  She has been diagnosed with IBS in the past.  She has occasional  hematochezia when she has loose stools.  She states heartburn is worse after  evening meals.  She has a good appetite and has not lost any weight  recently.  She states she has been treated for three episodes of  cold/sinusitis this Winter, but finally she feels she has recovered from it  completely.   MEDICATIONS:  1.  Tarka 4/240 CR daily.  2.  Simvastatin 20 mg daily.  3.  Amitriptyline 25 mg daily.  4.   Lasix 40 mg q.a.m.  5.  Prevacid 30 mg b.i.d.  6.  MVI daily.  7.  Calcium with Vitamin D 500 mg daily.  8.  Tylenol 1 g t.i.d.  9.  Ibuprofen 400 mg daily.   PAST MEDICAL HISTORY:  1.  Hypertension.  2.  Hyperlipidemia.  3.  Chronic GERD, as above.  4.  IBS.  5.  EGD with ED back in January 1992.  She had distal esophageal ring and a      small sliding hiatal hernia.  6.  Her esophagus was redilated in February 1994, at which time she also had      a flexible sigmoidoscopy.  Once again, she had distal esophageal ring      and a small sliding hiatal hernia.  7.  Her most recent EGD was in October 1996, when her esophagus was dilated  again.  She had erosive reflux esophagitis with distal esophageal ring      and small sliding hiatal hernia.  8.  History of heart murmur.   PAST SURGICAL HISTORY:  1.  Status post cholecystectomy in 1990, but presented with biliary      pancreatitis in April 2003.  2.  ERCP with sphincterotomy.  She had very dilated biliary system with      papillary stenosis, but no evidence of choledocholithiasis.  She has not      had any problems since that procedure.  3.  Hysterectomy in 1972.  She still has ovaries.  4.  Neck surgery for disc disease back in 1976.  5.  Low back surgery in 1999 and 1994.   ALLERGIES:  No known drug allergies.   FAMILY HISTORY:  Mother died of colon carcinoma at age 75.  Father died of  heart disease at age 63.  She lost a sister of leukemia.  She has one sister  living who is 41 and doing fairly well.  She ended up with colonic  perforation for high-risk screening colonoscopy and therefore, the patient  decided not to have screening evaluation.  She did have PE several years and  a sigmoidoscopy as well.  She has a brother, age 75, who is in fair health.   SOCIAL HISTORY:  She is widow.  She has one son living.  Another son died of  Hodgkin's disease at age 67.  She is retired.  She works as a Banker x30  years.  She has never smoked cigarettes, does not drink  alcohol.   PHYSICAL EXAMINATION:  GENERAL:  A pleasant, mildly obese, Caucasian female  who is in no acute distress.  VITAL SIGNS:  She weighs 174.5 pounds.  She is 5 feet 2 inches tall.  Pulse  72 per minute, blood pressure 128/72, temperature is 98.2.  HEENT:  Conjunctivae is pink.  Sclera is nonicteric.  Oral pharyngeal mucosa  is normal.  She has a few of her teeth in lower jaw and a partial upper  plate.  NECK:  No neck masses or thyromegaly noted.  CARDIAC:  Regular rhythm.  Normal S1, S2.  She has a grade 2/6 systolic  ejection murmur which is best heard at aortic area radiating to a space of  neck.  LUNGS:  Clear to auscultation.  ABDOMEN:  Full bowel sounds are normal on palpation, soft with mild,  midepigastric tenderness, no organomegaly or masses noted.  RECTAL:  Rectal examination deferred.  EXTREMITIES:  She does not have peripheral edema or clubbing.   ASSESSMENT:  1.  [name] is a 77 year old, Caucasian female with several year history of      gastroesophageal reflux disease who was been on chronic acid suppression      who is not responding to therapy anymore.  She has been on Prevacid      b.i.d. for one month.  She is watching her diet very closely, but      without significant relief of her symptoms.  Her last      esophagogastroduodenoscopy was in December 23, 1994.  She does not have      any alarm symptoms.  She is on low-dose ibuprofen and therefore could      have peptic ulcer disease.  Similarly, Barrett's esophagus or large      hiatal hernia needs to be ruled out.  2.  Family history positive for colon carcinoma, however, mother  developed      colorectal cancer at age 46, and this may have been random occurrence      rather than genetic.  In any case, the patient is not interested in      screening colonoscopy or other studies.   PLAN:  1.  Hemoccult x1. 2.  Discontinue Prevacid.  Start Zegerid 40  mg before evening meals, samples      given.  3.  Diagnostic esophagogastroduodenoscopy to be performed at a [facility].  4.  She will need SBE prophylaxis just in case therapeutic intervention      indicated.   I have reviewed the procedure risks with the patient and she is agreeable.      Lionel December, M.D.  Electronically Signed     NR/MEDQ  D:  08/03/2005  T:  08/03/2005  Job:  [id number]   cc:   Ramon Dredge L. [name], M.D.  Fax: [phone]

## 2010-08-14 ENCOUNTER — Ambulatory Visit (INDEPENDENT_AMBULATORY_CARE_PROVIDER_SITE_OTHER): Payer: Medicare Other | Admitting: *Deleted

## 2010-08-14 DIAGNOSIS — I4891 Unspecified atrial fibrillation: Secondary | ICD-10-CM

## 2010-08-14 DIAGNOSIS — Z7901 Long term (current) use of anticoagulants: Secondary | ICD-10-CM

## 2010-08-14 LAB — POCT INR: INR: 3.1

## 2010-08-18 ENCOUNTER — Other Ambulatory Visit (HOSPITAL_COMMUNITY): Payer: Self-pay | Admitting: Oncology

## 2010-08-18 ENCOUNTER — Encounter (HOSPITAL_COMMUNITY): Payer: Medicare Other | Attending: Oncology

## 2010-08-18 DIAGNOSIS — D509 Iron deficiency anemia, unspecified: Secondary | ICD-10-CM | POA: Insufficient documentation

## 2010-08-18 DIAGNOSIS — N289 Disorder of kidney and ureter, unspecified: Secondary | ICD-10-CM

## 2010-08-18 DIAGNOSIS — C9110 Chronic lymphocytic leukemia of B-cell type not having achieved remission: Secondary | ICD-10-CM | POA: Insufficient documentation

## 2010-08-18 DIAGNOSIS — D638 Anemia in other chronic diseases classified elsewhere: Secondary | ICD-10-CM

## 2010-08-18 LAB — CBC
Hemoglobin: 10.8 g/dL — ABNORMAL LOW (ref 12.0–15.0)
MCHC: 31.6 g/dL (ref 30.0–36.0)
RDW: 13.8 % (ref 11.5–15.5)
WBC: 16 10*3/uL — ABNORMAL HIGH (ref 4.0–10.5)

## 2010-08-19 ENCOUNTER — Encounter: Payer: Self-pay | Admitting: Internal Medicine

## 2010-08-19 ENCOUNTER — Ambulatory Visit (INDEPENDENT_AMBULATORY_CARE_PROVIDER_SITE_OTHER): Payer: Medicare Other | Admitting: Internal Medicine

## 2010-08-19 DIAGNOSIS — I495 Sick sinus syndrome: Secondary | ICD-10-CM

## 2010-08-19 DIAGNOSIS — I4891 Unspecified atrial fibrillation: Secondary | ICD-10-CM

## 2010-08-19 DIAGNOSIS — I10 Essential (primary) hypertension: Secondary | ICD-10-CM

## 2010-08-19 DIAGNOSIS — Z950 Presence of cardiac pacemaker: Secondary | ICD-10-CM

## 2010-08-19 NOTE — Assessment & Plan Note (Signed)
Her blood pressure is well controlled. She will continue her current medication. I've asked her to maintain a low-sodium diet.

## 2010-08-19 NOTE — Assessment & Plan Note (Signed)
Her device is working normally. Will recheck in several months. 

## 2010-08-19 NOTE — Patient Instructions (Signed)
Your physician recommends that you schedule a follow-up appointment in: 6 months  [name] 1 year Dr. Ladona Ridgel

## 2010-08-19 NOTE — Progress Notes (Signed)
HPI   [name] returns today for followup. She is a pleasant 77 year old woman who looks much younger than her stated age. She has a history of bradycardia and status post pacemaker insertion. Recently, the patient has been bothered by back pain and has undergone injection therapy. She is scheduled for more injection to be carried out in the next 2-3 days. She has held her Coumadin and is taking Lovenox subcutaneously. She denies chest pain, shortness of breath, or peripheral edema. No syncope. Allergies  Allergen Reactions  . Sulfonamide Derivatives     REACTION: GI distress  . Tolterodine Tartrate      Current Outpatient Prescriptions  Medication Sig Dispense Refill  . calcium-vitamin D (OSCAL WITH D) 500-200 MG-UNIT per tablet Take 1 tablet by mouth daily.        [name] enoxaparin (LOVENOX) 120 MG/0.8ML SOLN       . Estrogens, Conjugated (PREMARIN VA) Place 1 application vaginally daily.        [name] lisinopril (PRINIVIL,ZESTRIL) 10 MG tablet Take 10 mg by mouth daily.       . metoprolol tartrate (LOPRESSOR) 25 MG tablet TAKE ONE TABLET BY MOUTH TWICE DAILY  60 tablet  5  . Multiple Vitamins-Minerals (MULTIVITAMIN WITH MINERALS) tablet Take 1 tablet by mouth daily.        [name] omeprazole (PRILOSEC) 20 MG capsule Take 20 mg by mouth daily.        . simvastatin (ZOCOR) 20 MG tablet Take 20 mg by mouth at bedtime.        . verapamil (CALAN-SR) 240 MG CR tablet Take 240 mg by mouth at bedtime.       [name] warfarin (COUMADIN) 5 MG tablet Take by mouth as directed.        [name] DISCONTD: azithromycin (ZITHROMAX) 250 MG tablet Take 2 tablets by mouth on day 1, followed by 1 tablet by mouth daily for 4 days.      [name] DISCONTD: nystatin-triamcinolone (MYCOLOG II) cream       . DISCONTD: PREMARIN vaginal cream Place 0.5 g vaginally daily.       [name] DISCONTD: SANCTURA XR 60 MG CP24          Past Medical History  Diagnosis Date  . Sick sinus syndrome 11/2006     with paroxysmal atrial fibrillation; dual-chamber  pacemaker in 11/2006  . Aortic stenosis     mild  . Left bundle branch block   . Congestive heart failure     Congestive heart failure with normal ejection fraction  . ASCVD (arteriosclerotic cardiovascular disease)     coronary angio in 10/09:50% left anterior descending; 80% PDA; medical therapy advisedOrthostatic decrease in blood pressure  . Epistaxis     mild with negative ENT evaluation  . RENAL DISEASE,      Creatinine of 1.7 in 8/08, 1.4 in 10/08 and 1.5 in 11/09  . Dizziness     chronic  . Osteoarthritis   . Irritable bowel syndrome      adenoma of the small bowel  . Anemia     CLL with anemia H./H. of 9/27 in 11/09 with normal MCV; iron deficiency in bone marrow  . Urinary frequency     with incontinence  . Gastroesophageal reflux disease   . Rosacea   . Asthma     Childhood asthma    ROS:   All systems reviewed and negative except as noted in the HPI.   Past Surgical History  Procedure Date  .  Cholecystectomy   . C-spine ls spine procedure   . Cataract surgery     bilaterial  . Bladder resuspension   . Abdominal hysterectomy 1972  . Dual-chamber pacemake 11/2006    Dual-chamber pacemaker implantation-9/08     No family history on file.   History   Social History  . Marital Status: Widowed    Spouse Name: N/A    Number of Children: N/A  . Years of Education: N/A   Occupational History  . retired    Social History Main Topics  . Smoking status: Never Smoker   . Smokeless tobacco: Never Used  . Alcohol Use: No  . Drug Use: No  . Sexually Active: Not on file   Other Topics Concern  . Not on file   Social History Narrative  . No narrative on file     BP 112/58  Pulse 77  Ht 4\' 11"  (1.499 m)  Wt 163 lb (73.936 kg)  BMI 32.92 kg/m2  Physical Exam:  Well appearing elderly woman,NAD HEENT: Unremarkable Neck:  No JVD, no thyromegally Lymphatics:  No adenopathy Back:  No CVA tenderness Lungs:  Clear. Well-healed pacemaker  incision. HEART:  Regular rate rhythm, no murmurs, no rubs, no clicks Abd:  Flat, positive bowel sounds, no organomegally, no rebound, no guarding Ext:  2 plus pulses, no edema, no cyanosis, no clubbing Skin:  No rashes no nodules Neuro:  CN II through XII intact, motor grossly intact  DEVICE  Normal device function.  See PaceArt for details.   Assess/Plan:

## 2010-08-19 NOTE — Assessment & Plan Note (Signed)
She denies palpitations. She is maintaining sinus rhythm rate of 99.9% of the time. She will continue her current medical therapy.

## 2010-08-20 ENCOUNTER — Other Ambulatory Visit: Payer: Self-pay

## 2010-08-20 MED ORDER — LISINOPRIL 10 MG PO TABS: 10.0000 mg | ORAL_TABLET | Freq: Every day | ORAL | Status: DC

## 2010-08-21 ENCOUNTER — Ambulatory Visit (INDEPENDENT_AMBULATORY_CARE_PROVIDER_SITE_OTHER): Payer: Medicare Other | Admitting: *Deleted

## 2010-08-21 DIAGNOSIS — I4891 Unspecified atrial fibrillation: Secondary | ICD-10-CM

## 2010-08-21 DIAGNOSIS — Z7901 Long term (current) use of anticoagulants: Secondary | ICD-10-CM

## 2010-08-21 LAB — POCT INR: INR: 1.2

## 2010-08-21 MED ORDER — LISINOPRIL 10 MG PO TABS: 10.0000 mg | ORAL_TABLET | Freq: Every day | ORAL | Status: DC

## 2010-08-21 MED ORDER — WARFARIN SODIUM 2.5 MG PO TABS: 2.5000 mg | ORAL_TABLET | ORAL | Status: DC

## 2010-08-25 ENCOUNTER — Ambulatory Visit (INDEPENDENT_AMBULATORY_CARE_PROVIDER_SITE_OTHER): Payer: Medicare Other | Admitting: *Deleted

## 2010-08-25 DIAGNOSIS — I4891 Unspecified atrial fibrillation: Secondary | ICD-10-CM

## 2010-08-25 DIAGNOSIS — Z7901 Long term (current) use of anticoagulants: Secondary | ICD-10-CM

## 2010-08-25 LAB — POCT INR: INR: 1.6

## 2010-08-26 ENCOUNTER — Encounter (HOSPITAL_COMMUNITY): Payer: Self-pay | Admitting: *Deleted

## 2010-08-27 ENCOUNTER — Ambulatory Visit (INDEPENDENT_AMBULATORY_CARE_PROVIDER_SITE_OTHER): Payer: Medicare Other | Admitting: *Deleted

## 2010-08-27 DIAGNOSIS — Z7901 Long term (current) use of anticoagulants: Secondary | ICD-10-CM

## 2010-08-27 DIAGNOSIS — I4891 Unspecified atrial fibrillation: Secondary | ICD-10-CM

## 2010-08-27 LAB — POCT INR: INR: 2.5

## 2010-08-28 ENCOUNTER — Other Ambulatory Visit (HOSPITAL_COMMUNITY): Payer: Self-pay | Admitting: Oncology

## 2010-08-28 ENCOUNTER — Encounter (HOSPITAL_COMMUNITY): Payer: Self-pay | Admitting: Oncology

## 2010-08-28 DIAGNOSIS — C9110 Chronic lymphocytic leukemia of B-cell type not having achieved remission: Secondary | ICD-10-CM

## 2010-08-28 DIAGNOSIS — D649 Anemia, unspecified: Secondary | ICD-10-CM

## 2010-08-28 NOTE — Assessment & Plan Note (Signed)
History of Iron Deficiency Anemia (requiring occasional Feraheme IV infusion) and Anemia of Chronic Disease (on Procrit therapy)

## 2010-08-28 NOTE — Assessment & Plan Note (Signed)
Stage I, not in need of therapy presently

## 2010-09-09 ENCOUNTER — Encounter (HOSPITAL_COMMUNITY): Payer: Medicare Other

## 2010-09-09 DIAGNOSIS — D509 Iron deficiency anemia, unspecified: Secondary | ICD-10-CM

## 2010-09-11 ENCOUNTER — Ambulatory Visit (INDEPENDENT_AMBULATORY_CARE_PROVIDER_SITE_OTHER): Payer: Medicare Other | Admitting: *Deleted

## 2010-09-11 DIAGNOSIS — I4891 Unspecified atrial fibrillation: Secondary | ICD-10-CM

## 2010-09-11 DIAGNOSIS — Z7901 Long term (current) use of anticoagulants: Secondary | ICD-10-CM

## 2010-09-18 ENCOUNTER — Other Ambulatory Visit: Payer: Self-pay | Admitting: Cardiology

## 2010-09-18 MED ORDER — VERAPAMIL HCL ER 240 MG PO TBCR: 240.0000 mg | EXTENDED_RELEASE_TABLET | Freq: Every day | ORAL | Status: DC

## 2010-09-18 MED ORDER — LISINOPRIL 10 MG PO TABS: 10.0000 mg | ORAL_TABLET | Freq: Every day | ORAL | Status: DC

## 2010-09-18 NOTE — Telephone Encounter (Signed)
.   Requested Prescriptions   Pending Prescriptions Disp Refills  . lisinopril (PRINIVIL,ZESTRIL) 10 MG tablet 30 tablet 1    Sig: Take 1 tablet (10 mg total) by mouth daily.  . verapamil (CALAN-SR) 240 MG CR tablet 30 tablet 1    Sig: Take 1 tablet (240 mg total) by mouth at bedtime.

## 2010-09-22 ENCOUNTER — Encounter (HOSPITAL_COMMUNITY): Payer: Self-pay | Admitting: Oncology

## 2010-09-22 ENCOUNTER — Encounter (HOSPITAL_COMMUNITY): Payer: Medicare Other | Attending: Oncology

## 2010-09-22 ENCOUNTER — Other Ambulatory Visit (HOSPITAL_COMMUNITY): Payer: Self-pay | Admitting: Oncology

## 2010-09-22 DIAGNOSIS — D638 Anemia in other chronic diseases classified elsewhere: Secondary | ICD-10-CM

## 2010-09-22 DIAGNOSIS — N289 Disorder of kidney and ureter, unspecified: Secondary | ICD-10-CM

## 2010-09-22 DIAGNOSIS — D630 Anemia in neoplastic disease: Secondary | ICD-10-CM | POA: Insufficient documentation

## 2010-09-22 LAB — CBC
Platelets: 180 10*3/uL (ref 150–400)
RBC: 3.7 MIL/uL — ABNORMAL LOW (ref 3.87–5.11)
WBC: 14.9 10*3/uL — ABNORMAL HIGH (ref 4.0–10.5)

## 2010-09-22 LAB — DIFFERENTIAL
Eosinophils Relative: 2 % (ref 0–5)
Lymphocytes Relative: 64 % — ABNORMAL HIGH (ref 12–46)
Monocytes Relative: 4 % (ref 3–12)
Neutrophils Relative %: 30 % — ABNORMAL LOW (ref 43–77)

## 2010-09-22 MED ORDER — EPOETIN ALFA 40000 UNIT/ML IJ SOLN
INTRAMUSCULAR | Status: AC
  Administered 2010-09-22: 40000 [IU] via SUBCUTANEOUS
  Filled 2010-09-22: qty 1

## 2010-09-22 NOTE — Progress Notes (Signed)
[name] presents today for injection per MD orders. Procrit [id number] administered SQ in left Abdomen. Administration without incident. Patient tolerated well.

## 2010-09-25 ENCOUNTER — Ambulatory Visit (INDEPENDENT_AMBULATORY_CARE_PROVIDER_SITE_OTHER): Payer: Medicare Other | Admitting: Otolaryngology

## 2010-10-02 ENCOUNTER — Encounter: Payer: Medicare Other | Admitting: *Deleted

## 2010-10-03 ENCOUNTER — Ambulatory Visit (INDEPENDENT_AMBULATORY_CARE_PROVIDER_SITE_OTHER): Payer: Medicare Other | Admitting: *Deleted

## 2010-10-03 ENCOUNTER — Ambulatory Visit (INDEPENDENT_AMBULATORY_CARE_PROVIDER_SITE_OTHER): Payer: Medicare Other | Admitting: Adult Health

## 2010-10-03 ENCOUNTER — Encounter: Payer: Self-pay | Admitting: Adult Health

## 2010-10-03 DIAGNOSIS — I4891 Unspecified atrial fibrillation: Secondary | ICD-10-CM

## 2010-10-03 DIAGNOSIS — I2510 Atherosclerotic heart disease of native coronary artery without angina pectoris: Secondary | ICD-10-CM

## 2010-10-03 DIAGNOSIS — Z7901 Long term (current) use of anticoagulants: Secondary | ICD-10-CM

## 2010-10-03 DIAGNOSIS — I495 Sick sinus syndrome: Secondary | ICD-10-CM

## 2010-10-03 DIAGNOSIS — R42 Dizziness and giddiness: Secondary | ICD-10-CM

## 2010-10-03 DIAGNOSIS — I10 Essential (primary) hypertension: Secondary | ICD-10-CM

## 2010-10-03 MED ORDER — VERAPAMIL HCL ER 180 MG PO TBCR: 180.0000 mg | EXTENDED_RELEASE_TABLET | Freq: Every day | ORAL | Status: DC

## 2010-10-03 NOTE — Assessment & Plan Note (Signed)
Secondary to dizziness and orthostasis noted on BP's in exam room, I will decrease verapamil to 180mg  daily from 240 mg daily. She will follow-up in 2 weeks for BP check.  Otherwise no other changes.

## 2010-10-03 NOTE — Assessment & Plan Note (Signed)
She appears to be doing well concerning cardiac symptoms.   She is only taking lasix as needed for LEE. She is followed by Dr. Juanetta Gosling for labs and is due for more labs in August. She has not needed to take any NTG. No new testing at this time.

## 2010-10-03 NOTE — Patient Instructions (Signed)
Your physician recommends that you schedule a follow-up appointment in: 6 MONTHS Your physician has recommended you make the following change in your medication: DECREASE VERAPAMIL 180MG  DAILY NURSE VISIT IN 2 WEEKS

## 2010-10-03 NOTE — Progress Notes (Signed)
HPI: [name] is a pleasant 77 y/o patient of Dr. Dietrich Pates with known history of  SSS s/p pacemaker, mild to moderate aortic stenosis,, CHF with normal LV fx, CAD who is here for annual follow-up.  She has had a lot of musculoskeletal complaints over the last year to include hip problems.  She had had no complaints of chest pain, but has had some dizziness and light-headedness.  She has felt more tired lately and has noticed that her BP has been lower than usual.     Allergies  Allergen Reactions  . Sanctura (Trospium Chloride) Nausea Only and Other (See Comments)    Intense lower abd pain  . Sulfa Antibiotics Other (See Comments)    Extreme dizziness  . Sulfonamide Derivatives     REACTION: GI distress  . Tolterodine Tartrate     Current Outpatient Prescriptions  Medication Sig Dispense Refill  . acetaminophen (TYLENOL) 500 MG tablet Take 1,000 mg by mouth 4 (four) times daily.        . calcium-vitamin D (OSCAL WITH D) 500-200 MG-UNIT per tablet Take 1 tablet by mouth daily.        [name] epoetin alfa (PROCRIT) 16109 UNIT/ML injection Inject into the skin.        . Estrogens, Conjugated (PREMARIN VA) Place 1 application vaginally daily.       . furosemide (LASIX) 20 MG tablet Take 20 mg by mouth as needed.        [name] lisinopril (PRINIVIL,ZESTRIL) 10 MG tablet Take 1 tablet (10 mg total) by mouth daily.  30 tablet  1  . metoprolol tartrate (LOPRESSOR) 25 MG tablet TAKE ONE TABLET BY MOUTH TWICE DAILY  60 tablet  5  . Multiple Vitamins-Minerals (MULTIVITAMIN WITH MINERALS) tablet Take 1 tablet by mouth daily.        [name] omeprazole (PRILOSEC) 20 MG capsule Take 20 mg by mouth daily.        . simvastatin (ZOCOR) 20 MG tablet Take 20 mg by mouth at bedtime.        . verapamil (CALAN-SR) 180 MG CR tablet Take 1 tablet (180 mg total) by mouth at bedtime.  30 tablet  6  . warfarin (COUMADIN) 2.5 MG tablet Take 1 tablet (2.5 mg total) by mouth as directed.  30 tablet  3  . DISCONTD: verapamil (CALAN-SR)  240 MG CR tablet Take 1 tablet (240 mg total) by mouth at bedtime.  30 tablet  1    Past Medical History  Diagnosis Date  . Sick sinus syndrome 11/2006     with paroxysmal atrial fibrillation; dual-chamber pacemaker in 11/2006  . Aortic stenosis     mild  . Left bundle branch block   . Congestive heart failure     Congestive heart failure with normal ejection fraction  . ASCVD (arteriosclerotic cardiovascular disease)     coronary angio in 10/09:50% left anterior descending; 80% PDA; medical therapy advisedOrthostatic decrease in blood pressure  . Epistaxis     mild with negative ENT evaluation  . RENAL DISEASE,      Creatinine of 1.7 in 8/08, 1.4 in 10/08 and 1.5 in 11/09  . Dizziness     chronic  . Osteoarthritis   . Irritable bowel syndrome      adenoma of the small bowel  . Anemia     CLL with anemia H./H. of 9/27 in 11/09 with normal MCV; iron deficiency in bone marrow  . Urinary frequency     with incontinence  .  Gastroesophageal reflux disease   . Rosacea   . Asthma     Childhood asthma  . Leukemia     CLL, stage I   . Anemia of chronic disease 08/28/2010  . Renal insufficiency 09/22/2010    Past Surgical History  Procedure Date  . Cholecystectomy   . C-spine ls spine procedure   . Cataract surgery     bilaterial  . Bladder resuspension   . Abdominal hysterectomy 1972  . Dual-chamber pacemake 11/2006    Dual-chamber pacemaker implantation-9/08    BJY:NWGNFA of systems complete and found to be negative unless listed above PHYSICAL EXAM BP 135/69  Pulse 64  Ht 5\' 2"  (1.575 m)  Wt 168 lb (76.204 kg)  BMI 30.73 kg/m2  SpO2 91% General: Well developed, well nourished, in no acute distress Head: Eyes PERRLA, No xanthomas.   Normal cephalic and atramatic  Lungs: Clear bilaterally to auscultation and percussion. Heart: HRRR S1 S2,  1/6 systolic murmur at the RSB.  Pulses are 2+ & equal.            No carotid bruit. No JVD.  No abdominal bruits. No femoral  bruits. Abdomen: Bowel sounds are positive, abdomen soft and non-tender without masses or                  Hernia's noted. Msk:  Back normal, normal gait. Normal strength and tone for age. Extremities: No clubbing, cyanosis or edema.  DP +1 Neuro: Alert and oriented X 3. Psych:  Good affect, responds appropriately  OZH:YQMVH rhythm rate of 73 bpm  ASSESSMENT AND PLAN

## 2010-10-03 NOTE — Patient Instructions (Signed)
SEE ANTICOAGULATION SHEET

## 2010-10-03 NOTE — Assessment & Plan Note (Signed)
She has seen Dr. Ladona Ridgel within the last month. She is to continue with him as scheduled.

## 2010-10-06 ENCOUNTER — Other Ambulatory Visit: Payer: Self-pay | Admitting: *Deleted

## 2010-10-06 MED ORDER — FUROSEMIDE 20 MG PO TABS: 20.0000 mg | ORAL_TABLET | ORAL | Status: DC | PRN

## 2010-10-13 ENCOUNTER — Encounter (HOSPITAL_BASED_OUTPATIENT_CLINIC_OR_DEPARTMENT_OTHER): Payer: Medicare Other

## 2010-10-13 DIAGNOSIS — D638 Anemia in other chronic diseases classified elsewhere: Secondary | ICD-10-CM

## 2010-10-13 DIAGNOSIS — C9100 Acute lymphoblastic leukemia not having achieved remission: Secondary | ICD-10-CM

## 2010-10-13 DIAGNOSIS — N289 Disorder of kidney and ureter, unspecified: Secondary | ICD-10-CM

## 2010-10-13 LAB — CBC
MCV: 101 fL — ABNORMAL HIGH (ref 78.0–100.0)
Platelets: 165 10*3/uL (ref 150–400)
RBC: 4.06 MIL/uL (ref 3.87–5.11)
WBC: 13.7 10*3/uL — ABNORMAL HIGH (ref 4.0–10.5)

## 2010-10-13 LAB — DIFFERENTIAL
Eosinophils Relative: 2 % (ref 0–5)
Lymphocytes Relative: 60 % — ABNORMAL HIGH (ref 12–46)
Lymphs Abs: 8.3 10*3/uL — ABNORMAL HIGH (ref 0.7–4.0)

## 2010-10-13 MED ORDER — EPOETIN ALFA 40000 UNIT/ML IJ SOLN
40000.0000 [IU] | Freq: Once | INTRAMUSCULAR | Status: AC
  Administered 2010-10-13: 40000 [IU] via SUBCUTANEOUS

## 2010-10-13 MED ORDER — EPOETIN ALFA 40000 UNIT/ML IJ SOLN
INTRAMUSCULAR | Status: AC
  Filled 2010-10-13: qty 1

## 2010-10-13 NOTE — Progress Notes (Signed)
Labs ordered today for cbc/diff

## 2010-10-14 ENCOUNTER — Ambulatory Visit (HOSPITAL_COMMUNITY): Payer: Medicare Other | Admitting: Oncology

## 2010-10-17 ENCOUNTER — Ambulatory Visit (INDEPENDENT_AMBULATORY_CARE_PROVIDER_SITE_OTHER): Payer: Medicare Other

## 2010-10-17 VITALS — Ht 59.0 in | Wt 169.0 lb

## 2010-10-17 DIAGNOSIS — I10 Essential (primary) hypertension: Secondary | ICD-10-CM

## 2010-10-17 MED ORDER — METOPROLOL TARTRATE 25 MG PO TABS: 25.0000 mg | ORAL_TABLET | Freq: Two times a day (BID) | ORAL | Status: DC

## 2010-10-17 MED ORDER — LISINOPRIL 10 MG PO TABS: 10.0000 mg | ORAL_TABLET | Freq: Every day | ORAL | Status: DC

## 2010-10-17 NOTE — Progress Notes (Signed)
**Note De-Identified [name] Obfuscation** S: Pt. Arrives in office for a 2 week BP check with nurse B: On last OV with Joni Reining, NP on 10-03-10 A: Pt. has no cardiac complaints at this time. She brought in her medications (she is taking as directed) and BP diary (faxed to [facility] to be scanned into chart). On last OV BP was 135/69 and today BP is 162/72.  R: Pt. advised that we will contact her with Harriet Pho, NP's recommendations, if any.

## 2010-10-17 NOTE — Progress Notes (Signed)
No changes Per Joni Reining, NP. Pt. Aware./LV

## 2010-11-03 ENCOUNTER — Encounter (HOSPITAL_COMMUNITY): Payer: Self-pay | Admitting: Oncology

## 2010-11-03 ENCOUNTER — Encounter (HOSPITAL_COMMUNITY): Payer: Medicare Other | Attending: Oncology | Admitting: Oncology

## 2010-11-03 ENCOUNTER — Ambulatory Visit (INDEPENDENT_AMBULATORY_CARE_PROVIDER_SITE_OTHER): Payer: Medicare Other | Admitting: *Deleted

## 2010-11-03 DIAGNOSIS — I4891 Unspecified atrial fibrillation: Secondary | ICD-10-CM

## 2010-11-03 DIAGNOSIS — Z7901 Long term (current) use of anticoagulants: Secondary | ICD-10-CM

## 2010-11-03 DIAGNOSIS — D509 Iron deficiency anemia, unspecified: Secondary | ICD-10-CM | POA: Insufficient documentation

## 2010-11-03 DIAGNOSIS — D649 Anemia, unspecified: Secondary | ICD-10-CM

## 2010-11-03 DIAGNOSIS — C9110 Chronic lymphocytic leukemia of B-cell type not having achieved remission: Secondary | ICD-10-CM

## 2010-11-03 DIAGNOSIS — N289 Disorder of kidney and ureter, unspecified: Secondary | ICD-10-CM

## 2010-11-03 LAB — POCT INR: INR: 3.2

## 2010-11-03 NOTE — Progress Notes (Signed)
CC:   Gerrit Friends. Dietrich Pates, MD, [facility]  DIAGNOSIS: 1. Anemia of chronic disease. 2. Mild renal insufficiency. 3. Chronic lymphocytic leukemia, stage I, still not in need of     therapy. 4. Atrial fibrillation status post pacemaker insertion 11/18/2006, on     Coumadin, by Dr. Donnamarie Rossetti. 5. Left hip discomfort for which she is seeing Dr. Aldean Baker, status     post 2 steroid injections. 6. Mild shortness of breath. 7. Asthma as a child that was severe. 8. Cervical spine fusion in the past. 9. Hysterectomy in 1972 for benign disease. 10.History of hypertension. 11.Cholecystectomy in the past.  [name] looks great.  She has responded very, very well to the Procrit, in fact too well.  Unfortunately, with a conversion of the computer system, we did not get her Procrit orders changed to every 5 weeks, so in June when we ordered the change, she did not get that taken care of. So we will change it now to every 5 weeks as long as her hemoglobin is less than 11.4 g essentially.  She does much better when her hemoglobin is over 11.  She feels very good.  She has a good appetite, weight is up a few pounds compared to when I first met her years ago.  Her white count is stable.  Platelets are stable.  She has no B symptomatology.  She is not aware of any lumps anywhere. She has no vomiting.  She feels a little queasy in her stomach when she is hungry, and she gets hungry quite often.  Her sense of well-being is quite good overall.  PHYSICAL EXAMINATION:  Vital Signs:  She has a weight of 169 pounds, blood pressure 151/70 and her pulse is 88 and it is occasionally regular.  Her temperature is normal.  General:  She has complaints of left hip pain only at this time.  She cannot lie on her left side when she sleeps at night.  Heart:  Shows a pretty regular rhythm.  There is a very, very soft grade 1/6 systolic murmur that is very soft.  I did not hear a diastolic component.  Breast Exam:   Tender in the right breast but no masses.  She has a fungal-like infection underneath the right breast for she has seen Dr. Charlton Haws, and there is some cream over this area that she states she was given.  She does not remember the name of it, however. Lungs:  Clear to auscultation and percussion.  Lymphatic: She has no adenopathy in the cervical, supraclavicular, infraclavicular, axillary or inguinal areas.  Abdomen:  She has no hepatosplenomegaly. Bowel sounds are normal.  I think overall she looks great.  We will continue to see her back, but we will hold the Procrit, at least change it to every 35 days and hold it if she is greater than 11.4.  We will see her back sooner if need be. Otherwise, I will see her in 6 months.    ______________________________ Ladona Horns. Mariel Sleet, MD ESN/MEDQ  D:  11/03/2010  T:  11/03/2010  Job:  [id number]

## 2010-11-03 NOTE — Progress Notes (Signed)
This office note has been dictated.

## 2010-11-03 NOTE — Patient Instructions (Signed)
[facility]  Discharge Instructions  RECOMMENDATIONS MADE BY THE CONSULTANT AND ANY TEST RESULTS WILL BE SENT TO YOUR REFERRING DOCTOR.        SPECIAL INSTRUCTIONS/FOLLOW-UP: You look good and are doing well. Return to clinic as scheduled. INJECTIONS EVERY 5 WEEKS. Call if you have any Kyrianna Barletta issues or concerns.   I acknowledge that I have been informed and understand all the instructions given to me and received a copy. I do not have any more questions at this time, but understand that I may call the Specialty Clinic at [facility] at [phone] during business hours should I have any further questions or need assistance in obtaining follow-up care.    __________________________________________  _____________  __________ Signature of Patient or Authorized Representative            Date                   Time    __________________________________________ Nurse's Signature

## 2010-11-10 ENCOUNTER — Ambulatory Visit (HOSPITAL_COMMUNITY): Payer: Medicare Other

## 2010-11-19 ENCOUNTER — Encounter (HOSPITAL_COMMUNITY): Payer: Medicare Other | Attending: Oncology

## 2010-11-19 ENCOUNTER — Ambulatory Visit (INDEPENDENT_AMBULATORY_CARE_PROVIDER_SITE_OTHER): Payer: Medicare Other | Admitting: *Deleted

## 2010-11-19 DIAGNOSIS — D509 Iron deficiency anemia, unspecified: Secondary | ICD-10-CM | POA: Insufficient documentation

## 2010-11-19 DIAGNOSIS — Z7901 Long term (current) use of anticoagulants: Secondary | ICD-10-CM

## 2010-11-19 DIAGNOSIS — D630 Anemia in neoplastic disease: Secondary | ICD-10-CM

## 2010-11-19 DIAGNOSIS — C9110 Chronic lymphocytic leukemia of B-cell type not having achieved remission: Secondary | ICD-10-CM | POA: Insufficient documentation

## 2010-11-19 DIAGNOSIS — C9100 Acute lymphoblastic leukemia not having achieved remission: Secondary | ICD-10-CM

## 2010-11-19 DIAGNOSIS — I4891 Unspecified atrial fibrillation: Secondary | ICD-10-CM

## 2010-11-19 DIAGNOSIS — N289 Disorder of kidney and ureter, unspecified: Secondary | ICD-10-CM

## 2010-11-19 LAB — CBC
HCT: 40.1 % (ref 36.0–46.0)
Hemoglobin: 13 g/dL (ref 12.0–15.0)
MCHC: 32.4 g/dL (ref 30.0–36.0)
MCV: 99.5 fL (ref 78.0–100.0)
RDW: 13.5 % (ref 11.5–15.5)
WBC: 19.6 10*3/uL — ABNORMAL HIGH (ref 4.0–10.5)

## 2010-11-19 LAB — DIFFERENTIAL
Eosinophils Relative: 2 % (ref 0–5)
Lymphocytes Relative: 65 % — ABNORMAL HIGH (ref 12–46)
Monocytes Absolute: 0.7 10*3/uL (ref 0.1–1.0)
Monocytes Relative: 4 % (ref 3–12)
Neutro Abs: 5.8 10*3/uL (ref 1.7–7.7)

## 2010-11-19 MED ORDER — EPOETIN ALFA 40000 UNIT/ML IJ SOLN
40000.0000 [IU] | Freq: Once | INTRAMUSCULAR | Status: AC
  Administered 2010-11-19: 40000 [IU] via SUBCUTANEOUS

## 2010-11-19 MED ORDER — EPOETIN ALFA 40000 UNIT/ML IJ SOLN
INTRAMUSCULAR | Status: AC
  Administered 2010-11-19: 40000 [IU] via SUBCUTANEOUS
  Filled 2010-11-19: qty 1

## 2010-11-19 NOTE — Progress Notes (Signed)
[name] presented for [employer]. Labs per MD order drawn via peripheral stick rt ac.  Procrit 40,000 units subq lt lower abdomen.  Procedure without incident.   Patient tolerated procedure well.

## 2010-12-03 ENCOUNTER — Encounter: Payer: Medicare Other | Admitting: *Deleted

## 2010-12-03 LAB — H. PYLORI ANTIBODY, IGG: H Pylori IgG: <0.4

## 2010-12-04 ENCOUNTER — Ambulatory Visit (INDEPENDENT_AMBULATORY_CARE_PROVIDER_SITE_OTHER): Payer: Medicare Other | Admitting: *Deleted

## 2010-12-04 ENCOUNTER — Encounter: Payer: Medicare Other | Admitting: *Deleted

## 2010-12-04 DIAGNOSIS — I4891 Unspecified atrial fibrillation: Secondary | ICD-10-CM

## 2010-12-04 DIAGNOSIS — Z7901 Long term (current) use of anticoagulants: Secondary | ICD-10-CM

## 2010-12-05 LAB — PROTIME-INR: INR: 1

## 2010-12-12 LAB — COMPREHENSIVE METABOLIC PANEL
Alkaline Phosphatase: 92
BUN: 29 — ABNORMAL HIGH
Chloride: 107
GFR calc non Af Amer: 40 — ABNORMAL LOW
Glucose, Bld: 119 — ABNORMAL HIGH
Potassium: 4.6
Total Bilirubin: 0.6

## 2010-12-12 LAB — RETICULOCYTES
RBC.: 3.23 — ABNORMAL LOW
Retic Count, Absolute: 158.3
Retic Ct Pct: 4.9 — ABNORMAL HIGH

## 2010-12-12 LAB — CBC
HCT: 30.7 — ABNORMAL LOW
Hemoglobin: 10.1 — ABNORMAL LOW
WBC: 17.4 — ABNORMAL HIGH

## 2010-12-12 LAB — FERRITIN: Ferritin: 26 (ref 10–291)

## 2010-12-12 LAB — PROTIME-INR: INR: 2.6 — ABNORMAL HIGH

## 2010-12-15 LAB — HAPTOGLOBIN: Haptoglobin: 139

## 2010-12-15 LAB — BASIC METABOLIC PANEL
BUN: 37 — ABNORMAL HIGH
BUN: 27 — ABNORMAL HIGH
BUN: 28 — ABNORMAL HIGH
CO2: 24
CO2: 24
CO2: 25
CO2: 24
CO2: 25
CO2: 27
Calcium: 8.8
Calcium: 8.9
Calcium: 8.9
Calcium: 8 — ABNORMAL LOW
Calcium: 8.4
Chloride: 106
Chloride: 108
Chloride: 111
Chloride: 111
Chloride: 105
Chloride: 105
Chloride: 110
Creatinine, Ser: 1.39 — ABNORMAL HIGH
Creatinine, Ser: 1.63 — ABNORMAL HIGH
Creatinine, Ser: 1.44 — ABNORMAL HIGH
Creatinine, Ser: 1.46 — ABNORMAL HIGH
GFR calc Af Amer: 49 — ABNORMAL LOW
GFR calc Af Amer: 31 — ABNORMAL LOW
GFR calc Af Amer: 33 — ABNORMAL LOW
GFR calc Af Amer: 36 — ABNORMAL LOW
GFR calc Af Amer: 43 — ABNORMAL LOW
GFR calc Af Amer: 37 — ABNORMAL LOW
GFR calc Af Amer: 37 — ABNORMAL LOW
GFR calc non Af Amer: 40 — ABNORMAL LOW
GFR calc non Af Amer: 33 — ABNORMAL LOW
GFR calc non Af Amer: 36 — ABNORMAL LOW
GFR calc non Af Amer: 31 — ABNORMAL LOW
Glucose, Bld: 150 — ABNORMAL HIGH
Glucose, Bld: 107 — ABNORMAL HIGH
Glucose, Bld: 133 — ABNORMAL HIGH
Glucose, Bld: 110 — ABNORMAL HIGH
Glucose, Bld: 89
Glucose, Bld: 98
Potassium: 4.5
Potassium: 4.9
Potassium: 5.2 — ABNORMAL HIGH
Potassium: 5.4 — ABNORMAL HIGH
Potassium: 5.7 — ABNORMAL HIGH
Potassium: 3.6
Potassium: 3.6
Potassium: 4.4
Sodium: 136
Sodium: 139
Sodium: 139
Sodium: 141
Sodium: 134 — ABNORMAL LOW
Sodium: 140

## 2010-12-15 LAB — PROTIME-INR
INR: 1.7 — ABNORMAL HIGH
INR: 2.7 — ABNORMAL HIGH
INR: 3.4 — ABNORMAL HIGH
INR: 1.2
INR: 1.2
INR: 1.3
Prothrombin Time: 20.3 — ABNORMAL HIGH
Prothrombin Time: 17 — ABNORMAL HIGH
Prothrombin Time: 25.4 — ABNORMAL HIGH

## 2010-12-15 LAB — DIFFERENTIAL
Basophils Absolute: 0.1
Eosinophils Absolute: 0.3
Lymphocytes Relative: 46
Lymphocytes Relative: 60 — ABNORMAL HIGH
Lymphs Abs: 11.6 — ABNORMAL HIGH
Lymphs Abs: 9 — ABNORMAL HIGH
Monocytes Absolute: 0.9
Monocytes Relative: 4
Neutro Abs: 12.3 — ABNORMAL HIGH
Neutrophils Relative %: 33 — ABNORMAL LOW

## 2010-12-15 LAB — CBC
HCT: 26.4 — ABNORMAL LOW
Hemoglobin: 9.4 — ABNORMAL LOW
Hemoglobin: 7.5 — CL
Hemoglobin: 8.9 — ABNORMAL LOW
Hemoglobin: 9.3 — ABNORMAL LOW
MCHC: 33.5
MCHC: 33.5
MCV: 93.6
MCV: 92.7
Platelets: 198
Platelets: 202
RBC: 3.03 — ABNORMAL LOW
RBC: 2.67 — ABNORMAL LOW
RBC: 2.41 — ABNORMAL LOW
RBC: 2.86 — ABNORMAL LOW
RBC: 2.99 — ABNORMAL LOW
RDW: 13.8
RDW: 13.9
RDW: 14.5
WBC: 25.2 — ABNORMAL HIGH
WBC: 15 — ABNORMAL HIGH
WBC: 12.6 — ABNORMAL HIGH
WBC: 13.1 — ABNORMAL HIGH
WBC: 13.6 — ABNORMAL HIGH

## 2010-12-15 LAB — IRON AND TIBC
Iron: 55
Saturation Ratios: 13 — ABNORMAL LOW
Saturation Ratios: 15 — ABNORMAL LOW
UIBC: 333
UIBC: 311

## 2010-12-15 LAB — CROSSMATCH
ABO/RH(D): O NEG
Antibody Screen: NEGATIVE

## 2010-12-15 LAB — CARDIAC PANEL(CRET KIN+CKTOT+MB+TROPI)
CK, MB: 2.4
Relative Index: INVALID
Relative Index: INVALID
Total CK: 39

## 2010-12-15 LAB — POCT CARDIAC MARKERS
Myoglobin, poc: 102
Troponin i, poc: <0.05

## 2010-12-15 LAB — FERRITIN
Ferritin: 20 (ref 10–291)
Ferritin: 33 (ref 10–291)

## 2010-12-15 LAB — HEPARIN LEVEL (UNFRACTIONATED): Heparin Unfractionated: >2 — ABNORMAL HIGH

## 2010-12-15 LAB — D-DIMER, QUANTITATIVE: D-Dimer, Quant: 0.26

## 2010-12-15 LAB — PLATELET COUNT: Platelets: 202

## 2010-12-15 LAB — B-NATRIURETIC PEPTIDE (CONVERTED LAB): Pro B Natriuretic peptide (BNP): 147 — ABNORMAL HIGH

## 2010-12-15 LAB — APTT: aPTT: 45 — ABNORMAL HIGH

## 2010-12-15 LAB — LACTATE DEHYDROGENASE: LDH: 118

## 2010-12-16 LAB — RETICULOCYTES
Retic Ct Pct: 1.8
Retic Ct Pct: 2.7

## 2010-12-16 LAB — CBC
HCT: 27 — ABNORMAL LOW
MCHC: 33.6
MCV: 92.1
Platelets: 225
Platelets: 227
RBC: 2.87 — ABNORMAL LOW
RDW: 14.6
WBC: 15.8 — ABNORMAL HIGH
WBC: 17.7 — ABNORMAL HIGH

## 2010-12-16 LAB — BASIC METABOLIC PANEL
BUN: 40 — ABNORMAL HIGH
Calcium: 9.5
Creatinine, Ser: 1.47 — ABNORMAL HIGH
GFR calc Af Amer: 40 — ABNORMAL LOW

## 2010-12-16 LAB — DIFFERENTIAL
Basophils Absolute: 0
Eosinophils Relative: 2
Monocytes Absolute: 0.7
Monocytes Relative: 4
Neutrophils Relative %: 34 — ABNORMAL LOW

## 2010-12-17 LAB — COMPREHENSIVE METABOLIC PANEL
AST: 23
Albumin: 3.5
BUN: 31 — ABNORMAL HIGH
Calcium: 9
Creatinine, Ser: 1.32 — ABNORMAL HIGH
GFR calc Af Amer: 46 — ABNORMAL LOW
Total Bilirubin: 0.6
Total Protein: 6.1

## 2010-12-17 LAB — CBC
HCT: 27.8 — ABNORMAL LOW
MCHC: 34.2
MCV: 92.3
Platelets: 210
RDW: 13.6
WBC: 15.9 — ABNORMAL HIGH

## 2010-12-17 LAB — DIFFERENTIAL
Basophils Relative: 1
Eosinophils Absolute: 0.3
Monocytes Relative: 4
Neutrophils Relative %: 32 — ABNORMAL LOW

## 2010-12-17 LAB — ERYTHROPOIETIN: Erythropoietin: 20.3

## 2010-12-18 LAB — CBC
HCT: 27.2 % — ABNORMAL LOW (ref 36.0–46.0)
MCV: 91.3 fL (ref 78.0–100.0)
Platelets: 237 10*3/uL (ref 150–400)
RDW: 14.8 % (ref 11.5–15.5)

## 2010-12-18 LAB — RETICULOCYTES
RBC.: 2.98 MIL/uL — ABNORMAL LOW (ref 3.87–5.11)
Retic Ct Pct: 1.4 % (ref 0.4–3.1)

## 2010-12-18 LAB — IRON AND TIBC: TIBC: 383 ug/dL (ref 250–470)

## 2010-12-22 ENCOUNTER — Ambulatory Visit (INDEPENDENT_AMBULATORY_CARE_PROVIDER_SITE_OTHER): Payer: Medicare Other | Admitting: *Deleted

## 2010-12-22 DIAGNOSIS — Z7901 Long term (current) use of anticoagulants: Secondary | ICD-10-CM

## 2010-12-22 DIAGNOSIS — I4891 Unspecified atrial fibrillation: Secondary | ICD-10-CM

## 2010-12-22 LAB — POCT INR: INR: 2.9

## 2010-12-24 ENCOUNTER — Ambulatory Visit (HOSPITAL_COMMUNITY): Payer: Medicare Other

## 2010-12-26 LAB — CARDIAC PANEL(CRET KIN+CKTOT+MB+TROPI)
Relative Index: INVALID
Relative Index: INVALID
Total CK: 25
Total CK: 34
Troponin I: 0.03
Troponin I: 0.03
Troponin I: 0.04

## 2010-12-26 LAB — BASIC METABOLIC PANEL
BUN: 26 — ABNORMAL HIGH
BUN: 34 — ABNORMAL HIGH
BUN: 50 — ABNORMAL HIGH
BUN: 51 — ABNORMAL HIGH
CO2: 31
CO2: 26
CO2: 27
Calcium: 9
Calcium: 9.1
Calcium: 8.4
Calcium: 8.7
Calcium: 9
Chloride: 112
Creatinine, Ser: 1.52 — ABNORMAL HIGH
Creatinine, Ser: 1.6 — ABNORMAL HIGH
Creatinine, Ser: 1.23 — ABNORMAL HIGH
Creatinine, Ser: 1.56 — ABNORMAL HIGH
GFR calc Af Amer: 37 — ABNORMAL LOW
GFR calc Af Amer: 38 — ABNORMAL LOW
GFR calc non Af Amer: 30 — ABNORMAL LOW
GFR calc non Af Amer: 39 — ABNORMAL LOW
GFR calc non Af Amer: 41 — ABNORMAL LOW
Glucose, Bld: 106 — ABNORMAL HIGH
Glucose, Bld: 104 — ABNORMAL HIGH
Glucose, Bld: 112 — ABNORMAL HIGH
Glucose, Bld: 163 — ABNORMAL HIGH
Potassium: 3.9
Potassium: 5.9 — ABNORMAL HIGH

## 2010-12-26 LAB — CBC
HCT: 26.4 — ABNORMAL LOW
Hemoglobin: 8.5 — ABNORMAL LOW
Hemoglobin: 8.8 — ABNORMAL LOW
MCHC: 33.7
MCHC: 33
MCHC: 34
MCV: 88.3
MCV: 88.6
MCV: 89
MCV: 89.3
Platelets: 164
Platelets: 176
Platelets: 177
Platelets: 189
RBC: 3.13 — ABNORMAL LOW
RBC: 3.02 — ABNORMAL LOW
RDW: 14.6 — ABNORMAL HIGH
RDW: 13.3
RDW: 13.7
RDW: 13.8
WBC: 11.3 — ABNORMAL HIGH
WBC: 12.8 — ABNORMAL HIGH
WBC: 8.7

## 2010-12-26 LAB — PROTIME-INR
INR: 3.2 — ABNORMAL HIGH
INR: 3.5 — ABNORMAL HIGH
INR: 2.1 — ABNORMAL HIGH
INR: 2.4 — ABNORMAL HIGH
INR: 2.5 — ABNORMAL HIGH
Prothrombin Time: 33.9 — ABNORMAL HIGH
Prothrombin Time: 36.3 — ABNORMAL HIGH
Prothrombin Time: 24.8 — ABNORMAL HIGH
Prothrombin Time: 25.3 — ABNORMAL HIGH
Prothrombin Time: 25.6 — ABNORMAL HIGH
Prothrombin Time: 26.9 — ABNORMAL HIGH
Prothrombin Time: 27.9 — ABNORMAL HIGH

## 2010-12-26 LAB — DIFFERENTIAL
Basophils Absolute: 0
Basophils Absolute: 0
Basophils Absolute: 0.1
Basophils Absolute: 0.1
Basophils Relative: 0
Basophils Relative: 0
Basophils Relative: 1
Eosinophils Absolute: 0.3
Eosinophils Absolute: 0.1
Eosinophils Absolute: 0.2
Eosinophils Absolute: 0.5
Eosinophils Relative: 1
Eosinophils Relative: 3
Eosinophils Relative: 6 — ABNORMAL HIGH
Lymphocytes Relative: 36
Lymphs Abs: 3.8 — ABNORMAL HIGH
Lymphs Abs: 4.1 — ABNORMAL HIGH
Monocytes Absolute: 0.8 — ABNORMAL HIGH
Monocytes Absolute: 0.9 — ABNORMAL HIGH
Monocytes Relative: 7
Neutro Abs: 4.9
Neutro Abs: 6.3
Neutro Abs: 9.8 — ABNORMAL HIGH
Neutrophils Relative %: 43
Neutrophils Relative %: 67

## 2010-12-26 LAB — POCT CARDIAC MARKERS: Operator id: 218581

## 2010-12-26 LAB — TSH: TSH: 1.47

## 2010-12-29 ENCOUNTER — Encounter (HOSPITAL_COMMUNITY): Payer: Medicare Other | Attending: Oncology

## 2010-12-29 DIAGNOSIS — D509 Iron deficiency anemia, unspecified: Secondary | ICD-10-CM | POA: Insufficient documentation

## 2010-12-29 DIAGNOSIS — D638 Anemia in other chronic diseases classified elsewhere: Secondary | ICD-10-CM | POA: Insufficient documentation

## 2010-12-29 DIAGNOSIS — N289 Disorder of kidney and ureter, unspecified: Secondary | ICD-10-CM | POA: Insufficient documentation

## 2010-12-29 LAB — CBC
HCT: 37.1 % (ref 36.0–46.0)
Hemoglobin: 10.3 — ABNORMAL LOW
Hemoglobin: 12 g/dL (ref 12.0–15.0)
MCHC: 33.2
MCHC: 32.3 g/dL (ref 30.0–36.0)
Platelets: 193
RDW: 13.2
RDW: 13.2 % (ref 11.5–15.5)
WBC: 20.8 10*3/uL — ABNORMAL HIGH (ref 4.0–10.5)

## 2010-12-29 LAB — BLOOD GAS, ARTERIAL
Acid-Base Excess: 0.4
Bicarbonate: 24.6 — ABNORMAL HIGH
Patient temperature: 98.6
pH, Arterial: 7.408 — ABNORMAL HIGH

## 2010-12-29 LAB — BASIC METABOLIC PANEL
BUN: 20
BUN: 22
CO2: 25
CO2: 33 — ABNORMAL HIGH
Calcium: 8.3 — ABNORMAL LOW
Chloride: 102
Chloride: 103
Creatinine, Ser: 1.29 — ABNORMAL HIGH
Creatinine, Ser: 1.45 — ABNORMAL HIGH
GFR calc Af Amer: 41 — ABNORMAL LOW
GFR calc Af Amer: 48 — ABNORMAL LOW
GFR calc non Af Amer: 39 — ABNORMAL LOW
GFR calc non Af Amer: 40 — ABNORMAL LOW
Glucose, Bld: 184 — ABNORMAL HIGH
Potassium: 3.6
Sodium: 136
Sodium: 141
Sodium: 142

## 2010-12-29 LAB — DIFFERENTIAL
Basophils Absolute: 0
Basophils Absolute: 0 10*3/uL (ref 0.0–0.1)
Basophils Relative: 0
Basophils Relative: 0 % (ref 0–1)
Eosinophils Absolute: 0.4 10*3/uL (ref 0.0–0.7)
Lymphocytes Relative: 20
Lymphocytes Relative: 69 % — ABNORMAL HIGH (ref 12–46)
Monocytes Absolute: 0.6
Monocytes Relative: 3 % (ref 3–12)
Neutro Abs: 8.9 — ABNORMAL HIGH
Neutrophils Relative %: 26 % — ABNORMAL LOW (ref 43–77)

## 2010-12-29 LAB — CARDIAC PANEL(CRET KIN+CKTOT+MB+TROPI)
CK, MB: 1.5
Relative Index: INVALID
Total CK: 49

## 2010-12-29 LAB — PROTIME-INR
INR: 1
INR: 1.1
Prothrombin Time: 14.4

## 2010-12-29 LAB — B-NATRIURETIC PEPTIDE (CONVERTED LAB)
Pro B Natriuretic peptide (BNP): 275 — ABNORMAL HIGH
Pro B Natriuretic peptide (BNP): 318 — ABNORMAL HIGH

## 2010-12-29 LAB — D-DIMER, QUANTITATIVE: D-Dimer, Quant: 0.72 — ABNORMAL HIGH

## 2010-12-29 LAB — POCT CARDIAC MARKERS: Troponin i, poc: <0.05

## 2010-12-29 NOTE — Progress Notes (Signed)
Procrit held. Hgb 12 Tolerated venipuncture well.

## 2010-12-31 ENCOUNTER — Other Ambulatory Visit: Payer: Self-pay | Admitting: Cardiology

## 2010-12-31 MED ORDER — WARFARIN SODIUM 2.5 MG PO TABS: 2.5000 mg | ORAL_TABLET | ORAL | Status: DC

## 2011-01-09 ENCOUNTER — Encounter (HOSPITAL_BASED_OUTPATIENT_CLINIC_OR_DEPARTMENT_OTHER): Payer: Medicare Other

## 2011-01-09 VITALS — BP 131/79 | HR 71 | Temp 97.7°F

## 2011-01-09 DIAGNOSIS — D638 Anemia in other chronic diseases classified elsewhere: Secondary | ICD-10-CM

## 2011-01-09 DIAGNOSIS — D509 Iron deficiency anemia, unspecified: Secondary | ICD-10-CM

## 2011-01-09 DIAGNOSIS — N289 Disorder of kidney and ureter, unspecified: Secondary | ICD-10-CM

## 2011-01-09 MED ORDER — FERUMOXYTOL INJECTION 510 MG/17 ML: 510.0000 mg | Freq: Once | INTRAVENOUS | Status: DC

## 2011-01-09 MED ORDER — SODIUM CHLORIDE 0.9 % IJ SOLN
INTRAMUSCULAR | Status: AC
  Administered 2011-01-09: 10 mL
  Filled 2011-01-09: qty 10

## 2011-01-09 MED ORDER — FERUMOXYTOL INJECTION 510 MG/17 ML
510.0000 mg | Freq: Once | INTRAVENOUS | Status: AC
  Administered 2011-01-09: 510 mg via INTRAVENOUS
  Filled 2011-01-09: qty 17

## 2011-01-12 ENCOUNTER — Ambulatory Visit (INDEPENDENT_AMBULATORY_CARE_PROVIDER_SITE_OTHER): Payer: Medicare Other | Admitting: *Deleted

## 2011-01-12 DIAGNOSIS — Z7901 Long term (current) use of anticoagulants: Secondary | ICD-10-CM

## 2011-01-12 DIAGNOSIS — I4891 Unspecified atrial fibrillation: Secondary | ICD-10-CM

## 2011-01-12 LAB — POCT INR: INR: 2.6

## 2011-01-26 ENCOUNTER — Encounter (HOSPITAL_COMMUNITY): Payer: Medicare Other | Attending: Oncology

## 2011-01-26 ENCOUNTER — Encounter (HOSPITAL_COMMUNITY): Payer: Medicare Other

## 2011-01-26 DIAGNOSIS — D509 Iron deficiency anemia, unspecified: Secondary | ICD-10-CM | POA: Insufficient documentation

## 2011-01-26 DIAGNOSIS — N289 Disorder of kidney and ureter, unspecified: Secondary | ICD-10-CM

## 2011-01-26 DIAGNOSIS — D638 Anemia in other chronic diseases classified elsewhere: Secondary | ICD-10-CM | POA: Insufficient documentation

## 2011-01-26 LAB — DIFFERENTIAL
Basophils Absolute: 0 10*3/uL (ref 0.0–0.1)
Eosinophils Relative: 2 % (ref 0–5)
Lymphocytes Relative: 67 % — ABNORMAL HIGH (ref 12–46)
Monocytes Relative: 3 % (ref 3–12)

## 2011-01-26 LAB — CBC
Platelets: 197 10*3/uL (ref 150–400)
RBC: 3.55 MIL/uL — ABNORMAL LOW (ref 3.87–5.11)
RDW: 13.6 % (ref 11.5–15.5)
WBC: 19.8 10*3/uL — ABNORMAL HIGH (ref 4.0–10.5)

## 2011-01-26 MED ORDER — EPOETIN ALFA 40000 UNIT/ML IJ SOLN
INTRAMUSCULAR | Status: AC
  Filled 2011-01-26: qty 1

## 2011-01-26 MED ORDER — EPOETIN ALFA 40000 UNIT/ML IJ SOLN: 40000.0000 [IU] | Freq: Once | INTRAMUSCULAR | Status: DC

## 2011-01-26 NOTE — Progress Notes (Signed)
Addended by: Oda Kilts on: 01/26/2011 03:07 PM   Modules accepted: Orders

## 2011-01-26 NOTE — Progress Notes (Signed)
[name] presented for [employer]. Labs per MD order drawn via Peripheral Line 24 gauge needle inserted in rt ac  Good blood return present. Procedure without incident.  Needle removed intact. Patient tolerated procedure well.  Patient's hgb is 12. Procrit held per MD orders.

## 2011-02-09 ENCOUNTER — Ambulatory Visit (INDEPENDENT_AMBULATORY_CARE_PROVIDER_SITE_OTHER): Payer: Medicare Other | Admitting: *Deleted

## 2011-02-09 DIAGNOSIS — I4891 Unspecified atrial fibrillation: Secondary | ICD-10-CM

## 2011-02-09 DIAGNOSIS — Z7901 Long term (current) use of anticoagulants: Secondary | ICD-10-CM

## 2011-02-09 LAB — POCT INR: INR: 2

## 2011-02-24 ENCOUNTER — Encounter (HOSPITAL_COMMUNITY): Payer: Medicare Other | Attending: Oncology

## 2011-02-24 DIAGNOSIS — N289 Disorder of kidney and ureter, unspecified: Secondary | ICD-10-CM | POA: Insufficient documentation

## 2011-02-24 DIAGNOSIS — D638 Anemia in other chronic diseases classified elsewhere: Secondary | ICD-10-CM | POA: Insufficient documentation

## 2011-02-24 LAB — CBC
MCV: 97.6 fL (ref 78.0–100.0)
Platelets: 194 10*3/uL (ref 150–400)
RDW: 13.7 % (ref 11.5–15.5)
WBC: 15.5 10*3/uL — ABNORMAL HIGH (ref 4.0–10.5)

## 2011-02-24 MED ORDER — EPOETIN ALFA 40000 UNIT/ML IJ SOLN
40000.0000 [IU] | Freq: Once | INTRAMUSCULAR | Status: AC
  Administered 2011-02-24: 40000 [IU] via SUBCUTANEOUS

## 2011-02-24 MED ORDER — EPOETIN ALFA 40000 UNIT/ML IJ SOLN
INTRAMUSCULAR | Status: AC
  Administered 2011-02-24: 40000 [IU] via SUBCUTANEOUS
  Filled 2011-02-24: qty 1

## 2011-02-24 NOTE — Progress Notes (Signed)
[name] presents today for injection per MD orders. Procrit 40,000 units administered SQ in left Abdomen. Administration without incident. Patient tolerated well.

## 2011-02-24 NOTE — Progress Notes (Signed)
[name] presented for labwork. Labs per MD order drawn via Peripheral Line 23 gauge needle inserted in left AC  Good blood return present. Procedure without incident.  Needle removed intact. Patient tolerated procedure well.   

## 2011-03-05 ENCOUNTER — Encounter: Payer: Medicare Other | Admitting: *Deleted

## 2011-03-11 ENCOUNTER — Ambulatory Visit (INDEPENDENT_AMBULATORY_CARE_PROVIDER_SITE_OTHER): Payer: Medicare Other | Admitting: *Deleted

## 2011-03-11 DIAGNOSIS — Z7901 Long term (current) use of anticoagulants: Secondary | ICD-10-CM

## 2011-03-11 DIAGNOSIS — I4891 Unspecified atrial fibrillation: Secondary | ICD-10-CM

## 2011-03-11 LAB — POCT INR: INR: 1.9

## 2011-03-27 ENCOUNTER — Encounter: Payer: Self-pay | Admitting: Internal Medicine

## 2011-03-27 ENCOUNTER — Ambulatory Visit (INDEPENDENT_AMBULATORY_CARE_PROVIDER_SITE_OTHER): Payer: Medicare Other | Admitting: *Deleted

## 2011-03-27 ENCOUNTER — Encounter (HOSPITAL_COMMUNITY): Payer: Medicare Other | Attending: Oncology

## 2011-03-27 DIAGNOSIS — D638 Anemia in other chronic diseases classified elsewhere: Secondary | ICD-10-CM | POA: Insufficient documentation

## 2011-03-27 DIAGNOSIS — N289 Disorder of kidney and ureter, unspecified: Secondary | ICD-10-CM | POA: Insufficient documentation

## 2011-03-27 DIAGNOSIS — I495 Sick sinus syndrome: Secondary | ICD-10-CM

## 2011-03-27 LAB — PACEMAKER DEVICE OBSERVATION
AL AMPLITUDE: 2 mv
RV LEAD AMPLITUDE: 22.4 mv
RV LEAD IMPEDENCE PM: 582 Ohm
RV LEAD THRESHOLD: 0.5 V
VENTRICULAR PACING PM: 28

## 2011-03-27 LAB — DIFFERENTIAL
Basophils Absolute: 0 10*3/uL (ref 0.0–0.1)
Eosinophils Absolute: 0.3 10*3/uL (ref 0.0–0.7)
Lymphocytes Relative: 59 % — ABNORMAL HIGH (ref 12–46)
Lymphs Abs: 9.6 10*3/uL — ABNORMAL HIGH (ref 0.7–4.0)
Neutro Abs: 5.7 10*3/uL (ref 1.7–7.7)

## 2011-03-27 LAB — CBC
HCT: 35.1 % — ABNORMAL LOW (ref 36.0–46.0)
MCHC: 32.5 g/dL (ref 30.0–36.0)
RDW: 13.3 % (ref 11.5–15.5)

## 2011-03-27 NOTE — Progress Notes (Signed)
PPM check 

## 2011-03-27 NOTE — Progress Notes (Signed)
[name] presented for [employer]. Labs per MD order drawn via Peripheral Line 23 gauge needle inserted in left antecubital.  Good blood return present. Procedure without incident.  Needle removed intact. Patient tolerated procedure well.  Procrit held today. Recheck labs in 4 weeks.

## 2011-04-08 ENCOUNTER — Ambulatory Visit (INDEPENDENT_AMBULATORY_CARE_PROVIDER_SITE_OTHER): Payer: Medicare Other | Admitting: *Deleted

## 2011-04-08 DIAGNOSIS — Z7901 Long term (current) use of anticoagulants: Secondary | ICD-10-CM

## 2011-04-08 DIAGNOSIS — I4891 Unspecified atrial fibrillation: Secondary | ICD-10-CM

## 2011-04-20 ENCOUNTER — Other Ambulatory Visit: Payer: Self-pay | Admitting: *Deleted

## 2011-04-20 MED ORDER — METOPROLOL TARTRATE 25 MG PO TABS: 25.0000 mg | ORAL_TABLET | Freq: Two times a day (BID) | ORAL | Status: DC

## 2011-04-20 MED ORDER — LISINOPRIL 10 MG PO TABS: 10.0000 mg | ORAL_TABLET | Freq: Every day | ORAL | Status: DC

## 2011-04-22 ENCOUNTER — Ambulatory Visit (INDEPENDENT_AMBULATORY_CARE_PROVIDER_SITE_OTHER): Payer: Medicare Other | Admitting: *Deleted

## 2011-04-22 DIAGNOSIS — Z7901 Long term (current) use of anticoagulants: Secondary | ICD-10-CM

## 2011-04-22 DIAGNOSIS — I4891 Unspecified atrial fibrillation: Secondary | ICD-10-CM

## 2011-04-24 ENCOUNTER — Encounter: Payer: Self-pay | Admitting: Cardiology

## 2011-04-24 ENCOUNTER — Encounter (HOSPITAL_COMMUNITY): Payer: Medicare Other | Attending: Oncology

## 2011-04-24 DIAGNOSIS — I509 Heart failure, unspecified: Secondary | ICD-10-CM | POA: Insufficient documentation

## 2011-04-24 DIAGNOSIS — N189 Chronic kidney disease, unspecified: Secondary | ICD-10-CM | POA: Insufficient documentation

## 2011-04-24 DIAGNOSIS — C9110 Chronic lymphocytic leukemia of B-cell type not having achieved remission: Secondary | ICD-10-CM | POA: Insufficient documentation

## 2011-04-24 DIAGNOSIS — K219 Gastro-esophageal reflux disease without esophagitis: Secondary | ICD-10-CM | POA: Insufficient documentation

## 2011-04-24 DIAGNOSIS — D638 Anemia in other chronic diseases classified elsewhere: Secondary | ICD-10-CM

## 2011-04-24 DIAGNOSIS — N289 Disorder of kidney and ureter, unspecified: Secondary | ICD-10-CM

## 2011-04-24 DIAGNOSIS — I350 Nonrheumatic aortic (valve) stenosis: Secondary | ICD-10-CM | POA: Insufficient documentation

## 2011-04-24 LAB — DIFFERENTIAL
Basophils Absolute: 0.1 10*3/uL (ref 0.0–0.1)
Basophils Relative: 0 % (ref 0–1)
Eosinophils Absolute: 0.5 10*3/uL (ref 0.0–0.7)
Eosinophils Relative: 2 % (ref 0–5)

## 2011-04-24 LAB — CBC
MCH: 32.4 pg (ref 26.0–34.0)
MCV: 98.3 fL (ref 78.0–100.0)
Platelets: 203 10*3/uL (ref 150–400)
RDW: 13 % (ref 11.5–15.5)
WBC: 22.3 10*3/uL — ABNORMAL HIGH (ref 4.0–10.5)

## 2011-04-24 NOTE — Progress Notes (Signed)
Specimen obtained from left [facility] for lab.  Tolerated well. Procrit inj held,  Hgb 11.5.

## 2011-04-27 ENCOUNTER — Ambulatory Visit (INDEPENDENT_AMBULATORY_CARE_PROVIDER_SITE_OTHER): Payer: Medicare Other | Admitting: *Deleted

## 2011-04-27 ENCOUNTER — Encounter: Payer: Self-pay | Admitting: Cardiology

## 2011-04-27 ENCOUNTER — Ambulatory Visit (INDEPENDENT_AMBULATORY_CARE_PROVIDER_SITE_OTHER): Payer: Medicare Other | Admitting: Cardiology

## 2011-04-27 DIAGNOSIS — I495 Sick sinus syndrome: Secondary | ICD-10-CM

## 2011-04-27 DIAGNOSIS — E785 Hyperlipidemia, unspecified: Secondary | ICD-10-CM

## 2011-04-27 DIAGNOSIS — C9110 Chronic lymphocytic leukemia of B-cell type not having achieved remission: Secondary | ICD-10-CM

## 2011-04-27 DIAGNOSIS — I10 Essential (primary) hypertension: Secondary | ICD-10-CM

## 2011-04-27 DIAGNOSIS — N189 Chronic kidney disease, unspecified: Secondary | ICD-10-CM

## 2011-04-27 DIAGNOSIS — Z7901 Long term (current) use of anticoagulants: Secondary | ICD-10-CM

## 2011-04-27 DIAGNOSIS — I350 Nonrheumatic aortic (valve) stenosis: Secondary | ICD-10-CM

## 2011-04-27 DIAGNOSIS — I2510 Atherosclerotic heart disease of native coronary artery without angina pectoris: Secondary | ICD-10-CM

## 2011-04-27 DIAGNOSIS — I359 Nonrheumatic aortic valve disorder, unspecified: Secondary | ICD-10-CM

## 2011-04-27 DIAGNOSIS — I509 Heart failure, unspecified: Secondary | ICD-10-CM

## 2011-04-27 DIAGNOSIS — I4891 Unspecified atrial fibrillation: Secondary | ICD-10-CM

## 2011-04-27 NOTE — Assessment & Plan Note (Signed)
CHF appears compensated at the present time.  The various manifestations of critical aortic stenosis were described to this nice woman, and she was advised to report them should they occur.

## 2011-04-27 NOTE — Assessment & Plan Note (Signed)
White count has been stable and not markedly elevated, most recently 16,200.  Dr. Mariel Sleet continues to follow this problem, which has not required much treatment.

## 2011-04-27 NOTE — Assessment & Plan Note (Signed)
Patient would be at substantial risk for surgery or percutaneous intervention to treat aortic stenosis, primarily due to her advanced age.  For now, we will continue to watch her closely and treat her symptomatically.

## 2011-04-27 NOTE — Assessment & Plan Note (Signed)
Last evaluated by Dr. Ladona Ridgel in 08/2010-he will continue to follow her as appropriate.

## 2011-04-27 NOTE — Assessment & Plan Note (Addendum)
Current medical therapy is appropriate for coronary artery disease with essentially single vessel disease.  Lipid profile is pending to assess adequacy of current lipid-lowering therapy.

## 2011-04-27 NOTE — Assessment & Plan Note (Signed)
With recent entirely normal creatinine and BUN, there does not appear to be an important component of chronic renal disease.

## 2011-04-27 NOTE — Patient Instructions (Signed)
Your physician recommends that you schedule a follow-up appointment in: 8 months  Stools for blood and return to office asap  Your physician recommends that you return for lab work in: Today

## 2011-04-27 NOTE — Assessment & Plan Note (Signed)
Low total and LDL cholesterol with low HDL and mildly elevated triglycerides.   Lipid profile is suboptimal, but additional pharmacologic therapy is not necessary.  Current treatment with low-dose simvastatin appears adequately effective; however, concomitant administration with verapamil is not recommended.  Pravastatin will be substituted.

## 2011-04-27 NOTE — Progress Notes (Signed)
Patient ID: [name], female   DOB: 11-24-1915, 78 y.o.   MRN: [id number] HPI: [name] returns to the office for assessment prior to planned epidural injection for chronic back pain with left-sided radicular symptoms.  From a cardiac standpoint, moderate aortic stenosis has not clearly been symptomatic.  Additional medical issues include hypertension, hyperlipidemia, chronic anemia associated with chronic lymphocytic leukemia, and possible iron deficiency.  Performance status has been remarkably good.  She has had intermittent dizziness and dyspnea over the past few years, but this has not clearly been exertional nor has it definitely been related to valvular heart disease.  Prior to Admission medications   Medication Sig Start Date End Date Taking? Authorizing Provider  acetaminophen (TYLENOL) 500 MG tablet Take 1,000 mg by mouth 4 (four) times daily.     Yes Historical Provider, MD  calcium-vitamin D (OSCAL WITH D) 500-200 MG-UNIT per tablet Take 1 tablet by mouth daily.     Yes Historical Provider, MD  epoetin alfa (PROCRIT) 78295 UNIT/ML injection Inject into the skin.     Yes Historical Provider, MD  furosemide (LASIX) 20 MG tablet Take 1 tablet (20 mg total) by mouth as needed. 10/06/10  Yes Joni Reining, NP  lisinopril (PRINIVIL,ZESTRIL) 10 MG tablet Take 1 tablet (10 mg total) by mouth daily. 04/20/11  Yes Jonelle Sidle, MD  metoprolol tartrate (LOPRESSOR) 25 MG tablet Take 1 tablet (25 mg total) by mouth 2 (two) times daily. 04/20/11  Yes Jonelle Sidle, MD  Multiple Vitamins-Minerals (MULTIVITAMIN WITH MINERALS) tablet Take 1 tablet by mouth daily.     Yes Historical Provider, MD  omeprazole (PRILOSEC) 20 MG capsule Take 20 mg by mouth as needed.    Yes Historical Provider, MD  pravastatin (PRAVACHOL) 40 MG tablet Take 40 mg by mouth daily.     Yes Historical Provider, MD  verapamil (CALAN-SR) 180 MG CR tablet Take 1 tablet (180 mg total) by mouth at bedtime. 10/03/10  Yes Joni Reining, NP  warfarin (COUMADIN) 2.5 MG tablet Take 1 tablet (2.5 mg total) by mouth as directed. 12/31/10  Yes Gerrit Friends. Dietrich Pates, MD   Allergies  Allergen Reactions  . Detrol (Tolterodine Tartrate)   . Sanctura (Trospium Chloride) Nausea Only and Other (See Comments)    Intense lower abd pain  . Sulfa Antibiotics Other (See Comments)    Extreme dizziness  . Sulfonamide Derivatives     REACTION: GI distress     Past medical history, social history, and family history reviewed and updated.  ROS: Denies orthopnea, PND, palpitations or syncope.  PHYSICAL EXAM: BP 178/74  Pulse 82  Resp 18  Ht 5' (1.524 m)  Wt 75.751 kg (167 lb)  BMI 32.62 kg/m2; repeat blood pressure determination-170/60 General-Well developed; no acute distress; vivacious; mentally intact Body habitus-moderately overweight Neck-No JVD; no carotid bruits Lungs-clear lung fields; resonant to percussion; mild kyphosis Cardiovascular-normal PMI; normal S1 and decreased intensity of S2; grade 2-3/6 buzzing mid peaking systolic murmur at the left sternal border radiating to the carotids Abdomen-normal bowel sounds; soft and non-tender without masses or organomegaly; aortic pulsation not palpable Musculoskeletal-No deformities, no cyanosis or clubbing Neurologic-Normal cranial nerves; symmetric strength and tone Skin-Warm, no significant lesions Extremities-distal pulses intact; no edema  ASSESSMENT AND PLAN:  [name], MD 04/27/2011 3:33 PM

## 2011-04-27 NOTE — Assessment & Plan Note (Addendum)
Blood pressure control is inadequate at this visit although patient reports systolics below 140 at home.   She will bring a list of at least 10 measurements from to her next office visit.

## 2011-04-27 NOTE — Assessment & Plan Note (Addendum)
She is tolerating treatment with warfarin well with stable and therapeutic INRs.  We will continue to monitor for occult GI blood loss.  Anemia is probably unrelated to anticoagulation.  No recent stool Hemoccult determinations-samples will be obtained.  Risk of thromboembolism is moderate.  In the absence of high risk features, warfarin can be stopped 5 days prior to epidural injection and INR measured immediately prior to procedure.  Resume usual warfarin the evening of the injection and check INR in 1 week.

## 2011-04-28 ENCOUNTER — Encounter: Payer: Self-pay | Admitting: *Deleted

## 2011-04-28 LAB — COMPREHENSIVE METABOLIC PANEL
CO2: 26 mEq/L (ref 19–32)
Creat: 1.16 mg/dL — ABNORMAL HIGH (ref 0.50–1.10)
Glucose, Bld: 114 mg/dL — ABNORMAL HIGH (ref 70–99)
Total Bilirubin: 0.3 mg/dL (ref 0.3–1.2)

## 2011-04-28 LAB — TSH: TSH: 2.659 u[IU]/mL (ref 0.350–4.500)

## 2011-05-01 ENCOUNTER — Ambulatory Visit (HOSPITAL_COMMUNITY): Payer: Medicare Other | Admitting: Oncology

## 2011-05-01 ENCOUNTER — Encounter (INDEPENDENT_AMBULATORY_CARE_PROVIDER_SITE_OTHER): Payer: Medicare Other | Admitting: *Deleted

## 2011-05-01 ENCOUNTER — Encounter (HOSPITAL_BASED_OUTPATIENT_CLINIC_OR_DEPARTMENT_OTHER): Payer: Medicare Other

## 2011-05-01 ENCOUNTER — Encounter: Payer: Self-pay | Admitting: Cardiology

## 2011-05-01 ENCOUNTER — Encounter (HOSPITAL_COMMUNITY): Payer: Medicare Other | Admitting: Oncology

## 2011-05-01 DIAGNOSIS — Z7901 Long term (current) use of anticoagulants: Secondary | ICD-10-CM

## 2011-05-01 DIAGNOSIS — C9110 Chronic lymphocytic leukemia of B-cell type not having achieved remission: Secondary | ICD-10-CM

## 2011-05-02 LAB — IRON AND TIBC
Saturation Ratios: 18 % — ABNORMAL LOW (ref 20–55)
TIBC: 276 ug/dL (ref 250–470)

## 2011-05-04 ENCOUNTER — Encounter (HOSPITAL_BASED_OUTPATIENT_CLINIC_OR_DEPARTMENT_OTHER): Payer: Medicare Other | Admitting: Oncology

## 2011-05-04 DIAGNOSIS — D509 Iron deficiency anemia, unspecified: Secondary | ICD-10-CM

## 2011-05-04 DIAGNOSIS — L989 Disorder of the skin and subcutaneous tissue, unspecified: Secondary | ICD-10-CM

## 2011-05-04 DIAGNOSIS — E611 Iron deficiency: Secondary | ICD-10-CM

## 2011-05-04 DIAGNOSIS — D638 Anemia in other chronic diseases classified elsewhere: Secondary | ICD-10-CM

## 2011-05-04 DIAGNOSIS — C9110 Chronic lymphocytic leukemia of B-cell type not having achieved remission: Secondary | ICD-10-CM

## 2011-05-04 NOTE — Progress Notes (Signed)
CC:   Edward L. Juanetta Gosling, M.D. Gerrit Friends. Dietrich Pates, MD, [facility] Nadara Mustard, MD  DIAGNOSES: 1. Chronic lymphocytic leukemia, stage I, not in need of therapy. 2. Anemia, multifactorial with both iron deficiency as well as an     element of anemia of chronic disease. 3. Atrial fibrillation on Coumadin, status post pacemaker insertion     11/18/2006. 4. Left hip pain, status post hip injections x2 by Dr. Naaman Plummer and     Dr. Lajoyce Corners with tremendous improvement for 5 months and then her hip     started bothering her around the end of November close to     Thanksgiving.  It has been getting progressively worse and she is     to see Dr. Alvester Morin again soon hopefully. 5. Mild renal insufficiency, which is stable. 6. Mild chronic shortness of breath with a history of asthma as a     child that was severe. 7. Cervical spine fusion in the past. 8. History of herpes zoster in the past. 9. Lesion on the left upper chest wall consistent with a possible     basal cell carcinoma.  We will get her to Dr. Charlton Haws in the near     future. 10.Hysterectomy in 1972 for benign disease. 11.Hypertension. 12.Dehydration in the past. 13.Cholecystectomy in the past. 14.Hyperkalemia in the past. [name] is doing very well from the standpoint of her CLL.  Her white count is still slightly elevated at 22,300 the other day and her total lymphocyte count is 15,300, but her platelets are good.  Her hemoglobin is holding up very nicely.  It has been several weeks since we have had to give her any Procrit.  Her last Procrit injection was actually on 02/24/2011, so she is holding up very nicely.  Her ferritin was also very good the other day at 178.  Serum iron, TIBC, and percent saturation were all stable and that was consistent most recently with anemia of chronic disease picture.  Her TSH was stable.  Glucose was minimally elevated at 114 the other day.  Creatinine was 1.16.  Liver enzymes were normal.  She got  here and told the nurse that she was having a recurrent bout of shingles, but when she showed me the lesion that she was concerned about, she states that she has had this for 3 to 6 months and it is on the anterior chest several inches, approximately an inch and a half to the left of the midline of the sternum and it is about a centimeter across.  It is raised, it has irregular pearly borders, and is very worrisome for a tumor.  It does itch, she states.  She has numerous other seborrheic keratoses, but this does not look the same.  I see no other areas that look like tumor or zoster.  She has no palpable adenopathy in the cervical or supraclavicular areas that are obvious. She has a very soft grade 1/6 systolic murmur.  She has an irregular rhythm, but normal rate.  She has no hepatosplenomegaly.  I cannot feel adenopathy in any location.  Breast exam shows no masses.  Her pacemaker is present.  She has no peripheral edema.  Lungs are clear.  Heart does not reveal an S3 gallop.  So I think we have addressed a few issues today.  She needs a dermatology consultation.  She is going to call Dr. Alvester Morin and Dr. Audrie Lia office 1 more time.  If she does not get a response  from them by tomorrow, she is to call me back and I will be glad to call them, but she called 3 times, she states last week and has not heard back about an appointment, but she has already checked with Dr. Dietrich Pates about being off the Coumadin for just 3 days this time and that should be sufficient.  From my standpoint, we need to continue to hold the Procrit since she is very stable at this time.  We will see her back in 4 months either way, but she needs no therapy for her CLL.    ______________________________ Ladona Horns. Mariel Sleet, MD ESN/MEDQ  D:  05/04/2011  T:  05/04/2011  Job:  [id number]

## 2011-05-04 NOTE — Progress Notes (Signed)
Labs done 2/15 and will see MD once labs done. No iron infusion today per Dr. Mariel Sleet.

## 2011-05-04 NOTE — Patient Instructions (Signed)
[facility]  Discharge Instructions  RECOMMENDATIONS MADE BY THE CONSULTANT AND ANY TEST RESULTS WILL BE SENT TO YOUR REFERRING DOCTOR.   EXAM FINDINGS BY MD TODAY AND SIGNS AND SYMPTOMS TO REPORT TO CLINIC OR PRIMARY MD: You have an area on left anterior chest wall that we need a dermatologist to remove.  We will check your labs every 5 weeks and poss give you procrit if needed.  We will check your iron in 4 months and then you will see Dr. Mariel Sleet  I acknowledge that I have been informed and understand all the instructions given to me and received a copy. I do not have any more questions at this time, but understand that I may call the [facility]ty Clinic at [facility] at [phone] during business hours should I have any further questions or need assistance in obtaining follow-up care.    __________________________________________  _____________  __________ Signature of Patient or Authorized Representative            Date                   Time    __________________________________________ Nurse's Signature

## 2011-05-04 NOTE — Progress Notes (Signed)
This office note has been dictated.

## 2011-05-06 ENCOUNTER — Ambulatory Visit (HOSPITAL_COMMUNITY): Payer: Medicare Other | Admitting: Oncology

## 2011-05-06 ENCOUNTER — Other Ambulatory Visit: Payer: Self-pay | Admitting: *Deleted

## 2011-05-06 DIAGNOSIS — E782 Mixed hyperlipidemia: Secondary | ICD-10-CM

## 2011-05-07 ENCOUNTER — Other Ambulatory Visit: Payer: Self-pay | Admitting: *Deleted

## 2011-05-07 DIAGNOSIS — I2510 Atherosclerotic heart disease of native coronary artery without angina pectoris: Secondary | ICD-10-CM

## 2011-05-07 DIAGNOSIS — I10 Essential (primary) hypertension: Secondary | ICD-10-CM

## 2011-05-07 MED ORDER — VERAPAMIL HCL ER 180 MG PO TBCR: 180.0000 mg | EXTENDED_RELEASE_TABLET | Freq: Every day | ORAL | Status: DC

## 2011-05-08 ENCOUNTER — Other Ambulatory Visit: Payer: Self-pay

## 2011-05-08 DIAGNOSIS — Z7901 Long term (current) use of anticoagulants: Secondary | ICD-10-CM

## 2011-05-11 ENCOUNTER — Encounter: Payer: Self-pay | Admitting: *Deleted

## 2011-05-13 ENCOUNTER — Ambulatory Visit (INDEPENDENT_AMBULATORY_CARE_PROVIDER_SITE_OTHER): Payer: Medicare Other | Admitting: *Deleted

## 2011-05-13 DIAGNOSIS — Z7901 Long term (current) use of anticoagulants: Secondary | ICD-10-CM

## 2011-05-13 DIAGNOSIS — I4891 Unspecified atrial fibrillation: Secondary | ICD-10-CM

## 2011-05-13 LAB — POCT INR: INR: 1.1

## 2011-05-20 ENCOUNTER — Other Ambulatory Visit: Payer: Self-pay | Admitting: *Deleted

## 2011-05-20 MED ORDER — WARFARIN SODIUM 2.5 MG PO TABS: 2.5000 mg | ORAL_TABLET | ORAL | Status: DC

## 2011-05-20 MED ORDER — LISINOPRIL 10 MG PO TABS: 10.0000 mg | ORAL_TABLET | Freq: Every day | ORAL | Status: DC

## 2011-05-20 MED ORDER — METOPROLOL TARTRATE 25 MG PO TABS: 25.0000 mg | ORAL_TABLET | Freq: Two times a day (BID) | ORAL | Status: DC

## 2011-05-21 ENCOUNTER — Ambulatory Visit (INDEPENDENT_AMBULATORY_CARE_PROVIDER_SITE_OTHER): Payer: Medicare Other | Admitting: *Deleted

## 2011-05-21 DIAGNOSIS — Z7901 Long term (current) use of anticoagulants: Secondary | ICD-10-CM

## 2011-05-21 DIAGNOSIS — I4891 Unspecified atrial fibrillation: Secondary | ICD-10-CM

## 2011-05-22 ENCOUNTER — Other Ambulatory Visit: Payer: Self-pay | Admitting: *Deleted

## 2011-05-22 DIAGNOSIS — E782 Mixed hyperlipidemia: Secondary | ICD-10-CM

## 2011-05-27 ENCOUNTER — Ambulatory Visit (INDEPENDENT_AMBULATORY_CARE_PROVIDER_SITE_OTHER): Payer: Medicare Other | Admitting: *Deleted

## 2011-05-27 ENCOUNTER — Encounter: Payer: Self-pay | Admitting: *Deleted

## 2011-05-27 DIAGNOSIS — Z7901 Long term (current) use of anticoagulants: Secondary | ICD-10-CM

## 2011-05-27 LAB — LIPID PANEL
Cholesterol: 145 mg/dL (ref 0–200)
Triglycerides: 186 mg/dL — ABNORMAL HIGH (ref <150)

## 2011-05-29 ENCOUNTER — Ambulatory Visit (HOSPITAL_COMMUNITY): Payer: Medicare Other

## 2011-06-04 ENCOUNTER — Ambulatory Visit (INDEPENDENT_AMBULATORY_CARE_PROVIDER_SITE_OTHER): Payer: Medicare Other | Admitting: *Deleted

## 2011-06-04 DIAGNOSIS — I4891 Unspecified atrial fibrillation: Secondary | ICD-10-CM

## 2011-06-04 DIAGNOSIS — Z7901 Long term (current) use of anticoagulants: Secondary | ICD-10-CM

## 2011-06-04 LAB — POCT INR: INR: 1.7

## 2011-06-09 ENCOUNTER — Encounter (HOSPITAL_COMMUNITY): Payer: Medicare Other | Attending: Oncology

## 2011-06-09 DIAGNOSIS — D638 Anemia in other chronic diseases classified elsewhere: Secondary | ICD-10-CM | POA: Insufficient documentation

## 2011-06-09 DIAGNOSIS — N289 Disorder of kidney and ureter, unspecified: Secondary | ICD-10-CM | POA: Insufficient documentation

## 2011-06-09 LAB — DIFFERENTIAL
Basophils Absolute: 0 10*3/uL (ref 0.0–0.1)
Eosinophils Relative: 1 % (ref 0–5)
Lymphocytes Relative: 67 % — ABNORMAL HIGH (ref 12–46)
Lymphs Abs: 15 10*3/uL — ABNORMAL HIGH (ref 0.7–4.0)
Monocytes Relative: 3 % (ref 3–12)
Neutro Abs: 6.5 10*3/uL (ref 1.7–7.7)

## 2011-06-09 LAB — CBC
HCT: 35.7 % — ABNORMAL LOW (ref 36.0–46.0)
MCV: 99.2 fL (ref 78.0–100.0)
Platelets: 215 10*3/uL (ref 150–400)
RBC: 3.6 MIL/uL — ABNORMAL LOW (ref 3.87–5.11)
WBC: 22.4 10*3/uL — ABNORMAL HIGH (ref 4.0–10.5)

## 2011-06-09 NOTE — Progress Notes (Signed)
hgb 11. Injection not given.

## 2011-06-09 NOTE — Progress Notes (Signed)
[name] presented for [employer]. Labs per MD order drawn via Peripheral Line 25 gauge needle inserted in rt ac.   Procedure without incident.  Needle removed intact. Patient tolerated procedure well.  Procrit held due to hgb 11.5. Labs due 5/6 and procrit pending those future labs.

## 2011-06-18 ENCOUNTER — Ambulatory Visit (INDEPENDENT_AMBULATORY_CARE_PROVIDER_SITE_OTHER): Payer: Medicare Other | Admitting: *Deleted

## 2011-06-18 DIAGNOSIS — Z7901 Long term (current) use of anticoagulants: Secondary | ICD-10-CM

## 2011-07-09 ENCOUNTER — Ambulatory Visit (INDEPENDENT_AMBULATORY_CARE_PROVIDER_SITE_OTHER): Payer: Medicare Other | Admitting: *Deleted

## 2011-07-09 DIAGNOSIS — Z7901 Long term (current) use of anticoagulants: Secondary | ICD-10-CM

## 2011-07-09 LAB — POCT INR: INR: 2.3

## 2011-07-20 ENCOUNTER — Encounter (HOSPITAL_COMMUNITY): Payer: Medicare Other | Attending: Oncology

## 2011-07-20 DIAGNOSIS — N289 Disorder of kidney and ureter, unspecified: Secondary | ICD-10-CM | POA: Insufficient documentation

## 2011-07-20 DIAGNOSIS — R059 Cough, unspecified: Secondary | ICD-10-CM | POA: Insufficient documentation

## 2011-07-20 DIAGNOSIS — E611 Iron deficiency: Secondary | ICD-10-CM

## 2011-07-20 DIAGNOSIS — C9110 Chronic lymphocytic leukemia of B-cell type not having achieved remission: Secondary | ICD-10-CM | POA: Insufficient documentation

## 2011-07-20 DIAGNOSIS — R05 Cough: Secondary | ICD-10-CM | POA: Insufficient documentation

## 2011-07-20 DIAGNOSIS — D509 Iron deficiency anemia, unspecified: Secondary | ICD-10-CM

## 2011-07-20 DIAGNOSIS — D638 Anemia in other chronic diseases classified elsewhere: Secondary | ICD-10-CM | POA: Insufficient documentation

## 2011-07-20 LAB — DIFFERENTIAL
Basophils Absolute: 0.1 10*3/uL (ref 0.0–0.1)
Lymphocytes Relative: 54 % — ABNORMAL HIGH (ref 12–46)
Monocytes Absolute: 1.1 10*3/uL — ABNORMAL HIGH (ref 0.1–1.0)
Monocytes Relative: 5 % (ref 3–12)
Neutro Abs: 8.9 10*3/uL — ABNORMAL HIGH (ref 1.7–7.7)

## 2011-07-20 LAB — CBC
HCT: 32 % — ABNORMAL LOW (ref 36.0–46.0)
Hemoglobin: 10.5 g/dL — ABNORMAL LOW (ref 12.0–15.0)
RDW: 13.9 % (ref 11.5–15.5)
WBC: 22.4 10*3/uL — ABNORMAL HIGH (ref 4.0–10.5)

## 2011-07-20 NOTE — Progress Notes (Signed)
Labs drawn today for cbc,diff 

## 2011-07-22 ENCOUNTER — Encounter (HOSPITAL_BASED_OUTPATIENT_CLINIC_OR_DEPARTMENT_OTHER): Payer: Medicare Other | Admitting: Oncology

## 2011-07-22 VITALS — BP 125/67 | HR 76 | Temp 97.5°F | Wt 164.6 lb

## 2011-07-22 DIAGNOSIS — R058 Other specified cough: Secondary | ICD-10-CM

## 2011-07-22 DIAGNOSIS — C9110 Chronic lymphocytic leukemia of B-cell type not having achieved remission: Secondary | ICD-10-CM

## 2011-07-22 DIAGNOSIS — N289 Disorder of kidney and ureter, unspecified: Secondary | ICD-10-CM

## 2011-07-22 DIAGNOSIS — D649 Anemia, unspecified: Secondary | ICD-10-CM

## 2011-07-22 DIAGNOSIS — R059 Cough, unspecified: Secondary | ICD-10-CM

## 2011-07-22 DIAGNOSIS — R05 Cough: Secondary | ICD-10-CM

## 2011-07-22 DIAGNOSIS — D638 Anemia in other chronic diseases classified elsewhere: Secondary | ICD-10-CM

## 2011-07-22 MED ORDER — AMOXICILLIN-POT CLAVULANATE 875-125 MG PO TABS: 1.0000 | ORAL_TABLET | Freq: Two times a day (BID) | ORAL | Status: AC

## 2011-07-22 NOTE — Progress Notes (Signed)
[name], MD, MD [address] [place] [address]  1. Chronic lymphocytic leukemia     CURRENT THERAPY: Observation  INTERVAL HISTORY: [name] 78 y.o. female returns for  regular  visit for followup of CLL and anemia of chronic disease.   [name] is doing very well. She is 78 years old, she continues to drive, and he continues to take care of herself. She does admit to chronic fatigue which is an ongoing issue for her. It is not new or worse. This is however her only complaint initially.  The patient reports that she's recently gotten over a cold. She reports that she feels much better but continues to have a cough that is productive of yellow, neck, sputum. This is clearly a bacterial like infection such has not resolved in 2 weeks time. She explains that her cold began with a nasal congestion and sinus headache. That has improved and completely resolved but she continues to have this cough. In light of her CLL than likely her immunocompromise state, I would provide her with an antibiotic, namely Augmentin. She will take this for 10 days. I did review her allergy list and she has sulfonamide allergy but no penicillin allergy noted or intolerance to Augmentin in the past. I encourage the patient to take this with food in light of that occasionally causing nausea, vomiting, and upset stomach when taken on an empty stomach.  I personally reviewed and went over laboratory results with the patient. Her white blood cell count remains very stable 22.4. Her lymphocytes are slightly lower most recently at 12.0 compared to 15.0 in March of 2013. She does not require treatment. We did spend some time however discussing her hemoglobin. In March she had a hemoglobin of 11.5. A few days ago on 07/19/2011, the patient's hemoglobin was noted to be 10.5. In the past she was on Procrit every 5-6 weeks. She was a good responder to Procrit intervention. This was discontinued due to her  increasing hemoglobin. We have been observing and most recently. In light of this decrease, I've decided that we will do a CBC in one month's time. If her hemoglobin continues to drop, we will reinitiate Procrit therapy. We'll then perform subsequent laboratory work in 2 months time in 4 months time. She is in agreement with this plan.  On today's examination I do note bilateral angle of the jaw lymphadenopathy measuring approximately 1 cm in size. At this time, in light of her recent infection, we will treat her with the above-mentioned antibiotic. We will reevaluate her adenopathy in followup.   The patient was able to go to Dr. Margo Aye, dermatology, for a lesion appreciated near her sternum on the left side. This has been removed. I do not have the pathology readily available at this time.  Past Medical History  Diagnosis Date  . Sick sinus syndrome 11/2006    paroxysmal atrial fibrillation; dual-chamber pacemaker in 11/2006  . Aortic stenosis     mild  . Left bundle branch block   . Congestive heart failure     Congestive heart failure with normal ejection fraction  . Arteriosclerotic cardiovascular disease (ASCVD)     coronary angio in 10/09:50% left anterior descending; 80% PDA; medical therapy advisedOrthostatic decrease in blood pressure  . Epistaxis     mild with negative ENT evaluation  . Chronic kidney disease      Creatinine of 1.7 in 8/08, 1.4 in 10/08 and 1.5 in 11/09  .  Dizziness     chronic  . Osteoarthritis   . Irritable bowel syndrome      adenoma of the small bowel  . Urinary frequency     with incontinence  . Gastroesophageal reflux disease     diverticulosis; adenoma of the small bowel; EGD with dilatation of a Schatzki's ring  . Rosacea   . Asthma     Childhood asthma  . Chronic lymphocytic leukemia     Stage I with anemia H./H. of 9/27 in 11/09 with normal MCV; iron deficiency in bone marrow  . Orthostatic hypotension   . Chronic anticoagulation     has  HYPERLIPIDEMIA; HYPERTENSION; Arteriosclerotic cardiovascular disease (ASCVD); Chronic anticoagulation; Sick sinus syndrome; Aortic stenosis; Left bundle branch block; Congestive heart failure; Chronic kidney disease; Gastroesophageal reflux disease; and Chronic lymphocytic leukemia on her problem list.     is allergic to detrol; sanctura; sulfa antibiotics; and sulfonamide derivatives.  Ms. Speth had no medications administered during this visit.  Past Surgical History  Procedure Date  . Cholecystectomy   . Cervical spine surgery   . Lumbar disc surgery   . Bladder suspension   . Abdominal hysterectomy 1972  . A-v cardiac pacemaker insertion 11/2006    [employer]  . Cataract extraction     Bilateral  . Esophagogastroduodenoscopy     + dilatation of a Schatzki's ring    Denies any headaches, dizziness, double vision, fevers, chills, night sweats, nausea, vomiting, diarrhea, constipation, chest pain, heart palpitations, shortness of breath, blood in stool, black tarry stool, urinary pain, urinary burning, urinary frequency, hematuria.   PHYSICAL EXAMINATION  ECOG PERFORMANCE STATUS: 1 - Symptomatic but completely ambulatory  Filed Vitals:   07/22/11 0955  BP: 125/67  Pulse: 76  Temp: 97.5 F (36.4 C)    GENERAL:alert, no distress, well nourished, well developed, comfortable, cooperative, smiling and appears younger than stated age. SKIN: skin color, texture, turgor are normal, no rashes or significant lesions HEAD: Normocephalic, No masses, lesions, tenderness or abnormalities EYES: normal, EOMI, Conjunctiva are pink and non-injected EARS: External ears normal OROPHARYNX:lips, buccal mucosa, and tongue normal and mucous membranes are moist  NECK: supple, trachea midline, bilateral angle of the jaw lymphadenopathy appreciated measuring approximately 1 cm in size. LYMPH:  no hepatosplenomegaly BREAST:not examined LUNGS: clear to auscultation and percussion HEART: regular rate &  rhythm, no murmurs, no gallops, S1 normal and S2 normal ABDOMEN:abdomen soft, non-tender, normal bowel sounds, no masses or organomegaly, difficult to assess for hepatosplenomegaly due to body habitus and no hepatosplenomegaly BACK: Back symmetric, no curvature., No CVA tenderness EXTREMITIES:less then 2 second capillary refill, no joint deformities, effusion, or inflammation, no edema, no skin discoloration, no clubbing, no cyanosis  NEURO: alert & oriented x 3 with fluent speech, no focal motor/sensory deficits, gait normal    LABORATORY DATA:  Results for TYKIERA, RAVEN (MRN [id number]) as of 07/22/2011 10:53  Ref. Range 06/09/2011 14:00 06/18/2011 15:45 07/09/2011 15:29 07/20/2011 12:32  WBC Latest Range: 4.0-10.5 K/uL 22.4 (H)   22.4 (H)  RBC Latest Range: 3.87-5.11 MIL/uL 3.60 (L)   3.28 (L)  Hemoglobin Latest Range: 12.0-15.0 g/dL 40.9 (L)   81.1 (L)  HCT Latest Range: 36.0-46.0 % 35.7 (L)   32.0 (L)  MCV Latest Range: 78.0-100.0 fL 99.2   97.6  MCH Latest Range: 26.0-34.0 pg 31.9   32.0  MCHC Latest Range: 30.0-36.0 g/dL 91.4   78.2  RDW Latest Range: 11.5-15.5 % 13.3   13.9  Platelets Latest Range: 150-400  K/uL 215   215  Neutrophils Relative Latest Range: 43-77 % 29 (L)   40 (L)  Lymphocytes Relative Latest Range: 12-46 % 67 (H)   54 (H)  Monocytes Relative Latest Range: 3-12 % 3   5  Eosinophils Relative Latest Range: 0-5 % 1   2  Basophils Relative Latest Range: 0-1 % 0   0  NEUT# Latest Range: 1.7-7.7 K/uL 6.5   8.9 (H)  Lymphocytes Absolute Latest Range: 0.7-4.0 K/uL 15.0 (H)   12.0 (H)  Monocytes Absolute Latest Range: 0.1-1.0 K/uL 0.7   1.1 (H)  Eosinophils Absolute Latest Range: 0.0-0.7 K/uL 0.2   0.4  Basophils Absolute Latest Range: 0.0-0.1 K/uL 0.0   0.1       ASSESSMENT:  1. Chronic lymphocytic leukemia, stage I, not in need of therapy.  2. Anemia, multifactorial with both iron deficiency as well as an element of anemia of chronic disease.  3. Atrial fibrillation on  Coumadin, status post pacemaker insertion 11/18/2006.  4. Left hip pain, status post hip injections x2 by Dr. Naaman Plummer and Dr. Lajoyce Corners with tremendous improvement for 5 months and then her hip started bothering her around the end of November close to Thanksgiving. It has been getting progressively worse and she is to see Dr. Alvester Morin again soon hopefully.  5. Mild renal insufficiency, which is stable.  6. Mild chronic shortness of breath with a history of asthma as a child that was severe.  7. Cervical spine fusion in the past.  8. History of herpes zoster in the past.  9. Lesion on the left upper chest wall consistent with a possible basal cell carcinoma. S/P removal by Dr. Margo Aye, Dermatologist.  10.Hysterectomy in [phone] for benign disease.  11.Hypertension.  12.Dehydration in the past.  13.Cholecystectomy in the past.  14.Hyperkalemia in the past.   PLAN:  1. I personally reviewed and went over laboratory results with the patient. 2. Lab work in 1 month, 2 months, 4 months: CBC diff 3. Will re-initiate Procrit if needed pending future lab work.  Will administer every 4-6 weeks.  4. Augmentin 875/125 BID #20 for cough.  Encouraged to take with food.  5. Patient education regarding CLL. 6. Return in 4 months for follow-up.   All questions were answered. The patient knows to call the clinic with any problems, questions or concerns. We can certainly see the patient much sooner if necessary.  The patient and plan discussed with Glenford Peers, MD and he is in agreement with the aforementioned.   Ghada Abbett

## 2011-07-22 NOTE — Patient Instructions (Signed)
[name]  [id number] [birth year] Dr. Glenford Peers   [facility]  Discharge Instructions  RECOMMENDATIONS MADE BY THE CONSULTANT AND ANY TEST RESULTS WILL BE SENT TO YOUR REFERRING DOCTOR.   EXAM FINDINGS BY MD TODAY AND SIGNS AND SYMPTOMS TO REPORT TO CLINIC OR PRIMARY MD: Exam and discussion per PA.  MEDICATIONS PRESCRIBED: Augmentin was e-scribed Follow label directions  INSTRUCTIONS GIVEN AND DISCUSSED: Other :  Report increased fatigue, shortness of breath, etc.  SPECIAL INSTRUCTIONS/FOLLOW-UP: Lab work Needed in 1, 2 and 4 months and Return to Clinic for follow-up after labs in 4 months.   I acknowledge that I have been informed and understand all the instructions given to me and received a copy. I do not have any more questions at this time, but understand that I may call the Specialty Clinic at [facility] at [phone] during business hours should I have any further questions or need assistance in obtaining follow-up care.    __________________________________________  _____________  __________ Signature of Patient or Authorized Representative            Date                   Time    __________________________________________ Nurse's Signature

## 2011-08-06 ENCOUNTER — Ambulatory Visit (INDEPENDENT_AMBULATORY_CARE_PROVIDER_SITE_OTHER): Payer: Medicare Other | Admitting: *Deleted

## 2011-08-06 DIAGNOSIS — Z7901 Long term (current) use of anticoagulants: Secondary | ICD-10-CM

## 2011-08-21 ENCOUNTER — Encounter (HOSPITAL_COMMUNITY): Payer: Medicare Other | Attending: Oncology

## 2011-08-21 ENCOUNTER — Other Ambulatory Visit (HOSPITAL_COMMUNITY): Payer: Self-pay | Admitting: Oncology

## 2011-08-21 DIAGNOSIS — C9110 Chronic lymphocytic leukemia of B-cell type not having achieved remission: Secondary | ICD-10-CM | POA: Insufficient documentation

## 2011-08-21 DIAGNOSIS — N644 Mastodynia: Secondary | ICD-10-CM

## 2011-08-21 LAB — DIFFERENTIAL
Basophils Absolute: 0.1 10*3/uL (ref 0.0–0.1)
Basophils Relative: 0 % (ref 0–1)
Eosinophils Absolute: 0.6 10*3/uL (ref 0.0–0.7)
Eosinophils Relative: 3 % (ref 0–5)
Monocytes Absolute: 0.8 10*3/uL (ref 0.1–1.0)

## 2011-08-21 LAB — CBC
HCT: 33.4 % — ABNORMAL LOW (ref 36.0–46.0)
MCH: 31.5 pg (ref 26.0–34.0)
MCHC: 32.3 g/dL (ref 30.0–36.0)
MCV: 97.4 fL (ref 78.0–100.0)
RDW: 13.4 % (ref 11.5–15.5)

## 2011-08-21 NOTE — Progress Notes (Signed)
[name] presented for [employer]. Labs per MD order drawn via Peripheral Line 24 gauge needle inserted in rt ac  Good blood return present. Procedure without incident.  Needle removed intact. Patient tolerated procedure well.

## 2011-08-27 ENCOUNTER — Ambulatory Visit (HOSPITAL_COMMUNITY)
Admission: RE | Admit: 2011-08-27 | Discharge: 2011-08-27 | Disposition: A | Discharge status: Home / Self Care | Payer: Medicare Other | Source: Ambulatory Visit | Attending: Pulmonary Disease | Admitting: Pulmonary Disease

## 2011-08-27 ENCOUNTER — Other Ambulatory Visit (HOSPITAL_COMMUNITY): Payer: Self-pay | Admitting: Pulmonary Disease

## 2011-08-27 DIAGNOSIS — R0602 Shortness of breath: Secondary | ICD-10-CM

## 2011-09-02 ENCOUNTER — Other Ambulatory Visit (HOSPITAL_COMMUNITY): Payer: Self-pay | Admitting: Oncology

## 2011-09-02 ENCOUNTER — Ambulatory Visit (HOSPITAL_COMMUNITY)
Admission: RE | Admit: 2011-09-02 | Discharge: 2011-09-02 | Disposition: A | Discharge status: Home / Self Care | Payer: Medicare Other | Source: Ambulatory Visit | Attending: Oncology | Admitting: Oncology

## 2011-09-02 DIAGNOSIS — N644 Mastodynia: Secondary | ICD-10-CM

## 2011-09-03 ENCOUNTER — Ambulatory Visit (INDEPENDENT_AMBULATORY_CARE_PROVIDER_SITE_OTHER): Payer: Medicare Other | Admitting: *Deleted

## 2011-09-03 ENCOUNTER — Other Ambulatory Visit: Payer: Self-pay | Admitting: *Deleted

## 2011-09-03 DIAGNOSIS — Z7901 Long term (current) use of anticoagulants: Secondary | ICD-10-CM

## 2011-09-03 DIAGNOSIS — I2510 Atherosclerotic heart disease of native coronary artery without angina pectoris: Secondary | ICD-10-CM

## 2011-09-03 DIAGNOSIS — I10 Essential (primary) hypertension: Secondary | ICD-10-CM

## 2011-09-03 MED ORDER — VERAPAMIL HCL ER 180 MG PO TBCR: 180.0000 mg | EXTENDED_RELEASE_TABLET | Freq: Every day | ORAL | Status: DC

## 2011-09-03 MED ORDER — METOPROLOL TARTRATE 25 MG PO TABS: 25.0000 mg | ORAL_TABLET | Freq: Two times a day (BID) | ORAL | Status: DC

## 2011-09-03 MED ORDER — LISINOPRIL 10 MG PO TABS: 10.0000 mg | ORAL_TABLET | Freq: Every day | ORAL | Status: DC

## 2011-09-03 MED ORDER — WARFARIN SODIUM 2.5 MG PO TABS: 2.5000 mg | ORAL_TABLET | ORAL | Status: DC

## 2011-09-13 ENCOUNTER — Inpatient Hospital Stay (HOSPITAL_COMMUNITY)
Admission: EM | Admit: 2011-09-13 | Discharge: 2011-09-16 | DRG: 123 | Disposition: A | Discharge status: Home / Self Care | Payer: Medicare Other | Attending: Pulmonary Disease | Admitting: Pulmonary Disease

## 2011-09-13 ENCOUNTER — Emergency Department (HOSPITAL_COMMUNITY): Payer: Medicare Other

## 2011-09-13 ENCOUNTER — Encounter (HOSPITAL_COMMUNITY): Payer: Self-pay | Admitting: Emergency Medicine

## 2011-09-13 DIAGNOSIS — I129 Hypertensive chronic kidney disease with stage 1 through stage 4 chronic kidney disease, or unspecified chronic kidney disease: Secondary | ICD-10-CM | POA: Diagnosis present

## 2011-09-13 DIAGNOSIS — R0600 Dyspnea, unspecified: Secondary | ICD-10-CM

## 2011-09-13 DIAGNOSIS — K219 Gastro-esophageal reflux disease without esophagitis: Secondary | ICD-10-CM

## 2011-09-13 DIAGNOSIS — K225 Diverticulum of esophagus, acquired: Secondary | ICD-10-CM | POA: Diagnosis present

## 2011-09-13 DIAGNOSIS — N390 Urinary tract infection, site not specified: Secondary | ICD-10-CM

## 2011-09-13 DIAGNOSIS — R1310 Dysphagia, unspecified: Secondary | ICD-10-CM | POA: Diagnosis present

## 2011-09-13 DIAGNOSIS — A498 Other bacterial infections of unspecified site: Secondary | ICD-10-CM | POA: Diagnosis present

## 2011-09-13 DIAGNOSIS — Z8673 Personal history of transient ischemic attack (TIA), and cerebral infarction without residual deficits: Secondary | ICD-10-CM

## 2011-09-13 DIAGNOSIS — C9110 Chronic lymphocytic leukemia of B-cell type not having achieved remission: Secondary | ICD-10-CM | POA: Diagnosis present

## 2011-09-13 DIAGNOSIS — I4891 Unspecified atrial fibrillation: Secondary | ICD-10-CM

## 2011-09-13 DIAGNOSIS — I10 Essential (primary) hypertension: Secondary | ICD-10-CM

## 2011-09-13 DIAGNOSIS — Z79899 Other long term (current) drug therapy: Secondary | ICD-10-CM

## 2011-09-13 DIAGNOSIS — K589 Irritable bowel syndrome without diarrhea: Secondary | ICD-10-CM | POA: Diagnosis present

## 2011-09-13 DIAGNOSIS — I480 Paroxysmal atrial fibrillation: Secondary | ICD-10-CM

## 2011-09-13 DIAGNOSIS — Z8249 Family history of ischemic heart disease and other diseases of the circulatory system: Secondary | ICD-10-CM

## 2011-09-13 DIAGNOSIS — I359 Nonrheumatic aortic valve disorder, unspecified: Secondary | ICD-10-CM | POA: Diagnosis present

## 2011-09-13 DIAGNOSIS — I447 Left bundle-branch block, unspecified: Secondary | ICD-10-CM | POA: Diagnosis present

## 2011-09-13 DIAGNOSIS — I509 Heart failure, unspecified: Secondary | ICD-10-CM

## 2011-09-13 DIAGNOSIS — E785 Hyperlipidemia, unspecified: Secondary | ICD-10-CM | POA: Diagnosis present

## 2011-09-13 DIAGNOSIS — Z882 Allergy status to sulfonamides status: Secondary | ICD-10-CM

## 2011-09-13 DIAGNOSIS — I2510 Atherosclerotic heart disease of native coronary artery without angina pectoris: Secondary | ICD-10-CM

## 2011-09-13 DIAGNOSIS — D1330 Benign neoplasm of unspecified part of small intestine: Secondary | ICD-10-CM | POA: Diagnosis present

## 2011-09-13 DIAGNOSIS — N189 Chronic kidney disease, unspecified: Secondary | ICD-10-CM | POA: Diagnosis present

## 2011-09-13 DIAGNOSIS — I495 Sick sinus syndrome: Secondary | ICD-10-CM

## 2011-09-13 DIAGNOSIS — M1990 Unspecified osteoarthritis, unspecified site: Secondary | ICD-10-CM | POA: Diagnosis present

## 2011-09-13 DIAGNOSIS — Z806 Family history of leukemia: Secondary | ICD-10-CM

## 2011-09-13 DIAGNOSIS — L719 Rosacea, unspecified: Secondary | ICD-10-CM | POA: Diagnosis present

## 2011-09-13 DIAGNOSIS — G453 Amaurosis fugax: Secondary | ICD-10-CM

## 2011-09-13 DIAGNOSIS — R55 Syncope and collapse: Secondary | ICD-10-CM

## 2011-09-13 DIAGNOSIS — Z800 Family history of malignant neoplasm of digestive organs: Secondary | ICD-10-CM

## 2011-09-13 DIAGNOSIS — Z801 Family history of malignant neoplasm of trachea, bronchus and lung: Secondary | ICD-10-CM

## 2011-09-13 DIAGNOSIS — H3400 Transient retinal artery occlusion, unspecified eye: Principal | ICD-10-CM | POA: Diagnosis present

## 2011-09-13 DIAGNOSIS — K222 Esophageal obstruction: Secondary | ICD-10-CM | POA: Diagnosis present

## 2011-09-13 DIAGNOSIS — Z950 Presence of cardiac pacemaker: Secondary | ICD-10-CM

## 2011-09-13 DIAGNOSIS — H53129 Transient visual loss, unspecified eye: Secondary | ICD-10-CM | POA: Diagnosis present

## 2011-09-13 DIAGNOSIS — I350 Nonrheumatic aortic (valve) stenosis: Secondary | ICD-10-CM | POA: Diagnosis present

## 2011-09-13 DIAGNOSIS — Z7901 Long term (current) use of anticoagulants: Secondary | ICD-10-CM

## 2011-09-13 HISTORY — DX: Benign neoplasm, unspecified site: D36.9

## 2011-09-13 HISTORY — DX: Diverticulosis of intestine, part unspecified, without perforation or abscess without bleeding: K57.90

## 2011-09-13 LAB — COMPREHENSIVE METABOLIC PANEL
ALT: 13 U/L (ref 0–35)
AST: 21 U/L (ref 0–37)
Alkaline Phosphatase: 99 U/L (ref 39–117)
CO2: 26 mEq/L (ref 19–32)
Chloride: 104 mEq/L (ref 96–112)
GFR calc Af Amer: 48 mL/min — ABNORMAL LOW (ref >90)
GFR calc non Af Amer: 41 mL/min — ABNORMAL LOW (ref >90)
Glucose, Bld: 148 mg/dL — ABNORMAL HIGH (ref 70–99)
Potassium: 3.8 mEq/L (ref 3.5–5.1)
Sodium: 140 mEq/L (ref 135–145)
Total Bilirubin: 0.3 mg/dL (ref 0.3–1.2)

## 2011-09-13 LAB — URINE MICROSCOPIC-ADD ON

## 2011-09-13 LAB — PROTIME-INR: INR: 2.75 — ABNORMAL HIGH (ref 0.00–1.49)

## 2011-09-13 LAB — CBC WITH DIFFERENTIAL/PLATELET
Eosinophils Absolute: 0.6 10*3/uL (ref 0.0–0.7)
Hemoglobin: 11.1 g/dL — ABNORMAL LOW (ref 12.0–15.0)
Lymphocytes Relative: 53 % — ABNORMAL HIGH (ref 12–46)
Lymphs Abs: 13.6 10*3/uL — ABNORMAL HIGH (ref 0.7–4.0)
MCH: 31.8 pg (ref 26.0–34.0)
Monocytes Relative: 3 % (ref 3–12)
Neutro Abs: 10.5 10*3/uL — ABNORMAL HIGH (ref 1.7–7.7)
Neutrophils Relative %: 41 % — ABNORMAL LOW (ref 43–77)
RBC: 3.49 MIL/uL — ABNORMAL LOW (ref 3.87–5.11)
WBC: 25.6 10*3/uL — ABNORMAL HIGH (ref 4.0–10.5)

## 2011-09-13 LAB — URINALYSIS, ROUTINE W REFLEX MICROSCOPIC
Glucose, UA: NEGATIVE mg/dL
Hgb urine dipstick: NEGATIVE
Ketones, ur: NEGATIVE mg/dL
Protein, ur: NEGATIVE mg/dL
pH: 5.5 (ref 5.0–8.0)

## 2011-09-13 MED ORDER — ACETAMINOPHEN 500 MG PO TABS
1000.0000 mg | ORAL_TABLET | Freq: Four times a day (QID) | ORAL | Status: DC
  Administered 2011-09-13 – 2011-09-16 (×11): 1000 mg via ORAL
  Filled 2011-09-13 (×10): qty 2

## 2011-09-13 MED ORDER — ACETAMINOPHEN 500 MG PO TABS
ORAL_TABLET | ORAL | Status: AC
  Filled 2011-09-13: qty 2

## 2011-09-13 MED ORDER — METOPROLOL TARTRATE 25 MG PO TABS
25.0000 mg | ORAL_TABLET | Freq: Two times a day (BID) | ORAL | Status: DC
  Administered 2011-09-13 – 2011-09-15 (×6): 25 mg via ORAL
  Filled 2011-09-13 (×7): qty 1

## 2011-09-13 MED ORDER — VERAPAMIL HCL ER 180 MG PO TBCR
180.0000 mg | EXTENDED_RELEASE_TABLET | Freq: Every day | ORAL | Status: DC
  Administered 2011-09-13 – 2011-09-15 (×3): 180 mg via ORAL
  Filled 2011-09-13 (×3): qty 1

## 2011-09-13 MED ORDER — NITROGLYCERIN 2 % TD OINT
1.0000 [in_us] | TOPICAL_OINTMENT | Freq: Once | TRANSDERMAL | Status: AC
  Administered 2011-09-13: 1 [in_us] via TOPICAL
  Filled 2011-09-13: qty 1

## 2011-09-13 MED ORDER — LISINOPRIL 10 MG PO TABS
10.0000 mg | ORAL_TABLET | Freq: Every day | ORAL | Status: DC
  Administered 2011-09-13 – 2011-09-15 (×3): 10 mg via ORAL
  Filled 2011-09-13 (×4): qty 1

## 2011-09-13 MED ORDER — LOPERAMIDE HCL 2 MG PO CAPS
2.0000 mg | ORAL_CAPSULE | ORAL | Status: DC | PRN
  Administered 2011-09-13 – 2011-09-15 (×2): 2 mg via ORAL
  Filled 2011-09-13 (×2): qty 1

## 2011-09-13 MED ORDER — WARFARIN SODIUM 2.5 MG PO TABS
2.5000 mg | ORAL_TABLET | Freq: Every day | ORAL | Status: DC
  Administered 2011-09-13 – 2011-09-14 (×2): 2.5 mg via ORAL
  Filled 2011-09-13 (×3): qty 1

## 2011-09-13 MED ORDER — FUROSEMIDE 20 MG PO TABS
20.0000 mg | ORAL_TABLET | Freq: Every day | ORAL | Status: DC
  Administered 2011-09-13 – 2011-09-16 (×4): 20 mg via ORAL
  Filled 2011-09-13 (×4): qty 1

## 2011-09-13 MED ORDER — CEPHALEXIN 500 MG PO CAPS
500.0000 mg | ORAL_CAPSULE | Freq: Three times a day (TID) | ORAL | Status: DC
  Administered 2011-09-13 – 2011-09-16 (×9): 500 mg via ORAL
  Filled 2011-09-13 (×10): qty 1

## 2011-09-13 MED ORDER — WARFARIN - PHYSICIAN DOSING INPATIENT
Freq: Every day | Status: DC
  Administered 2011-09-13 – 2011-09-14 (×2)

## 2011-09-13 NOTE — ED Notes (Signed)
Patient reports taking lasix in morning but states that she takes her lopressor and lisinopril at lunch around 1230 and coumadin at night before bed. EDP made aware. Per EDP patient can take medications at the times she takes them at home. Pharmacy notified, times to be changed by [facility].

## 2011-09-13 NOTE — ED Provider Notes (Signed)
History   This chart was scribed for Ward Givens, MD by Sofie Rower. The patient was seen in room [facility] and the patient's care was started at 7:49 AM      CSN: [id number]  Arrival date & time 09/13/11  5956   First MD Initiated Contact with Patient 09/13/11 0745      Chief Complaint  Patient presents with  . Near Syncope  . Chest Pain    (Consider location/radiation/quality/duration/timing/severity/associated sxs/prior treatment) The history is provided by the nursing home.    [name] is a 78 y.o. female who presents to the Emergency Department complaining of near syncope onset today with associated symptoms of difficulty breathing, visual changes, chest tightness. The pt informs the EDP that she could not sleep, got up about 5:30 am and was on the computer about 1 hour. When she stood up she felt sick, like she was going to faint. The pt reports the  episode lasted around 15 minutes. The pt had visual change seeing a large black spot which is now gone that she has started having a few times in the past few weeks.  The pt informs the EDP that she still feels short of breath at present although improved. The pt is due to have her pacemaker checked next week. Pt reports her CLL is doing well.The pt uses a walker/cane on limited occasions when she visits the grocery or goes to the mailbox. Pt has a hx of visual changes, fainting, atrial fibrillation for which she is taking taking coumadin, pacemaker placement (September, 2008), mammogram recently for right breast pain and recent CXR which showed fluid overload (one month ago). She also had swelling in her legs and was advised to increase her lasix which she did and now she is only taking it once a day.    Pt has a familial hx of MI (father), lung cancer (father), diabetes (mother).   Pt denies chest pain, nausea, spinning, headache,vomiting, wheezing, swelling in the legs. The pt does not use oxygen in the home.   PCP is Dr.  Juanetta Gosling.  Cardiology Dr. Dietrich Pates.   Past Medical History  Diagnosis Date  . Sick sinus syndrome 11/2006    paroxysmal atrial fibrillation; dual-chamber pacemaker in 11/2006  . Aortic stenosis     mild  . Left bundle branch block   . Congestive heart failure     Congestive heart failure with normal ejection fraction  . Arteriosclerotic cardiovascular disease (ASCVD)     coronary angio in 10/09:50% left anterior descending; 80% PDA; medical therapy advisedOrthostatic decrease in blood pressure  . Epistaxis     mild with negative ENT evaluation  . Chronic kidney disease      Creatinine of 1.7 in 8/08, 1.4 in 10/08 and 1.5 in 11/09  . Dizziness     chronic  . Osteoarthritis   . Irritable bowel syndrome      adenoma of the small bowel  . Urinary frequency     with incontinence  . Gastroesophageal reflux disease     diverticulosis; adenoma of the small bowel; EGD with dilatation of a Schatzki's ring  . Rosacea   . Asthma     Childhood asthma  . Chronic lymphocytic leukemia     Stage I with anemia H./H. of 9/27 in 11/09 with normal MCV; iron deficiency in bone marrow  . Orthostatic hypotension   . Chronic anticoagulation     Past Surgical History  Procedure Date  . Cholecystectomy   .  Cervical spine surgery   . Lumbar disc surgery   . Bladder suspension   . Abdominal hysterectomy 1972  . A-v cardiac pacemaker insertion 11/2006    [employer]tronic  . Cataract extraction     Bilateral  . Esophagogastroduodenoscopy     + dilatation of a Schatzki's ring      History  Substance Use Topics  . Smoking status: Never Smoker   . Smokeless tobacco: Never Used  . Alcohol Use: No  PT lives alone Pt does not smoke, pt does not drink.  Uses cane or walker sometimes  OB History    Grav Para Term Preterm Abortions TAB SAB Ect Mult Living   2 2 2       2       Review of Systems  All other systems reviewed and are negative.    10 Systems reviewed and all are negative for acute  change except as noted in the HPI.    Allergies  Detrol; Sanctura; Sulfa antibiotics; and Sulfonamide derivatives  Home Medications   Current Outpatient Rx  Name Route Sig Dispense Refill  . ACETAMINOPHEN 500 MG PO TABS Oral Take 1,000 mg by mouth 4 (four) times daily.      [name] CALCIUM CARBONATE-VITAMIN D 500-200 MG-UNIT PO TABS Oral Take 1 tablet by mouth daily.      . EPOETIN ALFA 16109 UNIT/ML IJ SOLN Subcutaneous Inject into the skin.      . FUROSEMIDE 20 MG PO TABS Oral Take 1 tablet (20 mg total) by mouth as needed. 15 tablet 6  . LISINOPRIL 10 MG PO TABS Oral Take 1 tablet (10 mg total) by mouth daily. 90 tablet 3    Pt. Is due for OV please call for office visit  . METOPROLOL TARTRATE 25 MG PO TABS Oral Take 1 tablet (25 mg total) by mouth 2 (two) times daily. 180 tablet 3    Please call for office visit  . MULTI-VITAMIN/MINERALS PO TABS Oral Take 1 tablet by mouth daily.      [name] OMEPRAZOLE 20 MG PO CPDR Oral Take 20 mg by mouth as needed.     [name] PRAVASTATIN SODIUM 40 MG PO TABS Oral Take 40 mg by mouth daily.      [name] VERAPAMIL HCL ER 180 MG PO TBCR Oral Take 1 tablet (180 mg total) by mouth at bedtime. 90 tablet 3  . WARFARIN SODIUM 2.5 MG PO TABS Oral Take 1 tablet (2.5 mg total) by mouth as directed. 90 tablet 3    BP 158/51  Pulse 76  Temp 98.3 F (36.8 C) (Oral)  Resp 24  Ht 5' (1.524 m)  Wt 160 lb (72.576 kg)  BMI 31.25 kg/m2  SpO2 98%  Vital signs normal   Physical Exam  Nursing note and vitals reviewed. Constitutional: She is oriented to person, place, and time. She appears well-developed and well-nourished.  Non-toxic appearance. She does not appear ill. No distress.  HENT:  Head: Normocephalic and atraumatic.  Right Ear: External ear normal.  Left Ear: External ear normal.  Nose: Nose normal. No mucosal edema or rhinorrhea.  Mouth/Throat: Oropharynx is clear and moist and mucous membranes are normal. No dental abscesses or uvula swelling.  Eyes:  Conjunctivae and EOM are normal. Pupils are equal, round, and reactive to light.  Neck: Normal range of motion and full passive range of motion without pain. Neck supple. Carotid bruit is not present.  Cardiovascular: Normal rate and regular rhythm.  Exam reveals no gallop  and no friction rub.   Murmur (Harsh systolic murmur heard over percordian.) heard. Pulmonary/Chest: Effort normal and breath sounds normal. No respiratory distress. She has no wheezes. She has no rhonchi. She has no rales. She exhibits no tenderness and no crepitus.  Abdominal: Soft. Normal appearance and bowel sounds are normal. She exhibits no distension. There is no tenderness. There is no rebound and no guarding.  Musculoskeletal: Normal range of motion. She exhibits no edema and no tenderness.       Trace edema. Superficiale varicose veins of both feet.   Neurological: She is alert and oriented to person, place, and time. She has normal strength. No cranial nerve deficit.       Mild visual field cut of the lower peripheral field on both sides.   Skin: Skin is warm, dry and intact. No rash noted. No erythema. No pallor.  Psychiatric: She has a normal mood and affect. Her speech is normal and behavior is normal. Her mood appears not anxious.    ED Course  Procedures (including critical care time)   Medications  cephALEXin (KEFLEX) capsule 500 mg (not administered)  nitroGLYCERIN (NITROGLYN) 2 % ointment 1 inch (1 inch Topical Given 09/13/11 0825)     08:40 Med[employer] pacemaker interrogation performed by nursing staff and shows last episode of tachycardia was on 6/3 and lasted 5 seconds, no other events recorded  DIAGNOSTIC STUDIES: Oxygen Saturation is 98% on room air, normal by my interpretation.    COORDINATION OF CARE:  8:09AM- EDP at bedside discusses treatment plan concerning chest x-ray, blood work, cat-scan. EDP discusses visual changes with patient.    10:05 Dr Sudie Bailey, will admit to tele for Dr  Juanetta Gosling  11:01AM- EDP at bedside, Pt reports she is feeling better.     Results for orders placed during the hospital encounter of 09/13/11  CBC WITH DIFFERENTIAL      Component Value Range   WBC 25.6 (*) 4.0 - 10.5 K/uL   RBC 3.49 (*) 3.87 - 5.11 MIL/uL   Hemoglobin 11.1 (*) 12.0 - 15.0 g/dL   HCT 16.1 (*) 09.6 - 04.5 %   MCV 98.6  78.0 - 100.0 fL   MCH 31.8  26.0 - 34.0 pg   MCHC 32.3  30.0 - 36.0 g/dL   RDW 40.9  81.1 - 91.4 %   Platelets 227  150 - 400 K/uL   Neutrophils Relative 41 (*) 43 - 77 %   Neutro Abs 10.5 (*) 1.7 - 7.7 K/uL   Lymphocytes Relative 53 (*) 12 - 46 %   Lymphs Abs 13.6 (*) 0.7 - 4.0 K/uL   Monocytes Relative 3  3 - 12 %   Monocytes Absolute 0.8  0.1 - 1.0 K/uL   Eosinophils Relative 2  0 - 5 %   Eosinophils Absolute 0.6  0.0 - 0.7 K/uL   Basophils Relative 0  0 - 1 %   Basophils Absolute 0.1  0.0 - 0.1 K/uL   WBC Morphology WHITE COUNT CONFIRMED ON SMEAR    COMPREHENSIVE METABOLIC PANEL      Component Value Range   Sodium 140  135 - 145 mEq/L   Potassium 3.8  3.5 - 5.1 mEq/L   Chloride 104  96 - 112 mEq/L   CO2 26  19 - 32 mEq/L   Glucose, Bld 148 (*) 70 - 99 mg/dL   BUN 31 (*) 6 - 23 mg/dL   Creatinine, Ser 7.82 (*) 0.50 - 1.10 mg/dL   Calcium  9.6  8.4 - 10.5 mg/dL   Total Protein 6.8  6.0 - 8.3 g/dL   Albumin 3.7  3.5 - 5.2 g/dL   AST 21  0 - 37 U/L   ALT 13  0 - 35 U/L   Alkaline Phosphatase 99  39 - 117 U/L   Total Bilirubin 0.3  0.3 - 1.2 mg/dL   GFR calc non Af Amer 41 (*) >90 mL/min   GFR calc Af Amer 48 (*) >90 mL/min  TROPONIN I      Component Value Range   Troponin I <0.30  <0.30 ng/mL  PRO B NATRIURETIC PEPTIDE      Component Value Range   Pro B Natriuretic peptide (BNP) 580.6 (*) 0 - 450 pg/mL  URINALYSIS, ROUTINE W REFLEX MICROSCOPIC      Component Value Range   Color, Urine YELLOW  YELLOW   APPearance CLEAR  CLEAR   Specific Gravity, Urine 1.020  1.005 - 1.030   pH 5.5  5.0 - 8.0   Glucose, UA NEGATIVE  NEGATIVE mg/dL    Hgb urine dipstick NEGATIVE  NEGATIVE   Bilirubin Urine NEGATIVE  NEGATIVE   Ketones, ur NEGATIVE  NEGATIVE mg/dL   Protein, ur NEGATIVE  NEGATIVE mg/dL   Urobilinogen, UA 0.2  0.0 - 1.0 mg/dL   Nitrite POSITIVE (*) NEGATIVE   Leukocytes, UA TRACE (*) NEGATIVE  APTT      Component Value Range   aPTT 44 (*) 24 - 37 seconds  PROTIME-INR      Component Value Range   Prothrombin Time 29.5 (*) 11.6 - 15.2 seconds   INR 2.75 (*) 0.00 - 1.49  URINE MICROSCOPIC-ADD ON      Component Value Range   Squamous Epithelial / LPF RARE  RARE   WBC, UA 3-6  <3 WBC/hpf   RBC / HPF 0-2  <3 RBC/hpf   Bacteria, UA MANY (*) RARE   Laboratory interpretation all normal except therapeuic INR, poss uti, stable anemia and stable elevated WBC c/w CLL, BNP only slightly higher than baseline   Ct Head Wo Contrast  09/13/2011  *RADIOLOGY REPORT*  Clinical Data: Near syncope.  Chest pain.  Visual changes.  CT HEAD WITHOUT CONTRAST  Technique:  Contiguous axial images were obtained from the base of the skull through the vertex without contrast.  Comparison: Head CT 04/03/2003.  Brain MRI 11/16/2006.  Findings: A well-defined focus of low attenuation in the right cerebellar hemisphere is unchanged, compatible with an old cerebellar infarct.  There is mild cerebral and cerebellar atrophy which is age appropriate.  There are patchy and confluent areas of decreased attenuation throughout the deep and periventricular white matter of the cerebral hemispheres bilaterally, compatible with chronic microvascular ischemic changes.  No definite acute intracranial abnormality.  Specifically, no definite signs of acute/subacute cerebral ischemia, and no intraparenchymal hemorrhage, no focal mass, mass effect, hydrocephalus or abnormal intra or extra-axial fluid collections.  No acute displaced skull fractures are identified.  The visualized paranasal sinuses and mastoids are well pneumatized, with the exception of a small polypoid  density in the left maxillary sinus which may represent a mucosal retention cyst or polyp.  IMPRESSION: 1.  No acute intracranial abnormalities. 2.  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  Original Report Authenticated By: Florencia Reasons, M.D.     Dg Chest Port 1 View  09/13/2011  *RADIOLOGY REPORT*  Clinical Data: Shortness  of breath, chest pain, syncope  PORTABLE CHEST - 1 VIEW  Comparison: 08/27/2011  Findings: Left subclavian pacemaker stable in position.  Mild cardiomegaly.  Mild bibasilar interstitial prominence without overt interstitial edema.  No focal airspace consolidation.  No effusion.  IMPRESSION: 1.  No acute disease. 2.  Chronic cardiomegaly and bibasilar interstitial changes.  Original Report Authenticated By: Osa Craver, M.D.       Date: 09/13/2011  Rate: 70   Rhythm: Demand pacemaker  QRS Axis: NA  Intervals: NA  ST/T Wave abnormalities: NA  Conduction Disutrbances:NA  Narrative Interpretation:   Old EKG Reviewed: unchanged from 12/19/2007     1. Amaurosis fugax   2. Near syncope   3. Dyspnea   4. Aortic stenosis   5. Unspecified cardiovascular disease   6. Essential hypertension, benign   7. Urinary tract infection    Plan admission   MDM  Pt's near syncopal events and SOB may be related to her AS, her visual changes may be from amaurosis fugax. Pt needs TIA evaluation and her AS reevaluated.    I personally performed the services described in this documentation, which was scribed in my presence. The recorded information has been reviewed and considered.  Devoria Albe, MD, Armando Gang    Ward Givens, MD 09/13/11 [phone]

## 2011-09-13 NOTE — H&P (Signed)
NAMEJACARRA, BOBAK NO.:  [id number]  MEDICAL RECORD NO.:  [id number]  LOCATION:  A315                          FACILITY:  [facility]  PHYSICIAN:  Mila Homer. Sudie Bailey, M.D.DATE OF BIRTH:  02/03/1916  DATE OF ADMISSION:  09/13/2011 DATE OF DISCHARGE:  LH                             HISTORY & PHYSICAL   This 78 year old presented to the [facility] Emergency Room today.  She had woken up about 5:30 and then went to sit at her computer, which she did for about an hour.  She then went to get up and walk to the bathroom and on the way there she got very dizzy and at the same time loss of vision in both eyes and made it to her recliner where she sat down.  She thinks it may have taken her as long as 15 minutes for the vision to return from the eyes.  She has had dizzy spells and weak spells for about 3 years intermittently.  This represents the fifth or sixth one of these.  She is not sure whether they occur with standing from sitting.  She has also had problems with her vision with what she called a "black spot" over both eyes that has happened at least 3 or 4 times in the last 3 months. She said that other times it has been unassociated with standing up or with dizzy spells.  Further description of the black in the vision, the loss of vision involves the entire medial aspect of the right eye, but she is able to see some out of the lateral aspect of the right eye.  It also involves most of the left eye, but she has a little bit of vision laterally in the left eye as well.  The vision may come back within a minute and she just does not know but thinks it is longer than this.  She has no headache with it.  She has had a very long and complicated medical history.  She has paroxysmal atrial fibrillation, for which she is on warfarin.  She is followed by Dr. Dietrich Pates, her cardiologist, for this.  She also has CLL, which she has had at least 4 for years.   She is followed by Dr. Mariel Sleet for this.  Report of her path report on bone marrow biopsy showed 1 diagnosis being CLL/small lymphocytic lymphoma, another diagnosis being B-cell non-Hodgkin lymphoma.  She also has had an adenomatous polyp noted in her duodenum.  She has had at least 3 or 4 EGDs to evaluate this.  One of the first of these done, March 23, 2007, showed what was felt to be a villous adenoma without dysplasia.  She had this recheck, April 29, 2007, and August 22, 2007.  On January 09, 2009, she again had this villous adenoma biopsied and now it showed high-grade dysplasia.  She is seen by Dr. Karilyn Cota, Gastroenterologist, for this.  A pacemakers was placed September, 2008.  She also has had chronic diarrhea.  She has had stool incontinence because of this, at times.  She has to take 2 Imodium before going to dinner to make sure this does not happen.  I do not on how long she has had this.  Other cardiac issues include aortic stenosis.  Other GI issues include what sounds like esophageal stricture.  She has had symptoms of this for 6 months.  She tells me today when she was eating, she was able eat rice and beans fine, but she ate chicken and it got stuck.  This is happened a number of times before as well.  Other issues noted in the electronic record include sick sinus syndrome. (for which she received a pacemaker, September, 2008), left bundle branch block, congestive heart failure, atherosclerotic cardiovascular disease.  She had a normal ejection fraction with the congestive heart failure, but a coronary angiogram done, October, 2009, showed a 50% left anterior descending artery blockage and 80% PDA.  Further, she has had chronic epistaxis.  This generally involves the right nostril.  This has been since she had been on warfarin for chronic atrial fibrillation.  She has being seen by ENT, referred by her primary care doctor, Dr. Juanetta Gosling.  Apparently she has got  an ulcer in the right nostril.  Other issues include orthostatic hypotension, rosacea, urinary frequency, osteoarthritis, chronic dizziness.  Surgery includes a cholecystectomy, cervical spine surgery, lumbar disk surgery, bladder suspension, abdominal hysterectomy in 1972, the AV cardiac pacemaker insertion, September, 2008, cataract extraction, and EGD.  The electronic record mentioned that she did have dilation of a Schatzki ring in the past.  CURRENT MEDICATIONS:  Include: 1. Acetaminophen 1000 mg q.i.d. 2. Calcium with vitamin D 500/200 daily. 3. Multivitamins with minerals daily. 4. Pravastatin 40 mg daily. 5. Furosemide 20 mg by mouth as needed. She has also been on: 1. Furosemide 20 mg daily. 2. Lisinopril 10 mg daily. 3. Metoprolol 25 mg b.i.d. 4. Verapamil long-acting 180 mg at bedtime. 5. Warfarin 2.5 mg daily.  INITIAL PHYSICAL EXAM:  GENERAL:  A pleasant, elderly woman now age 78, apparently takes cares of everything in her home except for heavy cleaning and outside yard work and is still driving.  She appeared to be oriented and alert.  She was in no acute distress when I saw her. Vision had returned.  She was a very good historian. HEART:  A fairly regular rhythm with about a 2/6 systolic ejection murmur. LUNGS:  Absolutely clear throughout and she had good expansion of the thoracic cage.  The pacemaker is noted in the left upper chest. ABDOMEN:  Soft without organomegaly or mass. EXTREMITIES:  She had no edema of the ankles.  She had 2+ dorsalis pedis pulses bilaterally and she had varicosities involving the feet.  She also had at least 1 or 2+ carotid bruits bilaterally, but these may be from referred murmur from the aortic stenosis.  ADMISSION LAB WORK:  A white cell count of 25,600, (with her CLL, her white cell count is usually in the 20,000 range), with 41 neutrophils, 53 lymphs.  Her pro-B-natriuretic peptide was 580.6.  Her CMP showed a BUN 31,  creatinine 1.12 with an estimated calculated GFR of 41, and glucose 148.  Her INR was 2.75.  URINALYSIS showed positive nitrite, trace leukocytes, 3-6 WBCs, 0-2 RBCs, and many bacteria.  A urine culture is pending.  Her admission chest x-ray showed no acute disease or chronic cardiomegaly and bibasilar interstitial changes.  CT of the head showed a well-defined focus of low attenuation in the right cerebellar hemisphere, which is unchanged, compatible with an old cerebellar infarct.  She had cerebral and cerebellar atrophy and chronic microvascular ischemic changes.  She had a small polypoid density in the left maxillary sinus.  Her EKG showed a demand pacemaker with left bundle branch block.  ADMISSION DIAGNOSES:  Include: 1. Transient binocular visual loss with bilateral temporal field cuts,     which could be secondary to either a transient ischemic attack from     her chronic atrial fibrillation or vasospasm.  It is  felt that     this involves the posterior process either at the optic chiasm or     in the visual cortex. 2. Dizziness.  She has had 5 or 6 episodes of this in 3 years.  She     does not recall if the other times she went to stand up  when she     had the attacks.  Today's attack seems consistent with orthostatic     hypotension. 3. Sick sinus syndrome requiring a demand pacemaker insertion,     September of 2008. 4. Aortic stenosis, felt to be mild. 5. Left bundle branch block. 6. Congestive heart failure with a normal ejection fraction. 7. Coronary artery disease, with at least a 50% left anterior     descending blockage noted in October, 2009. 8. Chronic recurrent epistaxis mainly from the right nostril probably     related to a combination of using warfarin for anticoagulation and     an ulcer of the nostril. 9. Chronic kidney disease with a creatinine 1.7, 8/08, 1.4 10/08, and     still somewhat elevated today. 10.Irritable bowel disease, which may be  related to beta blocker use. 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 12.Diverticulosis. 13.Symptoms of esophageal stricture with a past history of Schatzki     ring successfully dilated. 14.Chronic lymphocytic leukemia versus B-cell non-Hodgkin lymphoma. 15.Chronic warfarin anticoagulation. 16.Acne rosacea. 17.Gastroesophageal reflux disease. 18.Benign essential hypertension. As part of the workup, we will get an MRA, MRI, and MRA of the vessels of the head.  We will continue anticoagulation and antihypertensives.  I have discussed all of this with her.  I wonder whether she may have a chronic Staph infection of her right nostril, which is causing chronic inflammation and therefore increasing the risk of bleeding.  I also wonder whether she may have affect of beta blockers on her diarrhea.  I am asking [facility] to see her.  Outpatient, she should also ask Dr. Karilyn Cota to see her given her problem with esophageal stricture.  Note, this encounter with her took over 60 minutes.     Mila Homer. Sudie Bailey, M.D.     SDK/MEDQ  D:  09/13/2011  T:  09/13/2011  Job:  [id number]

## 2011-09-13 NOTE — ED Notes (Signed)
Patient's pacemaker checked with [employer] due to near syncopal episode and chest tightness. Per [employer] [facility] patient's pacemaker working good with no kinks in wires, battery in good working order, patient's heart rate elevated for 5 seconds on June 3rd. EDP made aware.

## 2011-09-13 NOTE — ED Notes (Signed)
Patient walked to the restroom and back to room with minimal assistants.

## 2011-09-13 NOTE — ED Notes (Signed)
Patient is comfortable at this time. 

## 2011-09-13 NOTE — ED Notes (Signed)
Patient reported to EDP about seeing black spots intermittently for weeks.

## 2011-09-13 NOTE — ED Notes (Signed)
Patient does not need anything at this time. 

## 2011-09-13 NOTE — ED Notes (Signed)
MD at bedside. 

## 2011-09-13 NOTE — ED Notes (Signed)
Patient brought in from home via EMS. Alert and oriented. Per patient got dizzy this morning and had near syncopal episode. Patient denies LOC or fall. Patient now c/o non radiating chest tightness/pain. Patient given 324mg  of aspirin and 1 SL nitro via EMS. Patient denies nitro relieving chest tightness. Patient does report nausea and shortness of breath. Denies any weakness. Patient blood sugar was 139 per EMS.

## 2011-09-13 NOTE — ED Notes (Signed)
Patient given warm blanket and graham crackers with peanut butter per request.

## 2011-09-14 ENCOUNTER — Inpatient Hospital Stay (HOSPITAL_COMMUNITY): Payer: Medicare Other

## 2011-09-14 ENCOUNTER — Encounter (HOSPITAL_COMMUNITY): Payer: Self-pay | Admitting: Urgent Care

## 2011-09-14 DIAGNOSIS — R1310 Dysphagia, unspecified: Secondary | ICD-10-CM

## 2011-09-14 DIAGNOSIS — I4891 Unspecified atrial fibrillation: Secondary | ICD-10-CM

## 2011-09-14 DIAGNOSIS — I495 Sick sinus syndrome: Secondary | ICD-10-CM

## 2011-09-14 DIAGNOSIS — Z7901 Long term (current) use of anticoagulants: Secondary | ICD-10-CM

## 2011-09-14 DIAGNOSIS — I480 Paroxysmal atrial fibrillation: Secondary | ICD-10-CM

## 2011-09-14 DIAGNOSIS — H3400 Transient retinal artery occlusion, unspecified eye: Principal | ICD-10-CM

## 2011-09-14 DIAGNOSIS — R12 Heartburn: Secondary | ICD-10-CM

## 2011-09-14 DIAGNOSIS — H53129 Transient visual loss, unspecified eye: Secondary | ICD-10-CM | POA: Diagnosis present

## 2011-09-14 DIAGNOSIS — I359 Nonrheumatic aortic valve disorder, unspecified: Secondary | ICD-10-CM

## 2011-09-14 MED ORDER — SODIUM CHLORIDE 0.9 % IJ SOLN
INTRAMUSCULAR | Status: AC
  Administered 2011-09-14: 5 mL
  Filled 2011-09-14: qty 3

## 2011-09-14 MED ORDER — PANTOPRAZOLE SODIUM 40 MG PO TBEC
40.0000 mg | DELAYED_RELEASE_TABLET | Freq: Every day | ORAL | Status: DC
  Administered 2011-09-15 – 2011-09-16 (×2): 40 mg via ORAL
  Filled 2011-09-14 (×2): qty 1

## 2011-09-14 NOTE — Progress Notes (Signed)
Called into patient's room by patient. Patient was upset and crying and stated chicken (patient was eating a grilled chicken sandwiche for lunch) was stuck in her throat. Patient began to cough and after several tries she eventually coughed up a moderate amount of what appeared to be thick clear mucus with pureed looking white matter. Patient stated her throat still hurt but she didn't feel like anything else was stuck in her throat. Called Dr. Juanetta Gosling and notified him of the situation, Dr. Juanetta Gosling ordered mechanical soft diet and stated patient is getting a GI consult today. Diet ordered as requested by Dr. Juanetta Gosling. Patient no longer in distress or pain and is resting in her chair with the call light in reach. Will continue to monitor.

## 2011-09-14 NOTE — Consult Note (Signed)
CARDIOLOGY CONSULT NOTE  Patient ID: [name] MRN: [id number] DOB/AGE: 05/05/1915 78 y.o.  Admit date: 09/13/2011 Referring Physician: Sudie Bailey Primary Herb Grays, MD Primary Cardiologist: Dr. Dietrich Pates Reason for Consultation: Assistance with cardiac management in the setting of transient blindness, with history of Afib, CAD, hypertension and pacemaker.  Principal Problem:  *Amaurosis fugax, both eyes Active Problems:  HYPERLIPIDEMIA  HYPERTENSION  Arteriosclerotic cardiovascular disease (ASCVD)  Chronic anticoagulation  Sick sinus syndrome  Aortic stenosis  Left bundle branch block  Congestive heart failure  Chronic kidney disease  Gastroesophageal reflux disease  Chronic lymphocytic leukemia  HPI: [name] is a 78 year old very pleasant Caucasian female patient of Dr. Andrew Bing that we follow for PAF, chronic Coumadin therapy followed in our clinic, sick sinus syndrome status post pacemaker implantation in 2008, aortic stenosis, and hypertension. She has multiple medical issues that are outlined in detail on Dr. Michelle Nasuti H&P. This does include chronic leukocytic leukemia, GI issues to include edematous polyp in her duodenum and esophageal strictures. She was in her usual state of health when she was unable to sleep the night prior to admission. She got up and was working on her computer about 5:30 AM. She works on her computer for approximately one hour and when she stood up she became very dizzy and lightheaded and lost vision in both of her eyes. She rested in a recliner, and vision slowly returned. She call EMS who advised her to be seen in the emergency room. She states this is not the first time this has happened and had been having these episodes once or twice a year. However, over the last several weeks she has had them more often. It is usually associated with dizziness and position change but not always. She is also been complaining of trouble  swallowing with food getting stuck on the way down.      In the emergency room she was assessed and found to be slightly hypertensive with a blood pressure 115/51 heart rate 76 with a febrile state. She was found to be positive for UTI. Creatinine was elevated at 1.12, BUN 31. Cardiac enzymes are found be negative, she was mildly anemic with a hemoglobin 11.1 hematocrit 34.4 with white blood cells 25.6. PT with INR revealed a PT of 29.5 with an INR 2.75. CT of the head revealed no acute intracranial abnormalities, she did have cerebral and cerebella atrophy with chronic microvascular ischemic changes in this report white matter results removal infarct. Her pacemaker was interrogated in the emergency room and found to be functioning appropriately.   She has been admitted for further evaluation and observation in the setting of possible TIA.. This a.m. she is feeling better with no further discomfort in her chest and no visual changes. She is tearful and a little frightened about this recent episode. She is planned for MRI and MRA today. GI consult is also planned secondary to difficulty swallowing, along with a neuro consult for transient blindness. We are asked for cardiac input in this setting of atrial fibrillation and aortic valve stenosis. Review of systems complete and found to be negative unless listed above   Past Medical History  Diagnosis Date  . Sick sinus syndrome 11/2006    paroxysmal atrial fibrillation; dual-chamber pacemaker in 11/2006  . Aortic stenosis     mild  . Left bundle branch block   . Congestive heart failure     Congestive heart failure with normal ejection fraction  . Arteriosclerotic cardiovascular disease (ASCVD)  coronary angio in 10/09:50% left anterior descending; 80% PDA; medical therapy advisedOrthostatic decrease in blood pressure  . Epistaxis     mild with negative ENT evaluation  . Chronic kidney disease      Creatinine of 1.7 in 8/08, 1.4 in 10/08 and 1.5 in  11/09  . Dizziness     chronic  . Osteoarthritis   . Irritable bowel syndrome   . Urinary frequency     with incontinence  . Gastroesophageal reflux disease   . Rosacea   . Asthma     Childhood asthma  . Chronic lymphocytic leukemia     Stage I with anemia H./H. of 9/27 in 11/09 with normal MCV; iron deficiency in bone marrow  . Orthostatic hypotension   . Chronic anticoagulation   . Diverticulosis   . Adenomatous polyp October 2010    (Proximal Small bowel) With high grade dysplasia in polypoid lesion straddling D1/D2    Family History  Problem Relation Age of Onset  . Heart failure Father   . Cancer Father   . Diabetes Mother     History   Social History  . Marital Status: Widowed    Spouse Name: N/A    Number of Children: N/A  . Years of Education: N/A   Occupational History  . retired    Social History Main Topics  . Smoking status: Never Smoker   . Smokeless tobacco: Never Used  . Alcohol Use: No  . Drug Use: No  . Sexually Active: No   Other Topics Concern  . Not on file   Social History Narrative  . No narrative on file    Past Surgical History  Procedure Date  . Cholecystectomy   . Cervical spine surgery   . Lumbar disc surgery   . Bladder suspension   . Abdominal hysterectomy 1972  . A-v cardiac pacemaker insertion 11/2006    [employer]  . Cataract extraction     Bilateral  . Esophagogastroduodenoscopy 01/09/09    Rourk -noncritical Schatzki's ring, small hiatal hernia, fundal gland polyps, bile-stained gastric mucosa, polypoid lesion straddling D1 and D2 1-2 cm, localized lymphangitic-appearing mucosa at D2 biopsied (adenomatous polyp with high grade glandular dysplasia     Prescriptions prior to admission  Medication Sig Dispense Refill  . acetaminophen (TYLENOL) 500 MG tablet Take 1,000 mg by mouth 4 (four) times daily.        . calcium-vitamin D (OSCAL WITH D) 500-200 MG-UNIT per tablet Take 1 tablet by mouth daily.        . furosemide  (LASIX) 20 MG tablet Take 20 mg by mouth daily.      [name] lisinopril (PRINIVIL,ZESTRIL) 10 MG tablet Take 1 tablet (10 mg total) by mouth daily.  90 tablet  3  . metoprolol tartrate (LOPRESSOR) 25 MG tablet Take 1 tablet (25 mg total) by mouth 2 (two) times daily.  180 tablet  3  . Multiple Vitamins-Minerals (MULTIVITAMIN WITH MINERALS) tablet Take 1 tablet by mouth daily.        . pravastatin (PRAVACHOL) 40 MG tablet Take 40 mg by mouth daily.        . verapamil (CALAN-SR) 180 MG CR tablet Take 1 tablet (180 mg total) by mouth at bedtime.  90 tablet  3  . warfarin (COUMADIN) 2.5 MG tablet Take 2.5 mg by mouth daily.        Physical Exam: Blood pressure 152/69, pulse 71, temperature 97.4 F (36.3 C), temperature source Oral, resp. rate 18, height  5' (1.524 m), weight 159 lb 1.6 oz (72.167 kg), SpO2 94.00%.  General: Well developed, well nourished, in no acute distress Head: Eyes PERRLA, No xanthomas.   Normal cephalic and atramatic  Lungs: Clear bilaterally to auscultation and percussion. Heart: HRIR S1 S2, 2/6 systolic murmur, with radiation to the carotids bilaterally. Pulses are 2+ & equal. No JVD.  No abdominal bruits. No femoral bruits. Abdomen: Bowel sounds are positive, abdomen soft and non-tender without masses or                  Hernia's noted. Msk:  Back normal, normal gait. Normal strength and tone for age. Extremities: No clubbing, cyanosis or edema. Multiple varicosities in the feet and legs. DP +1 Neuro: Alert and oriented X 3. Psych:  Good affect, responds appropriately, tearful and mildly anxious    Lab Results  Component Value Date   WBC 25.6* 09/13/2011   HGB 11.1* 09/13/2011   HCT 34.4* 09/13/2011   MCV 98.6 09/13/2011   PLT 227 09/13/2011    Lab 09/13/11 0817  NA 140  K 3.8  CL 104  CO2 26  BUN 31*  CREATININE 1.12*  CALCIUM 9.6  PROT 6.8  BILITOT 0.3  ALKPHOS 99  ALT 13  AST 21  GLUCOSE 148*   Lab Results  Component Value Date   CKTOTAL 37 12/14/2007    CKMB 1.6 12/14/2007   TROPONINI <0.30 09/13/2011    Lab Results  Component Value Date   CHOL 145 05/26/2011   CHOL 108 08/26/2009   CHOL 121 05/29/2008   Lab Results  Component Value Date   HDL 37* 05/26/2011   HDL 26* 08/26/2009   HDL 26 05/29/2008   Lab Results  Component Value Date   LDLCALC 71 05/26/2011   LDLCALC 39 08/26/2009   LDLCALC 43 05/29/2008   Lab Results  Component Value Date   TRIG 186* 05/26/2011   TRIG 214* 08/26/2009   TRIG 259 05/29/2008   Lab Results  Component Value Date   CHOLHDL 3.9 05/26/2011   CHOLHDL 4.2 Ratio 08/26/2009   No results found for this basename: LDLDIRECT     Echocardiogram: 12/14/2007 Overall left ventricular systolic function was normal. Left ventricular ejection fraction was estimated , range being 55 % to 60 %. There was no diagnostic evidence of left ventricular regional wall motion abnormalities. Left ventricular wall thickness was mildly to moderately increased. Features were consistent with a pseudonormal left ventricular filling pattern, with concomitant abnormal relaxation and increased filling pressure. There was paradoxical motion of the interventricular septum, consistent with a conduction abnormality or paced rhythm. - The aortic valve was mildly calcified. There was mildly reduced aortic valve leaflet excursion. Findings were consistent with mild aortic valve stenosis. There was mild aortic valvular regurgitation. The aortic regurgitation jet was central. The mean transaortic valve gradient was 18 mmHg. Estimated aortic valve area (by VTI) was 1.29 cm^2. Estimated aortic valve area (by Vmax) was 1.28 cm^2. - There was mild fibrocalcific change of the aortic root. - There was mild to moderate mitral annular calcification. There was moderate mitral valvular regurgitation. Mean transmitral gradient was 5 mmHg. Mitral valve area by pressure half-time was 2.22 cm^2. Mitral valve area by continuity equation was 1.48 cm^2. The  effective orifice of mitral regurgitation by proximal isovelocity surface area was 0.11 cm^2. The volume of mitral regurgitation by proximal isovelocity surface area was 20 cc. - The left atrium was moderately dilated. - There was the appearance of a  catheter or pacing wire in the right ventricle. - The estimated peak pulmonary artery systolic pressure was moderately increased. - There was mild to moderate tricuspid valvular regurgitation. - The right atrium was moderately dilated.    Radiology: Ct Head Wo Contrast  09/13/2011  *RADIOLOGY REPORT*  Clinical Data: Near syncope.  Chest pain.  Visual changes.  CT HEAD WITHOUT CONTRAST  Technique: IMPRESSION: 1.  No acute intracranial abnormalities. 2.  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  Original Report Authenticated By: Florencia Reasons, M.D.   Dg Chest Port 1 View  09/13/2011  *RADIOLOGY REPORT*  Clinical Data: Shortness of breath, chest pain, syncope  PORTABLE CHEST - 1 VIEW  Comparison: 08/27/2011  Findings: Left subclavian pacemaker stable in position.  Mild cardiomegaly.  Mild bibasilar interstitial prominence without overt interstitial edema.  No focal airspace consolidation.  No effusion.  IMPRESSION: 1.  No acute disease. 2.  Chronic cardiomegaly and bibasilar interstitial changes.  Original Report Authenticated By: Thora Lance III, M.D.   ZOX:[id number] pacemaker; rate of 70 Undetermined rhythm Left bundle branch block  ASSESSMENT AND PLAN:   1. Pre-Syncope with Transient Blindness (Amaurosis fugax, both eyes): Uncertain etiology at this time. She has a history of atrial fibrillation and is anticoagulated on Coumadin with INR therapeutic. TIA is a possibility. No evidence of bleed or acute infarction noted on CT scan. We will check orthostatics and she had been sitting, and when standing up became very dizzy. She is  not significantly anemic. Pacemaker was interrogated and was found to be functioning appropriately with no evidence of arrhythmia.  2. Aortic valve stenosis. Most recent echocardiogram was completed in 2009. If she became orthostatic she may have dropped her preload, and cardiac output. This may have caused syncopal episode in the setting of aortic valve stenosis. We will repeat echocardiogram for reevaluation of aortic valve.  3. Atrial fibrillation: Currently rate controlled and is predominantly demand pacing. She remains on metoprolol 25 mg twice a day, verapamil 180 mg at bedtime. She is also on Lasix 20 mg daily. May need to discontinue if she is orthostatic.  4. Hypertension: Blood pressure is currently well-controlled on current medication regimen. She remains on medications as listed above along with an ACE inhibitor. We will follow as stated with orthostatic blood pressures and adjust medications if necessary.   Signed: Bettey Mare. Lyman Bishop NP [facility] 09/14/2011, 9:24 AM Co-Sign MD   Attending note:  Patient seen and examined. Reviewed records and database as recorded by Ms. Lawrence. She is a patient of Dr. Dietrich Pates, followed with atrial fibrillation on Coumadin, also aortic stenosis graded as mild in 2009. She presents with episodes of visual change, transient loss in vision in both eyes, associated with lightheadedness. She states she feels like she has a black spot over both eyes, this has occurred at least 4 or 5 times in the last month. She has had no chest pain or palpitations, otherwise no focal motor weakness or definite speech deficits.  Head CT demonstrated no acute intracranial abnormalities. Chronic microvascular ischemic changes were noted. She is also therapeutic on Coumadin with INR 2.7. Consultations with neurology and GI are pending. She is generally undergoing a workup for possible TIA, also additional complaints of dysphasia. We are consulted to assist with  her management. ECG shows ventricular paced rhythm, possible Q waves seen occasionally with very prolonged PR interval. Cardiac markers are  normal at this point.  On examination she is comfortable, describes improvement in her vision. Not dizzy when standing, orthostatics were negative at the bedside. Possible soft left carotid bruit. Lungs are clear and nonlabored, cardiac exam with 2/6 systolic murmur at the base, somewhat irregular rate and rhythm, no pitting edema distally.  Additional lab work reviewed finding potassium 3.8, creatinine 1.1, normal LFTs, CBC 25 (CLL).  At this point recommend continued medical therapy for atrial fibrillation including anticoagulation. Pacemaker was recently interrogated, no acute events by report. We will followup on the echocardiogram that has been ordered. Would also suggest carotid Dopplers to screen for obstructive carotid disease that might also be contributing. Neurology and gastroenterology consultations are pending.  Jonelle Sidle, M.D., F.A.C.C.

## 2011-09-14 NOTE — Consult Note (Signed)
Patient evaluated for dysphagia to solids as well as liquids. She also complains of frequent heartburn, at least 3-4 times a week. Patient is anticoagulated. We'll start patient on pantoprazole. Barium filled esophagogram in a.m.[name] Please see dictated note for details.

## 2011-09-14 NOTE — Progress Notes (Signed)
Subjective: She says she feels better. She has no chest pain and no visual disturbances.  Objective: Vital signs in last 24 hours: Temp:  [97.4 F (36.3 C)-97.5 F (36.4 C)] 97.4 F (36.3 C) (07/01 0502) Pulse Rate:  [68-95] 68  (07/01 0502) Resp:  [18-22] 18  (07/01 0502) BP: (121-158)/(43-77) 121/75 mmHg (07/01 0502) SpO2:  [93 %-98 %] 93 % (07/01 0502) Weight:  [72.167 kg (159 lb 1.6 oz)-73.9 kg (162 lb 14.7 oz)] 72.167 kg (159 lb 1.6 oz) (07/01 0453) Weight change:  Last BM Date: 09/12/11  Intake/Output from previous day: 06/30 0701 - 07/01 0700 In: 240 [P.O.:240] Out: -   PHYSICAL EXAM General appearance: alert, cooperative and no distress Resp: clear to auscultation bilaterally Cardio: irregularly irregular rhythm GI: soft, non-tender; bowel sounds normal; no masses,  no organomegaly Extremities: extremities normal, atraumatic, no cyanosis or edema  Lab Results:    Basic Metabolic Panel:  Basename 09/13/11 0817  NA 140  K 3.8  CL 104  CO2 26  GLUCOSE 148*  BUN 31*  CREATININE 1.12*  CALCIUM 9.6  MG --  PHOS --   Liver Function Tests:  Basename 09/13/11 0817  AST 21  ALT 13  ALKPHOS 99  BILITOT 0.3  PROT 6.8  ALBUMIN 3.7   No results found for this basename: LIPASE:2,AMYLASE:2 in the last 72 hours No results found for this basename: AMMONIA:2 in the last 72 hours CBC:  Basename 09/13/11 0817  WBC 25.6*  NEUTROABS 10.5*  HGB 11.1*  HCT 34.4*  MCV 98.6  PLT 227   Cardiac Enzymes:  Basename 09/13/11 0817  CKTOTAL --  CKMB --  CKMBINDEX --  TROPONINI <0.30   BNP:  Basename 09/13/11 0817  PROBNP 580.6*   D-Dimer: No results found for this basename: DDIMER:2 in the last 72 hours CBG: No results found for this basename: GLUCAP:6 in the last 72 hours Hemoglobin A1C: No results found for this basename: HGBA1C in the last 72 hours Fasting Lipid Panel: No results found for this basename: CHOL,HDL,LDLCALC,TRIG,CHOLHDL,LDLDIRECT in the  last 72 hours Thyroid Function Tests: No results found for this basename: TSH,T4TOTAL,FREET4,T3FREE,THYROIDAB in the last 72 hours Anemia Panel: No results found for this basename: VITAMINB12,FOLATE,FERRITIN,TIBC,IRON,RETICCTPCT in the last 72 hours Coagulation:  Basename 09/13/11 0845  LABPROT 29.5*  INR 2.75*   Urine Drug Screen: Drugs of Abuse  No results found for this basename: labopia, cocainscrnur, labbenz, amphetmu, thcu, labbarb    Alcohol Level: No results found for this basename: ETH:2 in the last 72 hours Urinalysis:  Basename 09/13/11 0940  COLORURINE YELLOW  LABSPEC 1.020  PHURINE 5.5  GLUCOSEU NEGATIVE  HGBUR NEGATIVE  BILIRUBINUR NEGATIVE  KETONESUR NEGATIVE  PROTEINUR NEGATIVE  UROBILINOGEN 0.2  NITRITE POSITIVE*  LEUKOCYTESUR TRACE*   Misc. Labs:  ABGS No results found for this basename: PHART,PCO2,PO2ART,TCO2,HCO3 in the last 72 hours CULTURES Recent Results (from the past 240 hour(s))  WOUND CULTURE     Status: Normal (Preliminary result)   Collection Time   09/13/11  4:39 PM      Component Value Range Status Comment   Specimen Description NOSE RIGHT   Final    Special Requests NONE   Final    Gram Stain PENDING   Incomplete    Culture NO GROWTH 1 DAY   Final    Report Status PENDING   Incomplete    Studies/Results: Ct Head Wo Contrast  09/13/2011  *RADIOLOGY REPORT*  Clinical Data: Near syncope.  Chest pain.  Visual  changes.  CT HEAD WITHOUT CONTRAST  Technique:  Contiguous axial images were obtained from the base of the skull through the vertex without contrast.  Comparison: Head CT 04/03/2003.  Brain MRI 11/16/2006.  Findings: A well-defined focus of low attenuation in the right cerebellar hemisphere is unchanged, compatible with an old cerebellar infarct.  There is mild cerebral and cerebellar atrophy which is age appropriate.  There are patchy and confluent areas of decreased attenuation throughout the deep and periventricular white matter of  the cerebral hemispheres bilaterally, compatible with chronic microvascular ischemic changes.  No definite acute intracranial abnormality.  Specifically, no definite signs of acute/subacute cerebral ischemia, and no intraparenchymal hemorrhage, no focal mass, mass effect, hydrocephalus or abnormal intra or extra-axial fluid collections.  No acute displaced skull fractures are identified.  The visualized paranasal sinuses and mastoids are well pneumatized, with the exception of a small polypoid density in the left maxillary sinus which may represent a mucosal retention cyst or polyp.  IMPRESSION: 1.  No acute intracranial abnormalities. 2.  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  Original Report Authenticated By: Florencia Reasons, M.D.   Dg Chest Port 1 View  09/13/2011  *RADIOLOGY REPORT*  Clinical Data: Shortness of breath, chest pain, syncope  PORTABLE CHEST - 1 VIEW  Comparison: 08/27/2011  Findings: Left subclavian pacemaker stable in position.  Mild cardiomegaly.  Mild bibasilar interstitial prominence without overt interstitial edema.  No focal airspace consolidation.  No effusion.  IMPRESSION: 1.  No acute disease. 2.  Chronic cardiomegaly and bibasilar interstitial changes.  Original Report Authenticated By: Osa Craver, M.D.    Medications:  Prior to Admission:  Prescriptions prior to admission  Medication Sig Dispense Refill  . acetaminophen (TYLENOL) 500 MG tablet Take 1,000 mg by mouth 4 (four) times daily.        . calcium-vitamin D (OSCAL WITH D) 500-200 MG-UNIT per tablet Take 1 tablet by mouth daily.        . furosemide (LASIX) 20 MG tablet Take 20 mg by mouth daily.      [name] lisinopril (PRINIVIL,ZESTRIL) 10 MG tablet Take 1 tablet (10 mg total) by mouth daily.  90 tablet  3  . metoprolol tartrate (LOPRESSOR) 25 MG tablet Take 1 tablet (25 mg total) by mouth 2 (two)  times daily.  180 tablet  3  . Multiple Vitamins-Minerals (MULTIVITAMIN WITH MINERALS) tablet Take 1 tablet by mouth daily.        . pravastatin (PRAVACHOL) 40 MG tablet Take 40 mg by mouth daily.        . verapamil (CALAN-SR) 180 MG CR tablet Take 1 tablet (180 mg total) by mouth at bedtime.  90 tablet  3  . warfarin (COUMADIN) 2.5 MG tablet Take 2.5 mg by mouth daily.       Scheduled:   . acetaminophen      . acetaminophen  1,000 mg Oral Q6H  . cephALEXin  500 mg Oral Q8H  . furosemide  20 mg Oral Daily  . lisinopril  10 mg Oral Daily  . metoprolol tartrate  25 mg Oral BID  . verapamil  180 mg Oral QHS  . warfarin  2.5 mg Oral q1800  . Warfarin - Physician Dosing Inpatient   Does not apply q1800   Continuous:  [id number]  Assesment: She has multiple medical problems and has recently had transient blindness of both eyes. This may  be a neurological event. She has CHF a history of chronic lymphocytic leukemia which is stable and his other cardiac problems. Principal Problem:  *Amaurosis fugax, both eyes Active Problems:  HYPERLIPIDEMIA  HYPERTENSION  Arteriosclerotic cardiovascular disease (ASCVD)  Chronic anticoagulation  Sick sinus syndrome  Aortic stenosis  Left bundle branch block  Congestive heart failure  Chronic kidney disease  Gastroesophageal reflux disease  Chronic lymphocytic leukemia    Plan: She will have GI consult because of difficulty swallowing and neurology consultation for her transient blindness    LOS: 1 day   Rosali Augello L 09/14/2011, 8:45 AM

## 2011-09-14 NOTE — Care Management Note (Signed)
    Page 1 of 1   09/16/2011     10:42:57 AM   CARE MANAGEMENT NOTE 09/16/2011  Patient:  [name],[name]   Account Number:  [id number]  Date Initiated:  09/14/2011  Documentation initiated by:  Sharrie Rothman  Subjective/Objective Assessment:   Pt admitted from home with syncopal episode with transient blindness. Pt lives alone and will return home at discharge. Pt has a son who is very active in her care. Pt has a cane and walker available for home use.     Action/Plan:   Pt may benefit from home health at discharge. Will continue to monitor.   Anticipated DC Date:  09/15/2011   Anticipated DC Plan:  HOME/SELF CARE      DC Planning Services  CM consult      Choice offered to / List presented to:             Status of service:  Completed, signed off Medicare Important Message given?   (If response is "NO", the following Medicare IM given date fields will be blank) Date Medicare IM given:   Date Additional Medicare IM given:    Discharge Disposition:  HOME/SELF CARE  Per UR Regulation:    If discussed at Long Length of Stay Meetings, dates discussed:    Comments:  09/16/11 1042 [name], RN BSN CM Pt discharged home today. No CM needs noted.  09/14/11 1050 [name], RN BSN CM

## 2011-09-15 ENCOUNTER — Inpatient Hospital Stay (HOSPITAL_COMMUNITY): Payer: Medicare Other

## 2011-09-15 DIAGNOSIS — I359 Nonrheumatic aortic valve disorder, unspecified: Secondary | ICD-10-CM

## 2011-09-15 LAB — PROTIME-INR
INR: 2.56 — ABNORMAL HIGH (ref 0.00–1.49)
Prothrombin Time: 27.9 seconds — ABNORMAL HIGH (ref 11.6–15.2)

## 2011-09-15 MED ORDER — MUPIROCIN 2 % EX OINT
TOPICAL_OINTMENT | Freq: Two times a day (BID) | CUTANEOUS | Status: DC
  Administered 2011-09-15 – 2011-09-16 (×2): via NASAL
  Filled 2011-09-15 (×2): qty 22

## 2011-09-15 NOTE — Progress Notes (Signed)
Subjective: She has not had anymore neurological episodes. She had telemetry neurology consultation it was felt it was unlikely that her problem was related to a neurological event. She has had cardiology and GI consultation as well. She says she feels better.  Objective: Vital signs in last 24 hours: Temp:  [97.4 F (36.3 C)-98.1 F (36.7 C)] 98.1 F (36.7 C) (07/02 0500) Pulse Rate:  [66-79] 66  (07/02 0502) Resp:  [18-20] 20  (07/02 0502) BP: (130-158)/(51-78) 136/75 mmHg (07/02 0502) SpO2:  [93 %-97 %] 96 % (07/02 0502) Weight change:  Last BM Date: 09/14/11  Intake/Output from previous day: 07/01 0701 - 07/02 0700 In: 235 [P.O.:230; I.V.:5] Out: -   PHYSICAL EXAM General appearance: alert, cooperative and no distress Resp: clear to auscultation bilaterally Cardio: systolic murmur: systolic ejection 3/6, crescendo throughout the precordium GI: soft, non-tender; bowel sounds normal; no masses,  no organomegaly Extremities: extremities normal, atraumatic, no cyanosis or edema  Lab Results:    Basic Metabolic Panel:  Basename 09/13/11 0817  NA 140  K 3.8  CL 104  CO2 26  GLUCOSE 148*  BUN 31*  CREATININE 1.12*  CALCIUM 9.6  MG --  PHOS --   Liver Function Tests:  Basename 09/13/11 0817  AST 21  ALT 13  ALKPHOS 99  BILITOT 0.3  PROT 6.8  ALBUMIN 3.7   No results found for this basename: LIPASE:2,AMYLASE:2 in the last 72 hours No results found for this basename: AMMONIA:2 in the last 72 hours CBC:  Basename 09/13/11 0817  WBC 25.6*  NEUTROABS 10.5*  HGB 11.1*  HCT 34.4*  MCV 98.6  PLT 227   Cardiac Enzymes:  Basename 09/13/11 0817  CKTOTAL --  CKMB --  CKMBINDEX --  TROPONINI <0.30   BNP:  Basename 09/13/11 0817  PROBNP 580.6*   D-Dimer: No results found for this basename: DDIMER:2 in the last 72 hours CBG: No results found for this basename: GLUCAP:6 in the last 72 hours Hemoglobin A1C: No results found for this basename: HGBA1C in  the last 72 hours Fasting Lipid Panel: No results found for this basename: CHOL,HDL,LDLCALC,TRIG,CHOLHDL,LDLDIRECT in the last 72 hours Thyroid Function Tests: No results found for this basename: TSH,T4TOTAL,FREET4,T3FREE,THYROIDAB in the last 72 hours Anemia Panel: No results found for this basename: VITAMINB12,FOLATE,FERRITIN,TIBC,IRON,RETICCTPCT in the last 72 hours Coagulation:  Basename 09/15/11 0513 09/13/11 0845  LABPROT 27.9* 29.5*  INR 2.56* 2.75*   Urine Drug Screen: Drugs of Abuse  No results found for this basename: labopia, cocainscrnur, labbenz, amphetmu, thcu, labbarb    Alcohol Level: No results found for this basename: ETH:2 in the last 72 hours Urinalysis:  Basename 09/13/11 0940  COLORURINE YELLOW  LABSPEC 1.020  PHURINE 5.5  GLUCOSEU NEGATIVE  HGBUR NEGATIVE  BILIRUBINUR NEGATIVE  KETONESUR NEGATIVE  PROTEINUR NEGATIVE  UROBILINOGEN 0.2  NITRITE POSITIVE*  LEUKOCYTESUR TRACE*   Misc. Labs:  ABGS No results found for this basename: PHART,PCO2,PO2ART,TCO2,HCO3 in the last 72 hours CULTURES Recent Results (from the past 240 hour(s))  URINE CULTURE     Status: Normal (Preliminary result)   Collection Time   09/13/11 10:32 AM      Component Value Range Status Comment   Specimen Description URINE, CLEAN CATCH   Final    Special Requests NONE   Final    Culture  Setup Time 09/13/2011 20:31   Final    Colony Count >=100,000 COLONIES/ML   Final    Culture GRAM NEGATIVE RODS   Final  Report Status PENDING   Incomplete   WOUND CULTURE     Status: Normal (Preliminary result)   Collection Time   09/13/11  4:39 PM      Component Value Range Status Comment   Specimen Description NOSE RIGHT   Final    Special Requests NONE   Final    Gram Stain     Final    Value: NO WBC SEEN     RARE SQUAMOUS EPITHELIAL CELLS PRESENT     RARE GRAM NEGATIVE RODS   Culture NO GROWTH 1 DAY   Final    Report Status PENDING   Incomplete    Studies/Results: Ct Head Wo  Contrast  09/13/2011  *RADIOLOGY REPORT*  Clinical Data: Near syncope.  Chest pain.  Visual changes.  CT HEAD WITHOUT CONTRAST  Technique:  Contiguous axial images were obtained from the base of the skull through the vertex without contrast.  Comparison: Head CT 04/03/2003.  Brain MRI 11/16/2006.  Findings: A well-defined focus of low attenuation in the right cerebellar hemisphere is unchanged, compatible with an old cerebellar infarct.  There is mild cerebral and cerebellar atrophy which is age appropriate.  There are patchy and confluent areas of decreased attenuation throughout the deep and periventricular white matter of the cerebral hemispheres bilaterally, compatible with chronic microvascular ischemic changes.  No definite acute intracranial abnormality.  Specifically, no definite signs of acute/subacute cerebral ischemia, and no intraparenchymal hemorrhage, no focal mass, mass effect, hydrocephalus or abnormal intra or extra-axial fluid collections.  No acute displaced skull fractures are identified.  The visualized paranasal sinuses and mastoids are well pneumatized, with the exception of a small polypoid density in the left maxillary sinus which may represent a mucosal retention cyst or polyp.  IMPRESSION: 1.  No acute intracranial abnormalities. 2.  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  Original Report Authenticated By: Florencia Reasons, M.D.   US Carotid Duplex Bilateral  09/14/2011  *RADIOLOGY REPORT*  Clinical Data: Visual disturbance, TIA symptoms  BILATERAL CAROTID DUPLEX ULTRASOUND  Technique: Gray scale imaging, color Doppler and duplex ultrasound was performed of bilateral carotid and vertebral arteries in the neck.  Comparison:  None.  Criteria:  Quantification of carotid stenosis is based on velocity parameters that correlate the residual internal carotid diameter with NASCET-based  stenosis levels, using the diameter of the distal internal carotid lumen as the denominator for stenosis measurement.  The following velocity measurements were obtained:                   PEAK SYSTOLIC/END DIASTOLIC RIGHT ICA:                        92/15cm/sec CCA:                        91/11cm/sec SYSTOLIC ICA/CCA RATIO:     1.01 DIASTOLIC ICA/CCA RATIO:    1.32 ECA:                        74cm/sec  LEFT ICA:                        111/24cm/sec CCA:                        109/7cm/sec SYSTOLIC ICA/CCA RATIO:  1.02 DIASTOLIC ICA/CCA RATIO:    3.35 ECA:                        87cm/sec  Findings:  RIGHT CAROTID ARTERY: Scattered minor atherosclerotic changes.  No hemodynamically significant right ICA stenosis, velocity elevation, or turbulent flow.  RIGHT VERTEBRAL ARTERY:  Antegrade  LEFT CAROTID ARTERY: Minor atherosclerotic changes and intimal thickening.  No hemodynamically significant left ICA stenosis, velocity elevation, or turbulent flow.  LEFT VERTEBRAL ARTERY:  Antegrade  IMPRESSION: Minor atherosclerotic changes.  No hemodynamically significant ICA stenosis on either side  Original Report Authenticated By: Judie Petit. Ruel Favors, M.D.   Dg Chest Port 1 View  09/13/2011  *RADIOLOGY REPORT*  Clinical Data: Shortness of breath, chest pain, syncope  PORTABLE CHEST - 1 VIEW  Comparison: 08/27/2011  Findings: Left subclavian pacemaker stable in position.  Mild cardiomegaly.  Mild bibasilar interstitial prominence without overt interstitial edema.  No focal airspace consolidation.  No effusion.  IMPRESSION: 1.  No acute disease. 2.  Chronic cardiomegaly and bibasilar interstitial changes.  Original Report Authenticated By: Osa Craver, M.D.    Medications:  Scheduled:   . acetaminophen  1,000 mg Oral Q6H  . cephALEXin  500 mg Oral Q8H  . furosemide  20 mg Oral Daily  . lisinopril  10 mg Oral Daily  . metoprolol tartrate  25 mg Oral BID  . pantoprazole  40 mg Oral Q1200  . sodium chloride        . verapamil  180 mg Oral QHS  . warfarin  2.5 mg Oral q1800  . Warfarin - Physician Dosing Inpatient   Does not apply q1800   Continuous:  [id number]  Assesment: She has had an episode of amaurosis fugax but it does not appear to have been neurologically mediated. She has aortic stenosis which may be part of the problem. She has what is probably a urinary tract infection with the organism is not known yet Principal Problem:  *Amaurosis fugax, both eyes Active Problems:  HYPERLIPIDEMIA  HYPERTENSION  Arteriosclerotic cardiovascular disease (ASCVD)  Chronic anticoagulation  Sick sinus syndrome  Aortic stenosis  Left bundle branch block  Congestive heart failure  Chronic kidney disease  Gastroesophageal reflux disease  Chronic lymphocytic leukemia  Atrial fibrillation    Plan: She has further GI evaluation scheduled. She will continue with her other treatments. I have asked for PT evaluation.    LOS: 2 days   Algernon Mundie L 09/15/2011, 8:31 AM

## 2011-09-15 NOTE — Progress Notes (Signed)
UR chart review completed.  

## 2011-09-15 NOTE — Consult Note (Signed)
[name], [name] NO.:  [id number]  MEDICAL RECORD NO.:  [id number]  LOCATION:                                 FACILITY:  PHYSICIAN:  [name], [name].D.    DATE OF BIRTH:  Oct 07, 1915  DATE OF CONSULTATION:  09/14/2011 DATE OF DISCHARGE:                                CONSULTATION   REASON FOR CONSULTATION:  Dysphagia to solids and liquids.  HISTORY OF PRESENT ILLNESS:  [name] is a 78 year old Caucasian female who is admitted to Dr. Juanetta [name]' Service on September 13, 2011, with lightheadedness and transient bilateral loss of vision from which she has recovered.  Head CT showed microvascular changes, but no acute abnormalities.  The patient felt to have TIA.  She tells me she has had 2 episodes in the past as well.  She has history of paroxysmal atrial fibrillation and is chronically anticoagulated with therapeutic INR. She was evaluated by Dr. Nona [name] for Dr. Dietrich [name] and no further cardiac workup was recommended.  I last saw the patient in the office in February 2012, for IBS.  The patient has been complaining of dysphagia primarily to solids, but at times with liquids over the last few weeks.  She recalls she had 2 bad episodes while she was eating chicken.  She points to midsternal area site of bolus obstruction.  On both occasions, food bolus passed spontaneously.  She also complains of at least 3-4 episodes of heartburn every day.  She denies melena or rectal bleeding.  She has a good appetite.  She remains with chronic diarrhea and urgency and is using depends as she has had few accidents.  She states the frequency of her bowel movements has decreased.  She also complains of intermittent right mid abdominal pain.  CURRENT MEDICATIONS: 1. Cephalexin 500 mg p.o. q.8. 2. Furosemide 20 mg p.o. daily. 3. Lisinopril 10 mg p.o. daily. 4. Metoprolol 25 mg p.o. b.i.d. 5. Verapamil 180 mg p.o. at bedtime. 6. Warfarin 2.5 mg p.o. daily. 7. Imodium 2  mg p.o. p.r.n. dose after each meal.  PAST MEDICAL HISTORY:  She has chronic diarrhea felt to be secondary to IBS.  She had colonoscopy by me in October 2007, revealing pancolonic diverticulosis and she has single small polyp removed from ascending colon which was non adenomatous.  In August 2009, she had flexible sigmoidoscopy by Dr. Jena [name] and biopsy was negative for microscopic or collagenous colitis.  History of duodenal tubulovillous adenoma.  This was initially discovered by Dr. Jena [name] in June 2009, and she had saline assisted polypectomy.  She had a repeat EGD in August 2009 and piecemeal polypectomy performed.  Dr. Jena [name] had recommended repeat examination a year, but she decided not to have it.  She has hypertension.  History of paroxysmal atrial fibrillation that is chronically anticoagulated.  She had pacemaker placed in September 2008.  History of GERD.  Hyperlipidemia.  Renal insufficiency.  She has CLL and mild anemia diagnosed about 3 years ago.  In April 2003, she had ERCP with sphincterotomy when she presented with biliary pancreatitis.  She had hysterectomy in 1972, and she has also had surgery on her neck for disk  disease.  She also has history of CHF and arteriosclerotic coronary artery disease.  Urinary incontinence.  History of aortic stenosis.  ALLERGIES-SULFA:  [name] and [name].  FAMILY HISTORY:  Mother was diagnosed with colon carcinoma at age 70 and died a year later.  Apparently, did not have surgery.  Father died of lung carcinoma at age 34.  She lost 1 sister of leukemia at age 57.  SOCIAL HISTORY:  She is widow.  She lives alone.  She is still driving and fairly independent.  She has 1 son who lives close by.  She lost another son at age 77 of Hodgkin lymphoma in 75.  She has never smoked cigarettes or drank alcohol.  PHYSICAL EXAMINATION:  VITAL SIGNS:  She weighs 159 pounds.  She is 59 inches tall.  Pulse 73 per minute and  appears to be regular, blood pressure 156/65 and respiratory rate 18, temp is 97.4. EYES:  Conjunctivae are pink.  Sclerae are nonicteric. MOUTH:  Oropharyngeal mucosa is normal.  Dentition in satisfactory condition.  She had single tooth bridge in the left lower jaw. NECK:  No neck masses or thyromegaly noted. CARDIAC:  With regular rhythm.  Normal S1 and S2.  She has grade 2/6 systolic ejection murmur best heard at aortic area. LUNGS:  Clear to auscultation. ABDOMEN:  Full.  Bowel sounds are normal.  No bruits noted.  On palpation, soft abdomen with mild generalized tenderness.  No organomegaly or masses. RECTAL:  Deferred. EXTREMITIES:  No peripheral edema or clubbing noted.  LABORATORY DATA:  From admission; WBC 25.6, H and H is 11.1 and 34.4, platelet count is 2-7 K, 41% neutrophils and 53% lymphocytes. Electrolytes are normal, glucose 148, BUN 31, creatinine 1.12, calcium 9.6.  Total bilirubin 0.3, AP 99, AST 21, ALT 13, total protein 6.8 with albumin of 3.7.  ASSESSMENT:  Ms. [name] is a 78 year old Caucasian female who was admitted yesterday with a transient blindness and she is fully recovered.  Acute symptoms are felt to be due to TIA.  She is chronically anticoagulated and INR is in therapeutic range.  She is now complaining of dysphagia primarily to solids, but also to liquids and to frequent heartburn.  She has undergone EGD in the past and stricture ring was not documented, but last exam was in August 2009. Her upper GI tract needs to be re-evaluated.  Since she is anticoagulated and in therapeutic range, I would first assess her symptoms with barium pill study prior to considering EGD.  If indeed she needs to have her esophagus dilated, she will have to be off warfarin for 5 days.  If EGD is needed filled get clearance from [name] or Dr. Dietrich [name].  RECOMMENDATIONS:  Pantoprazole 40 mg p.o. q.a.[name].  Barium pill esophagogram in a.[name].  We appreciate the  opportunity to participate in the care of this nice lady.          ______________________________ [name], [name].D.     NR/MEDQ  D:  09/14/2011  T:  09/14/2011  Job:  [id number]

## 2011-09-15 NOTE — Progress Notes (Signed)
*  PRELIMINARY RESULTS* Echocardiogram 2D Echocardiogram has been performed.  [name] 09/15/2011, 9:25 AM

## 2011-09-15 NOTE — Progress Notes (Signed)
Barium study reviewed with Dr. Tyron Russell and results discussed with patient. She has esophageal diverticulum and distal esophageal stricture. 13 mm barium pill did not pass this area. Patient will need esophageal dilation. Patient will need to be off warfarin. Will discuss with Dr. Juanetta Gosling and Dr. Dietrich Pates and plan this procedure on an outpatient basis. Patient also advised to take Imodium OTC 2 mg every morning after she has had a BM.

## 2011-09-15 NOTE — Evaluation (Signed)
Physical Therapy Evaluation Patient Details Name: [name] MRN: [id number] DOB: 02-19-1916 Today's Date: 09/15/2011 Time: 4098-1191 PT Time Calculation (min): 30 min  PT Assessment / Plan / Recommendation Clinical Impression  Ms. Biswell has slight decreased balance and strength and  would benefit from instruction in ambulating with a cane to improve safety at home.   Pt has notice decline in the past month.  OP therapy for balance program may be beneficial.    PT Assessment  Patient needs continued PT services    Follow Up Recommendations  Outpatient PT (To address gradual decline of mobility in the past month)    Barriers to Discharge  none      Equipment Recommendations  Cane    Recommendations for Other Services   OP  Frequency Min 3X/week    Precautions / Restrictions Precautions Precautions: Fall Restrictions Weight Bearing Restrictions: No       Mobility  Transfers Transfers: Sit to Stand;Stand to Sit Sit to Stand: 6: Modified independent (Device/Increase time) Stand to Sit: 6: Modified independent (Device/Increase time) Ambulation/Gait Ambulation/Gait Assistance: 4: Min guard (pt tends to furniture walk.  Will benefit from cane.) Ambulation Distance (Feet): 150 Feet Assistive device: None Ambulation/Gait Assistance Details: LOB x 5 able to self correct.  Will assess cane and recommend least assistive device that will keep pt safe. Stairs: No Corporate treasurer: No    Exercises General Exercises - Lower Extremity Long Arc Quad: Strengthening;Both;10 reps Hip Flexion/Marching: Standing;10 reps;Both Heel Raises: Strengthening;10 reps;Both Mini-Sqauts: Strengthening;Both;10 reps   PT Diagnosis: Difficulty walking  PT Problem List: Decreased strength;Decreased activity tolerance;Decreased balance PT Treatment Interventions: Gait training;Balance training;Neuromuscular re-education   PT Goals Acute Rehab PT Goals PT Goal Formulation:  With patient Time For Goal Achievement: 09/16/11 Potential to Achieve Goals: Good Pt will Ambulate: >150 feet;with modified independence;with cane PT Goal: Ambulate - Progress: Goal set today Pt will Go Up / Down Stairs: 3-5 stairs;with modified independence;with cane PT Goal: Up/Down Stairs - Progress: Goal set today  Visit Information  Last PT Received On: 09/15/11    Subjective Data  Subjective: Pt states she normally does not use an assistive device.  Pt complains of being hot. Patient Stated Goal: Pt  would like to go home   Prior Functioning  Home Living Lives With: Alone Available Help at Discharge: Family Type of Home: House Home Access: Level entry Home Layout: One level Bathroom Shower/Tub: Engineer, manufacturing systems: Standard Bathroom Accessibility: Yes Home Adaptive Equipment: Straight cane;Walker - rolling Additional Comments: Pt has not used assistive device; assisted devices belonged to late husband. Prior Function Level of Independence: Independent Able to Take Stairs?: Yes Driving: No Vocation: Retired Comments: Pt states that she was always very active and would get out of the house at least once a day but quit doing this in the past month and has become more sedentary. Communication Communication: No difficulties    Cognition  Overall Cognitive Status: Appears within functional limits for tasks assessed/performed Arousal/Alertness: Awake/alert Orientation Level: Appears intact for tasks assessed Behavior During Session: [facility] for tasks performed    Extremity/Trunk Assessment Right Lower Extremity Assessment RLE ROM/Strength/Tone: [facility] for tasks assessed Left Lower Extremity Assessment LLE ROM/Strength/Tone: [facility] for tasks assessed   Balance    End of Session PT - End of Session Equipment Utilized During Treatment: Gait belt Activity Tolerance: Patient tolerated treatment well Patient left: in chair;with call bell/phone within reach;with chair  alarm set Nurse Communication: Mobility status  GP  RUSSELL,CINDY 09/15/2011, 4:33 PM

## 2011-09-15 NOTE — Progress Notes (Addendum)
Primary cardiologist: Dr. Dietrich Pates  SUBJECTIVE:: Feels better. No further episodes of  Blindness.  LABS: Basic Metabolic Panel:  Basename 09/13/11 0817  NA 140  K 3.8  CL 104  CO2 26  GLUCOSE 148*  BUN 31*  CREATININE 1.12*  CALCIUM 9.6  MG --  PHOS --   Liver Function Tests:  [facility] 09/13/11 0817  AST 21  ALT 13  ALKPHOS 99  BILITOT 0.3  PROT 6.8  ALBUMIN 3.7   CBC:  Basename 09/13/11 0817  WBC 25.6*  NEUTROABS 10.5*  HGB 11.1*  HCT 34.4*  MCV 98.6  PLT 227   Cardiac Enzymes:  Basename 09/13/11 0817  CKTOTAL --  CKMB --  CKMBINDEX --  TROPONINI <0.30     RADIOLOGY: Ct Head Wo Contrast  09/13/2011  *RADIOLOGY REPORT*  Clinical Data: Near syncope.  Chest pain.  Visual changes.  CT HEAD WITHOUT CONTRAST  .  IMPRESSION: 1.  No acute intracranial abnormalities. 2.  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  Original Report Authenticated By: Florencia Reasons, M.D.   US Carotid Duplex Bilateral  09/14/2011  *RADIOLOGY REPORT*  Clinical Data: Visual disturbance, TIA symptoms  BILATERAL CAROTID DUPLEX ULTRASOUND   IMPRESSION: Minor atherosclerotic changes.  No hemodynamically significant ICA stenosis on either side  Original Report Authenticated By: Judie Petit. Ruel Favors, M.D.   Dg Chest Port 1 View  09/13/2011  *RADIOLOGY REPORT*  Clinical Data: Shortness of breath, chest pain, syncope  PORTABLE CHEST - 1 VIEW  Comparison: 08/27/2011  Findings: Left subclavian pacemaker stable in position.  Mild cardiomegaly.  Mild bibasilar interstitial prominence without overt interstitial edema.  No focal airspace consolidation.  No effusion.  IMPRESSION: 1.  No acute disease. 2.  Chronic cardiomegaly and bibasilar interstitial changes.  Original Report Authenticated By: Osa Craver, M.D.     PHYSICAL EXAM BP 136/75  Pulse 66  Temp 98.1 F (36.7 C) (Oral)   Resp 20  Ht 5' (1.524 m)  Wt 159 lb 1.6 oz (72.167 kg)  BMI 31.07 kg/m2  SpO2 96% General: Well developed, well nourished, in no acute distress Head: Eyes PERRLA, No xanthomas.   Normal cephalic and atramatic  Lungs: Clear bilaterally to auscultation and percussion. Heart: HRRR S1 S2, 2/6 systolic murmur. No MRG .  Pulses are 2+ & equal.            No carotid bruit. No JVD.  No abdominal bruits. No femoral bruits. Abdomen: Bowel sounds are positive, abdomen soft and non-tender without masses or                  Hernia's noted. Msk:  Back normal, normal gait. Normal strength and tone for age. Extremities: No clubbing, cyanosis or edema.  DP +1 Neuro: Alert and oriented X 3. Psych:  Good affect, responds appropriately  TELEMETRY: Reviewed telemetry pt in: Atrial fib with Paced Rhythm.  ASSESSMENT AND PLAN:  1. Presentation with visual changes and lightheadedness, no frank syncope. Pacemaker already interrogated without definite event, telemetry has been stable with atrial fibrillation. Echocardiogram is pending for reassessment of aortic stenosis, previously mild in 2009. Carotid Dopplers show only mild atherosclerotic plaque. She is therapeutic on Coumadin with INR 2.5, lessening the possibility of an embolic event. No definite cardiac etiology as yet.  2. Hypertension: Blood pressure is currently well-controlled on current medication regimen. She remains on medications as listed above along with an  ACE inhibitor. She is not orthostatic based on measurements.  Bettey Mare. Lyman Bishop NP [facility] 09/15/2011, 8:09 AM   Attending note:  Patient remains clinically stable. Assessment and plan modified based on my recommendations and findings. Followup echocardiogram is pending for reassessment of aortic stenosis. At this point no definite cardiac etiology of presentation.  Jonelle Sidle, M.D., F.A.C.C.

## 2011-09-16 LAB — URINE CULTURE: Colony Count: >=100000

## 2011-09-16 MED ORDER — CEPHALEXIN 500 MG PO CAPS: 500.0000 mg | ORAL_CAPSULE | Freq: Three times a day (TID) | ORAL | Status: AC

## 2011-09-16 NOTE — Progress Notes (Signed)
Discharge instructions reviewed with patient and patient's grand daughter, both voiced understanding. Discharge instructions given to patient. Called [facility] and verified prescriptions received, patient instructed to pick up prescription at [facility], patient voiced understanding. Patient in stable condition and transported out by this RN.

## 2011-09-16 NOTE — Progress Notes (Signed)
Subjective: She says she feels much better. She slept well last night. I discussed her situation with Dr. Diona Browner the attending cardiologist and he feels it would be okay for her to simply be off Coumadin temporarily until she can have her GI procedure. She has not had any further neurological problems, cardiac arrhythmias et Karie Soda.  Objective: Vital signs in last 24 hours: Temp:  [97.9 F (36.6 C)-98.3 F (36.8 C)] 98.1 F (36.7 C) (07/03 0601) Pulse Rate:  [71-81] 76  (07/03 0740) Resp:  [16-20] 20  (07/03 0601) BP: (101-138)/(56-72) 123/72 mmHg (07/03 0740) SpO2:  [2 %-97 %] 97 % (07/03 0740) Weight change:  Last BM Date: 09/15/11  Intake/Output from previous day: 07/02 0701 - 07/03 0700 In: 720 [P.O.:720] Out: 800 [Urine:800]  PHYSICAL EXAM General appearance: alert, cooperative and no distress Resp: clear to auscultation bilaterally Cardio: regular rate and rhythm, S1, S2 normal, no murmur, click, rub or gallop GI: soft, non-tender; bowel sounds normal; no masses,  no organomegaly Extremities: extremities normal, atraumatic, no cyanosis or edema  Lab Results:    Basic Metabolic Panel: No results found for this basename: NA:2,K:2,CL:2,CO2:2,GLUCOSE:2,BUN:2,CREATININE:2,CALCIUM:2,MG:2,PHOS:2 in the last 72 hours Liver Function Tests: No results found for this basename: AST:2,ALT:2,ALKPHOS:2,BILITOT:2,PROT:2,ALBUMIN:2 in the last 72 hours No results found for this basename: LIPASE:2,AMYLASE:2 in the last 72 hours No results found for this basename: AMMONIA:2 in the last 72 hours CBC: No results found for this basename: WBC:2,NEUTROABS:2,HGB:2,HCT:2,MCV:2,PLT:2 in the last 72 hours Cardiac Enzymes: No results found for this basename: CKTOTAL:3,CKMB:3,CKMBINDEX:3,TROPONINI:3 in the last 72 hours BNP: No results found for this basename: PROBNP:3 in the last 72 hours D-Dimer: No results found for this basename: DDIMER:2 in the last 72 hours CBG: No results found for  this basename: GLUCAP:6 in the last 72 hours Hemoglobin A1C: No results found for this basename: HGBA1C in the last 72 hours Fasting Lipid Panel: No results found for this basename: CHOL,HDL,LDLCALC,TRIG,CHOLHDL,LDLDIRECT in the last 72 hours Thyroid Function Tests: No results found for this basename: TSH,T4TOTAL,FREET4,T3FREE,THYROIDAB in the last 72 hours Anemia Panel: No results found for this basename: VITAMINB12,FOLATE,FERRITIN,TIBC,IRON,RETICCTPCT in the last 72 hours Coagulation:  Basename 09/16/11 0512 09/15/11 0513  LABPROT 27.4* 27.9*  INR 2.50* 2.56*   Urine Drug Screen: Drugs of Abuse  No results found for this basename: labopia, cocainscrnur, labbenz, amphetmu, thcu, labbarb    Alcohol Level: No results found for this basename: ETH:2 in the last 72 hours Urinalysis:  Basename 09/13/11 0940  COLORURINE YELLOW  LABSPEC 1.020  PHURINE 5.5  GLUCOSEU NEGATIVE  HGBUR NEGATIVE  BILIRUBINUR NEGATIVE  KETONESUR NEGATIVE  PROTEINUR NEGATIVE  UROBILINOGEN 0.2  NITRITE POSITIVE*  LEUKOCYTESUR TRACE*   Misc. Labs:  ABGS No results found for this basename: PHART,PCO2,PO2ART,TCO2,HCO3 in the last 72 hours CULTURES Recent Results (from the past 240 hour(s))  URINE CULTURE     Status: Normal   Collection Time   09/13/11 10:32 AM      Component Value Range Status Comment   Specimen Description URINE, CLEAN CATCH   Final    Special Requests NONE   Final    Culture  Setup Time 09/13/2011 20:31   Final    Colony Count >=100,000 COLONIES/ML   Final    Culture ESCHERICHIA COLI   Final    Report Status 09/16/2011 FINAL   Final    Organism ID, Bacteria ESCHERICHIA COLI   Final   WOUND CULTURE     Status: Normal (Preliminary result)   Collection Time   09/13/11  4:39 PM      Component Value Range Status Comment   Specimen Description NOSE RIGHT   Final    Special Requests NONE   Final    Gram Stain     Final    Value: NO WBC SEEN     RARE SQUAMOUS EPITHELIAL CELLS  PRESENT     RARE GRAM NEGATIVE RODS   Culture Culture reincubated for better growth   Final    Report Status PENDING   Incomplete    Studies/Results: Dg Esophagus  09/15/2011  *RADIOLOGY REPORT*  Clinical Data: Distal thoracic esophageal dysphagia for liquids and solids for 3 months  ESOPHAGUS/BARIUM SWALLOW/TABLET STUDY:  Technique:  Single contrast, air contrast, and tablet imaging of the esophagus were performed.  Fluoroscopy time:  3.7 minutes  Comparison: None  Findings: Well preserved esophageal motility for age. Smooth esophageal mucosa on air-contrast images without irregularity or ulceration. Small diverticulum from posterior wall of mid thoracic esophagus below the carina. No persistent intraluminal filling defects. Focal narrowing of the esophagus at the gastroesophageal junction, obstructing a 12.5 mm diameter barium tablet. Targeted rapid sequence imaging of the cervical esophagus hypopharynx shows no laryngeal penetration or aspiration. However the patient did experience an episode of coughing when swallowing water with the 12.5 mm diameter barium tablet. Left subclavian transvenous pacemaker leads project over right atrium and right ventricle. No esophageal reflux seen during exam.  IMPRESSION: Stricture of the distal esophagus at the gastroesophageal junction without identification of mass or wall irregularity, suggesting a benign stricture.  Original Report Authenticated By: Lollie Marrow, M.D.   US Carotid Duplex Bilateral  09/14/2011  *RADIOLOGY REPORT*  Clinical Data: Visual disturbance, TIA symptoms  BILATERAL CAROTID DUPLEX ULTRASOUND  Technique: Gray scale imaging, color Doppler and duplex ultrasound was performed of bilateral carotid and vertebral arteries in the neck.  Comparison:  None.  Criteria:  Quantification of carotid stenosis is based on velocity parameters that correlate the residual internal carotid diameter with NASCET-based stenosis levels, using the diameter of the  distal internal carotid lumen as the denominator for stenosis measurement.  The following velocity measurements were obtained:                   PEAK SYSTOLIC/END DIASTOLIC RIGHT ICA:                        92/15cm/sec CCA:                        91/11cm/sec SYSTOLIC ICA/CCA RATIO:     1.01 DIASTOLIC ICA/CCA RATIO:    1.32 ECA:                        74cm/sec  LEFT ICA:                        111/24cm/sec CCA:                        109/7cm/sec SYSTOLIC ICA/CCA RATIO:     1.02 DIASTOLIC ICA/CCA RATIO:    3.35 ECA:                        87cm/sec  Findings:  RIGHT CAROTID ARTERY: Scattered minor atherosclerotic changes.  No hemodynamically significant right ICA stenosis, velocity elevation, or turbulent flow.  RIGHT VERTEBRAL ARTERY:  Antegrade  LEFT  CAROTID ARTERY: Minor atherosclerotic changes and intimal thickening.  No hemodynamically significant left ICA stenosis, velocity elevation, or turbulent flow.  LEFT VERTEBRAL ARTERY:  Antegrade  IMPRESSION: Minor atherosclerotic changes.  No hemodynamically significant ICA stenosis on either side  Original Report Authenticated By: Judie Petit. Ruel Favors, M.D.    Medications:  Prior to Admission:  Prescriptions prior to admission  Medication Sig Dispense Refill  . acetaminophen (TYLENOL) 500 MG tablet Take 1,000 mg by mouth 4 (four) times daily.        . calcium-vitamin D (OSCAL WITH D) 500-200 MG-UNIT per tablet Take 1 tablet by mouth daily.        . furosemide (LASIX) 20 MG tablet Take 20 mg by mouth daily.      [name] lisinopril (PRINIVIL,ZESTRIL) 10 MG tablet Take 1 tablet (10 mg total) by mouth daily.  90 tablet  3  . metoprolol tartrate (LOPRESSOR) 25 MG tablet Take 1 tablet (25 mg total) by mouth 2 (two) times daily.  180 tablet  3  . Multiple Vitamins-Minerals (MULTIVITAMIN WITH MINERALS) tablet Take 1 tablet by mouth daily.        . pravastatin (PRAVACHOL) 40 MG tablet Take 40 mg by mouth daily.        . verapamil (CALAN-SR) 180 MG CR tablet Take 1 tablet (180  mg total) by mouth at bedtime.  90 tablet  3  . warfarin (COUMADIN) 2.5 MG tablet Take 2.5 mg by mouth daily.       Scheduled:   . acetaminophen  1,000 mg Oral Q6H  . cephALEXin  500 mg Oral Q8H  . furosemide  20 mg Oral Daily  . lisinopril  10 mg Oral Daily  . metoprolol tartrate  25 mg Oral BID  . mupirocin ointment   Nasal BID  . pantoprazole  40 mg Oral Q1200  . verapamil  180 mg Oral QHS  . DISCONTD: warfarin  2.5 mg Oral q1800  . DISCONTD: Warfarin - Physician Dosing Inpatient   Does not apply q1800   Continuous:  [id number]  Assesment: She had an episode of temporary blindness and is not totally clear what that was from. She has multiple other medical problems. She has an esophageal stricture and is going to need dilatation. She is on chronic Coumadin therapy and that we'll have to be discontinued Principal Problem:  *Amaurosis fugax, both eyes Active Problems:  HYPERLIPIDEMIA  HYPERTENSION  Arteriosclerotic cardiovascular disease (ASCVD)  Chronic anticoagulation  Sick sinus syndrome  Aortic stenosis  Left bundle branch block  Congestive heart failure  Chronic kidney disease  Gastroesophageal reflux disease  Chronic lymphocytic leukemia  Atrial fibrillation    Plan:discharge today    LOS: 3 days   Laporsche Hoeger L 09/16/2011, 8:34 AM

## 2011-09-16 NOTE — Discharge Summary (Signed)
Physician Discharge Summary  Patient ID: [name] MRN: [id number] DOB/AGE: 76-Jan-1917 76 y.o. Primary Care Physician:Jaspreet Hollings L, MD Admit date: 09/13/2011 Discharge date: 09/16/2011    Discharge Diagnoses:   Principal Problem:  *Amaurosis fugax, both eyes Active Problems:  HYPERLIPIDEMIA  HYPERTENSION  Arteriosclerotic cardiovascular disease (ASCVD)  Chronic anticoagulation  Sick sinus syndrome  Aortic stenosis  Left bundle branch block  Congestive heart failure  Chronic kidney disease  Gastroesophageal reflux disease  Chronic lymphocytic leukemia  Atrial fibrillation  Escherichia coli urinary tract infection Medication List  As of 09/16/2011  8:42 AM   ASK your doctor about these medications         acetaminophen 500 MG tablet   Commonly known as: TYLENOL   Take 1,000 mg by mouth 4 (four) times daily.      calcium-vitamin D 500-200 MG-UNIT per tablet   Commonly known as: OSCAL WITH D   Take 1 tablet by mouth daily.      furosemide 20 MG tablet   Commonly known as: LASIX   Take 20 mg by mouth daily.      lisinopril 10 MG tablet   Commonly known as: PRINIVIL,ZESTRIL   Take 1 tablet (10 mg total) by mouth daily.      metoprolol tartrate 25 MG tablet   Commonly known as: LOPRESSOR   Take 1 tablet (25 mg total) by mouth 2 (two) times daily.      multivitamin with minerals tablet   Take 1 tablet by mouth daily.      pravastatin 40 MG tablet   Commonly known as: PRAVACHOL   Take 40 mg by mouth daily.      verapamil 180 MG CR tablet   Commonly known as: CALAN-SR   Take 1 tablet (180 mg total) by mouth at bedtime.      warfarin 2.5 MG tablet   Commonly known as: COUMADIN   Take 2.5 mg by mouth daily.            Discharged Condition: Improved    Consults: Telemetry neurology/cardiology/GI  Significant Diagnostic Studies: Dg Chest 2 View  08/27/2011  *RADIOLOGY REPORT*  Clinical Data: Severe shortness of breath, history of COPD, also history  of chronic lymphocytic leukemia  CHEST - 2 VIEW  Comparison: Chest x-ray of 03/22/2009  Findings: No active infiltrate or effusion is seen.  Moderate cardiomegaly is stable with permanent pacemaker. Calcification of the mitral annulus is noted.  Prominent interstitial markings are stable.  The bones are osteopenic.  IMPRESSION: Stable cardiomegaly with permanent pacemaker.  No change in chronic interstitial lung disease.  No active process.  Original Report Authenticated By: Juline Patch, M.D.   Ct Head Wo Contrast  09/13/2011  *RADIOLOGY REPORT*  Clinical Data: Near syncope.  Chest pain.  Visual changes.  CT HEAD WITHOUT CONTRAST  Technique:  Contiguous axial images were obtained from the base of the skull through the vertex without contrast.  Comparison: Head CT 04/03/2003.  Brain MRI 11/16/2006.  Findings: A well-defined focus of low attenuation in the right cerebellar hemisphere is unchanged, compatible with an old cerebellar infarct.  There is mild cerebral and cerebellar atrophy which is age appropriate.  There are patchy and confluent areas of decreased attenuation throughout the deep and periventricular white matter of the cerebral hemispheres bilaterally, compatible with chronic microvascular ischemic changes.  No definite acute intracranial abnormality.  Specifically, no definite signs of acute/subacute cerebral ischemia, and no intraparenchymal hemorrhage, no focal mass, mass effect, hydrocephalus or  abnormal intra or extra-axial fluid collections.  No acute displaced skull fractures are identified.  The visualized paranasal sinuses and mastoids are well pneumatized, with the exception of a small polypoid density in the left maxillary sinus which may represent a mucosal retention cyst or polyp.  IMPRESSION: 1.  No acute intracranial abnormalities. 2.  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   Original Report Authenticated By: Florencia Reasons, M.D.   US Breast Right  09/02/2011  *RADIOLOGY REPORT*  Clinical Data:  Focal right breast pain  DIGITAL DIAGNOSTIC BILATERAL MAMMOGRAM WITH CAD AND RIGHT BREAST ULTRASOUND:  Comparison:  With priors  Findings:  There are scattered fibroglandular densities.  There is no new suspicious mass or malignant-type microcalcifications in either breast.  Spot tangential view of the area of clinical concern in the right breast is negative. Mammographic images were processed with CAD.  On physical exam, I do not palpate a discrete mass in the right breast.  Ultrasound is performed, showing normal tissue in area of clinical concern in the upper outer quadrant of the right breast.  IMPRESSION: No evidence of malignancy in either breast.  RECOMMENDATION: Bilateral screening mammogram in 1 year is recommended.  The importance of self breast examination was discussed with the patient.  BI-RADS CATEGORY 1:  Negative.  Original Report Authenticated By: Littie Deeds. Judyann Munson, M.D.   Dg Esophagus  09/15/2011  *RADIOLOGY REPORT*  Clinical Data: Distal thoracic esophageal dysphagia for liquids and solids for 3 months  ESOPHAGUS/BARIUM SWALLOW/TABLET STUDY:  Technique:  Single contrast, air contrast, and tablet imaging of the esophagus were performed.  Fluoroscopy time:  3.7 minutes  Comparison: None  Findings: Well preserved esophageal motility for age. Smooth esophageal mucosa on air-contrast images without irregularity or ulceration. Small diverticulum from posterior wall of mid thoracic esophagus below the carina. No persistent intraluminal filling defects. Focal narrowing of the esophagus at the gastroesophageal junction, obstructing a 12.5 mm diameter barium tablet. Targeted rapid sequence imaging of the cervical esophagus hypopharynx shows no laryngeal penetration or aspiration. However the patient did experience an episode of coughing when swallowing water with the 12.5 mm  diameter barium tablet. Left subclavian transvenous pacemaker leads project over right atrium and right ventricle. No esophageal reflux seen during exam.  IMPRESSION: Stricture of the distal esophagus at the gastroesophageal junction without identification of mass or wall irregularity, suggesting a benign stricture.  Original Report Authenticated By: Lollie Marrow, M.D.   US Carotid Duplex Bilateral  09/14/2011  *RADIOLOGY REPORT*  Clinical Data: Visual disturbance, TIA symptoms  BILATERAL CAROTID DUPLEX ULTRASOUND  Technique: Gray scale imaging, color Doppler and duplex ultrasound was performed of bilateral carotid and vertebral arteries in the neck.  Comparison:  None.  Criteria:  Quantification of carotid stenosis is based on velocity parameters that correlate the residual internal carotid diameter with NASCET-based stenosis levels, using the diameter of the distal internal carotid lumen as the denominator for stenosis measurement.  The following velocity measurements were obtained:                   PEAK SYSTOLIC/END DIASTOLIC RIGHT ICA:                        92/15cm/sec CCA:                        91/11cm/sec SYSTOLIC ICA/CCA RATIO:  1.01 DIASTOLIC ICA/CCA RATIO:    1.32 ECA:                        74cm/sec  LEFT ICA:                        111/24cm/sec CCA:                        109/7cm/sec SYSTOLIC ICA/CCA RATIO:     1.02 DIASTOLIC ICA/CCA RATIO:    3.35 ECA:                        87cm/sec  Findings:  RIGHT CAROTID ARTERY: Scattered minor atherosclerotic changes.  No hemodynamically significant right ICA stenosis, velocity elevation, or turbulent flow.  RIGHT VERTEBRAL ARTERY:  Antegrade  LEFT CAROTID ARTERY: Minor atherosclerotic changes and intimal thickening.  No hemodynamically significant left ICA stenosis, velocity elevation, or turbulent flow.  LEFT VERTEBRAL ARTERY:  Antegrade  IMPRESSION: Minor atherosclerotic changes.  No hemodynamically significant ICA stenosis on either side  Original  Report Authenticated By: Judie Petit. Ruel Favors, M.D.   Dg Chest Port 1 View  09/13/2011  *RADIOLOGY REPORT*  Clinical Data: Shortness of breath, chest pain, syncope  PORTABLE CHEST - 1 VIEW  Comparison: 08/27/2011  Findings: Left subclavian pacemaker stable in position.  Mild cardiomegaly.  Mild bibasilar interstitial prominence without overt interstitial edema.  No focal airspace consolidation.  No effusion.  IMPRESSION: 1.  No acute disease. 2.  Chronic cardiomegaly and bibasilar interstitial changes.  Original Report Authenticated By: Osa Craver, M.D.   Mm Digital Diagnostic Bilat  09/02/2011  *RADIOLOGY REPORT*  Clinical Data:  Focal right breast pain  DIGITAL DIAGNOSTIC BILATERAL MAMMOGRAM WITH CAD AND RIGHT BREAST ULTRASOUND:  Comparison:  With priors  Findings:  There are scattered fibroglandular densities.  There is no new suspicious mass or malignant-type microcalcifications in either breast.  Spot tangential view of the area of clinical concern in the right breast is negative. Mammographic images were processed with CAD.  On physical exam, I do not palpate a discrete mass in the right breast.  Ultrasound is performed, showing normal tissue in area of clinical concern in the upper outer quadrant of the right breast.  IMPRESSION: No evidence of malignancy in either breast.  RECOMMENDATION: Bilateral screening mammogram in 1 year is recommended.  The importance of self breast examination was discussed with the patient.  BI-RADS CATEGORY 1:  Negative.  Original Report Authenticated By: Littie Deeds. Judyann Munson, M.D.    Lab Results: Basic Metabolic Panel: No results found for this basename: NA:2,K:2,CL:2,CO2:2,GLUCOSE:2,BUN:2,CREATININE:2,CALCIUM:2,MG:2,PHOS:2 in the last 72 hours Liver Function Tests: No results found for this basename: AST:2,ALT:2,ALKPHOS:2,BILITOT:2,PROT:2,ALBUMIN:2 in the last 72 hours   CBC: No results found for this basename: WBC:2,NEUTROABS:2,HGB:2,HCT:2,MCV:2,PLT:2 in the  last 72 hours  Recent Results (from the past 240 hour(s))  URINE CULTURE     Status: Normal   Collection Time   09/13/11 10:32 AM      Component Value Range Status Comment   Specimen Description URINE, CLEAN CATCH   Final    Special Requests NONE   Final    Culture  Setup Time 09/13/2011 20:31   Final    Colony Count >=100,000 COLONIES/ML   Final    Culture ESCHERICHIA COLI   Final    Report Status 09/16/2011 FINAL   Final    Organism ID,  Bacteria ESCHERICHIA COLI   Final   WOUND CULTURE     Status: Normal (Preliminary result)   Collection Time   09/13/11  4:39 PM      Component Value Range Status Comment   Specimen Description NOSE RIGHT   Final    Special Requests NONE   Final    Gram Stain     Final    Value: NO WBC SEEN     RARE SQUAMOUS EPITHELIAL CELLS PRESENT     RARE GRAM NEGATIVE RODS   Culture     Final    Value: RARE STAPHYLOCOCCUS AUREUS     Note: RIFAMPIN AND GENTAMICIN SHOULD NOT BE USED AS SINGLE DRUGS FOR TREATMENT OF STAPH INFECTIONS.   Report Status PENDING   Incomplete      Hospital Course: She was admitted after temporary blindness of both eyes. When she came to the emergency room she was better but was brought into the hospital because of concerns this might have been an acute stroke. CT did not show a stroke and she's not a candidate for MRI because she has a pacemaker. She had carotid study that did not show any significant abnormalities. She was in atrial fibrillation throughout her hospitalization. She had cardiology consultation it was felt that perhaps some of the problem was related to her known aortic stenosis. She had teleneurology consultation and it was not felt that she had an acute neurological cause of her problem. She had no further abnormalities during her hospitalization. She did have urinary tract symptoms and grew Escherichia coli. She had a barium pill esophagogram and did not pass the barium pill. It is felt that she's going to need an esophageal  dilatation. I discussed her situation with Dr. Diona Browner her attending cardiologist and he feels that she is okay to go off Coumadin until the procedure can be done and then have resumption of Coumadin afterward.  Discharge Exam: Blood pressure 123/72, pulse 76, temperature 98.1 F (36.7 C), temperature source Oral, resp. rate 20, height 5' (1.524 m), weight 72.167 kg (159 lb 1.6 oz), SpO2 97.00%. She is awake and alert. Her chest is clear. She is in atrial fibrillation. She is neurologically intact  Disposition: Home  Discharge Orders    Future Appointments: Provider: Department: Dept Phone: Center:   09/21/2011 11:30 AM [facility] [phone] None   09/21/2011 4:00 PM Marinus Maw, MD [facility] [phone] [facility]   09/30/2011 10:40 AM Dyann Kief, PA [facility] [phone] [facility]   10/01/2011 3:00 PM [facility] [phone] [facility]   11/30/2011 11:30 AM [facility] [phone] None   12/01/2011 2:00 PM Randall An, MD [facility] [phone] None        Signed: [name] Pager [phone]  09/16/2011, 8:42 AM

## 2011-09-17 LAB — WOUND CULTURE

## 2011-09-18 ENCOUNTER — Telehealth (HOSPITAL_COMMUNITY): Payer: Self-pay | Admitting: Oncology

## 2011-09-21 ENCOUNTER — Encounter: Payer: Medicare Other | Admitting: Internal Medicine

## 2011-09-21 ENCOUNTER — Other Ambulatory Visit (INDEPENDENT_AMBULATORY_CARE_PROVIDER_SITE_OTHER): Payer: Self-pay | Admitting: *Deleted

## 2011-09-21 ENCOUNTER — Telehealth: Payer: Self-pay | Admitting: Cardiology

## 2011-09-21 ENCOUNTER — Encounter (HOSPITAL_COMMUNITY): Payer: Self-pay | Admitting: Pharmacy Technician

## 2011-09-21 ENCOUNTER — Other Ambulatory Visit (HOSPITAL_COMMUNITY): Payer: Medicare Other

## 2011-09-21 DIAGNOSIS — R1310 Dysphagia, unspecified: Secondary | ICD-10-CM

## 2011-09-21 NOTE — Telephone Encounter (Signed)
Spoke with pt.  She is scheduled for a GI procedure on Friday.  She was never aware if she needed to hold Coumadin or not but started holding last Tuesday.  Asked pt to call GI MD.  If she needs to hold Coumadin, will need to continue holding until procedure on Friday.  She has an appt to recheck INR on 7/17.  Unable to find where pt was cleared to hold Coumadin by cardiology.

## 2011-09-21 NOTE — Telephone Encounter (Signed)
Discussed lab from 6/30 with Dr. Mariel Sleet and patient does not need to come for blood work today.  Patient notified.

## 2011-09-21 NOTE — Telephone Encounter (Signed)
Patient was scheduled for procedure today but it was cancelled.  Patient wants to know what to do about Coumadin.  States that she has been off of Coumadin for 7 days and wants to know what to do. / tg

## 2011-09-25 ENCOUNTER — Ambulatory Visit (HOSPITAL_COMMUNITY)
Admission: RE | Admit: 2011-09-25 | Discharge: 2011-09-25 | Disposition: A | Discharge status: Home / Self Care | Payer: Medicare Other | Source: Ambulatory Visit | Attending: Internal Medicine | Admitting: Internal Medicine

## 2011-09-25 ENCOUNTER — Encounter (HOSPITAL_COMMUNITY)
Admission: RE | Disposition: A | Discharge status: Home / Self Care | Payer: Self-pay | Source: Ambulatory Visit | Attending: Internal Medicine

## 2011-09-25 ENCOUNTER — Encounter (HOSPITAL_COMMUNITY): Payer: Self-pay | Admitting: *Deleted

## 2011-09-25 DIAGNOSIS — D131 Benign neoplasm of stomach: Secondary | ICD-10-CM

## 2011-09-25 DIAGNOSIS — K319 Disease of stomach and duodenum, unspecified: Secondary | ICD-10-CM | POA: Insufficient documentation

## 2011-09-25 DIAGNOSIS — I2510 Atherosclerotic heart disease of native coronary artery without angina pectoris: Secondary | ICD-10-CM | POA: Insufficient documentation

## 2011-09-25 DIAGNOSIS — K449 Diaphragmatic hernia without obstruction or gangrene: Secondary | ICD-10-CM

## 2011-09-25 DIAGNOSIS — K2940 Chronic atrophic gastritis without bleeding: Secondary | ICD-10-CM | POA: Insufficient documentation

## 2011-09-25 DIAGNOSIS — Z7901 Long term (current) use of anticoagulants: Secondary | ICD-10-CM | POA: Insufficient documentation

## 2011-09-25 DIAGNOSIS — R1310 Dysphagia, unspecified: Secondary | ICD-10-CM

## 2011-09-25 DIAGNOSIS — Q398 Other congenital malformations of esophagus: Secondary | ICD-10-CM | POA: Insufficient documentation

## 2011-09-25 DIAGNOSIS — K222 Esophageal obstruction: Secondary | ICD-10-CM

## 2011-09-25 DIAGNOSIS — K225 Diverticulum of esophagus, acquired: Secondary | ICD-10-CM

## 2011-09-25 SURGERY — ESOPHAGOGASTRODUODENOSCOPY (EGD) WITH ESOPHAGEAL DILATION
Anesthesia: Moderate Sedation

## 2011-09-25 MED ORDER — STERILE WATER FOR IRRIGATION IR SOLN
Status: DC | PRN
  Administered 2011-09-25: 08:00:00

## 2011-09-25 MED ORDER — MEPERIDINE HCL 25 MG/ML IJ SOLN
INTRAMUSCULAR | Status: DC | PRN
  Administered 2011-09-25: 25 mg via INTRAVENOUS

## 2011-09-25 MED ORDER — MIDAZOLAM HCL 5 MG/5ML IJ SOLN
INTRAMUSCULAR | Status: DC | PRN
  Administered 2011-09-25 (×2): 1 mg via INTRAVENOUS
  Administered 2011-09-25: 2 mg via INTRAVENOUS

## 2011-09-25 MED ORDER — SODIUM CHLORIDE 0.45 % IV SOLN
Freq: Once | INTRAVENOUS | Status: AC
  Administered 2011-09-25: 08:00:00 via INTRAVENOUS

## 2011-09-25 MED ORDER — MEPERIDINE HCL 50 MG/ML IJ SOLN
INTRAMUSCULAR | Status: AC
  Filled 2011-09-25: qty 1

## 2011-09-25 MED ORDER — BUTAMBEN-TETRACAINE-BENZOCAINE 2-2-14 % EX AERO
INHALATION_SPRAY | CUTANEOUS | Status: DC | PRN
  Administered 2011-09-25: 2 via TOPICAL

## 2011-09-25 MED ORDER — MIDAZOLAM HCL 5 MG/5ML IJ SOLN
INTRAMUSCULAR | Status: AC
  Filled 2011-09-25: qty 10

## 2011-09-25 NOTE — Op Note (Signed)
EGD PROCEDURE REPORT  PATIENT:  MATISSE ROSKELLEY  MR#:  [id number] Birthdate:  1915-09-14, 78 y.o., female Endoscopist:  Dr. Malissa Hippo, MD Referred By:  Dr. Oneal Deputy. Juanetta Gosling, MD Procedure Date: 09/25/2011  Procedure:   EGD with ED.   Indications:  Patient is a 78 year old Caucasian female presents with dysphagia to solids and found to have high-grade distal esophageal stricture on barium pill study. Patient's warfarin is on hold for this procedure.            Informed Consent:  The risks, benefits, alternatives & imponderables which include, but are not limited to, bleeding, infection, perforation, drug reaction and potential missed lesion have been reviewed.  The potential for biopsy, lesion removal, esophageal dilation, etc. have also been discussed.  Questions have been answered.  All parties agreeable.  Please see history & physical in medical record for more information.  Medications:  Demerol 25 mg IV Versed 4 mg IV Cetacaine spray topically for oropharyngeal anesthesia  Description of procedure:  The endoscope was introduced through the mouth and advanced to the second portion of the duodenum without difficulty or limitations. The mucosal surfaces were surveyed very carefully during advancement of the scope and upon withdrawal.  Findings:  Esophagus:  Mucosa of the esophagus was normal. Mall esophageal diverticulum noted at 30 cm from the incisors. High-grade ring noted at GE junction initially disrupted by passing the scope. GEJ:  38 cm Hiatus:  40 cm Stomach:  Stomach was empty and distended very well with insufflation. Folds in the proximal stomach were normal. Salmon patient of mucosa revealed patchy erythema at antrum and pyloric channel. Pyloric channel was patent. Annularis fundus and cardia were examined by retroflexing the scope and were normal. Duodenum:  Mucosa of the  bulb was normal. Large circumferential polyp noted at angle of duodenum previously documented to be  an adenoma.. Scope was easily passed across this lesion and the second part of the duodenum. This lesion was not biopsied or treated.  Therapeutic/Diagnostic Maneuvers Performed:  Distal esophageal ring was dilated using a balloon. Balloon dilator was passed through the scope. Guidewire was pushed into gastric lumen. Balloon dilator was positioned across the ring and dilated initially to 15 mm and then to 16.5 and finally to 18 mm.  I was able to pass the balloon distally and insufflated position. The lumen was deflated and withdrawn. I was able to pass the scope across this segment without any resistance. Duodenal polyp was not biopsied or treated.  Complications:  None  Impression: Small diverticulum and esophageal body. High-grade distal esophageal ring dilated/disrupted with a balloon to 18 mm. Small sliding hiatal hernia. Nonerosive antral gastritis. Large circumferential duodenal polyp involving angle off duodenum.  Recommendations:  Patient can resume warfarin at usual dose this evening and get INR checked in 10 days. Will talk with patient and her son regarding management of enlarging duodenal adenoma.  REHMAN,NAJEEB U  09/25/2011  8:16 AM  CC: Dr. [name], MD & Dr. Bonnetta Barry ref. provider found

## 2011-09-25 NOTE — OR Nursing (Signed)
Dr. Karilyn Cota notified of patient having wide QRS on monitor. He compared with prior EKG and there are no changes.

## 2011-09-25 NOTE — H&P (Signed)
[name] is an 78 y.o. female.   Chief Complaint: Patient is here for esophagogastroduodenoscopy and esophageal dilation. HPI: Patient is a 78 year old Caucasian female who is hospitalized for amaurosis  fugax and was evaluated by me for dysphagia. She had barium pill study revealing stricture at GE junction on barium pill would not pass this area. She has had her esophagus dilated in the past. Since she was anticoagulated procedure had to be delayed until today. Patient also has history of duodenal adenoma. This would be evaluated on today's exam and may consider APC. Patient has been off warfarin for over 10 days. Details history could also be found in my consult from 09/14/2011.   Past Medical History  Diagnosis Date  . Sick sinus syndrome 11/2006    paroxysmal atrial fibrillation; dual-chamber pacemaker in 11/2006  . Aortic stenosis     mild  . Left bundle branch block   . Congestive heart failure     Congestive heart failure with normal ejection fraction  . Arteriosclerotic cardiovascular disease (ASCVD)     coronary angio in 10/09:50% left anterior descending; 80% PDA; medical therapy advisedOrthostatic decrease in blood pressure  . Epistaxis     mild with negative ENT evaluation  . Chronic kidney disease      Creatinine of 1.7 in 8/08, 1.4 in 10/08 and 1.5 in 11/09  . Dizziness     chronic  . Osteoarthritis   . Irritable bowel syndrome   . Urinary frequency     with incontinence  . Gastroesophageal reflux disease   . Rosacea   . Asthma     Childhood asthma  . Chronic lymphocytic leukemia     Stage I with anemia H./H. of 9/27 in 11/09 with normal MCV; iron deficiency in bone marrow  . Orthostatic hypotension   . Chronic anticoagulation   . Diverticulosis   . Adenomatous polyp October 2010    (Proximal Small bowel) With high grade dysplasia in polypoid lesion straddling D1/D2    Past Surgical History  Procedure Date  . Cholecystectomy   . Cervical spine surgery     . Lumbar disc surgery   . Bladder suspension   . Abdominal hysterectomy 1972  . A-v cardiac pacemaker insertion 11/2006    [employer]  . Cataract extraction     Bilateral  . Esophagogastroduodenoscopy 01/09/09    Rourk -noncritical Schatzki's ring, small hiatal hernia, fundal gland polyps, bile-stained gastric mucosa, polypoid lesion straddling D1 and D2 1-2 cm, localized lymphangitic-appearing mucosa at D2 biopsied (adenomatous polyp with high grade glandular dysplasia    Family History  Problem Relation Age of Onset  . Heart failure Father   . Cancer Father   . Diabetes Mother    Social History:  reports that she has never smoked. She has never used smokeless tobacco. She reports that she does not drink alcohol or use illicit drugs.  Allergies:  Allergies  Allergen Reactions  . Detrol (Tolterodine Tartrate) Other (See Comments)    Patient states: "I was told to never take it again, I do not recall the reaction"  . [place] (Trospium Chloride) Nausea Only and Other (See Comments)    Intense lower abd pain  . Sulfa Antibiotics Other (See Comments)    Extreme dizziness  . Sulfonamide Derivatives Other (See Comments)    REACTION: GI distress    Medications Prior to Admission  Medication Sig Dispense Refill  . acetaminophen (TYLENOL) 500 MG tablet Take 1,000 mg by mouth 4 (four) times daily.        [name]  calcium-vitamin D (OSCAL WITH D) 500-200 MG-UNIT per tablet Take 1 tablet by mouth daily.        . cephALEXin (KEFLEX) 500 MG capsule Take 1 capsule (500 mg total) by mouth every 8 (eight) hours.  21 capsule  0  . furosemide (LASIX) 20 MG tablet Take 20 mg by mouth daily.      [name] lisinopril (PRINIVIL,ZESTRIL) 10 MG tablet Take 1 tablet (10 mg total) by mouth daily.  90 tablet  3  . metoprolol tartrate (LOPRESSOR) 25 MG tablet Take 1 tablet (25 mg total) by mouth 2 (two) times daily.  180 tablet  3  . Multiple Vitamins-Minerals (MULTIVITAMIN WITH MINERALS) tablet Take 1 tablet by mouth  daily.        . pravastatin (PRAVACHOL) 40 MG tablet Take 40 mg by mouth daily.        . verapamil (CALAN-SR) 180 MG CR tablet Take 1 tablet (180 mg total) by mouth at bedtime.  90 tablet  3    No results found for this or any previous visit (from the past 48 hour(s)). No results found.  ROS  Blood pressure 165/72, pulse 78, temperature 98.1 F (36.7 C), temperature source Oral, resp. rate 18, height 5' (1.524 m), weight 159 lb (72.122 kg), SpO2 96.00%. Physical Exam  Constitutional: She appears well-developed and well-nourished.  HENT:  Mouth/Throat: Oropharynx is clear and moist.  Eyes: Conjunctivae are normal. No scleral icterus.  Neck: No thyromegaly present.  Cardiovascular:       Regular rhythm normal S1 and S2. Loud systolic ejection murmur at the aortic area.  Respiratory: Effort normal and breath sounds normal.  GI: Soft. She exhibits no distension and no mass. There is no tenderness.  Musculoskeletal: She exhibits no edema.  Lymphadenopathy:    She has no cervical adenopathy.  Neurological: She is alert.  Skin: Skin is warm and dry.     Assessment/Plan Dysphagia. Distal esophageal stricture. History of duodenal adenoma. EGD with ED and possible APC of duodenal adenoma.  REHMAN,NAJEEB U 09/25/2011, 7:45 AM

## 2011-09-30 ENCOUNTER — Ambulatory Visit (INDEPENDENT_AMBULATORY_CARE_PROVIDER_SITE_OTHER): Payer: Medicare Other | Admitting: Physician Assistant

## 2011-09-30 ENCOUNTER — Ambulatory Visit (INDEPENDENT_AMBULATORY_CARE_PROVIDER_SITE_OTHER): Payer: Medicare Other | Admitting: *Deleted

## 2011-09-30 ENCOUNTER — Encounter: Payer: Self-pay | Admitting: Physician Assistant

## 2011-09-30 VITALS — BP 130/60 | HR 68 | Ht 60.0 in | Wt 160.0 lb

## 2011-09-30 DIAGNOSIS — I350 Nonrheumatic aortic (valve) stenosis: Secondary | ICD-10-CM

## 2011-09-30 DIAGNOSIS — I10 Essential (primary) hypertension: Secondary | ICD-10-CM

## 2011-09-30 DIAGNOSIS — R0602 Shortness of breath: Secondary | ICD-10-CM

## 2011-09-30 DIAGNOSIS — Z7901 Long term (current) use of anticoagulants: Secondary | ICD-10-CM

## 2011-09-30 DIAGNOSIS — I359 Nonrheumatic aortic valve disorder, unspecified: Secondary | ICD-10-CM

## 2011-09-30 DIAGNOSIS — H3400 Transient retinal artery occlusion, unspecified eye: Secondary | ICD-10-CM

## 2011-09-30 DIAGNOSIS — G453 Amaurosis fugax: Secondary | ICD-10-CM

## 2011-09-30 LAB — POCT INR: INR: 1.4

## 2011-09-30 NOTE — Progress Notes (Signed)
HPI:  This is a very pleasant 78 year old white female patient who had a recent hospitalization after temporary blindness of both eyes. It was felt that perhaps some of the problem was related to her known aortic stenosis which he is being treated medically for because of her advanced age. She had a CT scan that showed no stroke but was not a candidate for an MRI because of her pacemaker. Carotids stay did not show any significant abnormality. She was in atrial fibrillation throughout her hospitalization. She had neurology consult and was not felt to have an acute neurological event. She was treated for a UTI and a barium pill esophagram and she did not pass the barium pill. Her Coumadin was held for esophageal dilatation which wasn't performed until this past Friday. She was off Coumadin for 10 days and her INR is 1.4 today.  The patient had 2 episodes of shortness of breath and palpitations that occur early in the morning when she first gets up. The episodes don't last long. She denies any associated chest pain, dizziness, or presyncope. She hasn't had any further blind spells.These episodes have scared her as well as the  temporary blindness that she experienced. She still lives independently in her own home and drives. The patient states she takes her first Lopressor dose at 12 noon and then at 11:00 at night. She also takes her verapamil at 11:00 at night.   Allergies:  -- Detrol (Tolterodine Tartrate) -- Other (See Comments)   --  Patient states: "I was told to never take it            again, I do not recall the reaction"  -- Sanctura (Trospium Chloride) -- Nausea Only and Other (See                           Comments)   --  Intense lower abd pain  -- Sulfa Antibiotics -- Other (See Comments)   --  Extreme dizziness  -- Sulfonamide Derivatives -- Other (See Comments)   --  REACTION: GI distress  Current Outpatient Prescriptions on File Prior to Visit: acetaminophen (TYLENOL) 500 MG tablet,  Take 1,000 mg by mouth 4 (four) times daily.  , Disp: , Rfl:  calcium-vitamin D (OSCAL WITH D) 500-200 MG-UNIT per tablet, Take 1 tablet by mouth daily.  , Disp: , Rfl:  furosemide (LASIX) 20 MG tablet, Take 20 mg by mouth daily., Disp: , Rfl:  lisinopril (PRINIVIL,ZESTRIL) 10 MG tablet, Take 1 tablet (10 mg total) by mouth daily., Disp: 90 tablet, Rfl: 3 metoprolol tartrate (LOPRESSOR) 25 MG tablet, Take 1 tablet (25 mg total) by mouth 2 (two) times daily., Disp: 180 tablet, Rfl: 3 Multiple Vitamins-Minerals (MULTIVITAMIN WITH MINERALS) tablet, Take 1 tablet by mouth daily.  , Disp: , Rfl:  pravastatin (PRAVACHOL) 40 MG tablet, Take 40 mg by mouth daily.  , Disp: , Rfl:  verapamil (CALAN-SR) 180 MG CR tablet, Take 1 tablet (180 mg total) by mouth at bedtime., Disp: 90 tablet, Rfl: 3  Current Facility-Administered Medications on File Prior to Visit: epoetin alfa (EPOGEN,PROCRIT) injection 40,000 Units, 40,000 Units, Subcutaneous, Once, Randall An, MD    Past Medical History:   Sick sinus syndrome                             11/2006         Comment:paroxysmal atrial fibrillation; dual-chamber  pacemaker in 11/2006   Aortic stenosis                                                Comment:mild   Left bundle branch block                                     Congestive heart failure                                       Comment:Congestive heart failure with normal ejection               fraction   Arteriosclerotic cardiovascular disease (ASCVD)                Comment:coronary angio in 10/09:50% left anterior               descending; 80% PDA; medical therapy               advisedOrthostatic decrease in blood pressure   Epistaxis                                                      Comment:mild with negative ENT evaluation   Chronic kidney disease                                         Comment: Creatinine of 1.7 in 8/08, 1.4 in 10/08 and               1.5 in 11/09    Dizziness                                                      Comment:chronic   Osteoarthritis                                               Irritable bowel syndrome                                     Urinary frequency                                              Comment:with incontinence   Gastroesophageal reflux disease                              Rosacea  Asthma                                                         Comment:Childhood asthma   Chronic lymphocytic leukemia                                   Comment:Stage I with anemia H./H. of 9/27 in 11/09 with              normal MCV; iron deficiency in bone marrow   Orthostatic hypotension                                      Chronic anticoagulation                                      Diverticulosis                                               Adenomatous polyp                               October 2*     Comment:(Proximal Small bowel) With high grade               dysplasia in polypoid lesion straddling D1/D2  Past Surgical History:   CHOLECYSTECTOMY                                              CERVICAL SPINE SURGERY                                       LUMBAR DISC SURGERY                                          BLADDER SUSPENSION                                           ABDOMINAL HYSTERECTOMY                          1972         A-V CARDIAC PACEMAKER INSERTION                 11/2006         Comment:[employer]   CATARACT EXTRACTION  Comment:Bilateral   ESOPHAGOGASTRODUODENOSCOPY                      01/09/09       Comment:Rourk -noncritical Schatzki's ring, small               hiatal hernia, fundal gland polyps,               bile-stained gastric mucosa, polypoid lesion               straddling D1 and D2 1-2 cm, localized               lymphangitic-appearing mucosa at D2 biopsied               (adenomatous polyp with high  grade glandular               dysplasia  Review of patient's family history indicates:   Heart failure                  Father                   Cancer                         Father                   Diabetes                       Mother                   Social History   Marital Status: Widowed             Spouse Name:                      Years of Education:                 Number of children:             Occupational History Occupation          Associate Professor            Comment              retired                                   Social History Main Topics   Smoking Status: Never Smoker                     Smokeless Status: Never Used                       Alcohol Use: No             Drug Use: No             Sexual Activity: No                 Other Topics            Concern   None on file  Social History Narrative   None on file    ROS:see history of present illness otherwise negative   PHYSICAL EXAM: Well-nournished, in no acute distress.Tearful Neck:murmur radiating to her carotids,No JVD, HJR, Bruit, or thyroid enlargement  Lungs: No tachypnea,  clear without wheezing, rales, or rhonchi  Cardiovascular: RRR, PMI not displaced, normal S1 decreased S2 with a 3-4/6 harsh systolic murmur at the right sternal border and left sternal border, no bruit, thrill, or heave.  Abdomen: BS normal. Soft without organomegaly, masses, lesions or tenderness.  Extremities: without cyanosis, clubbing or edema. Good distal pulses bilateral  SKin: Warm, no lesions or rashes   Musculoskeletal: No deformities  Neuro: no focal signs  BP 130/60  Pulse 68  Ht 5' (1.524 m)  Wt 160 lb (72.576 kg)  BMI 31.25 kg/m2    ZOX:WRUEA rhythm

## 2011-09-30 NOTE — Assessment & Plan Note (Signed)
I suspect most her symptoms are coming from her aortic stenosis. She is adamant about living independently as long as she can but is tearful in the office today because of her recent symptoms. I have asked her to take her Lopressor earlier in the morning to see if this makes a difference. She may need an adjustment in her medications if this doesn't work.

## 2011-09-30 NOTE — Assessment & Plan Note (Signed)
Patient was off her Coumadin for 10 days because of waiting for her esophageal dilatation. She is back on her Coumadin now and it was adjusted today.

## 2011-09-30 NOTE — Assessment & Plan Note (Addendum)
Resolved and without recurrence

## 2011-09-30 NOTE — Patient Instructions (Signed)
**Note De-Identified Cross Jorge Obfuscation** Your physician has recommended you make the following change in your medication: take morning dose of Lopressor in the am after breakfast  Your physician recommends that you schedule a follow-up appointment in: 2 to 3 weeks with Dr. Dietrich Pates

## 2011-09-30 NOTE — Assessment & Plan Note (Signed)
Patient has had 2 episodes of shortness of breath and rapid heartbeat when she first gets up in the morning. I asked her to take her Lopressor with breakfast or when these episodes occur. If they continue we will place an event recorder on her.

## 2011-10-12 ENCOUNTER — Ambulatory Visit (INDEPENDENT_AMBULATORY_CARE_PROVIDER_SITE_OTHER): Payer: Medicare Other | Admitting: *Deleted

## 2011-10-12 DIAGNOSIS — Z7901 Long term (current) use of anticoagulants: Secondary | ICD-10-CM

## 2011-10-12 LAB — POCT INR: INR: 3.3

## 2011-10-14 ENCOUNTER — Encounter: Payer: Self-pay | Admitting: *Deleted

## 2011-10-14 ENCOUNTER — Ambulatory Visit (INDEPENDENT_AMBULATORY_CARE_PROVIDER_SITE_OTHER): Payer: Medicare Other | Admitting: Cardiology

## 2011-10-14 ENCOUNTER — Encounter: Payer: Self-pay | Admitting: Cardiology

## 2011-10-14 VITALS — BP 160/60 | HR 91 | Ht 60.0 in | Wt 168.0 lb

## 2011-10-14 DIAGNOSIS — I350 Nonrheumatic aortic (valve) stenosis: Secondary | ICD-10-CM

## 2011-10-14 DIAGNOSIS — N189 Chronic kidney disease, unspecified: Secondary | ICD-10-CM

## 2011-10-14 DIAGNOSIS — Z7901 Long term (current) use of anticoagulants: Secondary | ICD-10-CM

## 2011-10-14 DIAGNOSIS — I359 Nonrheumatic aortic valve disorder, unspecified: Secondary | ICD-10-CM

## 2011-10-14 DIAGNOSIS — I10 Essential (primary) hypertension: Secondary | ICD-10-CM

## 2011-10-14 DIAGNOSIS — G453 Amaurosis fugax: Secondary | ICD-10-CM

## 2011-10-14 DIAGNOSIS — R0602 Shortness of breath: Secondary | ICD-10-CM

## 2011-10-14 DIAGNOSIS — K219 Gastro-esophageal reflux disease without esophagitis: Secondary | ICD-10-CM

## 2011-10-14 DIAGNOSIS — H3400 Transient retinal artery occlusion, unspecified eye: Secondary | ICD-10-CM

## 2011-10-14 MED ORDER — NITROGLYCERIN 0.4 MG SL SUBL: 0.4000 mg | SUBLINGUAL_TABLET | SUBLINGUAL | Status: DC | PRN

## 2011-10-14 MED ORDER — LISINOPRIL 20 MG PO TABS: 20.0000 mg | ORAL_TABLET | Freq: Every day | ORAL | Status: DC

## 2011-10-14 NOTE — Assessment & Plan Note (Addendum)
Patient returns a list of 3 home blood pressure determinations over the past few weeks;  Systolic pressure 144 on one occasion; otherwise pressure is normal.  Systolic mildly elevated in office today.

## 2011-10-14 NOTE — Assessment & Plan Note (Addendum)
Recent echocardiogram suggests no worse than moderate aortic stenosis.  Symptoms are of concern for congestive heart failure, but exam is relatively benign.  She does not appear to have critical aortic stenosis.  I would not be inclined to proceed with invasive evaluation and treatment unless it is more likely than not that symptoms are related to aortic valve disease, and that those symptoms are substantially limiting.

## 2011-10-14 NOTE — Assessment & Plan Note (Addendum)
Patient continues to do well on anticoagulation with no evidence for blood loss and stable and therapeutic drug effect.  INR was down to 1.4 when warfarin was held prior to esophageal dilatation, but is 3.3 today.  We will continue to manage dosing of her anticoagulant.

## 2011-10-14 NOTE — Patient Instructions (Signed)
Your physician recommends that you schedule a follow-up appointment in: 1 month  Your physician recommends that you return for lab work in: 3 weeks  Your physician has recommended you make the following change in your medication:  1 - Take lasix 20 mg daily in the am 2 - INCREASE Lisinopril to 20 mg daily

## 2011-10-14 NOTE — Progress Notes (Signed)
Patient ID: [name], female   DOB: [birth year], 78 y.o.   MRN: [id number]  HPI: Scheduled return visit for this inspiring 78 year old woman with recent congestive heart failure and aortic stenosis.  By echocardiography, her AS is no worse than moderate.  Exam is also relatively unimpressive.  She was seen a few weeks ago for left upper abdominal pain that radiated up the chest and to the jaw associated with increased dyspnea on exertion, particularly when she awakens in the morning.  She has been taking furosemide on an as-needed basis with a weight of 164 pounds as the home criterion.  Maintenance of that weight has required only occasional use of diuretic.  Nonetheless, she reports nocturia multiple times per night.  Prior to Admission medications   Medication Sig Start Date End Date Taking? Authorizing Provider  acetaminophen (TYLENOL) 500 MG tablet Take 1,000 mg by mouth 4 (four) times daily.     Yes Historical Provider, MD  calcium-vitamin D (OSCAL WITH D) 500-200 MG-UNIT per tablet Take 1 tablet by mouth daily.     Yes Historical Provider, MD  furosemide (LASIX) 20 MG tablet Take 20 mg by mouth daily. 10/06/10  Yes Jodelle Gross, NP  lisinopril (PRINIVIL,ZESTRIL) 10 MG tablet Take 1 tablet (10 mg total) by mouth daily. 09/03/11  Yes Kathlen Brunswick, MD  metoprolol tartrate (LOPRESSOR) 25 MG tablet Take 1 tablet (25 mg total) by mouth 2 (two) times daily. 09/03/11  Yes Kathlen Brunswick, MD  Multiple Vitamins-Minerals (MULTIVITAMIN WITH MINERALS) tablet Take 1 tablet by mouth daily.     Yes Historical Provider, MD  pravastatin (PRAVACHOL) 40 MG tablet Take 40 mg by mouth daily.     Yes Historical Provider, MD  verapamil (CALAN-SR) 180 MG CR tablet Take 1 tablet (180 mg total) by mouth at bedtime. 09/03/11  Yes Kathlen Brunswick, MD  warfarin (COUMADIN) 2.5 MG tablet Take 2.5 mg by mouth as directed.   Yes Historical Provider, MD   Allergies  Allergen Reactions  . Detrol (Tolterodine  Tartrate) Other (See Comments)    Patient states: "I was told to never take it again, I do not recall the reaction"  . Sanctura (Trospium Chloride) Nausea Only and Other (See Comments)    Intense lower abd pain  . Sulfa Antibiotics Other (See Comments)    Extreme dizziness  . Sulfonamide Derivatives Other (See Comments)    REACTION: GI distress     Past medical history, social history, and family history reviewed and updated.  ROS: Denies orthopnea, PND, palpitations, lightheadedness or syncope.  She maintained some pedal edema, but this does not sound as if it is severe.  All other systems reviewed and are negative.  PHYSICAL EXAM: BP 160/60  Pulse 91  Ht 5' (1.524 m)  Wt 76.204 kg (168 lb)  BMI 32.81 kg/m2  SpO2 96%  General-Well developed; no acute distress; extremely sharp mentally Body habitus-Mildly to moderately overweight Neck-No JVD; Transmitted murmur bilaterally to the carotids with good upstrokes Lungs-clear lung fields; resonant to percussion; mild kyphosis Cardiovascular-normal PMI; normal S1 and decreased A2; grade 3/6 mid-peaking systolic ejection murmur at the cardiac base Abdomen-normal bowel sounds; soft and non-tender without masses or organomegaly Musculoskeletal-No deformities, no cyanosis or clubbing Neurologic-Normal cranial nerves; symmetric strength and tone Skin-Warm, no significant lesions Extremities-distal pulses intact; 1/2+ ankle edema  ASSESSMENT AND PLAN:  Pana Bing, MD 10/14/2011 2:50 PM

## 2011-10-14 NOTE — Progress Notes (Deleted)
Name: [name]    DOB: 03/05/1916  Age: 78 y.o.  MR#: [id number]       PCP:  [name], MD      Insurance: @PAYORNAME @   CC:    Chief Complaint  Patient presents with  . Shortness of Breath    Med list reconcilled/TC  . Hypertension    VS BP 160/60  Pulse 91  Ht 5' (1.524 m)  Wt 168 lb (76.204 kg)  BMI 32.81 kg/m2  SpO2 96%  Weights Current Weight  10/14/11 168 lb (76.204 kg)  09/30/11 160 lb (72.576 kg)  09/25/11 159 lb (72.122 kg)    Blood Pressure  BP Readings from Last 3 Encounters:  10/14/11 160/60  09/30/11 130/60  09/25/11 147/56     Admit date:  (Not on file) Last encounter with RMR:  09/21/2011   Allergy Allergies  Allergen Reactions  . Detrol (Tolterodine Tartrate) Other (See Comments)    Patient states: "I was told to never take it again, I do not recall the reaction"  . Sanctura (Trospium Chloride) Nausea Only and Other (See Comments)    Intense lower abd pain  . Sulfa Antibiotics Other (See Comments)    Extreme dizziness  . Sulfonamide Derivatives Other (See Comments)    REACTION: GI distress    Current Outpatient Prescriptions  Medication Sig Dispense Refill  . acetaminophen (TYLENOL) 500 MG tablet Take 1,000 mg by mouth 4 (four) times daily.        . calcium-vitamin D (OSCAL WITH D) 500-200 MG-UNIT per tablet Take 1 tablet by mouth daily.        . furosemide (LASIX) 20 MG tablet Take 20 mg by mouth daily.      [name] lisinopril (PRINIVIL,ZESTRIL) 10 MG tablet Take 1 tablet (10 mg total) by mouth daily.  90 tablet  3  . metoprolol tartrate (LOPRESSOR) 25 MG tablet Take 1 tablet (25 mg total) by mouth 2 (two) times daily.  180 tablet  3  . Multiple Vitamins-Minerals (MULTIVITAMIN WITH MINERALS) tablet Take 1 tablet by mouth daily.        . pravastatin (PRAVACHOL) 40 MG tablet Take 40 mg by mouth daily.        . verapamil (CALAN-SR) 180 MG CR tablet Take 1 tablet (180 mg total) by mouth at bedtime.  90 tablet  3  . warfarin (COUMADIN) 2.5 MG  tablet Take 2.5 mg by mouth as directed.       No current facility-administered medications for this visit.   Facility-Administered Medications Ordered in Other Visits  Medication Dose Route Frequency Provider Last Rate Last Dose  . epoetin alfa (EPOGEN,PROCRIT) injection 40,000 Units  40,000 Units Subcutaneous Once Randall An, MD        Discontinued Meds:   There are no discontinued medications.  Patient Active Problem List  Diagnosis  . HYPERLIPIDEMIA  . HYPERTENSION  . Arteriosclerotic cardiovascular disease (ASCVD)  . Chronic anticoagulation  . Sick sinus syndrome  . Aortic stenosis  . Left bundle branch block  . Congestive heart failure  . Chronic kidney disease  . Gastroesophageal reflux disease  . Chronic lymphocytic leukemia  . Amaurosis fugax, both eyes  . Atrial fibrillation  . Shortness of breath    LABS Anti-coag visit on 10/12/2011  Component Date Value  . INR 10/12/2011 3.3   Anti-coag visit on 09/30/2011  Component Date Value  . INR 09/30/2011 1.4   Admission on 09/13/2011, Discharged on 09/16/2011  Component Date Value  .  WBC 09/13/2011 25.6*  . RBC 09/13/2011 3.49*  . Hemoglobin 09/13/2011 11.1*  . HCT 09/13/2011 34.4*  . MCV 09/13/2011 98.6   . [facility] 09/13/2011 31.8   . MCHC 09/13/2011 32.3   . RDW 09/13/2011 13.1   . Platelets 09/13/2011 227   . Neutrophils Relative 09/13/2011 41*  . Neutro Abs 09/13/2011 10.5*  . Lymphocytes Relative 09/13/2011 53*  . Lymphs Abs 09/13/2011 13.6*  . Monocytes Relative 09/13/2011 3   . Monocytes Absolute 09/13/2011 0.8   . Eosinophils Relative 09/13/2011 2   . Eosinophils Absolute 09/13/2011 0.6   . Basophils Relative 09/13/2011 0   . Basophils Absolute 09/13/2011 0.1   . WBC Morphology 09/13/2011 WHITE COUNT CONFIRMED ON SMEAR   . Sodium 09/13/2011 140   . Potassium 09/13/2011 3.8   . Chloride 09/13/2011 104   . CO2 09/13/2011 26   . Glucose, Bld 09/13/2011 148*  . BUN 09/13/2011 31*  .  Creatinine, Ser 09/13/2011 1.12*  . Calcium 09/13/2011 9.6   . Total Protein 09/13/2011 6.8   . Albumin 09/13/2011 3.7   . AST 09/13/2011 21   . ALT 09/13/2011 13   . Alkaline Phosphatase 09/13/2011 99   . Total Bilirubin 09/13/2011 0.3   . GFR calc non Af Amer 09/13/2011 41*  . GFR calc Af Amer 09/13/2011 48*  . Troponin I 09/13/2011 <0.30   . Pro B Natriuretic peptid* 09/13/2011 580.6*  . Color, Urine 09/13/2011 YELLOW   . APPearance 09/13/2011 CLEAR   . Specific Gravity, Urine 09/13/2011 1.020   . pH 09/13/2011 5.5   . Glucose, UA 09/13/2011 NEGATIVE   . Hgb urine dipstick 09/13/2011 NEGATIVE   . Bilirubin Urine 09/13/2011 NEGATIVE   . Ketones, ur 09/13/2011 NEGATIVE   . Protein, ur 09/13/2011 NEGATIVE   . Urobilinogen, UA 09/13/2011 0.2   . Nitrite 09/13/2011 POSITIVE*  . Leukocytes, UA 09/13/2011 TRACE*  . aPTT 09/13/2011 44*  . Prothrombin Time 09/13/2011 29.5*  . INR 09/13/2011 2.75*  . Squamous Epithelial / LPF 09/13/2011 RARE   . WBC, UA 09/13/2011 3-6   . RBC / HPF 09/13/2011 0-2   . Bacteria, UA 09/13/2011 MANY*  . Specimen Description 09/13/2011 URINE, CLEAN CATCH   . Special Requests 09/13/2011 NONE   . Culture  Setup Time 09/13/2011 09/13/2011 20:31   . Colony Count 09/13/2011 >=100,000 COLONIES/ML   . Culture 09/13/2011 ESCHERICHIA COLI   . Report Status 09/13/2011 09/16/2011 FINAL   . Organism ID, Bacteria 09/13/2011 ESCHERICHIA COLI   . Specimen Description 09/13/2011 WOUND RIGHT NOSE   . Special Requests 09/13/2011 NONE   . Gram Stain 09/13/2011                     Value:NO WBC SEEN                         RARE SQUAMOUS EPITHELIAL CELLS PRESENT                         RARE GRAM NEGATIVE RODS  . Culture 09/13/2011                     Value:RARE METHICILLIN RESISTANT STAPHYLOCOCCUS AUREUS                         Note: RIFAMPIN AND GENTAMICIN SHOULD NOT BE USED AS SINGLE DRUGS FOR TREATMENT OF STAPH INFECTIONS. This  organism DOES NOT demonstrate inducible  Clindamycin resistance in vitro.  . Report Status 09/13/2011 09/17/2011 FINAL   . Organism ID, Bacteria 09/13/2011 METHICILLIN RESISTANT STAPHYLOCOCCUS AUREUS   . Prothrombin Time 09/15/2011 27.9*  . INR 09/15/2011 2.56*  . Prothrombin Time 09/16/2011 27.4*  . INR 09/16/2011 2.50*  Anti-coag visit on 09/03/2011  Component Date Value  . INR 09/03/2011 2.9   Infusion on 08/21/2011  Component Date Value  . WBC 08/21/2011 20.3*  . RBC 08/21/2011 3.43*  . Hemoglobin 08/21/2011 10.8*  . HCT 08/21/2011 33.4*  . MCV 08/21/2011 97.4   . [facility] 08/21/2011 31.5   . MCHC 08/21/2011 32.3   . RDW 08/21/2011 13.4   . Platelets 08/21/2011 224   . Neutrophils Relative 08/21/2011 31*  . Neutro Abs 08/21/2011 6.4   . Lymphocytes Relative 08/21/2011 62*  . Lymphs Abs 08/21/2011 12.5*  . Monocytes Relative 08/21/2011 4   . Monocytes Absolute 08/21/2011 0.8   . Eosinophils Relative 08/21/2011 3   . Eosinophils Absolute 08/21/2011 0.6   . Basophils Relative 08/21/2011 0   . Basophils Absolute 08/21/2011 0.1   . WBC Morphology 08/21/2011 ATYPICAL LYMPHOCYTES   Anti-coag visit on 08/06/2011  Component Date Value  . INR 08/06/2011 1.9   Infusion on 07/20/2011  Component Date Value  . WBC 07/20/2011 22.4*  . RBC 07/20/2011 3.28*  . Hemoglobin 07/20/2011 10.5*  . HCT 07/20/2011 32.0*  . MCV 07/20/2011 97.6   . [facility] 07/20/2011 32.0   . MCHC 07/20/2011 32.8   . RDW 07/20/2011 13.9   . Platelets 07/20/2011 215   . Neutrophils Relative 07/20/2011 40*  . Neutro Abs 07/20/2011 8.9*  . Lymphocytes Relative 07/20/2011 54*  . Lymphs Abs 07/20/2011 12.0*  . Monocytes Relative 07/20/2011 5   . Monocytes Absolute 07/20/2011 1.1*  . Eosinophils Relative 07/20/2011 2   . Eosinophils Absolute 07/20/2011 0.4   . Basophils Relative 07/20/2011 0   . Basophils Absolute 07/20/2011 0.1   . WBC Morphology 07/20/2011 ATYPICAL LYMPHOCYTES      Results for this Opt Visit:     Results for orders placed in visit  on 10/12/11  POCT INR      Component Value Range   INR 3.3      EKG Orders placed in visit on 09/30/11  . EKG 12-LEAD     Prior Assessment and Plan Problem List as of 10/14/2011            Cardiology Problems   HYPERLIPIDEMIA   Last Assessment & Plan Note   04/27/2011 Office Visit Signed 04/27/2011  5:41 PM by Kathlen Brunswick, MD    Low total and LDL cholesterol with low HDL and mildly elevated triglycerides.   Lipid profile is suboptimal, but additional pharmacologic therapy is not necessary.  Current treatment with low-dose simvastatin appears adequately effective; however, concomitant administration with verapamil is not recommended.  Pravastatin will be substituted.    HYPERTENSION   Last Assessment & Plan Note   04/27/2011 Office Visit Addendum 05/01/2011  6:15 PM by Kathlen Brunswick, MD    Blood pressure control is inadequate at this visit although patient reports systolics below 140 at home.   She will bring a list of at least 10 measurements from to her next office visit.     Arteriosclerotic cardiovascular disease (ASCVD)   Last Assessment & Plan Note   04/27/2011 Office Visit Addendum 05/01/2011  6:11 PM by Kathlen Brunswick, MD    Current medical therapy is appropriate  for coronary artery disease with essentially single vessel disease.  Lipid profile is pending to assess adequacy of current lipid-lowering therapy.    Sick sinus syndrome   Last Assessment & Plan Note   04/27/2011 Office Visit Signed 04/27/2011  5:46 PM by Kathlen Brunswick, MD     Last evaluated by Dr. Ladona Ridgel in 08/2010-he will continue to follow her as appropriate.    Aortic stenosis   Last Assessment & Plan Note   09/30/2011 Office Visit Signed 09/30/2011 11:41 AM by Dyann Kief, PA    I suspect most her symptoms are coming from her aortic stenosis. She is adamant about living independently as long as she can but is tearful in the office today because of her recent symptoms. I have asked her to take her  Lopressor earlier in the morning to see if this makes a difference. She may need an adjustment in her medications if this doesn't work.    Left bundle branch block   Congestive heart failure   Last Assessment & Plan Note   04/27/2011 Office Visit Signed 04/27/2011  5:37 PM by Kathlen Brunswick, MD    CHF appears compensated at the present time.  The various manifestations of critical aortic stenosis were described to this nice woman, and she was advised to report them should they occur.    Amaurosis fugax, both eyes   Last Assessment & Plan Note   09/30/2011 Office Visit Addendum 09/30/2011 11:42 AM by Dyann Kief, PA    Resolved and without recurrence    Atrial fibrillation     Other   Chronic anticoagulation   Last Assessment & Plan Note   09/30/2011 Office Visit Signed 09/30/2011 11:42 AM by Dyann Kief, PA    Patient was off her Coumadin for 10 days because of waiting for her esophageal dilatation. She is back on her Coumadin now and it was adjusted today.    Chronic kidney disease   Last Assessment & Plan Note   04/27/2011 Office Visit Signed 04/27/2011  5:35 PM by Kathlen Brunswick, MD    With recent entirely normal creatinine and BUN, there does not appear to be an important component of chronic renal disease.    Gastroesophageal reflux disease   Chronic lymphocytic leukemia   Last Assessment & Plan Note   04/27/2011 Office Visit Signed 04/27/2011  5:36 PM by Kathlen Brunswick, MD    White count has been stable and not markedly elevated, most recently 16,200.  Dr. Mariel Sleet continues to follow this problem, which has not required much treatment.    Shortness of breath   Last Assessment & Plan Note   09/30/2011 Office Visit Signed 09/30/2011 11:40 AM by Dyann Kief, PA    Patient has had 2 episodes of shortness of breath and rapid heartbeat when she first gets up in the morning. I asked her to take her Lopressor with breakfast or when these episodes occur. If they continue we  will place an event recorder on her.        Imaging: Dg Esophagus  09/15/2011  *RADIOLOGY REPORT*  Clinical Data: Distal thoracic esophageal dysphagia for liquids and solids for 3 months  ESOPHAGUS/BARIUM SWALLOW/TABLET STUDY:  Technique:  Single contrast, air contrast, and tablet imaging of the esophagus were performed.  Fluoroscopy time:  3.7 minutes  Comparison: None  Findings: Well preserved esophageal motility for age. Smooth esophageal mucosa on air-contrast images without irregularity or ulceration. Small diverticulum from posterior wall of  mid thoracic esophagus below the carina. No persistent intraluminal filling defects. Focal narrowing of the esophagus at the gastroesophageal junction, obstructing a 12.5 mm diameter barium tablet. Targeted rapid sequence imaging of the cervical esophagus hypopharynx shows no laryngeal penetration or aspiration. However the patient did experience an episode of coughing when swallowing water with the 12.5 mm diameter barium tablet. Left subclavian transvenous pacemaker leads project over right atrium and right ventricle. No esophageal reflux seen during exam.  IMPRESSION: Stricture of the distal esophagus at the gastroesophageal junction without identification of mass or wall irregularity, suggesting a benign stricture.  Original Report Authenticated By: Lollie Marrow, M.D.   US Carotid Duplex Bilateral  09/14/2011  *RADIOLOGY REPORT*  Clinical Data: Visual disturbance, TIA symptoms  BILATERAL CAROTID DUPLEX ULTRASOUND  Technique: Gray scale imaging, color Doppler and duplex ultrasound was performed of bilateral carotid and vertebral arteries in the neck.  Comparison:  None.  Criteria:  Quantification of carotid stenosis is based on velocity parameters that correlate the residual internal carotid diameter with NASCET-based stenosis levels, using the diameter of the distal internal carotid lumen as the denominator for stenosis measurement.  The following velocity  measurements were obtained:                   PEAK SYSTOLIC/END DIASTOLIC RIGHT ICA:                        92/15cm/sec CCA:                        91/11cm/sec SYSTOLIC ICA/CCA RATIO:     1.01 DIASTOLIC ICA/CCA RATIO:    1.32 ECA:                        74cm/sec  LEFT ICA:                        111/24cm/sec CCA:                        109/7cm/sec SYSTOLIC ICA/CCA RATIO:     1.02 DIASTOLIC ICA/CCA RATIO:    3.35 ECA:                        87cm/sec  Findings:  RIGHT CAROTID ARTERY: Scattered minor atherosclerotic changes.  No hemodynamically significant right ICA stenosis, velocity elevation, or turbulent flow.  RIGHT VERTEBRAL ARTERY:  Antegrade  LEFT CAROTID ARTERY: Minor atherosclerotic changes and intimal thickening.  No hemodynamically significant left ICA stenosis, velocity elevation, or turbulent flow.  LEFT VERTEBRAL ARTERY:  Antegrade  IMPRESSION: Minor atherosclerotic changes.  No hemodynamically significant ICA stenosis on either side  Original Report Authenticated By: Judie Petit. Ruel Favors, M.D.     [facility] Calculation: Score not calculated

## 2011-10-15 ENCOUNTER — Ambulatory Visit (INDEPENDENT_AMBULATORY_CARE_PROVIDER_SITE_OTHER): Payer: Medicare Other | Admitting: Internal Medicine

## 2011-10-15 ENCOUNTER — Encounter: Payer: Self-pay | Admitting: Internal Medicine

## 2011-10-15 VITALS — BP 143/61 | HR 69 | Ht 60.0 in | Wt 160.0 lb

## 2011-10-15 DIAGNOSIS — Z950 Presence of cardiac pacemaker: Secondary | ICD-10-CM | POA: Insufficient documentation

## 2011-10-15 DIAGNOSIS — I4891 Unspecified atrial fibrillation: Secondary | ICD-10-CM

## 2011-10-15 DIAGNOSIS — I509 Heart failure, unspecified: Secondary | ICD-10-CM

## 2011-10-15 LAB — PACEMAKER DEVICE OBSERVATION
AL AMPLITUDE: 1.4 mv
AL IMPEDENCE PM: 366 Ohm
BATTERY VOLTAGE: 2.75 V
RV LEAD AMPLITUDE: 22.4 mv
VENTRICULAR PACING PM: 28

## 2011-10-15 NOTE — Progress Notes (Signed)
HPI [name] returns today for followup. She is a very pleasant 78 year old woman with a history of symptomatic bradycardia, status post permanent pacemaker insertion. She describes an episode approximately one month ago she was standing up in the morning at her computer and became dizzy and lightheaded. She felt as if she might state. She had blindness transiently. She was taken to the emergency room and admitted. She has no permanent neurologic deficits. She did have palpitations with the episode. No arrhythmias have been documented. She now notes that she has some good days and some not submitted days. She denies recurrent syncope, chest pain, or shortness of breath. Allergies  Allergen Reactions  . Detrol (Tolterodine Tartrate) Other (See Comments)    Patient states: "I was told to never take it again, I do not recall the reaction"  . Sanctura (Trospium Chloride) Nausea Only and Other (See Comments)    Intense lower abd pain  . Sulfa Antibiotics Other (See Comments)    Extreme dizziness  . Sulfonamide Derivatives Other (See Comments)    REACTION: GI distress     Current Outpatient Prescriptions  Medication Sig Dispense Refill  . acetaminophen (TYLENOL) 500 MG tablet Take 1,000 mg by mouth 4 (four) times daily.        . calcium-vitamin D (OSCAL WITH D) 500-200 MG-UNIT per tablet Take 1 tablet by mouth daily.        . furosemide (LASIX) 20 MG tablet Take 20 mg by mouth daily.      [name] lisinopril (PRINIVIL,ZESTRIL) 20 MG tablet Take 1 tablet (20 mg total) by mouth daily.  90 tablet  3  . metoprolol tartrate (LOPRESSOR) 25 MG tablet Take 1 tablet (25 mg total) by mouth 2 (two) times daily.  180 tablet  3  . Multiple Vitamins-Minerals (MULTIVITAMIN WITH MINERALS) tablet Take 1 tablet by mouth daily.        . nitroGLYCERIN (NITROSTAT) 0.4 MG SL tablet Place 1 tablet (0.4 mg total) under the tongue every 5 (five) minutes as needed for chest pain.  25 tablet  11  . omeprazole (PRILOSEC) 20 MG  capsule Take 20 mg by mouth daily.      . pravastatin (PRAVACHOL) 40 MG tablet Take 40 mg by mouth daily.        . verapamil (CALAN-SR) 180 MG CR tablet Take 1 tablet (180 mg total) by mouth at bedtime.  90 tablet  3  . warfarin (COUMADIN) 2.5 MG tablet Take 2.5 mg by mouth as directed.       No current facility-administered medications for this visit.   Facility-Administered Medications Ordered in Other Visits  Medication Dose Route Frequency Provider Last Rate Last Dose  . epoetin alfa (EPOGEN,PROCRIT) injection 40,000 Units  40,000 Units Subcutaneous Once Randall An, MD         Past Medical History  Diagnosis Date  . Sick sinus syndrome 11/2006    paroxysmal atrial fibrillation; dual-chamber pacemaker in 11/2006  . Aortic stenosis     mild  . Left bundle branch block   . Congestive heart failure     Congestive heart failure with normal ejection fraction  . Arteriosclerotic cardiovascular disease (ASCVD)     coronary angio in 10/09:50% left anterior descending; 80% PDA; medical therapy advisedOrthostatic decrease in blood pressure  . Epistaxis     mild with negative ENT evaluation  . Chronic kidney disease      Creatinine of 1.7 in 8/08, 1.4 in 10/08 and 1.5 in 11/09  .  Dizziness     chronic  . Osteoarthritis   . Irritable bowel syndrome   . Urinary frequency     with incontinence  . Gastroesophageal reflux disease     Esophageal dilatation for stricture in 09/2011  . Rosacea   . Asthma     Childhood asthma  . Chronic lymphocytic leukemia     Stage I with anemia H./H. of 9/27 in 11/09 with normal MCV; iron deficiency in bone marrow  . Orthostatic hypotension   . Chronic anticoagulation   . Diverticulosis   . Adenomatous polyp October 2010    (Proximal Small bowel) With high grade dysplasia in polypoid lesion straddling D1/D2    ROS:   All systems reviewed and negative except as noted in the HPI.   Past Surgical History  Procedure Date  . Cholecystectomy     . Cervical spine surgery   . Lumbar disc surgery   . Bladder suspension   . Abdominal hysterectomy 1972  . A-v cardiac pacemaker insertion 11/2006    [employer]  . Cataract extraction     Bilateral  . Esophagogastroduodenoscopy 01/09/09    Rourk -noncritical Schatzki's ring, small hiatal hernia, fundal gland polyps, bile-stained gastric mucosa, polypoid lesion straddling D1 and D2 1-2 cm, localized lymphangitic-appearing mucosa at D2 biopsied (adenomatous polyp with high grade glandular dysplasia     Family History  Problem Relation Age of Onset  . Heart failure Father   . Cancer Father   . Diabetes Mother      History   Social History  . Marital Status: Widowed    Spouse Name: N/A    Number of Children: N/A  . Years of Education: N/A   Occupational History  . retired    Social History Main Topics  . Smoking status: Never Smoker   . Smokeless tobacco: Never Used  . Alcohol Use: No  . Drug Use: No  . Sexually Active: No   Other Topics Concern  . Not on file   Social History Narrative  . No narrative on file     BP 143/61  Pulse 69  Ht 5' (1.524 m)  Wt 160 lb (72.576 kg)  BMI 31.25 kg/m2  Physical Exam:  Well appearing elderly woman who looks younger than her stated age and in NAD HEENT: Unremarkable Neck:  No JVD, no thyromegally Lungs:  Clear with no wheezes, rales, or rhonchi. Well-healed pacemaker incision. HEART:  Regular rate rhythm, no murmurs, no rubs, no clicks Abd:  soft, positive bowel sounds, no organomegally, no rebound, no guarding Ext:  2 plus pulses, no edema, no cyanosis, no clubbing Skin:  No rashes no nodules Neuro:  CN II through XII intact, motor grossly intact  DEVICE  Normal device function.  See PaceArt for details.   Assess/Plan:

## 2011-10-15 NOTE — Assessment & Plan Note (Signed)
Her device is working normally. We'll plan to recheck in several months. Today we reprogrammed her device from DD IR to DDDR.

## 2011-10-15 NOTE — Patient Instructions (Addendum)
Your physician wants you to follow-up in: 6 months device check with Gunnar Fusi and 1 year with Dr Ladona Ridgel.   You will receive a reminder letter in the mail two months in advance. If you don't receive a letter, please call our office to schedule the follow-up appointment.  Your physician recommends that you continue on your current medications as directed. Please refer to the Current Medication list given to you today.

## 2011-10-15 NOTE — Assessment & Plan Note (Signed)
Her congestive heart failure symptoms are class II and appear to be well compensated. I've encouraged her to continue her medical therapy and maintain a low-sodium diet.

## 2011-10-17 NOTE — Assessment & Plan Note (Signed)
Creatinine has normalized when compared to mild to moderate elevations 5 years ago.  No significant renal problems at present.

## 2011-10-19 ENCOUNTER — Encounter: Payer: Self-pay | Admitting: Internal Medicine

## 2011-10-26 ENCOUNTER — Ambulatory Visit (INDEPENDENT_AMBULATORY_CARE_PROVIDER_SITE_OTHER): Payer: Medicare Other | Admitting: *Deleted

## 2011-10-26 DIAGNOSIS — Z7901 Long term (current) use of anticoagulants: Secondary | ICD-10-CM

## 2011-11-04 ENCOUNTER — Other Ambulatory Visit: Payer: Self-pay | Admitting: Cardiology

## 2011-11-04 LAB — CBC
HCT: 31.5 % — ABNORMAL LOW (ref 36.0–46.0)
Hemoglobin: 10.5 g/dL — ABNORMAL LOW (ref 12.0–15.0)
MCV: 91 fL (ref 78.0–100.0)
RDW: 13.6 % (ref 11.5–15.5)
WBC: 20.1 10*3/uL — ABNORMAL HIGH (ref 4.0–10.5)

## 2011-11-05 ENCOUNTER — Encounter: Payer: Self-pay | Admitting: Cardiology

## 2011-11-05 ENCOUNTER — Other Ambulatory Visit: Payer: Self-pay | Admitting: *Deleted

## 2011-11-05 LAB — BASIC METABOLIC PANEL
BUN: 35 mg/dL — ABNORMAL HIGH (ref 6–23)
Chloride: 108 mEq/L (ref 96–112)
Creat: 1.27 mg/dL — ABNORMAL HIGH (ref 0.50–1.10)
Glucose, Bld: 99 mg/dL (ref 70–99)

## 2011-11-05 MED ORDER — FUROSEMIDE 20 MG PO TABS: 20.0000 mg | ORAL_TABLET | Freq: Every day | ORAL | Status: DC

## 2011-11-09 ENCOUNTER — Encounter: Payer: Self-pay | Admitting: *Deleted

## 2011-11-12 ENCOUNTER — Ambulatory Visit (INDEPENDENT_AMBULATORY_CARE_PROVIDER_SITE_OTHER): Payer: Medicare Other | Admitting: *Deleted

## 2011-11-12 ENCOUNTER — Other Ambulatory Visit: Payer: Self-pay | Admitting: *Deleted

## 2011-11-12 DIAGNOSIS — Z7901 Long term (current) use of anticoagulants: Secondary | ICD-10-CM

## 2011-11-12 LAB — POCT INR: INR: 1.4

## 2011-11-12 MED ORDER — LISINOPRIL 20 MG PO TABS: 20.0000 mg | ORAL_TABLET | Freq: Two times a day (BID) | ORAL | Status: DC

## 2011-11-12 MED ORDER — LISINOPRIL 20 MG PO TABS: 20.0000 mg | ORAL_TABLET | Freq: Every day | ORAL | Status: DC

## 2011-11-19 ENCOUNTER — Ambulatory Visit (INDEPENDENT_AMBULATORY_CARE_PROVIDER_SITE_OTHER): Payer: Medicare Other | Admitting: Cardiology

## 2011-11-19 ENCOUNTER — Encounter: Payer: Self-pay | Admitting: Cardiology

## 2011-11-19 VITALS — BP 138/58 | HR 82 | Ht 62.0 in | Wt 158.0 lb

## 2011-11-19 DIAGNOSIS — R0602 Shortness of breath: Secondary | ICD-10-CM

## 2011-11-19 DIAGNOSIS — Z7901 Long term (current) use of anticoagulants: Secondary | ICD-10-CM

## 2011-11-19 DIAGNOSIS — I10 Essential (primary) hypertension: Secondary | ICD-10-CM

## 2011-11-19 DIAGNOSIS — I7090 Unspecified atherosclerosis: Secondary | ICD-10-CM

## 2011-11-19 DIAGNOSIS — I495 Sick sinus syndrome: Secondary | ICD-10-CM

## 2011-11-19 DIAGNOSIS — N189 Chronic kidney disease, unspecified: Secondary | ICD-10-CM

## 2011-11-19 DIAGNOSIS — I359 Nonrheumatic aortic valve disorder, unspecified: Secondary | ICD-10-CM

## 2011-11-19 DIAGNOSIS — H53129 Transient visual loss, unspecified eye: Secondary | ICD-10-CM

## 2011-11-19 DIAGNOSIS — I509 Heart failure, unspecified: Secondary | ICD-10-CM

## 2011-11-19 DIAGNOSIS — C9110 Chronic lymphocytic leukemia of B-cell type not having achieved remission: Secondary | ICD-10-CM

## 2011-11-19 DIAGNOSIS — I350 Nonrheumatic aortic (valve) stenosis: Secondary | ICD-10-CM

## 2011-11-19 DIAGNOSIS — I2510 Atherosclerotic heart disease of native coronary artery without angina pectoris: Secondary | ICD-10-CM

## 2011-11-19 MED ORDER — LISINOPRIL 20 MG PO TABS: 20.0000 mg | ORAL_TABLET | Freq: Every day | ORAL | Status: DC

## 2011-11-19 NOTE — Progress Notes (Deleted)
Name: [name]    DOB: [birth year]  Age: 78 y.o.  MR#: [id number]       PCP:  [name], MD      Insurance: @PAYORNAME @   CC:   No chief complaint on file.   VS BP 138/58  Pulse 82  Ht 5\' 2"  (1.575 m)  Wt 158 lb (71.668 kg)  BMI 28.90 kg/m2  SpO2 95%  Weights Current Weight  11/19/11 158 lb (71.668 kg)  10/15/11 160 lb (72.576 kg)  10/14/11 168 lb (76.204 kg)    Blood Pressure  BP Readings from Last 3 Encounters:  11/19/11 138/58  10/15/11 143/61  10/14/11 160/60     Admit date:  (Not on file) Last encounter with RMR:  11/05/2011   Allergy Allergies  Allergen Reactions  . Detrol (Tolterodine Tartrate) Other (See Comments)    Patient states: "I was told to never take it again, I do not recall the reaction"  . Sanctura (Trospium Chloride) Nausea Only and Other (See Comments)    Intense lower abd pain  . Sulfa Antibiotics Other (See Comments)    Extreme dizziness  . Sulfonamide Derivatives Other (See Comments)    REACTION: GI distress    Current Outpatient Prescriptions  Medication Sig Dispense Refill  . acetaminophen (TYLENOL) 500 MG tablet Take 1,000 mg by mouth 4 (four) times daily.        . calcium-vitamin D (OSCAL WITH D) 500-200 MG-UNIT per tablet Take 1 tablet by mouth daily.        . furosemide (LASIX) 20 MG tablet Take 1 tablet (20 mg total) by mouth daily.  30 tablet  11  . lisinopril (PRINIVIL,ZESTRIL) 20 MG tablet Take 1 tablet (20 mg total) by mouth daily.  90 tablet  3  . metoprolol tartrate (LOPRESSOR) 25 MG tablet Take 1 tablet (25 mg total) by mouth 2 (two) times daily.  180 tablet  3  . Multiple Vitamins-Minerals (MULTIVITAMIN WITH MINERALS) tablet Take 1 tablet by mouth daily.        . nitroGLYCERIN (NITROSTAT) 0.4 MG SL tablet Place 1 tablet (0.4 mg total) under the tongue every 5 (five) minutes as needed for chest pain.  25 tablet  11  . omeprazole (PRILOSEC) 20 MG capsule Take 20 mg by mouth daily.      . pravastatin (PRAVACHOL) 40 MG  tablet Take 40 mg by mouth daily.        . verapamil (CALAN-SR) 180 MG CR tablet Take 1 tablet (180 mg total) by mouth at bedtime.  90 tablet  3  . warfarin (COUMADIN) 2.5 MG tablet Take 2.5 mg by mouth as directed.       No current facility-administered medications for this visit.   Facility-Administered Medications Ordered in Other Visits  Medication Dose Route Frequency Provider Last Rate Last Dose  . epoetin alfa (EPOGEN,PROCRIT) injection 40,000 Units  40,000 Units Subcutaneous Once Randall An, MD        Discontinued Meds:   There are no discontinued medications.  Patient Active Problem List  Diagnosis  . HYPERLIPIDEMIA  . HYPERTENSION  . Arteriosclerotic cardiovascular disease (ASCVD)  . Chronic anticoagulation  . Sick sinus syndrome  . Aortic stenosis  . Left bundle branch block  . Congestive heart failure  . Chronic kidney disease  . Gastroesophageal reflux disease  . Chronic lymphocytic leukemia  . Transient blindness  . Atrial fibrillation  . Shortness of breath  . Pacemaker    LABS Anti-coag visit on  11/12/2011  Component Date Value  . INR 11/12/2011 1.4   Orders Only on 11/04/2011  Component Date Value  . WBC 11/04/2011 20.1*  . RBC 11/04/2011 3.46*  . Hemoglobin 11/04/2011 10.5*  . HCT 11/04/2011 31.5*  . MCV 11/04/2011 91.0   . [facility] 11/04/2011 30.3   . MCHC 11/04/2011 33.3   . RDW 11/04/2011 13.6   . Platelets 11/04/2011 226   . Sodium 11/04/2011 141   . Potassium 11/04/2011 4.9   . Chloride 11/04/2011 108   . CO2 11/04/2011 24   . Glucose, Bld 11/04/2011 99   . BUN 11/04/2011 35*  . Creat 11/04/2011 1.27*  . Calcium 11/04/2011 9.6   . Brain Natriuretic Peptide 11/04/2011 159.8*  Anti-coag visit on 10/26/2011  Component Date Value  . INR 10/26/2011 3.2   Office Visit on 10/15/2011  Component Date Value  . DEVICE MODEL PM 10/15/2011 [id number]   . [id number] 10/15/2011 Lewayne Bunting   M.D.   . Sherlon Handing 10/15/2011 Lewayne Bunting    M.D.   . PACEART TECH NOTES PM 10/15/2011                     Value:Pacemaker check in clinic. Normal device function. Thresholds, sensing, impedances consistent with previous measurements. Device programmed to maximize longevity. 4 mode switches, ,0.1%, = coumadin.  No high ventricular rates noted. Device programmed at                          appropriate safety margins. Histogram distribution appropriate for patient activity level. Device programmed to optimize intrinsic conduction. Patient enrolled in remote follow-up/TTM's with [employer]. Plan to follow every 3 months remotely and see annually                          in office. Patient education completed.  . ATRIAL PACING PM 10/15/2011 21   . VENTRICULAR PACING PM 10/15/2011 28   . BATTERY VOLTAGE 10/15/2011 2.75   . AL IMPEDENCE PM 10/15/2011 366   . RV LEAD IMPEDENCE PM 10/15/2011 524   . AL AMPLITUDE 10/15/2011 1.40   . RV LEAD AMPLITUDE 10/15/2011 22.40   . AL THRESHOLD 10/15/2011 0.5   . RV LEAD THRESHOLD 10/15/2011 0.75   Anti-coag visit on 10/12/2011  Component Date Value  . INR 10/12/2011 3.3   Anti-coag visit on 09/30/2011  Component Date Value  . INR 09/30/2011 1.4   Admission on 09/13/2011, Discharged on 09/16/2011  Component Date Value  . WBC 09/13/2011 25.6*  . RBC 09/13/2011 3.49*  . Hemoglobin 09/13/2011 11.1*  . HCT 09/13/2011 34.4*  . MCV 09/13/2011 98.6   . [facility] 09/13/2011 31.8   . MCHC 09/13/2011 32.3   . RDW 09/13/2011 13.1   . Platelets 09/13/2011 227   . Neutrophils Relative 09/13/2011 41*  . Neutro Abs 09/13/2011 10.5*  . Lymphocytes Relative 09/13/2011 53*  . Lymphs Abs 09/13/2011 13.6*  . Monocytes Relative 09/13/2011 3   . Monocytes Absolute 09/13/2011 0.8   . Eosinophils Relative 09/13/2011 2   . Eosinophils Absolute 09/13/2011 0.6   . Basophils Relative 09/13/2011 0   . Basophils Absolute 09/13/2011 0.1   . WBC Morphology 09/13/2011 WHITE COUNT CONFIRMED ON SMEAR   . Sodium 09/13/2011 140   .  Potassium 09/13/2011 3.8   . Chloride 09/13/2011 104   . CO2 09/13/2011 26   . Glucose, Bld 09/13/2011 148*  . BUN 09/13/2011 31*  .  Creatinine, Ser 09/13/2011 1.12*  . Calcium 09/13/2011 9.6   . Total Protein 09/13/2011 6.8   . Albumin 09/13/2011 3.7   . AST 09/13/2011 21   . ALT 09/13/2011 13   . Alkaline Phosphatase 09/13/2011 99   . Total Bilirubin 09/13/2011 0.3   . GFR calc non Af Amer 09/13/2011 41*  . GFR calc Af Amer 09/13/2011 48*  . Troponin I 09/13/2011 <0.30   . Pro B Natriuretic peptid* 09/13/2011 580.6*  . Color, Urine 09/13/2011 YELLOW   . APPearance 09/13/2011 CLEAR   . Specific Gravity, Urine 09/13/2011 1.020   . pH 09/13/2011 5.5   . Glucose, UA 09/13/2011 NEGATIVE   . Hgb urine dipstick 09/13/2011 NEGATIVE   . Bilirubin Urine 09/13/2011 NEGATIVE   . Ketones, ur 09/13/2011 NEGATIVE   . Protein, ur 09/13/2011 NEGATIVE   . Urobilinogen, UA 09/13/2011 0.2   . Nitrite 09/13/2011 POSITIVE*  . Leukocytes, UA 09/13/2011 TRACE*  . aPTT 09/13/2011 44*  . Prothrombin Time 09/13/2011 29.5*  . INR 09/13/2011 2.75*  . Squamous Epithelial / LPF 09/13/2011 RARE   . WBC, UA 09/13/2011 3-6   . RBC / HPF 09/13/2011 0-2   . Bacteria, UA 09/13/2011 MANY*  . Specimen Description 09/13/2011 URINE, CLEAN CATCH   . Special Requests 09/13/2011 NONE   . Culture  Setup Time 09/13/2011 09/13/2011 20:31   . Colony Count 09/13/2011 >=100,000 COLONIES/ML   . Culture 09/13/2011 ESCHERICHIA COLI   . Report Status 09/13/2011 09/16/2011 FINAL   . Organism ID, Bacteria 09/13/2011 ESCHERICHIA COLI   . Specimen Description 09/13/2011 WOUND RIGHT NOSE   . Special Requests 09/13/2011 NONE   . Gram Stain 09/13/2011                     Value:NO WBC SEEN                         RARE SQUAMOUS EPITHELIAL CELLS PRESENT                         RARE GRAM NEGATIVE RODS  . Culture 09/13/2011                     Value:RARE METHICILLIN RESISTANT STAPHYLOCOCCUS AUREUS                         Note:  RIFAMPIN AND GENTAMICIN SHOULD NOT BE USED AS SINGLE DRUGS FOR TREATMENT OF STAPH INFECTIONS. This organism DOES NOT demonstrate inducible Clindamycin resistance in vitro.  . Report Status 09/13/2011 09/17/2011 FINAL   . Organism ID, Bacteria 09/13/2011 METHICILLIN RESISTANT STAPHYLOCOCCUS AUREUS   . Prothrombin Time 09/15/2011 27.9*  . INR 09/15/2011 2.56*  . Prothrombin Time 09/16/2011 27.4*  . INR 09/16/2011 2.50*  Anti-coag visit on 09/03/2011  Component Date Value  . INR 09/03/2011 2.9   Infusion on 08/21/2011  Component Date Value  . WBC 08/21/2011 20.3*  . RBC 08/21/2011 3.43*  . Hemoglobin 08/21/2011 10.8*  . HCT 08/21/2011 33.4*  . MCV 08/21/2011 97.4   . [facility] 08/21/2011 31.5   . MCHC 08/21/2011 32.3   . RDW 08/21/2011 13.4   . Platelets 08/21/2011 224   . Neutrophils Relative 08/21/2011 31*  . Neutro Abs 08/21/2011 6.4   . Lymphocytes Relative 08/21/2011 62*  . Lymphs Abs 08/21/2011 12.5*  . Monocytes Relative 08/21/2011 4   . Monocytes Absolute 08/21/2011 0.8   .  Eosinophils Relative 08/21/2011 3   . Eosinophils Absolute 08/21/2011 0.6   . Basophils Relative 08/21/2011 0   . Basophils Absolute 08/21/2011 0.1   . WBC Morphology 08/21/2011 ATYPICAL LYMPHOCYTES      Results for this Opt Visit:     Results for orders placed in visit on 11/12/11  POCT INR      Component Value Range   INR 1.4      EKG Orders placed in visit on 10/19/11  . EKG 12-LEAD     Prior Assessment and Plan Problem List as of 11/19/2011            Cardiology Problems   HYPERLIPIDEMIA   Last Assessment & Plan Note   04/27/2011 Office Visit Signed 04/27/2011  5:41 PM by Kathlen Brunswick, MD    Low total and LDL cholesterol with low HDL and mildly elevated triglycerides.   Lipid profile is suboptimal, but additional pharmacologic therapy is not necessary.  Current treatment with low-dose simvastatin appears adequately effective; however, concomitant administration with verapamil is not  recommended.  Pravastatin will be substituted.    HYPERTENSION   Last Assessment & Plan Note   10/14/2011 Office Visit Addendum 10/14/2011  4:19 PM by Kathlen Brunswick, MD    Patient returns a list of 3 home blood pressure determinations over the past few weeks;  Systolic pressure 144 on one occasion; otherwise pressure is normal.  Systolic mildly elevated in office today.    Arteriosclerotic cardiovascular disease (ASCVD)   Last Assessment & Plan Note   04/27/2011 Office Visit Addendum 05/01/2011  6:11 PM by Kathlen Brunswick, MD    Current medical therapy is appropriate for coronary artery disease with essentially single vessel disease.  Lipid profile is pending to assess adequacy of current lipid-lowering therapy.    Sick sinus syndrome   Last Assessment & Plan Note   04/27/2011 Office Visit Signed 04/27/2011  5:46 PM by Kathlen Brunswick, MD     Last evaluated by Dr. Ladona Ridgel in 08/2010-he will continue to follow her as appropriate.    Aortic stenosis   Last Assessment & Plan Note   10/14/2011 Office Visit Addendum 10/14/2011  4:24 PM by Kathlen Brunswick, MD    Recent echocardiogram suggests no worse than moderate aortic stenosis.  Symptoms are of concern for congestive heart failure, but exam is relatively benign.  She does not appear to have critical aortic stenosis.  I would not be inclined to proceed with invasive evaluation and treatment unless it is more likely than not that symptoms are related to aortic valve disease, and that those symptoms are substantially limiting.    Left bundle branch block   Congestive heart failure   Last Assessment & Plan Note   10/15/2011 Office Visit Signed 10/15/2011 11:40 AM by Marinus Maw, MD    Her congestive heart failure symptoms are class II and appear to be well compensated. I've encouraged her to continue her medical therapy and maintain a low-sodium diet.    Atrial fibrillation     Other   Chronic anticoagulation   Last Assessment & Plan Note    10/14/2011 Office Visit Addendum 10/14/2011  4:21 PM by Kathlen Brunswick, MD    Patient continues to do well on anticoagulation with no evidence for blood loss and stable and therapeutic drug effect.  INR was down to 1.4 when warfarin was held prior to esophageal dilatation, but is 3.3 today.  We will continue to manage dosing of  her anticoagulant.    Chronic kidney disease   Last Assessment & Plan Note   10/14/2011 Office Visit Signed 10/17/2011 11:01 AM by Kathlen Brunswick, MD    Creatinine has normalized when compared to mild to moderate elevations 5 years ago.  No significant renal problems at present.    Gastroesophageal reflux disease   Chronic lymphocytic leukemia   Last Assessment & Plan Note   04/27/2011 Office Visit Signed 04/27/2011  5:36 PM by Kathlen Brunswick, MD    White count has been stable and not markedly elevated, most recently 16,200.  Dr. Mariel Sleet continues to follow this problem, which has not required much treatment.    Transient blindness   Last Assessment & Plan Note   09/30/2011 Office Visit Addendum 09/30/2011 11:42 AM by Dyann Kief, PA    Resolved and without recurrence    Shortness of breath   Last Assessment & Plan Note   09/30/2011 Office Visit Signed 09/30/2011 11:40 AM by Dyann Kief, PA    Patient has had 2 episodes of shortness of breath and rapid heartbeat when she first gets up in the morning. I asked her to take her Lopressor with breakfast or when these episodes occur. If they continue we will place an event recorder on her.    Pacemaker   Last Assessment & Plan Note   10/15/2011 Office Visit Signed 10/15/2011 11:41 AM by Marinus Maw, MD    Her device is working normally. We'll plan to recheck in several months. Today we reprogrammed her device from DD IR to DDDR.        Imaging: No results found.   FRS Calculation: Score not calculated

## 2011-11-19 NOTE — Progress Notes (Signed)
Patient ID: [name], female   DOB: January 21, 1916, 78 y.o.   MRN: [id number]  HPI: Scheduled return visit for this astounding woman who I follow for aortic stenosis.  Since her last visit, she has done quite well with stable class III dyspnea on exertion, stable weight and no apparent problems related to medication.  She has had no further visual disturbance, which was her presenting symptom when she was hospitalized a few months ago.  The options for treatment of critical aortic stenosis were discussed with her in the abstract.  She indicates that she would want any life-saving intervention, including open heart surgery, if necessary to preserve her functional status.  Prior to Admission medications   Medication Sig Start Date End Date Taking? Authorizing Provider  acetaminophen (TYLENOL) 500 MG tablet Take 1,000 mg by mouth 4 (four) times daily.     Yes Historical Provider, MD  calcium-vitamin D (OSCAL WITH D) 500-200 MG-UNIT per tablet Take 1 tablet by mouth daily.     Yes Historical Provider, MD  furosemide (LASIX) 20 MG tablet Take 1 tablet (20 mg total) by mouth daily. 11/05/11  Yes Kathlen Brunswick, MD  lisinopril (PRINIVIL,ZESTRIL) 20 MG tablet Take 1 tablet (20 mg total) by mouth daily. 11/12/11  Yes Kathlen Brunswick, MD  metoprolol tartrate (LOPRESSOR) 25 MG tablet Take 1 tablet (25 mg total) by mouth 2 (two) times daily. 09/03/11  Yes Kathlen Brunswick, MD  Multiple Vitamins-Minerals (MULTIVITAMIN WITH MINERALS) tablet Take 1 tablet by mouth daily.     Yes Historical Provider, MD  nitroGLYCERIN (NITROSTAT) 0.4 MG SL tablet Place 1 tablet (0.4 mg total) under the tongue every 5 (five) minutes as needed for chest pain. 10/14/11 10/13/12 Yes Kathlen Brunswick, MD  omeprazole (PRILOSEC) 20 MG capsule Take 20 mg by mouth daily.   Yes Historical Provider, MD  pravastatin (PRAVACHOL) 40 MG tablet Take 40 mg by mouth daily.     Yes Historical Provider, MD  verapamil (CALAN-SR) 180 MG CR tablet Take 1  tablet (180 mg total) by mouth at bedtime. 09/03/11  Yes Kathlen Brunswick, MD  warfarin (COUMADIN) 2.5 MG tablet Take 2.5 mg by mouth as directed.   Yes Historical Provider, MD   Allergies  Allergen Reactions  . Detrol (Tolterodine Tartrate) Other (See Comments)    Patient states: "I was told to never take it again, I do not recall the reaction"  . Sanctura (Trospium Chloride) Nausea Only and Other (See Comments)    Intense lower abd pain  . Sulfa Antibiotics Other (See Comments)    Extreme dizziness  . Sulfonamide Derivatives Other (See Comments)    REACTION: GI distress     Past medical history, social history, and family history reviewed and updated.  ROS: Denies orthopnea, PND, palpitations, lightheadedness, chest pain or syncope.  She does note mild intermittent pedal edema.  All other systems reviewed and are negative.  PHYSICAL EXAM: BP 138/58  Pulse 82  Ht 5\' 2"  (1.575 m)  Wt 71.668 kg (158 lb)  BMI 28.90 kg/m2  SpO2 95%  General-Well developed; no acute distress Body habitus-Mildly to moderately overweight Neck-No JVD; Transmitted murmur vs bilateral bruits; normal carotid upstrokes Lungs-clear lung fields; resonant to percussion; mild to moderate kyphosis Cardiovascular- normal S1 and preserved S2; grade 3/6 mid-peaking harsh systolic ejection murmur at the cardiac base but heard widely across the precordium Abdomen-normal bowel sounds; soft and non-tender without masses or organomegaly Musculoskeletal-No deformities, no cyanosis or clubbing Neurologic-Normal cranial  nerves; symmetric strength and tone Skin-Warm, no significant lesions Extremities-distal pulses intact; 1/2+ ankle edema  ASSESSMENT AND PLAN:  Athol Bing, MD 11/19/2011 1:39 PM

## 2011-11-19 NOTE — Patient Instructions (Addendum)
Your physician recommends that you schedule a follow-up appointment in: 10 weeks  Your physician recommends that you return for lab work in: 5 weeks  Your physician recommends that you return for lab work in:  1 - Take 20 mg of lisinopril daily

## 2011-11-20 NOTE — Assessment & Plan Note (Signed)
Renal function remains stable and age appropriate.

## 2011-11-20 NOTE — Assessment & Plan Note (Signed)
Relatively well compensated at present with chronic class III symptoms.  BNP level is excellent.

## 2011-11-20 NOTE — Assessment & Plan Note (Addendum)
Blood pressure control has improved with systolics now typically approximately 140 mmHg.  Current therapy will be continued with consolidation of lisinopril dosage from 10 mg twice a day to 20 mg once a day.

## 2011-11-20 NOTE — Assessment & Plan Note (Addendum)
Based upon clinical assessment and echocardiography, aortic valve area is not critically compromised.  We will continue to treat medically for moderately severe valvular disease plus a likely component of diastolic dysfunction.

## 2011-11-20 NOTE — Assessment & Plan Note (Signed)
[name] continues to do well with anticoagulation.  Ongoing monitoring to exclude occult GI blood loss has been negative to date.  Mild chronic anemia is likely related to CLL.

## 2011-11-20 NOTE — Assessment & Plan Note (Signed)
Paroxysmal atrial fibrillation; dual-chamber pacemaker in 11/2006; Last evaluated by Dr. Ladona Ridgel in 10/2011-he will continue to follow her as appropriate.

## 2011-11-23 ENCOUNTER — Other Ambulatory Visit (HOSPITAL_COMMUNITY): Payer: Medicare Other

## 2011-11-26 ENCOUNTER — Ambulatory Visit (INDEPENDENT_AMBULATORY_CARE_PROVIDER_SITE_OTHER): Payer: Medicare Other | Admitting: *Deleted

## 2011-11-26 DIAGNOSIS — Z7901 Long term (current) use of anticoagulants: Secondary | ICD-10-CM

## 2011-11-30 ENCOUNTER — Encounter (HOSPITAL_COMMUNITY): Payer: Medicare Other | Attending: Oncology

## 2011-11-30 DIAGNOSIS — D638 Anemia in other chronic diseases classified elsewhere: Secondary | ICD-10-CM | POA: Insufficient documentation

## 2011-11-30 DIAGNOSIS — C9110 Chronic lymphocytic leukemia of B-cell type not having achieved remission: Secondary | ICD-10-CM | POA: Insufficient documentation

## 2011-11-30 LAB — CBC
HCT: 31.9 % — ABNORMAL LOW (ref 36.0–46.0)
Hemoglobin: 10.3 g/dL — ABNORMAL LOW (ref 12.0–15.0)
MCH: 30.8 pg (ref 26.0–34.0)
MCHC: 32.3 g/dL (ref 30.0–36.0)
MCV: 95.5 fL (ref 78.0–100.0)
RBC: 3.34 MIL/uL — ABNORMAL LOW (ref 3.87–5.11)

## 2011-11-30 LAB — DIFFERENTIAL
Basophils Relative: 0 % (ref 0–1)
Eosinophils Absolute: 0.4 10*3/uL (ref 0.0–0.7)
Eosinophils Relative: 2 % (ref 0–5)
Lymphs Abs: 15.8 10*3/uL — ABNORMAL HIGH (ref 0.7–4.0)
Monocytes Absolute: 0.8 10*3/uL (ref 0.1–1.0)
Monocytes Relative: 3 % (ref 3–12)
Neutrophils Relative %: 29 % — ABNORMAL LOW (ref 43–77)

## 2011-11-30 NOTE — Progress Notes (Signed)
Labs drawn today for cbc/diff 

## 2011-12-01 ENCOUNTER — Encounter (HOSPITAL_COMMUNITY): Payer: Self-pay | Admitting: Oncology

## 2011-12-01 ENCOUNTER — Encounter (HOSPITAL_BASED_OUTPATIENT_CLINIC_OR_DEPARTMENT_OTHER): Payer: Medicare Other | Admitting: Oncology

## 2011-12-01 VITALS — BP 137/70 | HR 74 | Temp 98.2°F | Wt 159.5 lb

## 2011-12-01 DIAGNOSIS — N289 Disorder of kidney and ureter, unspecified: Secondary | ICD-10-CM

## 2011-12-01 DIAGNOSIS — D638 Anemia in other chronic diseases classified elsewhere: Secondary | ICD-10-CM

## 2011-12-01 DIAGNOSIS — C9110 Chronic lymphocytic leukemia of B-cell type not having achieved remission: Secondary | ICD-10-CM

## 2011-12-01 DIAGNOSIS — L989 Disorder of the skin and subcutaneous tissue, unspecified: Secondary | ICD-10-CM

## 2011-12-01 NOTE — Progress Notes (Signed)
Problem #1 CLL not in need of therapy. Problem #2 anemia of chronic disease secondary to mild renal insufficiency plus/minus problem #1 Problem #3 heart disease with aortic stenosis with a history of heart failure The patient is to be 78 years old tomorrow. She still has no symptoms referable to her CLL. She does have I think a lymph node in each axilla to approximately 3 x 4 cm however I cannot exclude completely of fat pad she has no lymphadenopathy in any other areas including inguinal supraclavicular, cervical, or  infraclavicular. She has no detectable splenomegaly her heart shows a grade 2/6 systolic murmur. I did not detect an S3 gallop. Her lungs are clear today. She is in no acute distress. She no ankle edema or arm edema today. Breast exam showed a heat rash bilaterally under each breast but no masses. She also has 3 places on the face one of the right for had one of the entrance to the ear canal in the right and rate she which are quite irritated and in need of a dermatology consultation. I do not think there cancer but I'm not sure what they are. They're slightly erythematous slightly eroded and she states that they itch. Her counts are very stable. Her white count is usually in the 15-50,000 range platelets are very stable, and her hemoglobin is above 10 g lately without the help of Procrit. She has not had any Procrit since December 2012. Therefore we will check her counts every 8 weeks and I will see her back in 4 months

## 2011-12-01 NOTE — Patient Instructions (Addendum)
[facility]  Discharge Instructions  RECOMMENDATIONS MADE BY THE CONSULTANT AND ANY TEST RESULTS WILL BE SENT TO YOUR REFERRING DOCTOR.   EXAM FINDINGS BY MD TODAY AND SIGNS AND SYMPTOMS TO REPORT TO CLINIC OR PRIMARY MD: exam and discussion by Dr. Mariel Sleet.  You do have some small lymph nodes in your axilla.  MD does not think it's anything to worry about right now.  We will check your blood work every 8 weeks.  See Dermatologist about the areas on your face.  MEDICATIONS PRESCRIBED: none   INSTRUCTIONS GIVEN AND DISCUSSED: Other :  Report any new lumps, recurring infections, night sweats, etc.  SPECIAL INSTRUCTIONS/FOLLOW-UP: Lab work Needed every 8 weeks and Return to Clinic in 4 months to see MD.   I acknowledge that I have been informed and understand all the instructions given to me and received a copy. I do not have any more questions at this time, but understand that I may call the Specialty Clinic at [facility] at [phone] during business hours should I have any further questions or need assistance in obtaining follow-up care.    __________________________________________  _____________  __________ Signature of Patient or Authorized Representative            Date                   Time    __________________________________________ Nurse's Signature

## 2011-12-17 ENCOUNTER — Ambulatory Visit (INDEPENDENT_AMBULATORY_CARE_PROVIDER_SITE_OTHER): Payer: Medicare Other | Admitting: *Deleted

## 2011-12-17 DIAGNOSIS — Z7901 Long term (current) use of anticoagulants: Secondary | ICD-10-CM

## 2011-12-25 ENCOUNTER — Other Ambulatory Visit: Payer: Self-pay | Admitting: *Deleted

## 2011-12-25 ENCOUNTER — Other Ambulatory Visit: Payer: Self-pay | Admitting: Cardiology

## 2011-12-25 DIAGNOSIS — R0602 Shortness of breath: Secondary | ICD-10-CM

## 2011-12-25 DIAGNOSIS — I10 Essential (primary) hypertension: Secondary | ICD-10-CM

## 2011-12-25 DIAGNOSIS — Z7901 Long term (current) use of anticoagulants: Secondary | ICD-10-CM

## 2011-12-25 DIAGNOSIS — C9110 Chronic lymphocytic leukemia of B-cell type not having achieved remission: Secondary | ICD-10-CM

## 2011-12-28 ENCOUNTER — Encounter: Payer: Self-pay | Admitting: Cardiology

## 2012-01-14 ENCOUNTER — Ambulatory Visit (INDEPENDENT_AMBULATORY_CARE_PROVIDER_SITE_OTHER): Payer: Medicare Other | Admitting: *Deleted

## 2012-01-14 DIAGNOSIS — Z7901 Long term (current) use of anticoagulants: Secondary | ICD-10-CM

## 2012-01-26 ENCOUNTER — Encounter (HOSPITAL_COMMUNITY): Payer: Medicare Other | Attending: Oncology

## 2012-01-26 DIAGNOSIS — C9110 Chronic lymphocytic leukemia of B-cell type not having achieved remission: Secondary | ICD-10-CM | POA: Insufficient documentation

## 2012-01-26 LAB — CBC WITH DIFFERENTIAL/PLATELET
Basophils Absolute: 0.1 10*3/uL (ref 0.0–0.1)
Eosinophils Relative: 2 % (ref 0–5)
HCT: 33.9 % — ABNORMAL LOW (ref 36.0–46.0)
Lymphocytes Relative: 68 % — ABNORMAL HIGH (ref 12–46)
Lymphs Abs: 16.1 10*3/uL — ABNORMAL HIGH (ref 0.7–4.0)
MCH: 31 pg (ref 26.0–34.0)
MCV: 97.4 fL (ref 78.0–100.0)
Monocytes Absolute: 0.7 10*3/uL (ref 0.1–1.0)
RDW: 13.9 % (ref 11.5–15.5)
WBC: 23.5 10*3/uL — ABNORMAL HIGH (ref 4.0–10.5)

## 2012-01-26 NOTE — Progress Notes (Signed)
Labs drawn today for cbc/diff 

## 2012-01-28 ENCOUNTER — Ambulatory Visit (INDEPENDENT_AMBULATORY_CARE_PROVIDER_SITE_OTHER): Payer: Medicare Other | Admitting: Cardiology

## 2012-01-28 ENCOUNTER — Ambulatory Visit (INDEPENDENT_AMBULATORY_CARE_PROVIDER_SITE_OTHER): Payer: Medicare Other | Admitting: *Deleted

## 2012-01-28 ENCOUNTER — Encounter: Payer: Self-pay | Admitting: Cardiology

## 2012-01-28 VITALS — BP 122/60 | HR 76 | Ht 60.0 in | Wt 157.0 lb

## 2012-01-28 DIAGNOSIS — I7090 Unspecified atherosclerosis: Secondary | ICD-10-CM

## 2012-01-28 DIAGNOSIS — C9110 Chronic lymphocytic leukemia of B-cell type not having achieved remission: Secondary | ICD-10-CM

## 2012-01-28 DIAGNOSIS — I350 Nonrheumatic aortic (valve) stenosis: Secondary | ICD-10-CM

## 2012-01-28 DIAGNOSIS — I10 Essential (primary) hypertension: Secondary | ICD-10-CM

## 2012-01-28 DIAGNOSIS — I509 Heart failure, unspecified: Secondary | ICD-10-CM

## 2012-01-28 DIAGNOSIS — Z7901 Long term (current) use of anticoagulants: Secondary | ICD-10-CM

## 2012-01-28 DIAGNOSIS — I2510 Atherosclerotic heart disease of native coronary artery without angina pectoris: Secondary | ICD-10-CM

## 2012-01-28 DIAGNOSIS — E785 Hyperlipidemia, unspecified: Secondary | ICD-10-CM

## 2012-01-28 DIAGNOSIS — I4891 Unspecified atrial fibrillation: Secondary | ICD-10-CM

## 2012-01-28 DIAGNOSIS — I359 Nonrheumatic aortic valve disorder, unspecified: Secondary | ICD-10-CM

## 2012-01-28 NOTE — Assessment & Plan Note (Addendum)
Total and LDL cholesterol were extremely low in the profile performed 08/2009, which is the most recent test available to me.  It is doubtful there has been a significant increase, and the benefit pharmacologic treatment at this advanced age is somewhat uncertain.  Current therapy will be continued without further testing.

## 2012-01-28 NOTE — Assessment & Plan Note (Signed)
EKG shows sinus rhythm at this visit.  No symptoms attributable to atrial fibrillation.  Current therapy will continue.

## 2012-01-28 NOTE — Assessment & Plan Note (Signed)
Moderate aortic stenosis, which is not clearly the cause of her current symptoms.  No active congestive heart failure in the absence of diuretic therapy.  No further testing or intervention is appropriate at present.

## 2012-01-28 NOTE — Assessment & Plan Note (Signed)
Blood pressure control has been excellent with current antihypertensive medications.

## 2012-01-28 NOTE — Assessment & Plan Note (Addendum)
Current symptoms could be related to progression of coronary disease, but patient is doing well with medical therapy, which will be continued.

## 2012-01-28 NOTE — Progress Notes (Deleted)
Name: [name]    DOB: [birth year]  Age: 78 y.o.  MR#: [id number]       PCP:  [name], MD      Insurance: @PAYORNAME @   CC:   No chief complaint on file.  MEDICATION LIST  VS BP 122/60  Pulse 76  Ht 5' (1.524 m)  Wt 157 lb (71.215 kg)  BMI 30.66 kg/m2  Weights Current Weight  01/28/12 157 lb (71.215 kg)  12/01/11 159 lb 8 oz (72.349 kg)  11/19/11 158 lb (71.668 kg)    Blood Pressure  BP Readings from Last 3 Encounters:  01/28/12 122/60  12/01/11 137/70  11/19/11 138/58     Admit date:  (Not on file) Last encounter with RMR:  12/28/2011   Allergy Allergies  Allergen Reactions  . Detrol (Tolterodine Tartrate) Other (See Comments)    Patient states: "I was told to never take it again, I do not recall the reaction"  . Sanctura (Trospium Chloride) Nausea Only and Other (See Comments)    Intense lower abd pain  . Sulfa Antibiotics Other (See Comments)    Extreme dizziness  . Sulfonamide Derivatives Other (See Comments)    REACTION: GI distress    Current Outpatient Prescriptions  Medication Sig Dispense Refill  . acetaminophen (TYLENOL) 500 MG tablet Take 1,000 mg by mouth 4 (four) times daily.        . calcium-vitamin D (OSCAL WITH D) 500-200 MG-UNIT per tablet Take 1 tablet by mouth daily.        [name] lisinopril (PRINIVIL,ZESTRIL) 20 MG tablet Take 1 tablet (20 mg total) by mouth daily.  90 tablet  3  . metoprolol tartrate (LOPRESSOR) 25 MG tablet Take 1 tablet (25 mg total) by mouth 2 (two) times daily.  180 tablet  3  . Multiple Vitamins-Minerals (MULTIVITAMIN WITH MINERALS) tablet Take 1 tablet by mouth daily.        . nitroGLYCERIN (NITROSTAT) 0.4 MG SL tablet Place 1 tablet (0.4 mg total) under the tongue every 5 (five) minutes as needed for chest pain.  25 tablet  11  . pravastatin (PRAVACHOL) 40 MG tablet Take 40 mg by mouth daily.        . verapamil (CALAN-SR) 180 MG CR tablet Take 1 tablet (180 mg total) by mouth at bedtime.  90 tablet  3  . warfarin  (COUMADIN) 2.5 MG tablet Take 2.5 mg by mouth as directed.       No current facility-administered medications for this visit.   Facility-Administered Medications Ordered in Other Visits  Medication Dose Route Frequency Provider Last Rate Last Dose  . epoetin alfa (EPOGEN,PROCRIT) injection 40,000 Units  40,000 Units Subcutaneous Once Randall An, MD        Discontinued Meds:    Medications Discontinued During This Encounter  Medication Reason  . furosemide (LASIX) 20 MG tablet Error    Patient Active Problem List  Diagnosis  . HYPERLIPIDEMIA  . HYPERTENSION  . Arteriosclerotic cardiovascular disease (ASCVD)  . Chronic anticoagulation  . Sick sinus syndrome  . Aortic stenosis  . Left bundle branch block  . Congestive heart failure  . Chronic kidney disease  . Gastroesophageal reflux disease  . Chronic lymphocytic leukemia  . Transient blindness  . Atrial fibrillation  . Pacemaker    LABS Anti-coag visit on 01/28/2012  Component Date Value  . INR 01/28/2012 2.1   Infusion on 01/26/2012  Component Date Value  . WBC 01/26/2012 23.5*  . RBC 01/26/2012 3.48*  .  Hemoglobin 01/26/2012 10.8*  . HCT 01/26/2012 33.9*  . MCV 01/26/2012 97.4   . [facility] 01/26/2012 31.0   . MCHC 01/26/2012 31.9   . RDW 01/26/2012 13.9   . Platelets 01/26/2012 242   . Neutrophils Relative 01/26/2012 26*  . Neutro Abs 01/26/2012 6.2   . Lymphocytes Relative 01/26/2012 68*  . Lymphs Abs 01/26/2012 16.1*  . Monocytes Relative 01/26/2012 3   . Monocytes Absolute 01/26/2012 0.7   . Eosinophils Relative 01/26/2012 2   . Eosinophils Absolute 01/26/2012 0.5   . Basophils Relative 01/26/2012 0   . Basophils Absolute 01/26/2012 0.1   . WBC Morphology 01/26/2012 ABSOLUTE LYMPHOCYTOSIS   Anti-coag visit on 01/14/2012  Component Date Value  . INR 01/14/2012 1.5   Orders Only on 12/25/2011  Component Date Value  . Brain Natriuretic Peptide 12/25/2011 304.6*  Anti-coag visit on 12/17/2011    Component Date Value  . INR 12/17/2011 2.2   Infusion on 11/30/2011  Component Date Value  . WBC 11/30/2011 24.2*  . RBC 11/30/2011 3.34*  . Hemoglobin 11/30/2011 10.3*  . HCT 11/30/2011 31.9*  . MCV 11/30/2011 95.5   . [facility] 11/30/2011 30.8   . MCHC 11/30/2011 32.3   . RDW 11/30/2011 13.5   . Platelets 11/30/2011 195   . Neutrophils Relative 11/30/2011 29*  . Neutro Abs 11/30/2011 7.1   . Lymphocytes Relative 11/30/2011 65*  . Lymphs Abs 11/30/2011 15.8*  . Monocytes Relative 11/30/2011 3   . Monocytes Absolute 11/30/2011 0.8   . Eosinophils Relative 11/30/2011 2   . Eosinophils Absolute 11/30/2011 0.4   . Basophils Relative 11/30/2011 0   . Basophils Absolute 11/30/2011 0.1   . WBC Morphology 11/30/2011 ABSOLUTE LYMPHOCYTOSIS   Anti-coag visit on 11/26/2011  Component Date Value  . INR 11/26/2011 2.9   Anti-coag visit on 11/12/2011  Component Date Value  . INR 11/12/2011 1.4   Orders Only on 11/04/2011  Component Date Value  . WBC 11/04/2011 20.1*  . RBC 11/04/2011 3.46*  . Hemoglobin 11/04/2011 10.5*  . HCT 11/04/2011 31.5*  . MCV 11/04/2011 91.0   . [facility] 11/04/2011 30.3   . MCHC 11/04/2011 33.3   . RDW 11/04/2011 13.6   . Platelets 11/04/2011 226   . Sodium 11/04/2011 141   . Potassium 11/04/2011 4.9   . Chloride 11/04/2011 108   . CO2 11/04/2011 24   . Glucose, Bld 11/04/2011 99   . BUN 11/04/2011 35*  . Creat 11/04/2011 1.27*  . Calcium 11/04/2011 9.6   . Brain Natriuretic Peptide 11/04/2011 159.8*     Results for this Opt Visit:     Results for orders placed in visit on 01/26/12  CBC WITH DIFFERENTIAL      Component Value Range   WBC 23.5 (*) 4.0 - 10.5 K/uL   RBC 3.48 (*) 3.87 - 5.11 MIL/uL   Hemoglobin 10.8 (*) 12.0 - 15.0 g/dL   HCT 16.1 (*) 09.6 - 04.5 %   MCV 97.4  78.0 - 100.0 fL   MCH 31.0  26.0 - 34.0 pg   MCHC 31.9  30.0 - 36.0 g/dL   RDW 40.9  81.1 - 91.4 %   Platelets 242  150 - 400 K/uL   Neutrophils Relative 26 (*) 43 - 77 %    Neutro Abs 6.2  1.7 - 7.7 K/uL   Lymphocytes Relative 68 (*) 12 - 46 %   Lymphs Abs 16.1 (*) 0.7 - 4.0 K/uL   Monocytes Relative 3  3 -  12 %   Monocytes Absolute 0.7  0.1 - 1.0 K/uL   Eosinophils Relative 2  0 - 5 %   Eosinophils Absolute 0.5  0.0 - 0.7 K/uL   Basophils Relative 0  0 - 1 %   Basophils Absolute 0.1  0.0 - 0.1 K/uL   WBC Morphology ABSOLUTE LYMPHOCYTOSIS      EKG Orders placed in visit on 01/28/12  . EKG 12-LEAD     Prior Assessment and Plan Problem List as of 01/28/2012            Cardiology Problems   HYPERLIPIDEMIA   Last Assessment & Plan Note   04/27/2011 Office Visit Signed 04/27/2011  5:41 PM by Kathlen Brunswick, MD    Low total and LDL cholesterol with low HDL and mildly elevated triglycerides.   Lipid profile is suboptimal, but additional pharmacologic therapy is not necessary.  Current treatment with low-dose simvastatin appears adequately effective; however, concomitant administration with verapamil is not recommended.  Pravastatin will be substituted.    HYPERTENSION   Last Assessment & Plan Note   11/19/2011 Office Visit Addendum 11/22/2011  2:00 PM by Kathlen Brunswick, MD    Blood pressure control has improved with systolics now typically approximately 140 mmHg.  Current therapy will be continued with consolidation of lisinopril dosage from 10 mg twice a day to 20 mg once a day.    Arteriosclerotic cardiovascular disease (ASCVD)   Last Assessment & Plan Note   04/27/2011 Office Visit Addendum 05/01/2011  6:11 PM by Kathlen Brunswick, MD    Current medical therapy is appropriate for coronary artery disease with essentially single vessel disease.  Lipid profile is pending to assess adequacy of current lipid-lowering therapy.    Sick sinus syndrome   Last Assessment & Plan Note   11/19/2011 Office Visit Signed 11/20/2011  8:59 AM by Kathlen Brunswick, MD    Paroxysmal atrial fibrillation; dual-chamber pacemaker in 11/2006; Last evaluated by Dr. Ladona Ridgel in  10/2011-he will continue to follow her as appropriate.    Aortic stenosis   Last Assessment & Plan Note   11/19/2011 Office Visit Addendum 11/22/2011  1:59 PM by Kathlen Brunswick, MD    Based upon clinical assessment and echocardiography, aortic valve area is not critically compromised.  We will continue to treat medically for moderately severe valvular disease plus a likely component of diastolic dysfunction.    Left bundle branch block   Congestive heart failure   Last Assessment & Plan Note   11/19/2011 Office Visit Signed 11/20/2011  8:55 AM by Kathlen Brunswick, MD    Relatively well compensated at present with chronic class III symptoms.  BNP level is excellent.    Atrial fibrillation     Other   Chronic anticoagulation   Last Assessment & Plan Note   11/19/2011 Office Visit Signed 11/20/2011  8:51 AM by Kathlen Brunswick, MD    Ms. Minasyan continues to do well with anticoagulation.  Ongoing monitoring to exclude occult GI blood loss has been negative to date.  Mild chronic anemia is likely related to CLL.    Chronic kidney disease   Last Assessment & Plan Note   11/19/2011 Office Visit Signed 11/20/2011  8:54 AM by Kathlen Brunswick, MD    Renal function remains stable and age appropriate.      Gastroesophageal reflux disease   Chronic lymphocytic leukemia   Last Assessment & Plan Note   04/27/2011 Office Visit Signed 04/27/2011  5:36 PM by Kathlen Brunswick, MD    White count has been stable and not markedly elevated, most recently 16,200.  Dr. Mariel Sleet continues to follow this problem, which has not required much treatment.    Transient blindness   Last Assessment & Plan Note   09/30/2011 Office Visit Addendum 09/30/2011 11:42 AM by Dyann Kief, PA    Resolved and without recurrence    Pacemaker   Last Assessment & Plan Note   10/15/2011 Office Visit Signed 10/15/2011 11:41 AM by Marinus Maw, MD    Her device is working normally. We'll plan to recheck in several months. Today we  reprogrammed her device from DD IR to DDDR.        Imaging: No results found.   FRS Calculation: Score not calculated

## 2012-01-28 NOTE — Assessment & Plan Note (Signed)
Compensated at present without diuretic therapy.  No change in current treatment is necessary.

## 2012-01-28 NOTE — Patient Instructions (Addendum)
Your physician recommends that you schedule a follow-up appointment in: 4 MONTHS. 

## 2012-01-28 NOTE — Progress Notes (Signed)
Patient ID: [name], female   DOB: [birth year], 78 y.o.   MRN: [id number]  HPI: Scheduled return visit for this lovely and astounding active 78 year old woman with multiple medical problems including hypertension, coronary disease and aortic stenosis.  Since her last visit, she has been stable with class 2-3 dyspnea on exertion.  She denies chest discomfort or syncope.  Prior to Admission medications   Medication Sig Start Date End Date Taking? Authorizing Provider  acetaminophen (TYLENOL) 500 MG tablet Take 1,000 mg by mouth 4 (four) times daily.     Yes Historical Provider, MD  calcium-vitamin D (OSCAL WITH D) 500-200 MG-UNIT per tablet Take 1 tablet by mouth daily.     Yes Historical Provider, MD  lisinopril (PRINIVIL,ZESTRIL) 20 MG tablet Take 1 tablet (20 mg total) by mouth daily. 11/19/11  Yes Kathlen Brunswick, MD  metoprolol tartrate (LOPRESSOR) 25 MG tablet Take 1 tablet (25 mg total) by mouth 2 (two) times daily. 09/03/11  Yes Kathlen Brunswick, MD  Multiple Vitamins-Minerals (MULTIVITAMIN WITH MINERALS) tablet Take 1 tablet by mouth daily.     Yes Historical Provider, MD  nitroGLYCERIN (NITROSTAT) 0.4 MG SL tablet Place 1 tablet (0.4 mg total) under the tongue every 5 (five) minutes as needed for chest pain. 10/14/11 10/13/12 Yes Kathlen Brunswick, MD  pravastatin (PRAVACHOL) 40 MG tablet Take 40 mg by mouth daily.     Yes Historical Provider, MD  verapamil (CALAN-SR) 180 MG CR tablet Take 1 tablet (180 mg total) by mouth at bedtime. 09/03/11  Yes Kathlen Brunswick, MD  warfarin (COUMADIN) 2.5 MG tablet Take 2.5 mg by mouth as directed.   Yes Historical Provider, MD   Allergies  Allergen Reactions  . Detrol (Tolterodine Tartrate) Other (See Comments)    Patient states: "I was told to never take it again, I do not recall the reaction"  . Sanctura (Trospium Chloride) Nausea Only and Other (See Comments)    Intense lower abd pain  . Sulfa Antibiotics Other (See Comments)    Extreme  dizziness  . Sulfonamide Derivatives Other (See Comments)    REACTION: GI distress     Past medical history, social history, and family history reviewed and updated.  ROS: Denies orthopnea, PND, pedal edema, weight gain or palpitations.  PHYSICAL EXAM: BP 122/60  Pulse 76  Ht 5' (1.524 m)  Wt 71.215 kg (157 lb)  BMI 30.66 kg/m2  General-Well developed; no acute distress Body habitus-Mildly overweight Neck-No JVD; Slightly delayed carotid upstrokes with transmitted murmur bilaterally Lungs-clear lung fields; resonant to percussion; mild kyphosis Cardiovascular-normal PMI; normal S1; Decreased aortic component of the second heart sound; grade 2/6 wheezing mid-peaking systolic ejection murmur Abdomen-normal bowel sounds; soft and non-tender without masses or organomegaly Musculoskeletal-No deformities, no cyanosis or clubbing Neurologic-Normal cranial nerves; symmetric strength and tone Skin-Warm, no significant lesions Extremities-distal pulses intact; Trace edema  EKG: Normal sinus rhythm; IVCD; possible previous anteroseptal MI  ASSESSMENT AND PLAN:  Kiester Bing, MD 01/28/2012 2:23 PM

## 2012-01-28 NOTE — Assessment & Plan Note (Signed)
Most recent INR performed 2 weeks ago was subtherapeutic; continued monitoring and adjustment of warfarin dosage will be undertaken in our anticoagulation clinic.

## 2012-02-25 ENCOUNTER — Ambulatory Visit (INDEPENDENT_AMBULATORY_CARE_PROVIDER_SITE_OTHER): Payer: Medicare Other | Admitting: *Deleted

## 2012-02-25 DIAGNOSIS — Z7901 Long term (current) use of anticoagulants: Secondary | ICD-10-CM

## 2012-03-22 ENCOUNTER — Encounter (HOSPITAL_COMMUNITY): Payer: Medicare Other | Attending: Oncology

## 2012-03-22 DIAGNOSIS — B372 Candidiasis of skin and nail: Secondary | ICD-10-CM | POA: Insufficient documentation

## 2012-03-22 DIAGNOSIS — J209 Acute bronchitis, unspecified: Secondary | ICD-10-CM | POA: Insufficient documentation

## 2012-03-22 DIAGNOSIS — C9110 Chronic lymphocytic leukemia of B-cell type not having achieved remission: Secondary | ICD-10-CM | POA: Insufficient documentation

## 2012-03-22 LAB — CBC WITH DIFFERENTIAL/PLATELET
Eosinophils Relative: 2 % (ref 0–5)
HCT: 33.9 % — ABNORMAL LOW (ref 36.0–46.0)
Lymphocytes Relative: 67 % — ABNORMAL HIGH (ref 12–46)
Lymphs Abs: 18.7 10*3/uL — ABNORMAL HIGH (ref 0.7–4.0)
MCV: 97.1 fL (ref 78.0–100.0)
Monocytes Absolute: 0.8 10*3/uL (ref 0.1–1.0)
RBC: 3.49 MIL/uL — ABNORMAL LOW (ref 3.87–5.11)
WBC: 27.7 10*3/uL — ABNORMAL HIGH (ref 4.0–10.5)

## 2012-03-22 NOTE — Progress Notes (Signed)
Labs drawn today for cbc/diff 

## 2012-03-23 ENCOUNTER — Encounter (HOSPITAL_COMMUNITY): Payer: Self-pay | Admitting: Oncology

## 2012-03-23 ENCOUNTER — Encounter (HOSPITAL_BASED_OUTPATIENT_CLINIC_OR_DEPARTMENT_OTHER): Payer: Medicare Other | Admitting: Oncology

## 2012-03-23 VITALS — BP 146/69 | HR 88 | Temp 97.3°F | Resp 18 | Wt 155.0 lb

## 2012-03-23 DIAGNOSIS — J209 Acute bronchitis, unspecified: Secondary | ICD-10-CM

## 2012-03-23 DIAGNOSIS — N6459 Other signs and symptoms in breast: Secondary | ICD-10-CM

## 2012-03-23 DIAGNOSIS — B372 Candidiasis of skin and nail: Secondary | ICD-10-CM

## 2012-03-23 DIAGNOSIS — C9110 Chronic lymphocytic leukemia of B-cell type not having achieved remission: Secondary | ICD-10-CM

## 2012-03-23 DIAGNOSIS — D649 Anemia, unspecified: Secondary | ICD-10-CM

## 2012-03-23 MED ORDER — AMOXICILLIN-POT CLAVULANATE 875-125 MG PO TABS: 1.0000 | ORAL_TABLET | Freq: Two times a day (BID) | ORAL | Status: DC

## 2012-03-23 MED ORDER — NYSTATIN 100000 UNIT/GM EX POWD: Freq: Four times a day (QID) | CUTANEOUS | Status: DC

## 2012-03-23 NOTE — Progress Notes (Signed)
1.CLL not in need of chemotherapy 2.Multifactorial anemia 3.Acute bronchitis since before Christmas and she is now awakening at night unable to sleep in her bed and she is now in her recliner from about 4 am on due to her coughing and phlegm production.  She has no fever or shaking chills.She is coughing up brown phlegm. 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 She just does not feel well with this respiratory illness. Her WBC is slowly rising but her HGB is stable lately, perhaps slightly lower but not in need of resuming Procrit. Her plts remain very stable. VS are stable otherwise and she is alert and oriented.Her lungs are actually clear without wheezing, rales, or rubs but she coughs frequently. Her heart shows a Grade I/6 systolic ejection murmur c/w mild aortic stenosis. There is no S3 gallop. There is no adenopathy in any location.she has no Hepatosplenomegaly that I can appreciate. She does have a rash beneath both breasts extending for some ways both onto the lower breast and lower chest wall(about 11-13 cm) c/w candidiasis of the skin, probably due to her immune deficiency from her CLL. Therefore we will treat her with augmentin for 7 days, BID, and Nystatin powder QID for 1-2 months if needed and will ask her to call us in 7-10 days to let us know if she is better. I will also repeat her Immunoglobulins in 8 weeks to see if they are stable or any worse.  If she is nobetter sooner, she will call us. She will also push fluids.  If not better, she may need IVIG

## 2012-03-23 NOTE — Patient Instructions (Addendum)
.  [facility] Discharge Instructions  RECOMMENDATIONS MADE BY THE CONSULTANT AND ANY TEST RESULTS WILL BE SENT TO YOUR REFERRING PHYSICIAN.  EXAM FINDINGS BY THE PHYSICIAN TODAY AND SIGNS OR SYMPTOMS TO REPORT TO CLINIC OR PRIMARY PHYSICIAN: Exam per Dr. Mariel Sleet.  MEDICATIONS PRESCRIBED:  Antibiotic Rx sent to [facility] Nystatin powder --apply to rash as needed until completely gone.  INSTRUCTIONS GIVEN AND DISCUSSED: Call us in 10 days to let us know how you are Push fluids  SPECIAL INSTRUCTIONS/FOLLOW-UP: 4 months to see Jenita Seashore PA  Thank you for choosing [facility] to provide your oncology and hematology care.  To afford each patient quality time with our providers, please arrive at least 15 minutes before your scheduled appointment time.  With your help, our goal is to use those 15 minutes to complete the necessary work-up to ensure our physicians have the information they need to help with your evaluation and healthcare recommendations.    Effective January 1st, 2014, we ask that you re-schedule your appointment with our physicians should you arrive 10 or more minutes late for your appointment.  We strive to give you quality time with our providers, and arriving late affects you and other patients whose appointments are after yours.    Again, thank you for choosing [facility].  Our hope is that these requests will decrease the amount of time that you wait before being seen by our physicians.       _____________________________________________________________  Should you have questions after your visit to [facility], please contact our office at [phone] between the hours of 8:30 a.m. and 5:00 p.m.  Voicemails left after 4:30 p.m. will not be returned until the following business day.  For prescription refill requests, have your pharmacy contact our office with your prescription refill request.

## 2012-03-24 ENCOUNTER — Ambulatory Visit (INDEPENDENT_AMBULATORY_CARE_PROVIDER_SITE_OTHER): Payer: Medicare Other | Admitting: *Deleted

## 2012-03-24 DIAGNOSIS — Z7901 Long term (current) use of anticoagulants: Secondary | ICD-10-CM

## 2012-03-30 ENCOUNTER — Telehealth (HOSPITAL_COMMUNITY): Payer: Self-pay | Admitting: Oncology

## 2012-03-30 NOTE — Telephone Encounter (Signed)
[name] called stating she completed her course of antibiotics for bronchitis & congestion - she states she is feeling "much better" and that she still has some phlegm that is clear and no longer brown; very mild and non-productive cough.  Also [name] denies any fevers, nausea, chest pain.  She will not be back in our clinic until March & May for labs and in May to see Tom.

## 2012-03-31 ENCOUNTER — Ambulatory Visit (INDEPENDENT_AMBULATORY_CARE_PROVIDER_SITE_OTHER): Payer: Medicare Other | Admitting: *Deleted

## 2012-03-31 DIAGNOSIS — Z7901 Long term (current) use of anticoagulants: Secondary | ICD-10-CM

## 2012-04-05 ENCOUNTER — Encounter (HOSPITAL_COMMUNITY): Payer: Self-pay | Admitting: *Deleted

## 2012-04-05 ENCOUNTER — Inpatient Hospital Stay (HOSPITAL_COMMUNITY)
Admission: EM | Admit: 2012-04-05 | Discharge: 2012-04-09 | DRG: 291 | Disposition: A | Discharge status: Home / Self Care | Payer: Medicare Other | Attending: Pulmonary Disease | Admitting: Pulmonary Disease

## 2012-04-05 ENCOUNTER — Emergency Department (HOSPITAL_COMMUNITY): Payer: Medicare Other

## 2012-04-05 ENCOUNTER — Other Ambulatory Visit: Payer: Self-pay

## 2012-04-05 ENCOUNTER — Ambulatory Visit (INDEPENDENT_AMBULATORY_CARE_PROVIDER_SITE_OTHER): Payer: Medicare Other | Admitting: Adult Health

## 2012-04-05 ENCOUNTER — Encounter: Payer: Self-pay | Admitting: Adult Health

## 2012-04-05 ENCOUNTER — Ambulatory Visit (HOSPITAL_COMMUNITY)
Admission: RE | Admit: 2012-04-05 | Discharge: 2012-04-05 | Disposition: A | Discharge status: Home / Self Care | Payer: Medicare Other | Source: Ambulatory Visit | Attending: Adult Health | Admitting: Adult Health

## 2012-04-05 ENCOUNTER — Other Ambulatory Visit: Payer: Self-pay | Admitting: Adult Health

## 2012-04-05 VITALS — BP 170/80 | HR 86 | Ht 60.0 in | Wt 157.0 lb

## 2012-04-05 DIAGNOSIS — I4891 Unspecified atrial fibrillation: Secondary | ICD-10-CM | POA: Diagnosis present

## 2012-04-05 DIAGNOSIS — N189 Chronic kidney disease, unspecified: Secondary | ICD-10-CM | POA: Diagnosis present

## 2012-04-05 DIAGNOSIS — E785 Hyperlipidemia, unspecified: Secondary | ICD-10-CM | POA: Diagnosis present

## 2012-04-05 DIAGNOSIS — M1990 Unspecified osteoarthritis, unspecified site: Secondary | ICD-10-CM | POA: Diagnosis present

## 2012-04-05 DIAGNOSIS — A0472 Enterocolitis due to Clostridium difficile, not specified as recurrent: Secondary | ICD-10-CM | POA: Diagnosis present

## 2012-04-05 DIAGNOSIS — I359 Nonrheumatic aortic valve disorder, unspecified: Secondary | ICD-10-CM | POA: Diagnosis present

## 2012-04-05 DIAGNOSIS — Z950 Presence of cardiac pacemaker: Secondary | ICD-10-CM

## 2012-04-05 DIAGNOSIS — I5033 Acute on chronic diastolic (congestive) heart failure: Principal | ICD-10-CM | POA: Diagnosis present

## 2012-04-05 DIAGNOSIS — I2510 Atherosclerotic heart disease of native coronary artery without angina pectoris: Secondary | ICD-10-CM | POA: Diagnosis present

## 2012-04-05 DIAGNOSIS — R0602 Shortness of breath: Secondary | ICD-10-CM

## 2012-04-05 DIAGNOSIS — D1330 Benign neoplasm of unspecified part of small intestine: Secondary | ICD-10-CM | POA: Diagnosis present

## 2012-04-05 DIAGNOSIS — Z7901 Long term (current) use of anticoagulants: Secondary | ICD-10-CM

## 2012-04-05 DIAGNOSIS — C9110 Chronic lymphocytic leukemia of B-cell type not having achieved remission: Secondary | ICD-10-CM | POA: Diagnosis present

## 2012-04-05 DIAGNOSIS — I350 Nonrheumatic aortic (valve) stenosis: Secondary | ICD-10-CM

## 2012-04-05 DIAGNOSIS — D630 Anemia in neoplastic disease: Secondary | ICD-10-CM | POA: Diagnosis present

## 2012-04-05 DIAGNOSIS — I509 Heart failure, unspecified: Secondary | ICD-10-CM

## 2012-04-05 DIAGNOSIS — I480 Paroxysmal atrial fibrillation: Secondary | ICD-10-CM

## 2012-04-05 DIAGNOSIS — Z79899 Other long term (current) drug therapy: Secondary | ICD-10-CM

## 2012-04-05 DIAGNOSIS — K589 Irritable bowel syndrome without diarrhea: Secondary | ICD-10-CM | POA: Diagnosis present

## 2012-04-05 DIAGNOSIS — I495 Sick sinus syndrome: Secondary | ICD-10-CM | POA: Diagnosis present

## 2012-04-05 DIAGNOSIS — J90 Pleural effusion, not elsewhere classified: Secondary | ICD-10-CM

## 2012-04-05 DIAGNOSIS — I10 Essential (primary) hypertension: Secondary | ICD-10-CM

## 2012-04-05 DIAGNOSIS — R918 Other nonspecific abnormal finding of lung field: Secondary | ICD-10-CM | POA: Insufficient documentation

## 2012-04-05 DIAGNOSIS — K219 Gastro-esophageal reflux disease without esophagitis: Secondary | ICD-10-CM | POA: Diagnosis present

## 2012-04-05 DIAGNOSIS — I447 Left bundle-branch block, unspecified: Secondary | ICD-10-CM | POA: Diagnosis present

## 2012-04-05 DIAGNOSIS — J189 Pneumonia, unspecified organism: Secondary | ICD-10-CM | POA: Diagnosis present

## 2012-04-05 LAB — PRO B NATRIURETIC PEPTIDE
Pro B Natriuretic peptide (BNP): 1475 pg/mL — ABNORMAL HIGH (ref <451)
Pro B Natriuretic peptide (BNP): 1598 pg/mL — ABNORMAL HIGH (ref 0–450)

## 2012-04-05 LAB — CBC WITH DIFFERENTIAL/PLATELET
Eosinophils Relative: 3 % (ref 0–5)
HCT: 31.4 % — ABNORMAL LOW (ref 36.0–46.0)
Hemoglobin: 10.2 g/dL — ABNORMAL LOW (ref 12.0–15.0)
Lymphocytes Relative: 52 % — ABNORMAL HIGH (ref 12–46)
Lymphs Abs: 7.9 10*3/uL — ABNORMAL HIGH (ref 0.7–4.0)
MCV: 95.7 fL (ref 78.0–100.0)
Monocytes Relative: 3 % (ref 3–12)
Neutro Abs: 6.4 10*3/uL (ref 1.7–7.7)
RBC: 3.28 MIL/uL — ABNORMAL LOW (ref 3.87–5.11)
WBC: 15.3 10*3/uL — ABNORMAL HIGH (ref 4.0–10.5)

## 2012-04-05 LAB — COMPREHENSIVE METABOLIC PANEL
AST: 23 U/L (ref 0–37)
Albumin: 3.6 g/dL (ref 3.5–5.2)
Albumin: 3.9 g/dL (ref 3.5–5.2)
Alkaline Phosphatase: 101 U/L (ref 39–117)
Alkaline Phosphatase: 104 U/L (ref 39–117)
BUN: 32 mg/dL — ABNORMAL HIGH (ref 6–23)
CO2: 24 mEq/L (ref 19–32)
Chloride: 105 mEq/L (ref 96–112)
Chloride: 104 mEq/L (ref 96–112)
Creatinine, Ser: 1.21 mg/dL — ABNORMAL HIGH (ref 0.50–1.10)
GFR calc non Af Amer: 37 mL/min — ABNORMAL LOW (ref >90)
Glucose, Bld: 126 mg/dL — ABNORMAL HIGH (ref 70–99)
Potassium: 5.5 mEq/L — ABNORMAL HIGH (ref 3.5–5.3)
Potassium: 4.6 mEq/L (ref 3.5–5.1)
Sodium: 140 mEq/L (ref 135–145)
Total Bilirubin: 0.3 mg/dL (ref 0.3–1.2)
Total Protein: 6.9 g/dL (ref 6.0–8.3)

## 2012-04-05 LAB — PROTIME-INR: INR: 2.1 — ABNORMAL HIGH (ref 0.00–1.49)

## 2012-04-05 MED ORDER — POTASSIUM CHLORIDE CRYS ER 20 MEQ PO TBCR
20.0000 meq | EXTENDED_RELEASE_TABLET | Freq: Every day | ORAL | Status: DC
  Administered 2012-04-06 – 2012-04-09 (×5): 20 meq via ORAL
  Filled 2012-04-05 (×5): qty 1

## 2012-04-05 MED ORDER — ACETAMINOPHEN 500 MG PO TABS
1000.0000 mg | ORAL_TABLET | Freq: Four times a day (QID) | ORAL | Status: DC
  Administered 2012-04-05 – 2012-04-07 (×8): 1000 mg via ORAL
  Administered 2012-04-08: 500 mg via ORAL
  Administered 2012-04-08 – 2012-04-09 (×4): 1000 mg via ORAL
  Filled 2012-04-05 (×14): qty 2

## 2012-04-05 MED ORDER — WARFARIN SODIUM 2.5 MG PO TABS
1.2500 mg | ORAL_TABLET | ORAL | Status: DC
  Administered 2012-04-07: 1.25 mg via ORAL
  Filled 2012-04-05: qty 1

## 2012-04-05 MED ORDER — FUROSEMIDE 10 MG/ML IJ SOLN
40.0000 mg | Freq: Once | INTRAMUSCULAR | Status: AC
  Administered 2012-04-05: 40 mg via INTRAVENOUS
  Filled 2012-04-05: qty 4

## 2012-04-05 MED ORDER — ACETAMINOPHEN 500 MG PO TABS: 500.0000 mg | ORAL_TABLET | ORAL | Status: DC | PRN

## 2012-04-05 MED ORDER — LISINOPRIL 10 MG PO TABS
20.0000 mg | ORAL_TABLET | Freq: Every morning | ORAL | Status: DC
  Administered 2012-04-06 – 2012-04-09 (×4): 20 mg via ORAL
  Filled 2012-04-05 (×4): qty 2

## 2012-04-05 MED ORDER — CALCIUM CARBONATE-VITAMIN D 500-200 MG-UNIT PO TABS
1.0000 | ORAL_TABLET | Freq: Every morning | ORAL | Status: DC
  Administered 2012-04-06 – 2012-04-09 (×4): 1 via ORAL
  Filled 2012-04-05 (×6): qty 1

## 2012-04-05 MED ORDER — METOPROLOL TARTRATE 25 MG PO TABS
25.0000 mg | ORAL_TABLET | Freq: Two times a day (BID) | ORAL | Status: DC
  Administered 2012-04-05 – 2012-04-09 (×8): 25 mg via ORAL
  Filled 2012-04-05 (×8): qty 1

## 2012-04-05 MED ORDER — VERAPAMIL HCL ER 180 MG PO TBCR
180.0000 mg | EXTENDED_RELEASE_TABLET | Freq: Every day | ORAL | Status: DC
  Administered 2012-04-05 – 2012-04-08 (×4): 180 mg via ORAL
  Filled 2012-04-05 (×6): qty 1

## 2012-04-05 MED ORDER — SIMVASTATIN 5 MG PO TABS: 5.0000 mg | ORAL_TABLET | Freq: Every day | ORAL | Status: DC

## 2012-04-05 MED ORDER — VERAPAMIL HCL ER 180 MG PO TBCR
EXTENDED_RELEASE_TABLET | ORAL | Status: AC
  Filled 2012-04-05: qty 1

## 2012-04-05 MED ORDER — SODIUM CHLORIDE 0.9 % IJ SOLN
3.0000 mL | Freq: Two times a day (BID) | INTRAMUSCULAR | Status: DC
  Administered 2012-04-06 – 2012-04-07 (×2): 3 mL via INTRAVENOUS

## 2012-04-05 MED ORDER — SODIUM CHLORIDE 0.9 % IJ SOLN: 3.0000 mL | INTRAMUSCULAR | Status: DC | PRN

## 2012-04-05 MED ORDER — SODIUM CHLORIDE 0.9 % IV SOLN: 250.0000 mL | INTRAVENOUS | Status: DC | PRN

## 2012-04-05 MED ORDER — WARFARIN SODIUM 2.5 MG PO TABS
2.5000 mg | ORAL_TABLET | ORAL | Status: DC
  Administered 2012-04-06 – 2012-04-08 (×3): 2.5 mg via ORAL
  Filled 2012-04-05 (×2): qty 1
  Filled 2012-04-05: qty 2
  Filled 2012-04-05: qty 1
  Filled 2012-04-05: qty 2

## 2012-04-05 MED ORDER — ONDANSETRON HCL 4 MG/2ML IJ SOLN
4.0000 mg | Freq: Three times a day (TID) | INTRAMUSCULAR | Status: AC | PRN
  Administered 2012-04-05: 4 mg via INTRAVENOUS
  Filled 2012-04-05: qty 2

## 2012-04-05 MED ORDER — WARFARIN - PHYSICIAN DOSING INPATIENT
Freq: Every day | Status: DC
  Administered 2012-04-06 – 2012-04-07 (×2)

## 2012-04-05 MED ORDER — NITROGLYCERIN 0.4 MG SL SUBL: 0.4000 mg | SUBLINGUAL_TABLET | SUBLINGUAL | Status: DC | PRN

## 2012-04-05 MED ORDER — ADULT MULTIVITAMIN W/MINERALS CH
1.0000 | ORAL_TABLET | Freq: Every morning | ORAL | Status: DC
  Administered 2012-04-06 – 2012-04-09 (×4): 1 via ORAL
  Filled 2012-04-05 (×4): qty 1

## 2012-04-05 MED ORDER — ATORVASTATIN CALCIUM 10 MG PO TABS
10.0000 mg | ORAL_TABLET | Freq: Every day | ORAL | Status: DC
  Administered 2012-04-06 – 2012-04-08 (×4): 10 mg via ORAL
  Filled 2012-04-05 (×6): qty 1

## 2012-04-05 MED ORDER — FUROSEMIDE 10 MG/ML IJ SOLN
40.0000 mg | Freq: Every day | INTRAMUSCULAR | Status: DC
  Filled 2012-04-05: qty 4

## 2012-04-05 NOTE — Assessment & Plan Note (Signed)
She remains in NSR and on anticoagulation. Labs will evaluate for anemia causing symptoms of dyspnea. Will review when available.

## 2012-04-05 NOTE — Assessment & Plan Note (Signed)
Holosystolic murmur is auscultated but doubt relation to current symptoms. Continue medical management.

## 2012-04-05 NOTE — Patient Instructions (Addendum)
Your physician recommends that you schedule a follow-up appointment in: 1 week  Your physician recommends that you return for lab work in: NOW  A chest x-ray takes a picture of the organs and structures inside the chest, including the heart, lungs, and blood vessels. This test can show several things, including, whether the heart is enlarges; whether fluid is building up in the lungs; and whether pacemaker / defibrillator leads are still in place.

## 2012-04-05 NOTE — Assessment & Plan Note (Signed)
Blood pressure is elevated on visit today. This may be related to fluid overload, but anxiety can not be ruled out. Will follow up review of CXR and labs. She may need admission for decompensated CHF.

## 2012-04-05 NOTE — Progress Notes (Signed)
Patient notified of abnormal lab and chest x ray.  Advised, per Joni Reining, NP that she needed to have someone bring her to the emergency room for admission.  Verbalized understanding.

## 2012-04-05 NOTE — Assessment & Plan Note (Addendum)
She is no longer on diuretics but exhibits symptoms of CHF at present. She is hypertensive on exam, with decreased breath sounds in the right base. Will have labs drawn to include D-dimer, Pro-BNP, CBC,and BMET, Chest -Xray will be completed as well. I have asked her to take one dose of lasix 20 mg when she returns home. If labs are abnormal will have her to to ER for admission and diureses with worsening symptoms. She is taken over by wheel chair for labs and X-ray.   ADDENDUM: I have reviewed labs and CXR. She is found to be in mild CHF with elevated D-dimer of 1, 475, elevated D-Dimer of >56, Chest X-ray demonstrates new right basilar infiltrate. She will be sent to ER for evaluation and probable admission for diureses and treatment of dyspnea. Call made to her home by Reather Laurence, RN in our office.

## 2012-04-05 NOTE — ED Notes (Addendum)
Sob for 3 days, says she is supposed to be admitted for chf. Alert, No distress at present.  Says she usually has problem with sob at night and cannot lie down.

## 2012-04-05 NOTE — Progress Notes (Deleted)
Name: [name]    DOB: 1915/10/26  Age: 79 y.o.  MR#: [id number]       PCP:  [name], MD      Insurance: @PAYORNAME @   CC:   No chief complaint on file.   VS BP 170/80  Pulse 86  Ht 5' (1.524 m)  Wt 157 lb (71.215 kg)  BMI 30.66 kg/m2  SpO2 93%  Weights Current Weight  04/05/12 157 lb (71.215 kg)  03/23/12 155 lb (70.308 kg)  01/28/12 157 lb (71.215 kg)    Blood Pressure  BP Readings from Last 3 Encounters:  04/05/12 170/80  03/23/12 146/69  01/28/12 122/60     Admit date:  (Not on file) Last encounter with RMR:  Visit date not found   Allergy Allergies  Allergen Reactions  . Detrol (Tolterodine Tartrate) Other (See Comments)    Patient states: "I was told to never take it again, I do not recall the reaction"  . Sanctura (Trospium Chloride) Nausea Only and Other (See Comments)    Intense lower abd pain  . Sulfa Antibiotics Other (See Comments)    Extreme dizziness  . Sulfonamide Derivatives Other (See Comments)    REACTION: GI distress    Current Outpatient Prescriptions  Medication Sig Dispense Refill  . acetaminophen (TYLENOL) 500 MG tablet Take 1,000 mg by mouth 4 (four) times daily.        . calcium-vitamin D (OSCAL WITH D) 500-200 MG-UNIT per tablet Take 1 tablet by mouth daily.        [name] lisinopril (PRINIVIL,ZESTRIL) 20 MG tablet Take 1 tablet (20 mg total) by mouth daily.  90 tablet  3  . metoprolol tartrate (LOPRESSOR) 25 MG tablet Take 1 tablet (25 mg total) by mouth 2 (two) times daily.  180 tablet  3  . Multiple Vitamins-Minerals (MULTIVITAMIN WITH MINERALS) tablet Take 1 tablet by mouth daily.        . nitroGLYCERIN (NITROSTAT) 0.4 MG SL tablet Place 1 tablet (0.4 mg total) under the tongue every 5 (five) minutes as needed for chest pain.  25 tablet  11  . nystatin (MYCOSTATIN) powder Apply topically 4 (four) times daily.  30 g  2  . pravastatin (PRAVACHOL) 40 MG tablet Take 40 mg by mouth daily.        . verapamil (CALAN-SR) 180 MG CR  tablet Take 1 tablet (180 mg total) by mouth at bedtime.  90 tablet  3  . warfarin (COUMADIN) 2.5 MG tablet Take 2.5 mg by mouth as directed. 2.5 mg daily except she takes 1/2 tablet on thursdays       No current facility-administered medications for this visit.   Facility-Administered Medications Ordered in Other Visits  Medication Dose Route Frequency Provider Last Rate Last Dose  . epoetin alfa (EPOGEN,PROCRIT) injection 40,000 Units  40,000 Units Subcutaneous Once Randall An, MD        Discontinued Meds:    Medications Discontinued During This Encounter  Medication Reason  . amoxicillin-clavulanate (AUGMENTIN) 875-125 MG per tablet Error    Patient Active Problem List  Diagnosis  . HYPERLIPIDEMIA  . HYPERTENSION  . Arteriosclerotic cardiovascular disease (ASCVD)  . Chronic anticoagulation  . Sick sinus syndrome  . Aortic stenosis  . Left bundle branch block  . Congestive heart failure  . Chronic kidney disease  . Gastroesophageal reflux disease  . Chronic lymphocytic leukemia  . Transient blindness  . Paroxysmal atrial fibrillation  . Pacemaker    LABS Anti-coag visit  on 03/31/2012  Component Date Value  . INR 03/31/2012 1.6   Anti-coag visit on 03/24/2012  Component Date Value  . INR 03/24/2012 2.6   Infusion on 03/22/2012  Component Date Value  . WBC 03/22/2012 27.7*  . RBC 03/22/2012 3.49*  . Hemoglobin 03/22/2012 10.8*  . HCT 03/22/2012 33.9*  . MCV 03/22/2012 97.1   . [facility] 03/22/2012 30.9   . MCHC 03/22/2012 31.9   . RDW 03/22/2012 13.7   . Platelets 03/22/2012 264   . Neutrophils Relative 03/22/2012 28*  . Neutro Abs 03/22/2012 7.7   . Lymphocytes Relative 03/22/2012 67*  . Lymphs Abs 03/22/2012 18.7*  . Monocytes Relative 03/22/2012 3   . Monocytes Absolute 03/22/2012 0.8   . Eosinophils Relative 03/22/2012 2   . Eosinophils Absolute 03/22/2012 0.4   . Basophils Relative 03/22/2012 0   . Basophils Absolute 03/22/2012 0.1   . WBC  Morphology 03/22/2012 ABSOLUTE LYMPHOCYTOSIS   Anti-coag visit on 02/25/2012  Component Date Value  . INR 02/25/2012 2.1   Anti-coag visit on 01/28/2012  Component Date Value  . INR 01/28/2012 2.1   Infusion on 01/26/2012  Component Date Value  . WBC 01/26/2012 23.5*  . RBC 01/26/2012 3.48*  . Hemoglobin 01/26/2012 10.8*  . HCT 01/26/2012 33.9*  . MCV 01/26/2012 97.4   . [facility] 01/26/2012 31.0   . MCHC 01/26/2012 31.9   . RDW 01/26/2012 13.9   . Platelets 01/26/2012 242   . Neutrophils Relative 01/26/2012 26*  . Neutro Abs 01/26/2012 6.2   . Lymphocytes Relative 01/26/2012 68*  . Lymphs Abs 01/26/2012 16.1*  . Monocytes Relative 01/26/2012 3   . Monocytes Absolute 01/26/2012 0.7   . Eosinophils Relative 01/26/2012 2   . Eosinophils Absolute 01/26/2012 0.5   . Basophils Relative 01/26/2012 0   . Basophils Absolute 01/26/2012 0.1   . WBC Morphology 01/26/2012 ABSOLUTE LYMPHOCYTOSIS   Anti-coag visit on 01/14/2012  Component Date Value  . INR 01/14/2012 1.5      Results for this Opt Visit:     Results for orders placed in visit on 03/31/12  POCT INR      Component Value Range   INR 1.6      EKG Orders placed in visit on 01/28/12  . EKG 12-LEAD     Prior Assessment and Plan Problem List as of 04/05/2012            Cardiology Problems   HYPERLIPIDEMIA   Last Assessment & Plan Note   01/28/2012 Office Visit Addendum 01/29/2012 10:06 AM by Kathlen Brunswick, MD    Total and LDL cholesterol were extremely low in the profile performed 08/2009, which is the most recent test available to me.  It is doubtful there has been a significant increase, and the benefit pharmacologic treatment at this advanced age is somewhat uncertain.  Current therapy will be continued without further testing.    HYPERTENSION   Last Assessment & Plan Note   01/28/2012 Office Visit Signed 01/28/2012  2:32 PM by Kathlen Brunswick, MD    Blood pressure control has been excellent with current  antihypertensive medications.    Arteriosclerotic cardiovascular disease (ASCVD)   Last Assessment & Plan Note   01/28/2012 Office Visit Addendum 01/28/2012  2:28 PM by Kathlen Brunswick, MD    Current symptoms could be related to progression of coronary disease, but patient is doing well with medical therapy, which will be continued.    Sick sinus syndrome   Last Assessment &  Plan Note   11/19/2011 Office Visit Signed 11/20/2011  8:59 AM by Kathlen Brunswick, MD    Paroxysmal atrial fibrillation; dual-chamber pacemaker in 11/2006; Last evaluated by Dr. Ladona Ridgel in 10/2011-he will continue to follow her as appropriate.    Aortic stenosis   Last Assessment & Plan Note   01/28/2012 Office Visit Signed 01/28/2012  2:27 PM by Kathlen Brunswick, MD    Moderate aortic stenosis, which is not clearly the cause of her current symptoms.  No active congestive heart failure in the absence of diuretic therapy.  No further testing or intervention is appropriate at present.    Left bundle branch block   Congestive heart failure   Last Assessment & Plan Note   01/28/2012 Office Visit Signed 01/28/2012  2:30 PM by Kathlen Brunswick, MD    Compensated at present without diuretic therapy.  No change in current treatment is necessary.    Paroxysmal atrial fibrillation   Last Assessment & Plan Note   01/28/2012 Office Visit Signed 01/28/2012  2:30 PM by Kathlen Brunswick, MD    EKG shows sinus rhythm at this visit.  No symptoms attributable to atrial fibrillation.  Current therapy will continue.      Other   Chronic anticoagulation   Last Assessment & Plan Note   01/28/2012 Office Visit Signed 01/28/2012  2:36 PM by Kathlen Brunswick, MD    Most recent INR performed 2 weeks ago was subtherapeutic; continued monitoring and adjustment of warfarin dosage will be undertaken in our anticoagulation clinic.    Chronic kidney disease   Last Assessment & Plan Note   11/19/2011 Office Visit Signed 11/20/2011  8:54 AM by  Kathlen Brunswick, MD    Renal function remains stable and age appropriate.      Gastroesophageal reflux disease   Chronic lymphocytic leukemia   Last Assessment & Plan Note   04/27/2011 Office Visit Signed 04/27/2011  5:36 PM by Kathlen Brunswick, MD    White count has been stable and not markedly elevated, most recently 16,200.  Dr. Mariel Sleet continues to follow this problem, which has not required much treatment.    Transient blindness   Last Assessment & Plan Note   09/30/2011 Office Visit Addendum 09/30/2011 11:42 AM by Dyann Kief, PA    Resolved and without recurrence    Pacemaker   Last Assessment & Plan Note   10/15/2011 Office Visit Signed 10/15/2011 11:41 AM by Marinus Maw, MD    Her device is working normally. We'll plan to recheck in several months. Today we reprogrammed her device from DD IR to DDDR.        Imaging: No results found.   FRS Calculation: Score not calculated

## 2012-04-05 NOTE — ED Notes (Signed)
MD at bedside. 

## 2012-04-05 NOTE — ED Provider Notes (Signed)
History  This chart was scribed for Glynn Octave, MD by Erskine Emery, ED Scribe. This patient was seen in room [facility]17/[facility] and the patient's care was started at 18:09.   CSN: [id number]  Arrival date & time 04/05/12  1750   First MD Initiated Contact with Patient 04/05/12 1809      Chief Complaint  Patient presents with  . Shortness of Breath    (Consider location/radiation/quality/duration/timing/severity/associated sxs/prior treatment) The history is provided by the patient. No language interpreter was used.  [name] is a 79 y.o. female who presents to the Emergency Department complaining of shortness of breath for the past 3 days. Pt does not know if she has had a fever, but she has had some mild chest pain, thoracic back pain, hip pain, loss of appetite, and mild leg swelling. She denies any associated rashes, emesis, abdominal pain (when not being palpated), or difficult bowel movements. Pt has a h/o shortness of breath, especially at night, that is aggravated by laying down. Pt has a pacemaker and does not think she has any h/o heart attacks or stent placements. Pt reports she is supposed to be admitted for congestive heart failure after evaluations of her recent x-rays and lab work with Dr. Bailey Mech. Pt denies any recent change in medication but reports after her recent evaluation she was told to take furosemide and potassium but she only took the furosemide.   Pt's PCP is Dr. Juanetta Gosling.  Past Medical History  Diagnosis Date  . Sick sinus syndrome 11/2006    paroxysmal atrial fibrillation; dual-chamber pacemaker in 11/2006  . Aortic stenosis     mild  . Left bundle branch block   . Congestive heart failure     Congestive heart failure with normal ejection fraction  . Arteriosclerotic cardiovascular disease (ASCVD)     coronary angio in 10/09:50% left anterior descending; 80% PDA; medical therapy advisedOrthostatic decrease in blood pressure  . Epistaxis    mild with negative ENT evaluation  . Dizziness     chronic  . Osteoarthritis   . Irritable bowel syndrome   . Urinary frequency     with incontinence  . Gastroesophageal reflux disease     Esophageal dilatation for stricture in 09/2011  . Rosacea   . Asthma     Childhood asthma  . Chronic lymphocytic leukemia     Stage I with anemia H./H. of 9/27 in 11/09 with normal MCV; iron deficiency in bone marrow  . Orthostatic hypotension   . Chronic anticoagulation   . Diverticulosis   . 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 Chronic kidney disease      Creatinine of 1.7 in 8/08, 1.4 in 10/08 and 1.5 in 11/09    Past Surgical History  Procedure Date  . Cholecystectomy   . Cervical spine surgery   . Lumbar disc surgery   . Bladder suspension   . Abdominal hysterectomy 1972  . Cataract extraction     Bilateral  . Esophagogastroduodenoscopy 01/09/09    Rourk -noncritical Schatzki's ring, small hiatal hernia, fundal gland polyps, bile-stained gastric mucosa, polypoid lesion straddling D1 and D2 1-2 cm, localized lymphangitic-appearing mucosa at D2 biopsied (adenomatous polyp with high grade glandular dysplasia  . A-v cardiac pacemaker insertion 11/2006    [employer]    Family History  Problem Relation Age of Onset  . Heart failure Father   . Cancer Father   .  Diabetes Mother     History  Substance Use Topics  . Smoking status: Never Smoker   . Smokeless tobacco: Never Used  . Alcohol Use: No    OB History    Grav Para Term Preterm Abortions TAB SAB Ect Mult Living   2 2 2       2       Review of Systems A complete 10 system review of systems was obtained and all systems are negative except as noted in the HPI and PMH.    Allergies  Detrol; Sanctura; Sulfa antibiotics; and Sulfonamide derivatives  Home Medications   Current Outpatient Rx  Name  Route  Sig  Dispense  Refill  .  ACETAMINOPHEN 500 MG PO TABS   Oral   Take 1,000 mg by mouth 4 (four) times daily.           [name] CALCIUM CARBONATE-VITAMIN D 500-200 MG-UNIT PO TABS   Oral   Take 1 tablet by mouth every morning.          . FUROSEMIDE 20 MG PO TABS   Oral   Take 20 mg by mouth daily.         [name] LISINOPRIL 20 MG PO TABS   Oral   Take 20 mg by mouth every morning.         [name] METOPROLOL TARTRATE 25 MG PO TABS   Oral   Take 1 tablet (25 mg total) by mouth 2 (two) times daily.   180 tablet   3     Please call for office visit   . MULTI-VITAMIN/MINERALS PO TABS   Oral   Take 1 tablet by mouth every morning.          [name] NITROGLYCERIN 0.4 MG SL SUBL   Sublingual   Place 1 tablet (0.4 mg total) under the tongue every 5 (five) minutes as needed for chest pain.   25 tablet   11   . NYSTATIN 100000 UNIT/GM EX POWD   Topical   Apply topically 4 (four) times daily.   30 g   2   . POTASSIUM CHLORIDE CRYS ER 20 MEQ PO TBCR   Oral   Take 20 mEq by mouth daily.         [name] PRAVASTATIN SODIUM 40 MG PO TABS   Oral   Take 40 mg by mouth at bedtime.          [name] VERAPAMIL HCL ER 180 MG PO TBCR   Oral   Take 1 tablet (180 mg total) by mouth at bedtime.   90 tablet   3   . WARFARIN SODIUM 2.5 MG PO TABS   Oral   Take 1.25-2.5 mg by mouth every evening. 2.5 mg daily except she takes 1/2 tablet on thursdays           Triage Vitals: BP 171/67  Pulse 98  Temp 98 F (36.7 C) (Oral)  Resp 18  Ht 5' (1.524 m)  Wt 157 lb (71.215 kg)  BMI 30.66 kg/m2  SpO2 97%  Physical Exam  Nursing note and vitals reviewed. Constitutional: She is oriented to person, place, and time. She appears well-developed and well-nourished. No distress.       Speaking in full sentences.  HENT:  Head: Normocephalic and atraumatic.  Eyes: EOM are normal. Pupils are equal, round, and reactive to light.  Neck: Neck supple. No tracheal deviation present.  Cardiovascular: Normal rate and regular rhythm.   Murmur  heard.  Systolic murmur is present with a grade of 3/6       3/6 systolic murmur at left sternal border.  Pulmonary/Chest: Effort normal and breath sounds normal. No respiratory distress. She has no wheezes.       Lungs are clear.  Abdominal: Soft. She exhibits no distension.  Musculoskeletal: Normal range of motion. She exhibits edema.       Trace pedal edema.  Neurological: She is alert and oriented to person, place, and time.  Skin: Skin is warm and dry.  Psychiatric: She has a normal mood and affect.    ED Course  Procedures (including critical care time) DIAGNOSTIC STUDIES: Oxygen Saturation is 97% on [place], adequate by my interpretation.    COORDINATION OF CARE: 18:28--I evaluated the patient and we discussed a treatment plan including furosemide and review of previous chest x-ray to which the pt agreed.  Labs Reviewed  PROTIME-INR - Abnormal; Notable for the following:    Prothrombin Time 22.7 (*)     INR 2.10 (*)     All other components within normal limits  CBC WITH DIFFERENTIAL - Abnormal; Notable for the following:    WBC 15.3 (*)     RBC 3.28 (*)     Hemoglobin 10.2 (*)     HCT 31.4 (*)     Neutrophils Relative 42 (*)     Lymphocytes Relative 52 (*)     Lymphs Abs 7.9 (*)     All other components within normal limits  COMPREHENSIVE METABOLIC PANEL - Abnormal; Notable for the following:    Glucose, Bld 121 (*)     BUN 32 (*)     Creatinine, Ser 1.21 (*)     GFR calc non Af Amer 37 (*)     GFR calc Af Amer 43 (*)     All other components within normal limits  PRO B NATRIURETIC PEPTIDE - Abnormal; Notable for the following:    Pro B Natriuretic peptide (BNP) 1598.0 (*)     All other components within normal limits  TROPONIN I   Dg Chest 2 View  04/05/2012  *RADIOLOGY REPORT*  Clinical Data: Shortness of breath  CHEST - 2 VIEW  Comparison: 09/13/2011  Findings: The pacing device is again seen.  Cardiac shadow is stable in appearance.  Increasing infiltrate is  noted in the right lung base with associated small effusion.  This is new from prior exam.  IMPRESSION: New right basilar infiltrate.   Original Report Authenticated By: Alcide Clever, M.D.      1. CHF (congestive heart failure)   2. Pleural effusion       MDM  Patient from cardiology office with three-day history of worsening shortness of breath, orthopnea, dyspnea on exertion and PND. Call to say that she had an infiltrate on her chest x-ray with elevated BNP. She denies chest pain, cough or fever.  Suspect right-sided infiltrate is effusion rather than pneumonia. She's not had any cough or fever. She has a chronic leukocytosis from her leukemia.  INR therapeutic, doubt PE. She is given IV Lasix with good diuresis. She remains dyspneic with exertion and has difficulty laying flat. Admission discussed with Dr. Sudie Bailey.   Date: 04/05/2012  Rate: 96  Rhythm: paced  QRS Axis: normal  Intervals: normal  ST/T Wave abnormalities: normal  Conduction Disutrbances:none  Narrative Interpretation:   Old EKG Reviewed: unchanged        I personally performed the services described in this documentation, which was scribed in  my presence. The recorded information has been reviewed and is accurate.    Glynn Octave, MD 04/05/12 2113

## 2012-04-05 NOTE — Progress Notes (Addendum)
HPI: Mrs. Hamblen is a 79 y/o patient, looking younger than stated age, of Dr.Rothbart we are following for ongoing assessment and treatment of CAD, pacemaker in situ, hypertension, and AoV stenosis. She comes today with 3 days of increasing dyspnea and orthopnea causing her to have to get up out of bed around 2 am and sleep in her rocking chair. She states the she is having some pain with inspiration, and lower back pain over the last few days. She has become quite anxious about this and is tearful in the office today. She denies LEE, but admits to early saitey. She uses a walker for ambulation which she is finding more difficult due to breathing status.  Allergies  Allergen Reactions  . Detrol (Tolterodine Tartrate) Other (See Comments)    Patient states: "I was told to never take it again, I do not recall the reaction"  . Sanctura (Trospium Chloride) Nausea Only and Other (See Comments)    Intense lower abd pain  . Sulfa Antibiotics Other (See Comments)    Extreme dizziness  . Sulfonamide Derivatives Other (See Comments)    REACTION: GI distress    Current Outpatient Prescriptions  Medication Sig Dispense Refill  . acetaminophen (TYLENOL) 500 MG tablet Take 1,000 mg by mouth 4 (four) times daily.        . calcium-vitamin D (OSCAL WITH D) 500-200 MG-UNIT per tablet Take 1 tablet by mouth daily.        [name] lisinopril (PRINIVIL,ZESTRIL) 20 MG tablet Take 1 tablet (20 mg total) by mouth daily.  90 tablet  3  . metoprolol tartrate (LOPRESSOR) 25 MG tablet Take 1 tablet (25 mg total) by mouth 2 (two) times daily.  180 tablet  3  . Multiple Vitamins-Minerals (MULTIVITAMIN WITH MINERALS) tablet Take 1 tablet by mouth daily.        . nitroGLYCERIN (NITROSTAT) 0.4 MG SL tablet Place 1 tablet (0.4 mg total) under the tongue every 5 (five) minutes as needed for chest pain.  25 tablet  11  . nystatin (MYCOSTATIN) powder Apply topically 4 (four) times daily.  30 g  2  . pravastatin (PRAVACHOL) 40 MG  tablet Take 40 mg by mouth daily.        . verapamil (CALAN-SR) 180 MG CR tablet Take 1 tablet (180 mg total) by mouth at bedtime.  90 tablet  3  . warfarin (COUMADIN) 2.5 MG tablet Take 2.5 mg by mouth as directed. 2.5 mg daily except she takes 1/2 tablet on thursdays       No current facility-administered medications for this visit.   Facility-Administered Medications Ordered in Other Visits  Medication Dose Route Frequency Provider Last Rate Last Dose  . epoetin alfa (EPOGEN,PROCRIT) injection 40,000 Units  40,000 Units Subcutaneous Once Randall An, MD        Past Medical History  Diagnosis Date  . Sick sinus syndrome 11/2006    paroxysmal atrial fibrillation; dual-chamber pacemaker in 11/2006  . Aortic stenosis     mild  . Left bundle branch block   . Congestive heart failure     Congestive heart failure with normal ejection fraction  . Arteriosclerotic cardiovascular disease (ASCVD)     coronary angio in 10/09:50% left anterior descending; 80% PDA; medical therapy advisedOrthostatic decrease in blood pressure  . Epistaxis     mild with negative ENT evaluation  . Chronic kidney disease      Creatinine of 1.7 in 8/08, 1.4 in 10/08 and 1.5 in 11/09  .  Dizziness     chronic  . Osteoarthritis   . Irritable bowel syndrome   . Urinary frequency     with incontinence  . Gastroesophageal reflux disease     Esophageal dilatation for stricture in 09/2011  . Rosacea   . Asthma     Childhood asthma  . Chronic lymphocytic leukemia     Stage I with anemia H./H. of 9/27 in 11/09 with normal MCV; iron deficiency in bone marrow  . Orthostatic hypotension   . Chronic anticoagulation   . Diverticulosis   . Adenomatous polyp October 2010    (Proximal Small bowel) With high grade dysplasia in polypoid lesion straddling D1/D2    Past Surgical History  Procedure Date  . Cholecystectomy   . Cervical spine surgery   . Lumbar disc surgery   . Bladder suspension   . Abdominal  hysterectomy 1972  . A-v cardiac pacemaker insertion 11/2006    [employer]tr[employer]  . Cataract extraction     Bilateral  . Esophagogastroduodenoscopy 01/09/09    Rourk -noncritical Schatzki's ring, small hiatal hernia, fundal gland polyps, bile-stained gastric mucosa, polypoid lesion straddling D1 and D2 1-2 cm, localized lymphangitic-appearing mucosa at D2 biopsied (adenomatous polyp with high grade glandular dysplasia    [id number] of systems complete and found to be negative unless listed above  PHYSICAL EXAM BP 170/80  Pulse 86  Ht 5' (1.524 m)  Wt 157 lb (71.215 kg)  BMI 30.66 kg/m2  SpO2 93%  General: Well developed, well nourished, anxious. Head: Eyes PERRLA, No xanthomas.   Normal cephalic and atramatic  Lungs: Clear bilaterally but diminished in the right base, no wheezes or rhonchi, Heart: HRRR S1 S2,2/6 holosystolic murmur. Pulses are 2+ & equal.            No carotid bruit. No JVD.  No abdominal bruits. No femoral bruits. Abdomen: Bowel sounds are positive, abdomen soft and non-tender without masses or                  Hernia's noted. Msk:  Back normal, normal gait. Normal strength and tone for age. Extremities: No clubbing, cyanosis or edema.  DP +1 Neuro: Alert and oriented X 3. Psych:  Good affect, responds appropriately  EKG: NSR with Poor R-wave progression and intraventricular conduction delay.  Rate of 76 bpm.  ASSESSMENT AND PLAN

## 2012-04-06 ENCOUNTER — Encounter: Payer: Self-pay | Admitting: Internal Medicine

## 2012-04-06 ENCOUNTER — Ambulatory Visit (INDEPENDENT_AMBULATORY_CARE_PROVIDER_SITE_OTHER): Payer: Medicare Other | Admitting: *Deleted

## 2012-04-06 DIAGNOSIS — J189 Pneumonia, unspecified organism: Secondary | ICD-10-CM

## 2012-04-06 DIAGNOSIS — I495 Sick sinus syndrome: Secondary | ICD-10-CM

## 2012-04-06 DIAGNOSIS — I4891 Unspecified atrial fibrillation: Secondary | ICD-10-CM

## 2012-04-06 DIAGNOSIS — I359 Nonrheumatic aortic valve disorder, unspecified: Secondary | ICD-10-CM

## 2012-04-06 DIAGNOSIS — I5033 Acute on chronic diastolic (congestive) heart failure: Principal | ICD-10-CM

## 2012-04-06 DIAGNOSIS — Z950 Presence of cardiac pacemaker: Secondary | ICD-10-CM

## 2012-04-06 LAB — PACEMAKER DEVICE OBSERVATION
AL THRESHOLD: 0.5 V
BAMS-0001: 160 {beats}/min
BATTERY VOLTAGE: 2.75 V
RV LEAD AMPLITUDE: 31.36 mv
RV LEAD THRESHOLD: 0.75 V

## 2012-04-06 LAB — BASIC METABOLIC PANEL
CO2: 26 mEq/L (ref 19–32)
Chloride: 107 mEq/L (ref 96–112)
Creatinine, Ser: 1.33 mg/dL — ABNORMAL HIGH (ref 0.50–1.10)
Potassium: 4.8 mEq/L (ref 3.5–5.1)

## 2012-04-06 LAB — CBC
MCV: 96.4 fL (ref 78.0–100.0)
Platelets: 180 10*3/uL (ref 150–400)
RBC: 3.02 MIL/uL — ABNORMAL LOW (ref 3.87–5.11)
WBC: 13.6 10*3/uL — ABNORMAL HIGH (ref 4.0–10.5)

## 2012-04-06 LAB — MRSA PCR SCREENING: MRSA by PCR: POSITIVE — AB

## 2012-04-06 MED ORDER — FUROSEMIDE 10 MG/ML IJ SOLN
40.0000 mg | Freq: Two times a day (BID) | INTRAMUSCULAR | Status: DC
  Administered 2012-04-06 (×2): 40 mg via INTRAVENOUS
  Filled 2012-04-06: qty 4

## 2012-04-06 MED ORDER — DEXTROSE 5 % IV SOLN
1.0000 g | INTRAVENOUS | Status: DC
  Administered 2012-04-06 – 2012-04-07 (×2): 1 g via INTRAVENOUS
  Filled 2012-04-06 (×3): qty 10

## 2012-04-06 MED ORDER — LOPERAMIDE HCL 2 MG PO CAPS
2.0000 mg | ORAL_CAPSULE | ORAL | Status: DC | PRN
  Administered 2012-04-07 – 2012-04-08 (×3): 2 mg via ORAL
  Filled 2012-04-06 (×3): qty 1

## 2012-04-06 MED ORDER — DEXTROSE 5 % IV SOLN
500.0000 mg | INTRAVENOUS | Status: DC
  Administered 2012-04-06 – 2012-04-07 (×2): 500 mg via INTRAVENOUS
  Filled 2012-04-06 (×3): qty 500

## 2012-04-06 NOTE — H&P (Signed)
[name] MRN: [id number] DOB/AGE: 1915/06/25 79 y.o. Primary Care Physician:Coron Rossano L, MD Admit date: 04/05/2012 Chief Complaint: CHF HPI: She saw her cardiologist yesterday and was having symptoms of PND. She was sent for testing and when the results came back she was directed to go to the emergency room. When she was seen in the emergency room it was felt that she could not be discharged home despite treatment and she was admitted from the ER. In addition to the problems with CHF it appears that she also has pneumonia. She has been coughing. She's complaining of upper respiratory infection. She has multiple medical problems as listed below. She had been on Lasix in the past but has been following her weights and has not taken Lasix recently  Past Medical History  Diagnosis Date  . Sick sinus syndrome 11/2006    paroxysmal atrial fibrillation; dual-chamber pacemaker in 11/2006  . Aortic stenosis     mild  . Left bundle branch block   . Congestive heart failure     Congestive heart failure with normal ejection fraction  . Arteriosclerotic cardiovascular disease (ASCVD)     coronary angio in 10/09:50% left anterior descending; 80% PDA; medical therapy advisedOrthostatic decrease in blood pressure  . Epistaxis     mild with negative ENT evaluation  . Dizziness     chronic  . Osteoarthritis   . Irritable bowel syndrome   . Urinary frequency     with incontinence  . Gastroesophageal reflux disease     Esophageal dilatation for stricture in 09/2011  . Rosacea   . Chronic lymphocytic leukemia     Stage I with anemia H./H. of 9/27 in 11/09 with normal MCV; iron deficiency in bone marrow  . Orthostatic hypotension   . Chronic anticoagulation   . Diverticulosis   . 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 Chronic kidney disease      Creatinine of 1.7 in 8/08, 1.4 in 10/08 and 1.5 in 11/09   Past  Surgical History  Procedure Date  . Cholecystectomy   . Cervical spine surgery   . Lumbar disc surgery   . Bladder suspension   . Abdominal hysterectomy 1972  . Cataract extraction     Bilateral  . Esophagogastroduodenoscopy 01/09/09    Rourk -noncritical Schatzki's ring, small hiatal hernia, fundal gland polyps, bile-stained gastric mucosa, polypoid lesion straddling D1 and D2 1-2 cm, localized lymphangitic-appearing mucosa at D2 biopsied (adenomatous polyp with high grade glandular dysplasia  . A-v cardiac pacemaker insertion 11/2006    [employer]        Family History  Problem Relation Age of Onset  . Heart failure Father   . Cancer Father   . Diabetes Mother     Social History:  reports that she has never smoked. She has never used smokeless tobacco. She reports that she does not drink alcohol or use illicit drugs.   Allergies:  Allergies  Allergen Reactions  . Detrol (Tolterodine Tartrate) Other (See Comments)    Patient states: "I was told to never take it again, I do not recall the reaction"  . [place] (Trospium Chloride) Nausea Only and Other (See Comments)    Intense lower abd pain  . Sulfa Antibiotics Other (See Comments)    Extreme dizziness  . Sulfonamide Derivatives Other (See Comments)    REACTION: GI distress    Medications Prior to Admission  Medication Sig Dispense  Refill  . acetaminophen (TYLENOL) 500 MG tablet Take 1,000 mg by mouth 4 (four) times daily.        . calcium-vitamin D (OSCAL WITH D) 500-200 MG-UNIT per tablet Take 1 tablet by mouth every morning.       . furosemide (LASIX) 20 MG tablet Take 20 mg by mouth daily.      [name] lisinopril (PRINIVIL,ZESTRIL) 20 MG tablet Take 20 mg by mouth every morning.      . metoprolol tartrate (LOPRESSOR) 25 MG tablet Take 1 tablet (25 mg total) by mouth 2 (two) times daily.  180 tablet  3  . Multiple Vitamins-Minerals (MULTIVITAMIN WITH MINERALS) tablet Take 1 tablet by mouth every morning.       .  nitroGLYCERIN (NITROSTAT) 0.4 MG SL tablet Place 1 tablet (0.4 mg total) under the tongue every 5 (five) minutes as needed for chest pain.  25 tablet  11  . nystatin (MYCOSTATIN) powder Apply topically 4 (four) times daily.  30 g  2  . potassium chloride SA (K-DUR,KLOR-CON) 20 MEQ tablet Take 20 mEq by mouth daily.      . pravastatin (PRAVACHOL) 40 MG tablet Take 40 mg by mouth at bedtime.       . verapamil (CALAN-SR) 180 MG CR tablet Take 1 tablet (180 mg total) by mouth at bedtime.  90 tablet  3  . warfarin (COUMADIN) 2.5 MG tablet Take 1.25-2.5 mg by mouth every evening. 2.5 mg daily except she takes 1/2 tablet on thursdays           NWG:NFAOZ from the symptoms mentioned above,there are no other symptoms referable to all systems reviewed.  Physical Exam: Blood pressure 115/66, pulse 86, temperature 97.8 F (36.6 C), temperature source Oral, resp. rate 16, height 5' (1.524 m), weight 70.3 kg (154 lb 15.7 oz), SpO2 92.00%. She is awake and alert. Her pupils are reactive. Her nose and throat are clear. When she's sitting upright she does not have significant JVD. Her neck is supple without masses. Her chest shows rhonchi bilaterally some rales in the bases bilaterally. Her heart is mildly irregular with a loud systolic heart murmur. Her abdomen is soft without masses. She has trace peripheral edema. Central nervous system examination is grossly intact    Basename 04/06/12 0340 04/05/12 1919  WBC 13.6* 15.3*  NEUTROABS -- 6.4  HGB 9.4* 10.2*  HCT 29.1* 31.4*  MCV 96.4 95.7  PLT 180 191    Basename 04/06/12 0340 04/05/12 1919  NA 141 138  K 4.8 4.6  CL 107 104  CO2 26 24  GLUCOSE 105* 121*  BUN 33* 32*  CREATININE 1.33* 1.21*  CALCIUM 9.2 9.6  MG -- --  lablast2(ast:2,ALT:2,alkphos:2,bilitot:2,prot:2,albumin:2)@    No results found for this or any previous visit (from the past 240 hour(s)).   Dg Chest 2 View  04/05/2012  *RADIOLOGY REPORT*  Clinical Data: Shortness of  breath  CHEST - 2 VIEW  Comparison: 09/13/2011  Findings: The pacing device is again seen.  Cardiac shadow is stable in appearance.  Increasing infiltrate is noted in the right lung base with associated small effusion.  This is new from prior exam.  IMPRESSION: New right basilar infiltrate.   Original Report Authenticated By: Alcide Clever, M.D.    Impression: She is admitted with CHF but this is complicated by the fact that she has pneumonia by chest x-ray. She has multiple other medical problems. She's going to be treated with diuresis and IV antibiotics she will  have cardiology consultation Active Problems:  * No active hospital problems. *      Plan: As above      Detrick Dani L Pager [phone]  04/06/2012, 9:09 AM

## 2012-04-06 NOTE — Progress Notes (Signed)
Pt was not given HS tylenol because it would have increased her daily tylenol dose to >4g. I talked to [place] with pharmacy at [facility] for advice and they suggested she get 500mg  four times a day and continue her PRN order, but they recommend a dose < 3g of total tylenol per day for a 79 year old patient.

## 2012-04-06 NOTE — Care Management Note (Signed)
    Page 1 of 2   04/08/2012     11:56:33 AM   CARE MANAGEMENT NOTE 04/08/2012  Patient:  [name],[name]   Account Number:  [id number]  Date Initiated:  04/06/2012  Documentation initiated by:  Sharrie Rothman  Subjective/Objective Assessment:   Pt admitted from home with CHF and pneumonia. Pt lives alone and has a son that lives close by and is very active in the care of the pt. Pt has a cane and walker for home use. Pt is fairly independent with ADL'[name].     Action/Plan:   Pt may benefit from [facility] for CHf at discharge. Also discussed life line button for pt.   Anticipated DC Date:  04/09/2012   Anticipated DC Plan:  HOME W HOME HEALTH SERVICES      DC Planning Services  CM consult      [facility] Choice  HOME HEALTH   Choice offered to / List presented to:  C-1 Patient        HH arranged  HH-1 RN      [facility] agency  [facility]   Status of service:  Completed, signed off Medicare Important Message given?  YES (If response is "NO", the following Medicare IM given date fields will be blank) Date Medicare IM given:  04/08/2012 Date Additional Medicare IM given:    Discharge Disposition:  HOME W HOME HEALTH SERVICES  Per UR Regulation:    If discussed at Long Length of Stay Meetings, dates discussed:    Comments:  04/08/12 1155 [name], RN BSN CM Pt to be discharged this weekend. Pt chose [facility] for HH. Alroy Bailiff of [facility] is aware and will collect the pts information from the chart. Weekend staff will fax orders and call [facility] when pt is discharged.  04/06/12 1116 [name], RN BSN CM

## 2012-04-06 NOTE — Progress Notes (Signed)
UR Chart Review Completed  

## 2012-04-06 NOTE — Progress Notes (Signed)
PPM check-inpatient

## 2012-04-06 NOTE — Consult Note (Signed)
CARDIOLOGY CONSULT NOTE    Patient ID: [name] MRN: [id number] DOB/AGE: September 23, 1915 79 y.o.  Admit date: 04/05/2012 Referring Physician: Kari Baars Primary Herb Grays, MD Primary Cardiologist: Trenton Bing MD Reason for Consultation: CHF, chest pain  HPI: [name] is a 79 y/o patient we saw in the office yesterday with complaints of recurrent chest discomfort and increased DOE, PND with pain with deep inspiration on the right lower back. She was sent for labs and CXR which were found to be abnormal for elevated Pro-BNP 1475, CXR with new right basilar infiltrate. Due to symptoms and evidence of mild CHF vs pneumonia, she was advised to be seen in ER. She has been admitted for pneumonia. She is now placed on  IV lasix 40 mg BID, and azithromycin IV.   She has significant cardiac history to include SSS s/p pacemaker, AoV stenosis, history of CHF with normal EF of 65%; CAD with medical management,PAF on chronic coumadin,  and multiple medical issues as listed below.     Review of systems complete and found to be negative unless listed above   Past Medical History  Diagnosis Date  . Sick sinus syndrome 11/2006    paroxysmal atrial fibrillation; dual-chamber pacemaker in 11/2006  . Aortic stenosis     mild  . Left bundle branch block   . Congestive heart failure     Congestive heart failure with normal ejection fraction  . Arteriosclerotic cardiovascular disease (ASCVD)     coronary angio in 10/09:50% left anterior descending; 80% PDA; medical therapy advisedOrthostatic decrease in blood pressure  . Epistaxis     mild with negative ENT evaluation  . Dizziness     chronic  . Osteoarthritis   . Irritable bowel syndrome   . Urinary frequency     with incontinence  . Gastroesophageal reflux disease     Esophageal dilatation for stricture in 09/2011  . Rosacea   . Chronic lymphocytic leukemia     Stage I with anemia H./H. of 9/27 in 11/09 with normal MCV;  iron deficiency in bone marrow  . Orthostatic hypotension   . Chronic anticoagulation   . Diverticulosis   . 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 Chronic kidney disease      Creatinine of 1.7 in 8/08, 1.4 in 10/08 and 1.5 in 11/09    Family History  Problem Relation Age of Onset  . Heart failure Father   . Cancer Father   . Diabetes Mother     History   Social History  . Marital Status: Widowed    Spouse Name: N/A    Number of Children: N/A  . Years of Education: N/A   Occupational History  . retired    Social History Main Topics  . Smoking status: Never Smoker   . Smokeless tobacco: Never Used  . Alcohol Use: No  . Drug Use: No  . Sexually Active: No   Other Topics Concern  . Not on file   Social History Narrative  . No narrative on file    Past Surgical History  Procedure Date  . Cholecystectomy   . Cervical spine surgery   . Lumbar disc surgery   . Bladder suspension   . Abdominal hysterectomy 1972  . Cataract extraction     Bilateral  . Esophagogastroduodenoscopy 01/09/09    Rourk -noncritical Schatzki's ring, small hiatal hernia, fundal gland polyps, bile-stained gastric mucosa,  polypoid lesion straddling D1 and D2 1-2 cm, localized lymphangitic-appearing mucosa at D2 biopsied (adenomatous polyp with high grade glandular dysplasia  . A-v cardiac pacemaker insertion 11/2006    [employer]     Prescriptions prior to admission  Medication Sig Dispense Refill  . acetaminophen (TYLENOL) 500 MG tablet Take 1,000 mg by mouth 4 (four) times daily.        . calcium-vitamin D (OSCAL WITH D) 500-200 MG-UNIT per tablet Take 1 tablet by mouth every morning.       . furosemide (LASIX) 20 MG tablet Take 20 mg by mouth daily.      [name] lisinopril (PRINIVIL,ZESTRIL) 20 MG tablet Take 20 mg by mouth every morning.      . metoprolol tartrate (LOPRESSOR) 25 MG tablet Take 1 tablet (25 mg total)  by mouth 2 (two) times daily.  180 tablet  3  . Multiple Vitamins-Minerals (MULTIVITAMIN WITH MINERALS) tablet Take 1 tablet by mouth every morning.       . nitroGLYCERIN (NITROSTAT) 0.4 MG SL tablet Place 1 tablet (0.4 mg total) under the tongue every 5 (five) minutes as needed for chest pain.  25 tablet  11  . nystatin (MYCOSTATIN) powder Apply topically 4 (four) times daily.  30 g  2  . potassium chloride SA (K-DUR,KLOR-CON) 20 MEQ tablet Take 20 mEq by mouth daily.      . pravastatin (PRAVACHOL) 40 MG tablet Take 40 mg by mouth at bedtime.       . verapamil (CALAN-SR) 180 MG CR tablet Take 1 tablet (180 mg total) by mouth at bedtime.  90 tablet  3  . warfarin (COUMADIN) 2.5 MG tablet Take 1.25-2.5 mg by mouth every evening. 2.5 mg daily except she takes 1/2 tablet on thursdays       Echocardiogram:09/15/2011 Left ventricle: The cavity size was normal. There was mild focal basal hypertrophy of the septum. Systolic function was vigorous. The estimated ejection fraction was in the range of 65% to 70%. Wall motion was normal; there were no regional wall motion abnormalities. Doppler parameters are consistent with abnormal left ventricular relaxation (grade 1 diastolic dysfunction). - Ventricular septum: Septal motion showed paradox. - Aortic valve: Mildly to moderately calcified annulus. Probably trileaflet; moderately calcified leaflets. Cusp separation was reduced. There was moderate stenosis. Mild regurgitation. Mean gradient: 26mm Hg (S). Valve area: 1.16cm^2 (Vmax). - Mitral valve: Calcified annulus. Mild regurgitation. - Left atrium: The atrium was moderately dilated. - Right ventricle: Pacer wire or catheter noted in right ventricle. - Tricuspid valve: Mild regurgitation. Peak gradient: 39mm Hg (D). - Pulmonary arteries: Systolic pressure was mildly increased. - Pericardium, extracardiac: There was no pericardial effusion.   Physical Exam: Blood pressure 115/66, pulse 86,  temperature 97.8 F (36.6 C), temperature source Oral, resp. rate 16, height 5' (1.524 m), weight 154 lb 15.7 oz (70.3 kg), SpO2 92.00%.Wt loss since admission 3 lbs.  General: No acute distress Head: Eyes PERRLA, No xanthomas.   Normal cephalic and atramatic  Lungs: Clear in the upper lobes but crackles in the bases, with mild expiratory wheeze on the right base. Heart: HRRR S1 S2 2/6 systolic murmur.  Pulses are 2+ & equal.            No carotid bruit. No JVD. Abdomen: Bowel sounds are positive, abdomen soft and non-tender without masses. Msk:  Back normal, normal gait. Normal strength and tone for age. Extremities: No clubbing, cyanosis or edema.  DP +1 Neuro: Alert and oriented X 3. Psych:  Good affect, responds appropriately  Labs:   Lab Results  Component Value Date   WBC 13.6* 04/06/2012   HGB 9.4* 04/06/2012   HCT 29.1* 04/06/2012   MCV 96.4 04/06/2012   PLT 180 04/06/2012     Lab 04/06/12 0340 04/05/12 1919  NA 141 --  K 4.8 --  CL 107 --  CO2 26 --  BUN 33* --  CREATININE 1.33* --  CALCIUM 9.2 --  PROT -- 7.1  BILITOT -- 0.3  ALKPHOS -- 104  ALT -- 20  AST -- 23  GLUCOSE 105* --   Lab Results  Component Value Date   CKTOTAL 37 12/14/2007   CKMB 1.6 12/14/2007   TROPONINI <0.30 04/05/2012     Radiology: Dg Chest 2 View  04/05/2012  *RADIOLOGY REPORT*  Clinical Data: Shortness of breath  CHEST - 2 VIEW  Comparison: 09/13/2011  Findings: The pacing device is again seen.  Cardiac shadow is stable in appearance.  Increasing infiltrate is noted in the right lung base with associated small effusion.  This is new from prior exam.  IMPRESSION: New right basilar infiltrate.   Original Report Authenticated By: Alcide Clever, M.D.    WGN:FAOZH rhythm  ASSESSMENT AND PLAN:   1. Acute on chronic diastolic CHF: Mild CHF per Pro- BNP with diureses of 3 lbs since admission. She on IV lasix 40 mg BID and is breathing some better. Had not been taking lasix at home due to wt  parameters outlined by Dr.Rothbart. May now have new lower dry wt. Will change to PO lasix in am.   2. Pneumonia: Currently on IV antibiotics per Dr.Hawkins with improvement in symptoms.  3. AoV stenosis: Do not think this is contributing to current CHF status. She is not a candidate for AoV replacement secondary to age and co-morbidities. Continue afterload reduction with with lisinopril.  4. Atrial fibrillation: Remains on coumadin per pharmacy with close monitoring on abx. She is rate controlled on verapamil and metoprolol.  5. CAD: Medical and risk management only.   Signed: Bettey Mare. Lyman Bishop NP [facility] 04/06/2012, 11:38 AM Co-Sign MD   Attending note:  Patient seen and examined. Reviewed records and modified above noted by Ms. Lawrence NP. She is admitted now with evidence of pneumonia by chest x-ray, possibly also mild component of diastolic heart failure. Today she already feels better, placed on antibiotics, and is on IV Lasix with good diuresis and weight change. On examination she is comfortable, vital signs stable with adequate heart rate control in atrial fibrillation. She has rhonchi and crackles at the right base, no wheezing. INR 2.2, proBNP 1598, troponin I negative, BUN 33, creatinine 1.3. Continue diuresis, possibly convert to oral tomorrow with discharge soon. Otherwise per Dr. Juanetta Gosling for management of pneumonia.  Jonelle Sidle, M.D., F.A.C.C.

## 2012-04-07 DIAGNOSIS — I509 Heart failure, unspecified: Secondary | ICD-10-CM

## 2012-04-07 LAB — BASIC METABOLIC PANEL
BUN: 43 mg/dL — ABNORMAL HIGH (ref 6–23)
CO2: 28 mEq/L (ref 19–32)
Calcium: 9.2 mg/dL (ref 8.4–10.5)
Creatinine, Ser: 1.64 mg/dL — ABNORMAL HIGH (ref 0.50–1.10)
GFR calc non Af Amer: 26 mL/min — ABNORMAL LOW (ref >90)
Glucose, Bld: 108 mg/dL — ABNORMAL HIGH (ref 70–99)

## 2012-04-07 MED ORDER — FUROSEMIDE 20 MG PO TABS
20.0000 mg | ORAL_TABLET | Freq: Every day | ORAL | Status: DC
  Administered 2012-04-07 – 2012-04-09 (×3): 20 mg via ORAL
  Filled 2012-04-07 (×3): qty 1

## 2012-04-07 NOTE — Progress Notes (Signed)
Patient given loperamide at 1242 for loose stool and repeated at 1334 for 2 more loose stools.

## 2012-04-07 NOTE — Progress Notes (Signed)
Patient ID: [name], female   DOB: 03-12-1916, 79 y.o.   MRN: [id number]    Subjective:  Denies SSCP, palpitations or Dyspnea Breathing better  Objective:  Filed Vitals:   04/06/12 0418 04/06/12 1339 04/06/12 2047 04/07/12 0545  BP: 115/66 125/49 136/41 116/61  Pulse: 86 75 81 68  Temp: 97.8 F (36.6 C) 97.8 F (36.6 C) 97.8 F (36.6 C) 98.2 F (36.8 C)  TempSrc: Oral  Oral Oral  Resp: 16 16 18 16   Height:      Weight:      SpO2: 92% 95% 93% 99%    Intake/Output from previous day:  Intake/Output Summary (Last 24 hours) at 04/07/12 0834 Last data filed at 04/07/12 0300  Gross per 24 hour  Intake    487 ml  Output   2000 ml  Net  -1513 ml    Physical Exam: Affect appropriate Healthy:  appears stated age HEENT: normal Neck supple with no adenopathy JVP normal no bruits no thyromegaly Lungs clear with no wheezing and good diaphragmatic motion Heart:  S1/S2 moderate AS  murmur, no rub, gallop or click PMI normal Abdomen: benighn, BS positve, no tenderness, no AAA no bruit.  No HSM or HJR Distal pulses intact with no bruits No edema Neuro non-focal Skin warm and dry No muscular weakness   Lab Results: Basic Metabolic Panel:  Basename 04/07/12 0336 04/06/12 0340  NA 140 141  K 4.5 4.8  CL 104 107  CO2 28 26  GLUCOSE 108* 105*  BUN 43* 33*  CREATININE 1.64* 1.33*  CALCIUM 9.2 9.2  MG -- --  PHOS -- --   Liver Function Tests:  [facility] 04/05/12 1919 04/05/12 1550  AST 23 23  ALT 20 19  ALKPHOS 104 101  BILITOT 0.3 0.2*  PROT 7.1 6.9  ALBUMIN 3.9 3.6   CBC:  Basename 04/06/12 0340 04/05/12 1919  WBC 13.6* 15.3*  NEUTROABS -- 6.4  HGB 9.4* 10.2*  HCT 29.1* 31.4*  MCV 96.4 95.7  PLT 180 191   Cardiac Enzymes:  Basename 04/05/12 1919  CKTOTAL --  CKMB --  CKMBINDEX --  TROPONINI <0.30  D-Dimer:  Basename 04/05/12 1550  DDIMER 0.57*    Imaging: Dg Chest 2 View  04/05/2012  *RADIOLOGY REPORT*  Clinical Data: Shortness of  breath  CHEST - 2 VIEW  Comparison: 09/13/2011  Findings: The pacing device is again seen.  Cardiac shadow is stable in appearance.  Increasing infiltrate is noted in the right lung base with associated small effusion.  This is new from prior exam.  IMPRESSION: New right basilar infiltrate.   Original Report Authenticated By: Alcide Clever, M.D.     Cardiac Studies:  ECG: 1/22 P synch pacing rate 96   Telemetry: NSR no arrhythmia 04/07/2012   Echo:   09/15/11  Moderate AS normal EF  Study Conclusions  - Left ventricle: The cavity size was normal. There was mild focal basal hypertrophy of the septum. Systolic function was vigorous. The estimated ejection fraction was in the range of 65% to 70%. Wall motion was normal; there were no regional wall motion abnormalities. Doppler parameters are consistent with abnormal left ventricular relaxation (grade 1 diastolic dysfunction). - Ventricular septum: Septal motion showed paradox. - Aortic valve: Mildly to moderately calcified annulus. Probably trileaflet; moderately calcified leaflets. Cusp separation was reduced. There was moderate stenosis. Mild regurgitation. Mean gradient: 26mm Hg (S). Valve area: 1.16cm^2 (Vmax). - Mitral valve: Calcified annulus. Mild regurgitation. - Left atrium: The  atrium was moderately dilated. - Right ventricle: Pacer wire or catheter noted in right ventricle. - Tricuspid valve: Mild regurgitation. Peak gradient: 39mm Hg (D). - Pulmonary arteries: Systolic pressure was mildly increased. - Pericardium, extracardiac: There was no pericardial effusion.    Medications:     . acetaminophen  1,000 mg Oral QID  . atorvastatin  10 mg Oral q1800  . azithromycin  500 mg Intravenous Q24H  . calcium-vitamin D  1 tablet Oral q morning - 10a  . cefTRIAXone (ROCEPHIN)  IV  1 g Intravenous Q24H  . furosemide  20 mg Oral Daily  . lisinopril  20 mg Oral q morning - 10a  . metoprolol tartrate  25 mg Oral BID  .  multivitamin with minerals  1 tablet Oral q morning - 10a  . potassium chloride SA  20 mEq Oral Daily  . sodium chloride  3 mL Intravenous Q12H  . verapamil  180 mg Oral QHS  . warfarin  1.25 mg Oral Q Thu-1800  . warfarin  2.5 mg Oral Custom  . Warfarin - Physician Dosing Inpatient   Does not apply q1800       Assessment/Plan:  Diastolic CHF:  Resolved back on home oral dose of lasix and K AS:  Moderate would f/u echo in [place] Despite age she may be a TAVR candidate in future if CHF is a recurrent problem Pacer: normal function by interogation  Will sign off  Charlton Haws 04/07/2012, 8:34 AM

## 2012-04-07 NOTE — Progress Notes (Signed)
Subjective: She says she feels better. She still has some chest discomfort when she takes a deep breath. She has no other new complaints. She did not have any PND or orthopnea last night  Objective: Vital signs in last 24 hours: Temp:  [97.8 F (36.6 C)-98.2 F (36.8 C)] 98.2 F (36.8 C) (01/23 0545) Pulse Rate:  [68-81] 68  (01/23 0545) Resp:  [16-18] 16  (01/23 0545) BP: (116-136)/(41-61) 116/61 mmHg (01/23 0545) SpO2:  [93 %-99 %] 99 % (01/23 0545) Weight change:  Last BM Date: 04/06/12  Intake/Output from previous day: 01/22 0701 - 01/23 0700 In: 847 [P.O.:840; I.V.:3; IV Piggyback:4] Out: 2000 [Urine:2000]  PHYSICAL EXAM General appearance: alert, cooperative and mild distress Resp: rhonchi bilaterally Cardio: irregularly irregular rhythm GI: soft, non-tender; bowel sounds normal; no masses,  no organomegaly Extremities: extremities normal, atraumatic, no cyanosis or edema  Lab Results:    Basic Metabolic Panel:  Basename 04/07/12 0336 04/06/12 0340  NA 140 141  K 4.5 4.8  CL 104 107  CO2 28 26  GLUCOSE 108* 105*  BUN 43* 33*  CREATININE 1.64* 1.33*  CALCIUM 9.2 9.2  MG -- --  PHOS -- --   Liver Function Tests:  Basename 04/05/12 1919 04/05/12 1550  AST 23 23  ALT 20 19  ALKPHOS 104 101  BILITOT 0.3 0.2*  PROT 7.1 6.9  ALBUMIN 3.9 3.6   No results found for this basename: LIPASE:2,AMYLASE:2 in the last 72 hours No results found for this basename: AMMONIA:2 in the last 72 hours CBC:  Basename 04/06/12 0340 04/05/12 1919  WBC 13.6* 15.3*  NEUTROABS -- 6.4  HGB 9.4* 10.2*  HCT 29.1* 31.4*  MCV 96.4 95.7  PLT 180 191   Cardiac Enzymes:  Basename 04/05/12 1919  CKTOTAL --  CKMB --  CKMBINDEX --  TROPONINI <0.30   BNP:  Basename 04/05/12 1919 04/05/12 0350  PROBNP 1598.0* 1475.0*   D-Dimer:  Alvira Philips 04/05/12 1550  DDIMER 0.57*   CBG: No results found for this basename: GLUCAP:6 in the last 72 hours Hemoglobin A1C: No results  found for this basename: HGBA1C in the last 72 hours Fasting Lipid Panel: No results found for this basename: CHOL,HDL,LDLCALC,TRIG,CHOLHDL,LDLDIRECT in the last 72 hours Thyroid Function Tests: No results found for this basename: TSH,T4TOTAL,FREET4,T3FREE,THYROIDAB in the last 72 hours Anemia Panel: No results found for this basename: VITAMINB12,FOLATE,FERRITIN,TIBC,IRON,RETICCTPCT in the last 72 hours Coagulation:  Basename 04/07/12 0336 04/06/12 0340  LABPROT 23.8* 23.8*  INR 2.24* 2.24*   Urine Drug Screen: Drugs of Abuse  No results found for this basename: labopia, cocainscrnur, labbenz, amphetmu, thcu, labbarb    Alcohol Level: No results found for this basename: ETH:2 in the last 72 hours Urinalysis: No results found for this basename: COLORURINE:2,APPERANCEUR:2,LABSPEC:2,PHURINE:2,GLUCOSEU:2,HGBUR:2,BILIRUBINUR:2,KETONESUR:2,PROTEINUR:2,UROBILINOGEN:2,NITRITE:2,LEUKOCYTESUR:2 in the last 72 hours Misc. Labs:  ABGS No results found for this basename: PHART,PCO2,PO2ART,TCO2,HCO3 in the last 72 hours CULTURES Recent Results (from the past 240 hour(s))  MRSA PCR SCREENING     Status: Abnormal   Collection Time   04/06/12  9:27 AM      Component Value Range Status Comment   MRSA by PCR POSITIVE (*) NEGATIVE Final    Studies/Results: Dg Chest 2 View  04/05/2012  *RADIOLOGY REPORT*  Clinical Data: Shortness of breath  CHEST - 2 VIEW  Comparison: 09/13/2011  Findings: The pacing device is again seen.  Cardiac shadow is stable in appearance.  Increasing infiltrate is noted in the right lung base with associated small effusion.  This  is new from prior exam.  IMPRESSION: New right basilar infiltrate.   Original Report Authenticated By: Alcide Clever, M.D.     Medications:  Prior to Admission:  Prescriptions prior to admission  Medication Sig Dispense Refill  . acetaminophen (TYLENOL) 500 MG tablet Take 1,000 mg by mouth 4 (four) times daily.        . calcium-vitamin D (OSCAL  WITH D) 500-200 MG-UNIT per tablet Take 1 tablet by mouth every morning.       . furosemide (LASIX) 20 MG tablet Take 20 mg by mouth daily.      [name] lisinopril (PRINIVIL,ZESTRIL) 20 MG tablet Take 20 mg by mouth every morning.      . metoprolol tartrate (LOPRESSOR) 25 MG tablet Take 1 tablet (25 mg total) by mouth 2 (two) times daily.  180 tablet  3  . Multiple Vitamins-Minerals (MULTIVITAMIN WITH MINERALS) tablet Take 1 tablet by mouth every morning.       . nitroGLYCERIN (NITROSTAT) 0.4 MG SL tablet Place 1 tablet (0.4 mg total) under the tongue every 5 (five) minutes as needed for chest pain.  25 tablet  11  . nystatin (MYCOSTATIN) powder Apply topically 4 (four) times daily.  30 g  2  . potassium chloride SA (K-DUR,KLOR-CON) 20 MEQ tablet Take 20 mEq by mouth daily.      . pravastatin (PRAVACHOL) 40 MG tablet Take 40 mg by mouth at bedtime.       . verapamil (CALAN-SR) 180 MG CR tablet Take 1 tablet (180 mg total) by mouth at bedtime.  90 tablet  3  . warfarin (COUMADIN) 2.5 MG tablet Take 1.25-2.5 mg by mouth every evening. 2.5 mg daily except she takes 1/2 tablet on thursdays       Scheduled:   . acetaminophen  1,000 mg Oral QID  . atorvastatin  10 mg Oral q1800  . azithromycin  500 mg Intravenous Q24H  . calcium-vitamin D  1 tablet Oral q morning - 10a  . cefTRIAXone (ROCEPHIN)  IV  1 g Intravenous Q24H  . furosemide  20 mg Oral Daily  . lisinopril  20 mg Oral q morning - 10a  . metoprolol tartrate  25 mg Oral BID  . multivitamin with minerals  1 tablet Oral q morning - 10a  . potassium chloride SA  20 mEq Oral Daily  . sodium chloride  3 mL Intravenous Q12H  . verapamil  180 mg Oral QHS  . warfarin  1.25 mg Oral Q Thu-1800  . warfarin  2.5 mg Oral Custom  . Warfarin - Physician Dosing Inpatient   Does not apply q1800   Continuous:  WUJ:WJXBJY chloride, acetaminophen, loperamide, nitroGLYCERIN, sodium chloride  Assesment: She was admitted with a combination of congestive heart  failure and pneumonia. She seems to be improving. Her renal function is a little bit worse probably from diuresis on going to discontinue IV Lasix and switch her to low dose by mouth. She is continuing on IV antibiotics. Her chest is clearing but not clear Active Problems:  HYPERLIPIDEMIA  HYPERTENSION  Arteriosclerotic cardiovascular disease (ASCVD)  Aortic stenosis  Congestive heart failure  Paroxysmal atrial fibrillation  Pneumonia, organism unspecified    Plan: As above    LOS: 2 days   Lyric Rossano L 04/07/2012, 8:50 AM

## 2012-04-08 LAB — CBC WITH DIFFERENTIAL/PLATELET
Eosinophils Absolute: 0.6 10*3/uL (ref 0.0–0.7)
Eosinophils Relative: 4 % (ref 0–5)
HCT: 30.6 % — ABNORMAL LOW (ref 36.0–46.0)
Lymphocytes Relative: 57 % — ABNORMAL HIGH (ref 12–46)
Lymphs Abs: 7.7 10*3/uL — ABNORMAL HIGH (ref 0.7–4.0)
MCH: 31.2 pg (ref 26.0–34.0)
MCV: 96.5 fL (ref 78.0–100.0)
Monocytes Absolute: 0.7 10*3/uL (ref 0.1–1.0)
Platelets: 183 10*3/uL (ref 150–400)
RBC: 3.17 MIL/uL — ABNORMAL LOW (ref 3.87–5.11)
RDW: 13.2 % (ref 11.5–15.5)

## 2012-04-08 LAB — BASIC METABOLIC PANEL
CO2: 29 mEq/L (ref 19–32)
Calcium: 9.2 mg/dL (ref 8.4–10.5)
Chloride: 103 mEq/L (ref 96–112)
Creatinine, Ser: 1.59 mg/dL — ABNORMAL HIGH (ref 0.50–1.10)
Glucose, Bld: 107 mg/dL — ABNORMAL HIGH (ref 70–99)

## 2012-04-08 MED ORDER — CHLORHEXIDINE GLUCONATE CLOTH 2 % EX PADS
6.0000 | MEDICATED_PAD | Freq: Every day | CUTANEOUS | Status: DC
  Administered 2012-04-08 – 2012-04-09 (×2): 6 via TOPICAL

## 2012-04-08 MED ORDER — CEFUROXIME AXETIL 250 MG PO TABS
500.0000 mg | ORAL_TABLET | Freq: Two times a day (BID) | ORAL | Status: DC
  Administered 2012-04-08 – 2012-04-09 (×2): 500 mg via ORAL
  Filled 2012-04-08 (×3): qty 2

## 2012-04-08 MED ORDER — AZITHROMYCIN 250 MG PO TABS
500.0000 mg | ORAL_TABLET | Freq: Every day | ORAL | Status: AC
  Administered 2012-04-08: 500 mg via ORAL
  Filled 2012-04-08: qty 2

## 2012-04-08 MED ORDER — BACID PO TABS
2.0000 | ORAL_TABLET | Freq: Two times a day (BID) | ORAL | Status: DC
  Filled 2012-04-08 (×6): qty 2

## 2012-04-08 MED ORDER — MUPIROCIN 2 % EX OINT
1.0000 "application " | TOPICAL_OINTMENT | Freq: Two times a day (BID) | CUTANEOUS | Status: DC
  Administered 2012-04-08 – 2012-04-09 (×3): 1 via NASAL
  Filled 2012-04-08: qty 22

## 2012-04-08 MED ORDER — AZITHROMYCIN 250 MG PO TABS
250.0000 mg | ORAL_TABLET | Freq: Every day | ORAL | Status: DC
  Administered 2012-04-09: 250 mg via ORAL
  Filled 2012-04-08: qty 1

## 2012-04-08 MED ORDER — LOPERAMIDE HCL 2 MG PO CAPS
2.0000 mg | ORAL_CAPSULE | Freq: Two times a day (BID) | ORAL | Status: DC
  Administered 2012-04-09 (×2): 2 mg via ORAL
  Filled 2012-04-08 (×2): qty 1

## 2012-04-08 NOTE — Progress Notes (Signed)
Reason for visit ; diarrhea.  History of present illness; [name] is well known to me from previous evaluation. [name] is  79 year old Caucasian female (who lives alone and is fairly independent) who was admitted to Dr. Juanetta Gosling service on 04/05/2012 with pneumonia and CHF and feels a lot better with treatment and looking forward to going home. Dr. Juanetta Gosling asked me to see [name] while she is in the hospital since she is still having diarrhea. She has history of IBS. She has undergone extensive evaluation in the past. Sigmoid colon biopsies were negative for collagenous or microscopic colitis and she's been doing well with OTC loperamide. On day of admission she had one accident. She is having an average of 4 stools per day. She remains with urgency. She denies rectal bleeding or melena. She has abdominal discomfort usually for during defecation. She says her appetite is good. She's been able to swallow well since her last EGD ED of July 2013. She has history of esophageal ring. 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 She had EGD with the snare polypectomy in June and August 2009 by Dr. Jena Gauss. Colonic diverticulosis. Last colonoscopy was in October 2007 with removal of small adenoma. History of diarrhea pancreatitis. Status post sphincterotomy in April 2003. Hypertension. History of CHF.  proximal atrial fibrillation; she is chronically anticoagulated, GERD. Presently doing well off therapy.  hyperlipidemia. Aortic stenosis. She had pacemaker placed in September 2008. She has urinary incontinence. She also has CLL. Prior surgeries include hysterectomy, cholecystectomy and neck surgery for disc disease.  Objective; BP 143/55  Pulse 87  Temp 97.6 F (36.4 C) (Oral)  Resp 18  Ht 5' (1.524 m)  Wt 154 lb 15.7 oz (70.3 kg)  BMI 30.27 kg/m2  SpO2 99% Conjunctiva was somewhat pale. Sclerae is nonicteric. No neck masses or thyromegaly  noted. Cardiac exam with regular rhythm normal S1 and S2 and loud systolic ejection murmur heard all over the precordium but most pronounced at aortic area. Auscultation of lungs revealed visit for her breast sounds bilaterally. Abdomen is full. Bowel sounds are hyperactive. Abdomen is soft with mild tenderness at RLQ and LLQ but no guarding noted. No organomegaly or masses. Lab data reviewed; CBC from admission. WBC 15.3, H&H 10.2 and 31.4 and platelet count 191K. Chemistry panel from admission pertinent for glucose of 121, BUN 32 and creatinine 1.21. H&H on this morning is 10.9 and 30.6.  Assessment; [name] has following GI issues. #1. Diarrhea. This has been chronic symptom and it does not appear that severity has changed. He received Augmentin for one week earlier this month and now on 2 antibiotics and therefore at risk for C. difficile colitis. She does not appear to be acutely ill and therefore I doubt that she has C. difficile ova like to document by doing stool test. She has never been constipated. She should take loperamide on schedule rather than when necessary. All other options would be risk prone. #2. Anemia. She appears to have anemia of chronic disease. There is an insignificant drop in her H&H with acute illness. Anemia possibly related to CLL. No evidence of GI bleed. #3. History of dysphagia. She had distal esophageal ring dilated in July 2013 and is able to swallow much better. She also has esophageal motility disorder given that she had esophageal diverticula but mid esophagus. No further workup indicated at this time. #4. Duodenal adenoma. Previous attempts at endoscopic removal not success. She had planned visit in September 2013  along with her son Leonette Most to review options. She had to cancel with it on account of numbness involving her daughter-in-law. This issue can be revisited on her followup visit.  Recommendations; Stool for C. difficile by PCR. Lactinex 2 tablets by  mouth twice a day. Change loperamide scheduled to 2 mg by mouth before breakfast and lunch. Will see her in the office in few weeks.

## 2012-04-08 NOTE — Progress Notes (Signed)
Subjective: She says she feels better. She is having some trouble with IBS. Her breathing is improving  Objective: Vital signs in last 24 hours: Temp:  [97.2 F (36.2 C)-97.6 F (36.4 C)] 97.6 F (36.4 C) (01/24 1336) Pulse Rate:  [74-87] 87  (01/24 1336) Resp:  [18-20] 18  (01/24 1336) BP: (142-150)/(47-70) 143/55 mmHg (01/24 1336) SpO2:  [92 %-99 %] 99 % (01/24 1336) Weight change:  Last BM Date: 04/07/12  Intake/Output from previous day: 01/23 0701 - 01/24 0700 In: 840 [P.O.:840] Out: 750 [Urine:750]  PHYSICAL EXAM General appearance: alert, cooperative and no distress Resp: rhonchi bilaterally Cardio: she has a loud systolic murmur. Her heart i mildly irregular GI: soft, non-tender; bowel sounds normal; no masses,  no organomegaly Extremities: extremities normal, atraumatic, no cyanosis or edema  Lab Results:    Basic Metabolic Panel:  Basename 04/08/12 0336 04/07/12 0336  NA 139 140  K 4.4 4.5  CL 103 104  CO2 29 28  GLUCOSE 107* 108*  BUN 45* 43*  CREATININE 1.59* 1.64*  CALCIUM 9.2 9.2  MG -- --  PHOS -- --   Liver Function Tests:  Basename 04/05/12 1919 04/05/12 1550  AST 23 23  ALT 20 19  ALKPHOS 104 101  BILITOT 0.3 0.2*  PROT 7.1 6.9  ALBUMIN 3.9 3.6   No results found for this basename: LIPASE:2,AMYLASE:2 in the last 72 hours No results found for this basename: AMMONIA:2 in the last 72 hours CBC:  Basename 04/08/12 0336 04/06/12 0340 04/05/12 1919  WBC 13.7* 13.6* --  NEUTROABS 4.5 -- 6.4  HGB 9.9* 9.4* --  HCT 30.6* 29.1* --  MCV 96.5 96.4 --  PLT 183 180 --   Cardiac Enzymes:  Basename 04/05/12 1919  CKTOTAL --  CKMB --  CKMBINDEX --  TROPONINI <0.30   BNP:  Basename 04/05/12 1919  PROBNP 1598.0*   D-Dimer:  Alvira Philips 04/05/12 1550  DDIMER 0.57*   CBG: No results found for this basename: GLUCAP:6 in the last 72 hours Hemoglobin A1C: No results found for this basename: HGBA1C in the last 72 hours Fasting Lipid  Panel: No results found for this basename: CHOL,HDL,LDLCALC,TRIG,CHOLHDL,LDLDIRECT in the last 72 hours Thyroid Function Tests: No results found for this basename: TSH,T4TOTAL,FREET4,T3FREE,THYROIDAB in the last 72 hours Anemia Panel: No results found for this basename: VITAMINB12,FOLATE,FERRITIN,TIBC,IRON,RETICCTPCT in the last 72 hours Coagulation:  Basename 04/08/12 0336 04/07/12 0336  LABPROT 22.2* 23.8*  INR 2.04* 2.24*   Urine Drug Screen: Drugs of Abuse  No results found for this basename: labopia, cocainscrnur, labbenz, amphetmu, thcu, labbarb    Alcohol Level: No results found for this basename: ETH:2 in the last 72 hours Urinalysis: No results found for this basename: COLORURINE:2,APPERANCEUR:2,LABSPEC:2,PHURINE:2,GLUCOSEU:2,HGBUR:2,BILIRUBINUR:2,KETONESUR:2,PROTEINUR:2,UROBILINOGEN:2,NITRITE:2,LEUKOCYTESUR:2 in the last 72 hours Misc. Labs:  ABGS No results found for this basename: PHART,PCO2,PO2ART,TCO2,HCO3 in the last 72 hours CULTURES Recent Results (from the past 240 hour(s))  MRSA PCR SCREENING     Status: Abnormal   Collection Time   04/06/12  9:27 AM      Component Value Range Status Comment   MRSA by PCR POSITIVE (*) NEGATIVE Final    Studies/Results: No results found.  Medications:  Prior to Admission:  Prescriptions prior to admission  Medication Sig Dispense Refill  . acetaminophen (TYLENOL) 500 MG tablet Take 1,000 mg by mouth 4 (four) times daily.        . calcium-vitamin D (OSCAL WITH D) 500-200 MG-UNIT per tablet Take 1 tablet by mouth every morning.       [name]  furosemide (LASIX) 20 MG tablet Take 20 mg by mouth daily.      [name] lisinopril (PRINIVIL,ZESTRIL) 20 MG tablet Take 20 mg by mouth every morning.      . metoprolol tartrate (LOPRESSOR) 25 MG tablet Take 1 tablet (25 mg total) by mouth 2 (two) times daily.  180 tablet  3  . Multiple Vitamins-Minerals (MULTIVITAMIN WITH MINERALS) tablet Take 1 tablet by mouth every morning.       . nitroGLYCERIN  (NITROSTAT) 0.4 MG SL tablet Place 1 tablet (0.4 mg total) under the tongue every 5 (five) minutes as needed for chest pain.  25 tablet  11  . nystatin (MYCOSTATIN) powder Apply topically 4 (four) times daily.  30 g  2  . potassium chloride SA (K-DUR,KLOR-CON) 20 MEQ tablet Take 20 mEq by mouth daily.      . pravastatin (PRAVACHOL) 40 MG tablet Take 40 mg by mouth at bedtime.       . verapamil (CALAN-SR) 180 MG CR tablet Take 1 tablet (180 mg total) by mouth at bedtime.  90 tablet  3  . warfarin (COUMADIN) 2.5 MG tablet Take 1.25-2.5 mg by mouth every evening. 2.5 mg daily except she takes 1/2 tablet on thursdays       Scheduled:   . acetaminophen  1,000 mg Oral QID  . atorvastatin  10 mg Oral q1800  . azithromycin  250 mg Oral Daily  . calcium-vitamin D  1 tablet Oral q morning - 10a  . cefUROXime  500 mg Oral BID WC  . Chlorhexidine Gluconate Cloth  6 each Topical Q0600  . furosemide  20 mg Oral Daily  . lisinopril  20 mg Oral q morning - 10a  . metoprolol tartrate  25 mg Oral BID  . multivitamin with minerals  1 tablet Oral q morning - 10a  . mupirocin ointment  1 application Nasal BID  . potassium chloride SA  20 mEq Oral Daily  . verapamil  180 mg Oral QHS  . warfarin  1.25 mg Oral Q Thu-1800  . warfarin  2.5 mg Oral Custom  . Warfarin - Physician Dosing Inpatient   Does not apply q1800   Continuous:  [id number], loperamide, nitroGLYCERIN  Assesment:she has CHF exacerbation. She has pneumonia. She is generally improving Active Problems:  HYPERLIPIDEMIA  HYPERTENSION  Arteriosclerotic cardiovascular disease (ASCVD)  Aortic stenosis  Congestive heart failure  Paroxysmal atrial fibrillation  Pneumonia, organism unspecified    Plan:she will switch to p.o. meds and probably be ready for discharge tomorrow    LOS: 3 days   Bennett Ram L 04/08/2012, 3:12 PM

## 2012-04-08 NOTE — Progress Notes (Signed)
Notified Dr. Renard Matter of positive lab result ( c-diff).  Received no new orders at this time.

## 2012-04-08 NOTE — Plan of Care (Signed)
Problem: Phase II Progression Outcomes Goal: Begin discharge teaching Outcome: Completed/Met Date Met:  04/08/12 Went over CHF handout with patient.

## 2012-04-08 NOTE — Plan of Care (Signed)
Problem: Phase II Progression Outcomes Goal: Walk in hall or up in chair TID Outcome: Progressing Walked in hall approximately 100 ft. Tolerated well.

## 2012-04-09 MED ORDER — CEFUROXIME AXETIL 500 MG PO TABS: 500.0000 mg | ORAL_TABLET | Freq: Two times a day (BID) | ORAL | Status: DC

## 2012-04-09 MED ORDER — METRONIDAZOLE 250 MG PO TABS: 250.0000 mg | ORAL_TABLET | Freq: Four times a day (QID) | ORAL | Status: DC

## 2012-04-09 MED ORDER — AZITHROMYCIN 250 MG PO TABS: ORAL_TABLET | ORAL | Status: DC

## 2012-04-09 MED ORDER — RISAQUAD PO CAPS
2.0000 | ORAL_CAPSULE | Freq: Two times a day (BID) | ORAL | Status: DC
Start: 2012-04-09 — End: 2012-04-09
  Administered 2012-04-09: 2 via ORAL
  Filled 2012-04-09: qty 2

## 2012-04-09 NOTE — Progress Notes (Signed)
Dr. Juanetta Gosling approach me at the nurses station regarding this patient. She has a history of chronic diarrhea - doing better with her other medical issues and, in fact, going home today. A C. difficile PCR was checked this hospitalization was found to be positive. I was asked for recommendations. As long as she was doing well and going home today, she could take Flagyl 500 mg 3 times a day or 250 mg 4 times daily for 10 days as long as no allergies/intolerances or drug interactions. In addition, I recommended Align or other probiotic x2 months. I'll send this note to Dr. Karilyn Cota, her primary gastroenterologist.

## 2012-04-09 NOTE — Progress Notes (Signed)
She feels well. She has no new complaints. The consultation from Dr. Karilyn Cota  is noted and appreciated. She does have C. difficile but says her diarrhea is actually better and she has no abdominal pain. She wants to go home I discussed her situation with the on-call gastroenterologist and he suggested that since she is stable that she could be treated with by mouth Flagyl

## 2012-04-09 NOTE — Progress Notes (Signed)
Patient received discharge instructions along with follow up appointments and prescriptions. Patient verbalized understanding of all instructions. Patient was escorted by staff via wheelchair to vehicle. Patient discharged to home in stable condition. 

## 2012-04-11 ENCOUNTER — Ambulatory Visit (INDEPENDENT_AMBULATORY_CARE_PROVIDER_SITE_OTHER): Payer: Medicare Other | Admitting: *Deleted

## 2012-04-11 ENCOUNTER — Telehealth: Payer: Self-pay | Admitting: Cardiology

## 2012-04-11 DIAGNOSIS — Z7901 Long term (current) use of anticoagulants: Secondary | ICD-10-CM

## 2012-04-11 NOTE — Telephone Encounter (Signed)
INR 1.8

## 2012-04-11 NOTE — Telephone Encounter (Signed)
See coumadin note. 

## 2012-04-12 ENCOUNTER — Ambulatory Visit: Payer: Medicare Other | Admitting: Adult Health

## 2012-04-12 ENCOUNTER — Encounter: Payer: Self-pay | Admitting: Adult Health

## 2012-04-12 ENCOUNTER — Ambulatory Visit (INDEPENDENT_AMBULATORY_CARE_PROVIDER_SITE_OTHER): Payer: Medicare Other | Admitting: Adult Health

## 2012-04-12 VITALS — BP 139/63 | HR 79 | Ht 60.0 in | Wt 152.8 lb

## 2012-04-12 DIAGNOSIS — I200 Unstable angina: Secondary | ICD-10-CM

## 2012-04-12 DIAGNOSIS — I060 Rheumatic aortic stenosis: Secondary | ICD-10-CM

## 2012-04-12 DIAGNOSIS — I509 Heart failure, unspecified: Secondary | ICD-10-CM

## 2012-04-12 DIAGNOSIS — I359 Nonrheumatic aortic valve disorder, unspecified: Secondary | ICD-10-CM

## 2012-04-12 DIAGNOSIS — I350 Nonrheumatic aortic (valve) stenosis: Secondary | ICD-10-CM

## 2012-04-12 DIAGNOSIS — I2582 Chronic total occlusion of coronary artery: Secondary | ICD-10-CM

## 2012-04-12 DIAGNOSIS — I10 Essential (primary) hypertension: Secondary | ICD-10-CM

## 2012-04-12 DIAGNOSIS — I5032 Chronic diastolic (congestive) heart failure: Secondary | ICD-10-CM

## 2012-04-12 MED ORDER — OLMESARTAN MEDOXOMIL 40 MG PO TABS: 40.0000 mg | ORAL_TABLET | Freq: Every day | ORAL | Status: DC

## 2012-04-12 MED ORDER — BENZONATATE 100 MG PO CAPS: 100.0000 mg | ORAL_CAPSULE | Freq: Three times a day (TID) | ORAL | Status: DC | PRN

## 2012-04-12 NOTE — Progress Notes (Signed)
HPI: [name] is an anxious 79 y/o patient, looking younger than stated age, of Dr.Rothbart we are following for ongoing assessment and treatment after recent hospitalization for pneumonia and diastolic CHF, with known history of CAD, PAF, and SSS s/p dual chamber  Pacemaker placed in 1998. She was diuresed and placed back on daily lasix dosing. She had been taking it prn only. She comes today with recurrent tickle cough that has been getting worse over the last few days, despite two more rounds of antibiotics per her PCP. She states that cough syrup only helps temporarily but the cough returns promptly. She is maintaining wt loss from diuresis and denies any further shortness of breath. She is trying to adhere to low sodium diet.   Allergies  Allergen Reactions  . Detrol (Tolterodine Tartrate) Other (See Comments)    Patient states: "I was told to never take it again, I do not recall the reaction"  . [place] (Trospium Chloride) Nausea Only and Other (See Comments)    Intense lower abd pain  . Sulfa Antibiotics Other (See Comments)    Extreme dizziness  . Sulfonamide Derivatives Other (See Comments)    REACTION: GI distress    Current Outpatient Prescriptions  Medication Sig Dispense Refill  . acetaminophen (TYLENOL) 500 MG tablet Take 1,000 mg by mouth 4 (four) times daily.        . calcium-vitamin D (OSCAL WITH D) 500-200 MG-UNIT per tablet Take 1 tablet by mouth every morning.       . furosemide (LASIX) 20 MG tablet Take 20 mg by mouth daily.      . metoprolol tartrate (LOPRESSOR) 25 MG tablet Take 1 tablet (25 mg total) by mouth 2 (two) times daily.  180 tablet  3  . metroNIDAZOLE (FLAGYL) 250 MG tablet Take 1 tablet (250 mg total) by mouth 4 (four) times daily.  40 tablet  0  . Multiple Vitamins-Minerals (MULTIVITAMIN WITH MINERALS) tablet Take 1 tablet by mouth every morning.       . nitroGLYCERIN (NITROSTAT) 0.4 MG SL tablet Place 1 tablet (0.4 mg total) under the tongue every 5  (five) minutes as needed for chest pain.  25 tablet  11  . nystatin (MYCOSTATIN) powder Apply topically 4 (four) times daily.  30 g  2  . potassium chloride SA (K-DUR,KLOR-CON) 20 MEQ tablet Take 20 mEq by mouth daily.      . pravastatin (PRAVACHOL) 40 MG tablet Take 40 mg by mouth at bedtime.       . verapamil (CALAN-SR) 180 MG CR tablet Take 1 tablet (180 mg total) by mouth at bedtime.  90 tablet  3  . warfarin (COUMADIN) 2.5 MG tablet Take 1.25-2.5 mg by mouth every evening. 2.5 mg daily except she takes 1/2 tablet on thursdays      . benzonatate (TESSALON) 100 MG capsule Take 1 capsule (100 mg total) by mouth 3 (three) times daily as needed for cough.  30 capsule  1  . olmesartan (BENICAR) 40 MG tablet Take 1 tablet (40 mg total) by mouth daily.  45 tablet  5   No current facility-administered medications for this visit.   Facility-Administered Medications Ordered in Other Visits  Medication Dose Route Frequency Provider Last Rate Last Dose  . epoetin alfa (EPOGEN,PROCRIT) injection 40,000 Units  40,000 Units Subcutaneous Once Randall An, MD        Past Medical History  Diagnosis Date  . Sick sinus syndrome 11/2006    paroxysmal atrial  fibrillation; dual-chamber pacemaker in 11/2006  . Aortic stenosis     mild  . Left bundle branch block   . Congestive heart failure     Congestive heart failure with normal ejection fraction  . Arteriosclerotic cardiovascular disease (ASCVD)     coronary angio in 10/09:50% left anterior descending; 80% PDA; medical therapy advisedOrthostatic decrease in blood pressure  . Epistaxis     mild with negative ENT evaluation  . Dizziness     chronic  . Osteoarthritis   . Irritable bowel syndrome   . Urinary frequency     with incontinence  . Gastroesophageal reflux disease     Esophageal dilatation for stricture in 09/2011  . Rosacea   . Chronic lymphocytic leukemia     Stage I with anemia H./H. of 9/27 in 11/09 with normal MCV; iron deficiency  in bone marrow  . Orthostatic hypotension   . Chronic anticoagulation   . Diverticulosis   . 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 Chronic kidney disease      Creatinine of 1.7 in 8/08, 1.4 in 10/08 and 1.5 in 11/09    Past Surgical History  Procedure Date  . Cholecystectomy   . Cervical spine surgery   . Lumbar disc surgery   . Bladder suspension   . Abdominal hysterectomy 1972  . Cataract extraction     Bilateral  . Esophagogastroduodenoscopy 01/09/09    Rourk -noncritical Schatzki's ring, small hiatal hernia, fundal gland polyps, bile-stained gastric mucosa, polypoid lesion straddling D1 and D2 1-2 cm, localized lymphangitic-appearing mucosa at D2 biopsied (adenomatous polyp with high grade glandular dysplasia  . A-v cardiac pacemaker insertion 11/2006    [employer]    ZOX:WRUEAV of systems complete and found to be negative unless listed above  PHYSICAL EXAM BP 139/63  Pulse 79  Ht 5' (1.524 m)  Wt 152 lb 12 oz (69.287 kg)  BMI 29.83 kg/m2  SpO2 96%  General: Well developed, well nourished, in no acute distress Head: Eyes PERRLA, No xanthomas.   Normal cephalic and atramatic  Lungs: Clear bilaterally to auscultation and percussion. Heart: HRRR S1 S2,  2/6 holosystolic murmur.  Pulses are 2+ & equal.           Some radiation to the carotids. No JVD.  No abdominal bruits. No femoral bruits. Abdomen: Bowel sounds are positive, abdomen soft and non-tender without masses or                  Hernia's noted. Msk:  Back normal, normal gait. Normal strength and tone for age. Extremities: No clubbing, cyanosis or edema.  DP +1 Neuro: Alert and oriented X 3. Psych:  Good affect, responds appropriately    ASSESSMENT AND PLAN

## 2012-04-12 NOTE — Progress Notes (Deleted)
Name: [name]    DOB: Jul 22, 1915  Age: 79 y.o.  MR#: [id number]       PCP:  [name], MD      Insurance: @PAYORNAME @   CC:   PT ADVISED SHE STILL HAS DRY CONSTANT COUGH THAT WAKES HER UP AT NIGHT WITH NO RELIEF, PT DOES NOTE IMPROVEMENT IN MUCUS, NOW AS CLEAR, PRIOR NOTED AS BROWN, PT COMPLETED 2 ABT OF CEFTIN AND Z-PACK, PT CONTINUES TO TAKE FLAGYL AT THIS TIME  VS BP 139/63  Pulse 79  Ht 5' (1.524 m)  Wt 152 lb 12 oz (69.287 kg)  BMI 29.83 kg/m2  SpO2 96%  Weights Current Weight  04/12/12 152 lb 12 oz (69.287 kg)  04/05/12 154 lb 15.7 oz (70.3 kg)  04/05/12 157 lb (71.215 kg)    Blood Pressure  BP Readings from Last 3 Encounters:  04/12/12 139/63  04/09/12 112/54  04/05/12 170/80     Admit date:  (Not on file) Last encounter with RMR:  04/05/2012   Allergy Allergies  Allergen Reactions  . Detrol (Tolterodine Tartrate) Other (See Comments)    Patient states: "I was told to never take it again, I do not recall the reaction"  . [place] (Trospium Chloride) Nausea Only and Other (See Comments)    Intense lower abd pain  . Sulfa Antibiotics Other (See Comments)    Extreme dizziness  . Sulfonamide Derivatives Other (See Comments)    REACTION: GI distress    Current Outpatient Prescriptions  Medication Sig Dispense Refill  . acetaminophen (TYLENOL) 500 MG tablet Take 1,000 mg by mouth 4 (four) times daily.        . calcium-vitamin D (OSCAL WITH D) 500-200 MG-UNIT per tablet Take 1 tablet by mouth every morning.       . furosemide (LASIX) 20 MG tablet Take 20 mg by mouth daily.      [name] lisinopril (PRINIVIL,ZESTRIL) 20 MG tablet Take 20 mg by mouth every morning.      . metoprolol tartrate (LOPRESSOR) 25 MG tablet Take 1 tablet (25 mg total) by mouth 2 (two) times daily.  180 tablet  3  . metroNIDAZOLE (FLAGYL) 250 MG tablet Take 1 tablet (250 mg total) by mouth 4 (four) times daily.  40 tablet  0  . Multiple Vitamins-Minerals (MULTIVITAMIN WITH MINERALS) tablet  Take 1 tablet by mouth every morning.       . nitroGLYCERIN (NITROSTAT) 0.4 MG SL tablet Place 1 tablet (0.4 mg total) under the tongue every 5 (five) minutes as needed for chest pain.  25 tablet  11  . nystatin (MYCOSTATIN) powder Apply topically 4 (four) times daily.  30 g  2  . potassium chloride SA (K-DUR,KLOR-CON) 20 MEQ tablet Take 20 mEq by mouth daily.      . pravastatin (PRAVACHOL) 40 MG tablet Take 40 mg by mouth at bedtime.       . verapamil (CALAN-SR) 180 MG CR tablet Take 1 tablet (180 mg total) by mouth at bedtime.  90 tablet  3  . warfarin (COUMADIN) 2.5 MG tablet Take 1.25-2.5 mg by mouth every evening. 2.5 mg daily except she takes 1/2 tablet on thursdays       No current facility-administered medications for this visit.   Facility-Administered Medications Ordered in Other Visits  Medication Dose Route Frequency Provider Last Rate Last Dose  . epoetin alfa (EPOGEN,PROCRIT) injection 40,000 Units  40,000 Units Subcutaneous Once Randall An, MD  Discontinued Meds:    Medications Discontinued During This Encounter  Medication Reason  . azithromycin (ZITHROMAX) 250 MG tablet Error  . cefUROXime (CEFTIN) 500 MG tablet Error    Patient Active Problem List  Diagnosis  . HYPERLIPIDEMIA  . HYPERTENSION  . Arteriosclerotic cardiovascular disease (ASCVD)  . Chronic anticoagulation  . Sick sinus syndrome  . Aortic stenosis  . Left bundle branch block  . Congestive heart failure  . Chronic kidney disease  . Gastroesophageal reflux disease  . Chronic lymphocytic leukemia  . Transient blindness  . Paroxysmal atrial fibrillation  . Pacemaker  . Pneumonia, organism unspecified    LABS Anti-coag visit on 04/11/2012  Component Date Value  . INR 04/11/2012 1.8   Admission on 04/05/2012, Discharged on 04/09/2012  Component Date Value  . Prothrombin Time 04/05/2012 22.7*  . INR 04/05/2012 2.10*  . WBC 04/05/2012 15.3*  . RBC 04/05/2012 3.28*  . Hemoglobin  04/05/2012 10.2*  . HCT 04/05/2012 31.4*  . MCV 04/05/2012 95.7   . [facility] 04/05/2012 31.1   . MCHC 04/05/2012 32.5   . RDW 04/05/2012 13.3   . Platelets 04/05/2012 191   . Neutrophils Relative 04/05/2012 42*  . Lymphocytes Relative 04/05/2012 52*  . Monocytes Relative 04/05/2012 3   . Eosinophils Relative 04/05/2012 3   . Basophils Relative 04/05/2012 0   . Neutro Abs 04/05/2012 6.4   . Lymphs Abs 04/05/2012 7.9*  . Monocytes Absolute 04/05/2012 0.5   . Eosinophils Absolute 04/05/2012 0.5   . Basophils Absolute 04/05/2012 0.0   . WBC Morphology 04/05/2012 SMUDGE CELLS   . Sodium 04/05/2012 138   . Potassium 04/05/2012 4.6   . Chloride 04/05/2012 104   . CO2 04/05/2012 24   . Glucose, Bld 04/05/2012 121*  . BUN 04/05/2012 32*  . Creatinine, Ser 04/05/2012 1.21*  . Calcium 04/05/2012 9.6   . Total Protein 04/05/2012 7.1   . Albumin 04/05/2012 3.9   . AST 04/05/2012 23   . ALT 04/05/2012 20   . Alkaline Phosphatase 04/05/2012 104   . Total Bilirubin 04/05/2012 0.3   . GFR calc non Af Amer 04/05/2012 37*  . GFR calc Af Amer 04/05/2012 43*  . Troponin I 04/05/2012 <0.30   . Pro B Natriuretic peptid* 04/05/2012 1598.0*  . Prothrombin Time 04/06/2012 23.8*  . INR 04/06/2012 2.24*  . WBC 04/06/2012 13.6*  . RBC 04/06/2012 3.02*  . Hemoglobin 04/06/2012 9.4*  . HCT 04/06/2012 29.1*  . MCV 04/06/2012 96.4   . [facility] 04/06/2012 31.1   . MCHC 04/06/2012 32.3   . RDW 04/06/2012 13.4   . Platelets 04/06/2012 180   . Sodium 04/06/2012 141   . Potassium 04/06/2012 4.8   . Chloride 04/06/2012 107   . CO2 04/06/2012 26   . Glucose, Bld 04/06/2012 105*  . BUN 04/06/2012 33*  . Creatinine, Ser 04/06/2012 1.33*  . Calcium 04/06/2012 9.2   . GFR calc non Af Amer 04/06/2012 33*  . GFR calc Af Amer 04/06/2012 38*  . MRSA by PCR 04/06/2012 POSITIVE*  . Prothrombin Time 04/07/2012 23.8*  . INR 04/07/2012 2.24*  . Sodium 04/07/2012 140   . Potassium 04/07/2012 4.5   . Chloride  04/07/2012 104   . CO2 04/07/2012 28   . Glucose, Bld 04/07/2012 108*  . BUN 04/07/2012 43*  . Creatinine, Ser 04/07/2012 1.64*  . Calcium 04/07/2012 9.2   . GFR calc non Af Amer 04/07/2012 26*  . GFR calc Af Amer 04/07/2012 30*  .  Prothrombin Time 04/08/2012 22.2*  . INR 04/08/2012 2.04*  . Sodium 04/08/2012 139   . Potassium 04/08/2012 4.4   . Chloride 04/08/2012 103   . CO2 04/08/2012 29   . Glucose, Bld 04/08/2012 107*  . BUN 04/08/2012 45*  . Creatinine, Ser 04/08/2012 1.59*  . Calcium 04/08/2012 9.2   . GFR calc non Af Amer 04/08/2012 27*  . GFR calc Af Amer 04/08/2012 31*  . WBC 04/08/2012 13.7*  . RBC 04/08/2012 3.17*  . Hemoglobin 04/08/2012 9.9*  . HCT 04/08/2012 30.6*  . MCV 04/08/2012 96.5   . [facility] 04/08/2012 31.2   . MCHC 04/08/2012 32.4   . RDW 04/08/2012 13.2   . Platelets 04/08/2012 183   . Neutrophils Relative 04/08/2012 33*  . Neutro Abs 04/08/2012 4.5   . Lymphocytes Relative 04/08/2012 57*  . Lymphs Abs 04/08/2012 7.7*  . Monocytes Relative 04/08/2012 5   . Monocytes Absolute 04/08/2012 0.7   . Eosinophils Relative 04/08/2012 4   . Eosinophils Absolute 04/08/2012 0.6   . Basophils Relative 04/08/2012 1   . Basophils Absolute 04/08/2012 0.1   . C difficile by pcr 04/08/2012 POSITIVE*  Clinical Support on 04/06/2012  Component Date Value  . DEVICE MODEL PM 04/06/2012 [id number] H   . [id number] 04/06/2012 Lewayne Bunting   M.D.   . Sherlon Handing 04/06/2012 Lewayne Bunting   M.D.   . PACEART TECH NOTES PM 04/06/2012                     Value:Pacemaker check in clinic. Normal device function. Thresholds, sensing, impedances consistent with previous measurements. Device programmed to maximize longevity. 9 mode switches--longest was 43 seconds. + Warfarin. No high ventricular rates noted.                          Device programmed at appropriate safety margins. Histogram distribution appropriate for patient activity level. Device programmed to optimize intrinsic  conduction. Estimated longevity 3.5 years. ROV in 6 mths w/GT.  [name] ATRIAL PACING PM 04/06/2012 22   . VENTRICULAR PACING PM 04/06/2012 100   . BATTERY VOLTAGE 04/06/2012 2.75   . AL IMPEDENCE PM 04/06/2012 344   . RV LEAD IMPEDENCE PM 04/06/2012 535   . AL AMPLITUDE 04/06/2012 2   . RV LEAD AMPLITUDE 04/06/2012 31.36   . AL THRESHOLD 04/06/2012 0.5   . RV LEAD THRESHOLD 04/06/2012 0.75   . BAMS-0001 04/06/2012 160   . BAMS-0002 04/06/2012 No Delay   Orders Only on 04/05/2012  Component Date Value  . Pro B Natriuretic peptid* 04/05/2012 1475.0*  Office Visit on 04/05/2012  Component Date Value  . Sodium 04/05/2012 140   . Potassium 04/05/2012 5.5*  . Chloride 04/05/2012 105   . CO2 04/05/2012 27   . Glucose, Bld 04/05/2012 126*  . BUN 04/05/2012 33*  . Creat 04/05/2012 1.25*  . Total Bilirubin 04/05/2012 0.2*  . Alkaline Phosphatase 04/05/2012 101   . AST 04/05/2012 23   . ALT 04/05/2012 19   . Total Protein 04/05/2012 6.9   . Albumin 04/05/2012 3.6   . Calcium 04/05/2012 9.6   . D-Dimer, Quant 04/05/2012 0.57*  Anti-coag visit on 03/31/2012  Component Date Value  . INR 03/31/2012 1.6   Anti-coag visit on 03/24/2012  Component Date Value  . INR 03/24/2012 2.6   Infusion on 03/22/2012  Component Date Value  . WBC 03/22/2012 27.7*  . RBC 03/22/2012 3.49*  . Hemoglobin 03/22/2012 10.8*  .  HCT 03/22/2012 33.9*  . MCV 03/22/2012 97.1   . [facility] 03/22/2012 30.9   . MCHC 03/22/2012 31.9   . RDW 03/22/2012 13.7   . Platelets 03/22/2012 264   . Neutrophils Relative 03/22/2012 28*  . Neutro Abs 03/22/2012 7.7   . Lymphocytes Relative 03/22/2012 67*  . Lymphs Abs 03/22/2012 18.7*  . Monocytes Relative 03/22/2012 3   . Monocytes Absolute 03/22/2012 0.8   . Eosinophils Relative 03/22/2012 2   . Eosinophils Absolute 03/22/2012 0.4   . Basophils Relative 03/22/2012 0   . Basophils Absolute 03/22/2012 0.1   . WBC Morphology 03/22/2012 ABSOLUTE LYMPHOCYTOSIS   Anti-coag visit on  02/25/2012  Component Date Value  . INR 02/25/2012 2.1   Anti-coag visit on 01/28/2012  Component Date Value  . INR 01/28/2012 2.1   There may be more visits with results that are not included.   Results for this Opt Visit:     Results for orders placed in visit on 04/11/12  POCT INR      Component Value Range   INR 1.8      EKG Orders placed in visit on 04/12/12  . EKG 12-LEAD     Prior Assessment and Plan Problem List as of 04/12/2012          HYPERLIPIDEMIA   Last Assessment & Plan Note   01/28/2012 Office Visit Addendum 01/29/2012 10:06 AM by Kathlen Brunswick, MD    Total and LDL cholesterol were extremely low in the profile performed 08/2009, which is the most recent test available to me.  It is doubtful there has been a significant increase, and the benefit pharmacologic treatment at this advanced age is somewhat uncertain.  Current therapy will be continued without further testing.    HYPERTENSION   Last Assessment & Plan Note   04/05/2012 Office Visit Signed 04/05/2012  4:50 PM by Jodelle Gross, NP    Blood pressure is elevated on visit today. This may be related to fluid overload, but anxiety can not be ruled out. Will follow up review of CXR and labs. She may need admission for decompensated CHF.    Arteriosclerotic cardiovascular disease (ASCVD)   Last Assessment & Plan Note   01/28/2012 Office Visit Addendum 01/28/2012  2:28 PM by Kathlen Brunswick, MD    Current symptoms could be related to progression of coronary disease, but patient is doing well with medical therapy, which will be continued.    Chronic anticoagulation   Last Assessment & Plan Note   01/28/2012 Office Visit Signed 01/28/2012  2:36 PM by Kathlen Brunswick, MD    Most recent INR performed 2 weeks ago was subtherapeutic; continued monitoring and adjustment of warfarin dosage will be undertaken in our anticoagulation clinic.    Sick sinus syndrome   Last Assessment & Plan Note   11/19/2011  Office Visit Signed 11/20/2011  8:59 AM by Kathlen Brunswick, MD    Paroxysmal atrial fibrillation; dual-chamber pacemaker in 11/2006; Last evaluated by Dr. Ladona Ridgel in 10/2011-he will continue to follow her as appropriate.    Aortic stenosis   Last Assessment & Plan Note   04/05/2012 Office Visit Signed 04/05/2012  4:48 PM by Jodelle Gross, NP    Holosystolic murmur is auscultated but doubt relation to current symptoms. Continue medical management.    Left bundle branch block   Congestive heart failure   Last Assessment & Plan Note   04/05/2012 Office Visit Addendum 04/05/2012  4:59 PM by Bettey Mare  Lawrence, NP    She is no longer on diuretics but exhibits symptoms of CHF at present. She is hypertensive on exam, with decreased breath sounds in the right base. Will have labs drawn to include D-dimer, Pro-BNP, CBC,and BMET, Chest -Xray will be completed as well. I have asked her to take one dose of lasix 20 mg when she returns home. If labs are abnormal will have her to to ER for admission and diureses with worsening symptoms. She is taken over by wheel chair for labs and X-ray.   ADDENDUM: I have reviewed labs and CXR. She is found to be in mild CHF with elevated D-dimer of 1, 475, elevated D-Dimer of >56, Chest X-ray demonstrates new right basilar infiltrate. She will be sent to ER for evaluation and probable admission for diureses and treatment of dyspnea. Call made to her home by Reather Laurence, RN in our office.    Chronic kidney disease   Last Assessment & Plan Note   11/19/2011 Office Visit Signed 11/20/2011  8:54 AM by Kathlen Brunswick, MD    Renal function remains stable and age appropriate.      Gastroesophageal reflux disease   Chronic lymphocytic leukemia   Last Assessment & Plan Note   04/27/2011 Office Visit Signed 04/27/2011  5:36 PM by Kathlen Brunswick, MD    White count has been stable and not markedly elevated, most recently 16,200.  Dr. Mariel Sleet continues to follow this problem, which  has not required much treatment.    Transient blindness   Last Assessment & Plan Note   09/30/2011 Office Visit Addendum 09/30/2011 11:42 AM by Dyann Kief, PA    Resolved and without recurrence    Paroxysmal atrial fibrillation   Last Assessment & Plan Note   04/05/2012 Office Visit Signed 04/05/2012  4:49 PM by Jodelle Gross, NP    She remains in NSR and on anticoagulation. Labs will evaluate for anemia causing symptoms of dyspnea. Will review when available.    Pacemaker   Last Assessment & Plan Note   10/15/2011 Office Visit Signed 10/15/2011 11:41 AM by Marinus Maw, MD    Her device is working normally. We'll plan to recheck in several months. Today we reprogrammed her device from DD IR to DDDR.    Pneumonia, organism unspecified       Imaging: Dg Chest 2 View  04/05/2012  *RADIOLOGY REPORT*  Clinical Data: Shortness of breath  CHEST - 2 VIEW  Comparison: 09/13/2011  Findings: The pacing device is again seen.  Cardiac shadow is stable in appearance.  Increasing infiltrate is noted in the right lung base with associated small effusion.  This is new from prior exam.  IMPRESSION: New right basilar infiltrate.   Original Report Authenticated By: Alcide Clever, M.D.      [facility] Calculation: Score not calculated

## 2012-04-12 NOTE — Assessment & Plan Note (Signed)
Blood pressure slightly elevated today, but much better than last office visit at 170/80. She is possibly having ACE inhibitor cough, even though she has been on this medication for several years. Will stop it and begin Benicar 40 mg daily. ARB has been known to have coughing side effect as well, but will try this first. She is given samples of Benicar from our office. I will see her back in one week. She has HHN coming to see her this week. She is advised to stop the lisinopril. I have given her a RX for [employer] for symptomatic relieve.

## 2012-04-12 NOTE — Assessment & Plan Note (Signed)
She is on lasix daily now instead of prn. She is feeling better and maintaining her wt, trying to avoid salty foods. I have encouraged her to take the lasix everyday and to weigh daily to prevent fluid overload with careful management of lasix dose.

## 2012-04-12 NOTE — Patient Instructions (Addendum)
Your physician recommends that you schedule a follow-up appointment in: 2 WEEKS  Your physician has recommended you make the following change in your medication:   1) STOP LISINOPRIL  2) START BENICAR 40MG  ONE TABLET DAILY 3) TESSALON PEARLS 100MG , TAKE ONE TO TWO TABLETS THREE TIMES DAILY AS NEEDED  PLEASE ADVISE NURSING STAFF TO CONTACT OUR OFFICE IF YOUR BLOOD PRESSURE GOES BELOW 100 ([phone])

## 2012-04-12 NOTE — Assessment & Plan Note (Signed)
Will discuss with Dr. Dietrich Pates possibility of referal to for TAVI as she is very active and young for her age. This was also mentioned on Dr. Eden Emms note during hospitalization. Will evaluate her again in one week.

## 2012-04-12 NOTE — Discharge Summary (Signed)
NAMEEMMERY, SEILER NO.:  [id number]  MEDICAL RECORD NO.:  [id number]  LOCATION:  A301                          FACILITY:  [facility]  PHYSICIAN:  Shykeem Resurreccion L. Juanetta Gosling, M.D.DATE OF BIRTH:  [birth year]  DATE OF ADMISSION:  04/05/2012 DATE OF DISCHARGE:  01/25/2014LH                              DISCHARGE SUMMARY   ADDENDUM:  DISCHARGE DIAGNOSIS:  Acute on chronic diastolic congestive heart failure.  This is from hospitalization beginning April 05, 2012.     Christophr Calix L. Juanetta Gosling, M.D.     [facility]/MEDQ  D:  04/12/2012  T:  04/12/2012  Job:  [id number]

## 2012-04-13 ENCOUNTER — Ambulatory Visit (INDEPENDENT_AMBULATORY_CARE_PROVIDER_SITE_OTHER): Payer: Medicare Other | Admitting: *Deleted

## 2012-04-13 DIAGNOSIS — Z7901 Long term (current) use of anticoagulants: Secondary | ICD-10-CM

## 2012-04-14 ENCOUNTER — Emergency Department (HOSPITAL_COMMUNITY): Payer: Medicare Other

## 2012-04-14 ENCOUNTER — Emergency Department (HOSPITAL_COMMUNITY)
Admission: EM | Admit: 2012-04-14 | Discharge: 2012-04-14 | Disposition: A | Discharge status: Home / Self Care | Payer: Medicare Other | Attending: Emergency Medicine | Admitting: Emergency Medicine

## 2012-04-14 ENCOUNTER — Encounter (HOSPITAL_COMMUNITY): Payer: Self-pay | Admitting: Emergency Medicine

## 2012-04-14 DIAGNOSIS — Z860100 Personal history of colon polyps, unspecified: Secondary | ICD-10-CM | POA: Insufficient documentation

## 2012-04-14 DIAGNOSIS — R0989 Other specified symptoms and signs involving the circulatory and respiratory systems: Secondary | ICD-10-CM | POA: Insufficient documentation

## 2012-04-14 DIAGNOSIS — Z87448 Personal history of other diseases of urinary system: Secondary | ICD-10-CM | POA: Insufficient documentation

## 2012-04-14 DIAGNOSIS — R05 Cough: Secondary | ICD-10-CM | POA: Insufficient documentation

## 2012-04-14 DIAGNOSIS — Z8701 Personal history of pneumonia (recurrent): Secondary | ICD-10-CM | POA: Insufficient documentation

## 2012-04-14 DIAGNOSIS — Z872 Personal history of diseases of the skin and subcutaneous tissue: Secondary | ICD-10-CM | POA: Insufficient documentation

## 2012-04-14 DIAGNOSIS — Z8719 Personal history of other diseases of the digestive system: Secondary | ICD-10-CM | POA: Insufficient documentation

## 2012-04-14 DIAGNOSIS — Z8679 Personal history of other diseases of the circulatory system: Secondary | ICD-10-CM | POA: Insufficient documentation

## 2012-04-14 DIAGNOSIS — C9110 Chronic lymphocytic leukemia of B-cell type not having achieved remission: Secondary | ICD-10-CM | POA: Insufficient documentation

## 2012-04-14 DIAGNOSIS — M1990 Unspecified osteoarthritis, unspecified site: Secondary | ICD-10-CM | POA: Insufficient documentation

## 2012-04-14 DIAGNOSIS — I509 Heart failure, unspecified: Secondary | ICD-10-CM | POA: Insufficient documentation

## 2012-04-14 DIAGNOSIS — R0609 Other forms of dyspnea: Secondary | ICD-10-CM | POA: Insufficient documentation

## 2012-04-14 DIAGNOSIS — D68318 Other hemorrhagic disorder due to intrinsic circulating anticoagulants, antibodies, or inhibitors: Secondary | ICD-10-CM | POA: Insufficient documentation

## 2012-04-14 DIAGNOSIS — R0600 Dyspnea, unspecified: Secondary | ICD-10-CM

## 2012-04-14 DIAGNOSIS — Z79899 Other long term (current) drug therapy: Secondary | ICD-10-CM | POA: Insufficient documentation

## 2012-04-14 DIAGNOSIS — K589 Irritable bowel syndrome without diarrhea: Secondary | ICD-10-CM | POA: Insufficient documentation

## 2012-04-14 DIAGNOSIS — Z7901 Long term (current) use of anticoagulants: Secondary | ICD-10-CM | POA: Insufficient documentation

## 2012-04-14 DIAGNOSIS — R059 Cough, unspecified: Secondary | ICD-10-CM | POA: Insufficient documentation

## 2012-04-14 DIAGNOSIS — K219 Gastro-esophageal reflux disease without esophagitis: Secondary | ICD-10-CM | POA: Insufficient documentation

## 2012-04-14 DIAGNOSIS — Z862 Personal history of diseases of the blood and blood-forming organs and certain disorders involving the immune mechanism: Secondary | ICD-10-CM | POA: Insufficient documentation

## 2012-04-14 DIAGNOSIS — Z8669 Personal history of other diseases of the nervous system and sense organs: Secondary | ICD-10-CM | POA: Insufficient documentation

## 2012-04-14 LAB — CBC WITH DIFFERENTIAL/PLATELET
Basophils Relative: 0 % (ref 0–1)
Eosinophils Absolute: 0.7 10*3/uL (ref 0.0–0.7)
Eosinophils Relative: 4 % (ref 0–5)
HCT: 36.7 % (ref 36.0–46.0)
Hemoglobin: 11.5 g/dL — ABNORMAL LOW (ref 12.0–15.0)
MCH: 30.6 pg (ref 26.0–34.0)
MCHC: 31.3 g/dL (ref 30.0–36.0)
Monocytes Absolute: 0.6 10*3/uL (ref 0.1–1.0)
Monocytes Relative: 3 % (ref 3–12)
RDW: 13.4 % (ref 11.5–15.5)

## 2012-04-14 LAB — TROPONIN I: Troponin I: <0.3 ng/mL (ref <0.30)

## 2012-04-14 LAB — BASIC METABOLIC PANEL
BUN: 43 mg/dL — ABNORMAL HIGH (ref 6–23)
Calcium: 9.6 mg/dL (ref 8.4–10.5)
Creatinine, Ser: 1.5 mg/dL — ABNORMAL HIGH (ref 0.50–1.10)
GFR calc non Af Amer: 29 mL/min — ABNORMAL LOW (ref >90)
Glucose, Bld: 123 mg/dL — ABNORMAL HIGH (ref 70–99)

## 2012-04-14 MED ORDER — ALBUTEROL SULFATE (5 MG/ML) 0.5% IN NEBU
2.5000 mg | INHALATION_SOLUTION | Freq: Once | RESPIRATORY_TRACT | Status: AC
  Administered 2012-04-14: 2.5 mg via RESPIRATORY_TRACT
  Filled 2012-04-14: qty 0.5

## 2012-04-14 MED ORDER — ALBUTEROL SULFATE HFA 108 (90 BASE) MCG/ACT IN AERS
2.0000 | INHALATION_SPRAY | RESPIRATORY_TRACT | Status: DC | PRN
  Filled 2012-04-14: qty 6.7

## 2012-04-14 MED ORDER — IPRATROPIUM BROMIDE 0.02 % IN SOLN
0.5000 mg | Freq: Once | RESPIRATORY_TRACT | Status: AC
  Administered 2012-04-14: 0.5 mg via RESPIRATORY_TRACT
  Filled 2012-04-14: qty 2.5

## 2012-04-14 NOTE — ED Notes (Signed)
Patient given inhaler and instructed by respiratory therapist on use.

## 2012-04-14 NOTE — ED Provider Notes (Addendum)
History     CSN: [id number]  Arrival date & time 04/14/12  0327   First MD Initiated Contact with Patient 04/14/12 [phone]      Chief Complaint  Patient presents with  . Shortness of Breath    (Consider location/radiation/quality/duration/timing/severity/associated sxs/prior treatment) HPI This is a 79 year old female who was admitted here January 21 for congestive heart failure and pneumonia. She was discharged home on January 25. She states that she has been short of breath ever since she was discharged home. Her dyspnea is worsened by exertion or lying flat. She has had to sleep sitting up. This morning her shortness of breath worsened, becoming moderate to severe and she became anxious and called EMS for transport to the ED. She is also complaining of a persistent dry cough. She was noted to have Clostridium difficile while hospitalized and was placed on Flagyl. She states she continues to have diarrhea consistent with her chronic irritable bowel syndrome. She denies chest pain.  Past Medical History  Diagnosis Date  . Sick sinus syndrome 11/2006    paroxysmal atrial fibrillation; dual-chamber pacemaker in 11/2006  . Aortic stenosis     mild  . Left bundle branch block   . Congestive heart failure     Congestive heart failure with normal ejection fraction  . Arteriosclerotic cardiovascular disease (ASCVD)     coronary angio in 10/09:50% left anterior descending; 80% PDA; medical therapy advisedOrthostatic decrease in blood pressure  . Epistaxis     mild with negative ENT evaluation  . Dizziness     chronic  . Osteoarthritis   . Irritable bowel syndrome   . Urinary frequency     with incontinence  . Gastroesophageal reflux disease     Esophageal dilatation for stricture in 09/2011  . Rosacea   . Chronic lymphocytic leukemia     Stage I with anemia H./H. of 9/27 in 11/09 with normal MCV; iron deficiency in bone marrow  . Orthostatic hypotension   . Chronic anticoagulation   .  Diverticulosis   . 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 Chronic kidney disease      Creatinine of 1.7 in 8/08, 1.4 in 10/08 and 1.5 in 11/09    Past Surgical History  Procedure Date  . Cholecystectomy   . Cervical spine surgery   . Lumbar disc surgery   . Bladder suspension   . Abdominal hysterectomy 1972  . Cataract extraction     Bilateral  . Esophagogastroduodenoscopy 01/09/09    Rourk -noncritical Schatzki's ring, small hiatal hernia, fundal gland polyps, bile-stained gastric mucosa, polypoid lesion straddling D1 and D2 1-2 cm, localized lymphangitic-appearing mucosa at D2 biopsied (adenomatous polyp with high grade glandular dysplasia  . A-v cardiac pacemaker insertion 11/2006    [employer]    Family History  Problem Relation Age of Onset  . Heart failure Father   . Cancer Father   . Diabetes Mother     History  Substance Use Topics  . Smoking status: Never Smoker   . Smokeless tobacco: Never Used  . Alcohol Use: No    OB History    Grav Para Term Preterm Abortions TAB SAB Ect Mult Living   2 2 2       2       Review of Systems  All other systems reviewed and are negative.    Allergies  Detrol; [place]; Sulfa antibiotics; and Sulfonamide derivatives  Home Medications   Current Outpatient Rx  Name  Route  Sig  Dispense  Refill  . ACETAMINOPHEN 500 MG PO TABS   Oral   Take 1,000 mg by mouth 4 (four) times daily.           [name] BENZONATATE 100 MG PO CAPS   Oral   Take 1 capsule (100 mg total) by mouth 3 (three) times daily as needed for cough.   30 capsule   1     CAN TAKE ONE TO TWO TABLETS   . CALCIUM CARBONATE-VITAMIN D 500-200 MG-UNIT PO TABS   Oral   Take 1 tablet by mouth every morning.          . FUROSEMIDE 20 MG PO TABS   Oral   Take 20 mg by mouth daily.         [name] METOPROLOL TARTRATE 25 MG PO TABS   Oral   Take 1 tablet (25 mg total) by mouth 2  (two) times daily.   180 tablet   3     Please call for office visit   . METRONIDAZOLE 250 MG PO TABS   Oral   Take 1 tablet (250 mg total) by mouth 4 (four) times daily.   40 tablet   0   . MULTI-VITAMIN/MINERALS PO TABS   Oral   Take 1 tablet by mouth every morning.          [name] NITROGLYCERIN 0.4 MG SL SUBL   Sublingual   Place 1 tablet (0.4 mg total) under the tongue every 5 (five) minutes as needed for chest pain.   25 tablet   11   . NYSTATIN 100000 UNIT/GM EX POWD   Topical   Apply topically 4 (four) times daily.   30 g   2   . OLMESARTAN MEDOXOMIL 40 MG PO TABS   Oral   Take 1 tablet (40 mg total) by mouth daily.   45 tablet   5   . POTASSIUM CHLORIDE CRYS ER 20 MEQ PO TBCR   Oral   Take 20 mEq by mouth daily.         [name] PRAVASTATIN SODIUM 40 MG PO TABS   Oral   Take 40 mg by mouth at bedtime.          [name] VERAPAMIL HCL ER 180 MG PO TBCR   Oral   Take 1 tablet (180 mg total) by mouth at bedtime.   90 tablet   3   . WARFARIN SODIUM 2.5 MG PO TABS   Oral   Take 1.25-2.5 mg by mouth every evening. 2.5 mg daily except she takes 1/2 tablet on thursdays           BP 155/44  Pulse 96  Temp 98.3 F (36.8 C) (Oral)  Resp 15  Ht 5' (1.524 m)  Wt 149 lb (67.586 kg)  BMI 29.10 kg/m2  SpO2 98%  Physical Exam General: Well-developed, well-nourished female in no acute distress; appearance consistent with age of record HENT: normocephalic, atraumatic Eyes: pupils equal round and reactive to light; extraocular muscles intact Neck: supple Heart: regular rate and rhythm; distant sounds Lungs: mild tachypnea; decreased air movement bilaterally; rattly cough Abdomen: soft; nondistended; nontender; no masses or hepatosplenomegaly; bowel sounds present Extremities: No deformity; full range of motion; trace edema of lower legs Neurologic: Awake, alert and oriented; motor function intact in all extremities and symmetric; no facial droop Skin: Warm and  dry Psychiatric: mildly anxious  ED Course  Procedures (including critical care time)     MDM   Nursing notes and vitals signs, including pulse oximetry, reviewed.  Summary of this visit's results, reviewed by myself:  Labs:  Results for orders placed during the hospital encounter of 04/14/12 (from the past 24 hour(s))  CBC WITH DIFFERENTIAL     Status: Abnormal   Collection Time   04/14/12  3:45 AM      Component Value Range   WBC 18.7 (*) 4.0 - 10.5 K/uL   RBC 3.76 (*) 3.87 - 5.11 MIL/uL   Hemoglobin 11.5 (*) 12.0 - 15.0 g/dL   HCT 16.1  09.6 - 04.5 %   MCV 97.6  78.0 - 100.0 fL   MCH 30.6  26.0 - 34.0 pg   MCHC 31.3  30.0 - 36.0 g/dL   RDW 40.9  81.1 - 91.4 %   Platelets 232  150 - 400 K/uL   Neutrophils Relative 37 (*) 43 - 77 %   Neutro Abs 6.9  1.7 - 7.7 K/uL   Lymphocytes Relative 56 (*) 12 - 46 %   Lymphs Abs 10.5 (*) 0.7 - 4.0 K/uL   Monocytes Relative 3  3 - 12 %   Monocytes Absolute 0.6  0.1 - 1.0 K/uL   Eosinophils Relative 4  0 - 5 %   Eosinophils Absolute 0.7  0.0 - 0.7 K/uL   Basophils Relative 0  0 - 1 %   Basophils Absolute 0.1  0.0 - 0.1 K/uL   WBC Morphology ATYPICAL LYMPHOCYTES    BASIC METABOLIC PANEL     Status: Abnormal   Collection Time   04/14/12  3:45 AM      Component Value Range   Sodium 139  135 - 145 mEq/L   Potassium 4.5  3.5 - 5.1 mEq/L   Chloride 105  96 - 112 mEq/L   CO2 21  19 - 32 mEq/L   Glucose, Bld 123 (*) 70 - 99 mg/dL   BUN 43 (*) 6 - 23 mg/dL   Creatinine, Ser 7.82 (*) 0.50 - 1.10 mg/dL   Calcium 9.6  8.4 - 95.6 mg/dL   GFR calc non Af Amer 29 (*) >90 mL/min   GFR calc Af Amer 33 (*) >90 mL/min  PROTIME-INR     Status: Abnormal   Collection Time   04/14/12  3:45 AM      Component Value Range   Prothrombin Time 23.5 (*) 11.6 - 15.2 seconds   INR 2.20 (*) 0.00 - 1.49  TROPONIN I     Status: Normal   Collection Time   04/14/12  3:45 AM      Component Value Range   Troponin I <0.30  <0.30 ng/mL  PRO B NATRIURETIC  PEPTIDE     Status: Normal   Collection Time   04/14/12  3:45 AM      Component Value Range   Pro B Natriuretic peptide (BNP) 442.2  0 - 450 pg/mL    Imaging Studies: Dg Chest 2 View  04/14/2012  *RADIOLOGY REPORT*  Clinical Data: Shortness of breath, cough.  CHEST - 2 VIEW  Comparison: 04/05/2012  Findings: Cardiomegaly.  Central vascular congestion and chronic bibasilar interstitial markings.  Mild right lung base opacity, decreased.  Hyperinflation with flattened hemidiaphragms.  Left chest wall battery pack with lead tips projecting over the right atrium and right ventricle, similar to prior.  Diffuse osteopenia and multilevel degenerative change.  No acute osseous finding.  IMPRESSION:  Decreased right lower lobe opacity; atelectasis versus infiltrate.  Cardiomegaly with central vascular congestion.  No overt edema.  COPD.   Original Report Authenticated By: Jearld Lesch, M.D.       EKG Interpretation:  Date & Time: 04/14/2012 3:32 AM  Rate: 97  Rhythm: electronically paced  QRS Axis: indeterminate  Intervals: electronically paced  ST/T Wave abnormalities: indeterminate  Conduction Disutrbances:electronically paced  Narrative Interpretation:   Old EKG Reviewed: unchanged  5:27 AM Tachypnea resolved, air movement improved after albuterol and Atrovent neb treatment. The patient states she has not had an inhaler for several years. We will provide inhaler and instruct her in its use. She is established with home health. She admits to being anxious and somewhat depressed, and will contact her PCP later today about these concerns.      Hanley Seamen, MD 04/14/12 4540  Hanley Seamen, MD 04/14/12 9811  Hanley Seamen, MD 04/14/12 [phone]

## 2012-04-14 NOTE — ED Notes (Signed)
Patient complaining of shortness of breath and cough. Reports was recently admitted for same, and got somewhat better, but now the same symptoms have come back.

## 2012-04-18 ENCOUNTER — Ambulatory Visit (INDEPENDENT_AMBULATORY_CARE_PROVIDER_SITE_OTHER): Payer: Medicare Other | Admitting: *Deleted

## 2012-04-18 ENCOUNTER — Telehealth: Payer: Self-pay | Admitting: Cardiology

## 2012-04-18 DIAGNOSIS — Z7901 Long term (current) use of anticoagulants: Secondary | ICD-10-CM

## 2012-04-18 NOTE — Telephone Encounter (Signed)
See coumadin note. 

## 2012-04-18 NOTE — Telephone Encounter (Signed)
INR 3.9 SHE IS ON 2.5 MG EVERY DAY EXCEPT THURS DAY TAKE 1/2

## 2012-04-25 ENCOUNTER — Ambulatory Visit (INDEPENDENT_AMBULATORY_CARE_PROVIDER_SITE_OTHER): Payer: Medicare Other | Admitting: *Deleted

## 2012-04-25 DIAGNOSIS — Z7901 Long term (current) use of anticoagulants: Secondary | ICD-10-CM

## 2012-04-25 LAB — POCT INR
INR: 2
INR: 3

## 2012-04-26 ENCOUNTER — Ambulatory Visit (INDEPENDENT_AMBULATORY_CARE_PROVIDER_SITE_OTHER): Payer: Medicare Other | Admitting: Adult Health

## 2012-04-26 ENCOUNTER — Encounter: Payer: Self-pay | Admitting: Adult Health

## 2012-04-26 VITALS — BP 120/63 | HR 77 | Wt 147.0 lb

## 2012-04-26 DIAGNOSIS — I10 Essential (primary) hypertension: Secondary | ICD-10-CM

## 2012-04-26 DIAGNOSIS — I350 Nonrheumatic aortic (valve) stenosis: Secondary | ICD-10-CM

## 2012-04-26 DIAGNOSIS — I509 Heart failure, unspecified: Secondary | ICD-10-CM

## 2012-04-26 DIAGNOSIS — I359 Nonrheumatic aortic valve disorder, unspecified: Secondary | ICD-10-CM

## 2012-04-26 DIAGNOSIS — I480 Paroxysmal atrial fibrillation: Secondary | ICD-10-CM

## 2012-04-26 DIAGNOSIS — I4891 Unspecified atrial fibrillation: Secondary | ICD-10-CM

## 2012-04-26 NOTE — Patient Instructions (Addendum)
Your physician recommends that you schedule a follow-up appointment in: 6 months  Your physician has requested that you have an echocardiogram in June. Echocardiography is a painless test that uses sound waves to create images of your heart. It provides your doctor with information about the size and shape of your heart and how well your heart's chambers and valves are working. This procedure takes approximately one hour. There are no restrictions for this procedure.

## 2012-04-26 NOTE — Assessment & Plan Note (Signed)
Blood pressure is well controlled with change to Benicar from lisinopril. Coughing improved temporarily, but she is now having chest congestion with another round of antibiotics per Dr. Juanetta Gosling. Will keep her off the ACE inhibitor to keep from confusing her cough with use of ACE.

## 2012-04-26 NOTE — Assessment & Plan Note (Signed)
Will repeat her echo in June for annual assessment. She has chronic dyspnea with COPD, and doubt moderate stenosis is contributing her her symptoms of shortness of breath at this time. Will see her in 6 months,

## 2012-04-26 NOTE — Assessment & Plan Note (Signed)
No evidence of fluid overload on this assessment. Continue current medical regimen.

## 2012-04-26 NOTE — Progress Notes (Deleted)
Name: [name]    DOB: 1915-08-01  Age: 79 y.o.  MR#: [id number]       PCP:  [name], MD      Insurance: @PAYORNAME @   CC:   No chief complaint on file.   VS BP 120/63  Pulse 77  Wt 147 lb (66.679 kg)  BMI 28.71 kg/m2  Weights Current Weight  04/26/12 147 lb (66.679 kg)  04/14/12 149 lb (67.586 kg)  04/12/12 152 lb 12 oz (69.287 kg)    Blood Pressure  BP Readings from Last 3 Encounters:  04/26/12 120/63  04/14/12 104/48  04/12/12 139/63     Admit date:  (Not on file) Last encounter with RMR:  04/12/2012   Allergy Allergies  Allergen Reactions  . Detrol (Tolterodine Tartrate) Other (See Comments)    Patient states: "I was told to never take it again, I do not recall the reaction"  . Sanctura (Trospium Chloride) Nausea Only and Other (See Comments)    Intense lower abd pain  . Sulfa Antibiotics Other (See Comments)    Extreme dizziness  . Sulfonamide Derivatives Other (See Comments)    REACTION: GI distress    Current Outpatient Prescriptions  Medication Sig Dispense Refill  . acetaminophen (TYLENOL) 500 MG tablet Take 1,000 mg by mouth 4 (four) times daily.        . calcium-vitamin D (OSCAL WITH D) 500-200 MG-UNIT per tablet Take 1 tablet by mouth every morning.       . cephALEXin (KEFLEX) 500 MG capsule Take 500 mg by mouth 2 (two) times daily.      . furosemide (LASIX) 20 MG tablet Take 20 mg by mouth daily.      . metoprolol tartrate (LOPRESSOR) 25 MG tablet Take 1 tablet (25 mg total) by mouth 2 (two) times daily.  180 tablet  3  . Multiple Vitamins-Minerals (MULTIVITAMIN WITH MINERALS) tablet Take 1 tablet by mouth every morning.       . nitroGLYCERIN (NITROSTAT) 0.4 MG SL tablet Place 1 tablet (0.4 mg total) under the tongue every 5 (five) minutes as needed for chest pain.  25 tablet  11  . nystatin (MYCOSTATIN) powder Apply topically 4 (four) times daily.  30 g  2  . olmesartan (BENICAR) 40 MG tablet Take 1 tablet (40 mg total) by mouth daily.  45  tablet  5  . potassium chloride SA (K-DUR,KLOR-CON) 20 MEQ tablet Take 20 mEq by mouth daily.      . pravastatin (PRAVACHOL) 40 MG tablet Take 40 mg by mouth at bedtime.       . verapamil (CALAN-SR) 180 MG CR tablet Take 1 tablet (180 mg total) by mouth at bedtime.  90 tablet  3  . warfarin (COUMADIN) 2.5 MG tablet Take 1.25-2.5 mg by mouth every evening. 2.5 mg daily except she takes 1/2 tablet on thursdays       No current facility-administered medications for this visit.   Facility-Administered Medications Ordered in Other Visits  Medication Dose Route Frequency Provider Last Rate Last Dose  . epoetin alfa (EPOGEN,PROCRIT) injection 40,000 Units  40,000 Units Subcutaneous Once Randall An, MD        Discontinued Meds:    Medications Discontinued During This Encounter  Medication Reason  . benzonatate (TESSALON) 100 MG capsule Discontinued by provider  . metroNIDAZOLE (FLAGYL) 250 MG tablet Discontinued by provider    Patient Active Problem List  Diagnosis  . HYPERLIPIDEMIA  . HYPERTENSION  . Arteriosclerotic cardiovascular disease (  ASCVD)  . Chronic anticoagulation  . Sick sinus syndrome  . Aortic stenosis  . Left bundle branch block  . Congestive heart failure  . Chronic kidney disease  . Gastroesophageal reflux disease  . Chronic lymphocytic leukemia  . Transient blindness  . Paroxysmal atrial fibrillation  . Pacemaker  . Pneumonia, organism unspecified    LABS Anti-coag visit on 04/25/2012  Component Date Value  . INR 04/25/2012 2.0   Anti-coag visit on 04/18/2012  Component Date Value  . INR 04/18/2012 3.9   Admission on 04/14/2012, Discharged on 04/14/2012  Component Date Value  . WBC 04/14/2012 18.7*  . RBC 04/14/2012 3.76*  . Hemoglobin 04/14/2012 11.5*  . HCT 04/14/2012 36.7   . MCV 04/14/2012 97.6   . [facility] 04/14/2012 30.6   . MCHC 04/14/2012 31.3   . RDW 04/14/2012 13.4   . Platelets 04/14/2012 232   . Neutrophils Relative 04/14/2012 37*  .  Neutro Abs 04/14/2012 6.9   . Lymphocytes Relative 04/14/2012 56*  . Lymphs Abs 04/14/2012 10.5*  . Monocytes Relative 04/14/2012 3   . Monocytes Absolute 04/14/2012 0.6   . Eosinophils Relative 04/14/2012 4   . Eosinophils Absolute 04/14/2012 0.7   . Basophils Relative 04/14/2012 0   . Basophils Absolute 04/14/2012 0.1   . WBC Morphology 04/14/2012 ATYPICAL LYMPHOCYTES   . Sodium 04/14/2012 139   . Potassium 04/14/2012 4.5   . Chloride 04/14/2012 105   . CO2 04/14/2012 21   . Glucose, Bld 04/14/2012 123*  . BUN 04/14/2012 43*  . Creatinine, Ser 04/14/2012 1.50*  . Calcium 04/14/2012 9.6   . GFR calc non Af Amer 04/14/2012 29*  . GFR calc Af Amer 04/14/2012 33*  . Prothrombin Time 04/14/2012 23.5*  . INR 04/14/2012 2.20*  . Troponin I 04/14/2012 <0.30   . Pro B Natriuretic peptid* 04/14/2012 442.2   Anti-coag visit on 04/13/2012  Component Date Value  . INR 04/13/2012 2.5   Anti-coag visit on 04/11/2012  Component Date Value  . INR 04/11/2012 1.8   Admission on 04/05/2012, Discharged on 04/09/2012  Component Date Value  . Prothrombin Time 04/05/2012 22.7*  . INR 04/05/2012 2.10*  . WBC 04/05/2012 15.3*  . RBC 04/05/2012 3.28*  . Hemoglobin 04/05/2012 10.2*  . HCT 04/05/2012 31.4*  . MCV 04/05/2012 95.7   . [facility] 04/05/2012 31.1   . MCHC 04/05/2012 32.5   . RDW 04/05/2012 13.3   . Platelets 04/05/2012 191   . Neutrophils Relative 04/05/2012 42*  . Lymphocytes Relative 04/05/2012 52*  . Monocytes Relative 04/05/2012 3   . Eosinophils Relative 04/05/2012 3   . Basophils Relative 04/05/2012 0   . Neutro Abs 04/05/2012 6.4   . Lymphs Abs 04/05/2012 7.9*  . Monocytes Absolute 04/05/2012 0.5   . Eosinophils Absolute 04/05/2012 0.5   . Basophils Absolute 04/05/2012 0.0   . WBC Morphology 04/05/2012 SMUDGE CELLS   . Sodium 04/05/2012 138   . Potassium 04/05/2012 4.6   . Chloride 04/05/2012 104   . CO2 04/05/2012 24   . Glucose, Bld 04/05/2012 121*  . BUN 04/05/2012 32*   . Creatinine, Ser 04/05/2012 1.21*  . Calcium 04/05/2012 9.6   . Total Protein 04/05/2012 7.1   . Albumin 04/05/2012 3.9   . AST 04/05/2012 23   . ALT 04/05/2012 20   . Alkaline Phosphatase 04/05/2012 104   . Total Bilirubin 04/05/2012 0.3   . GFR calc non Af Amer 04/05/2012 37*  . GFR calc Af Amer 04/05/2012 43*  .  Troponin I 04/05/2012 <0.30   . Pro B Natriuretic peptid* 04/05/2012 1598.0*  . Prothrombin Time 04/06/2012 23.8*  . INR 04/06/2012 2.24*  . WBC 04/06/2012 13.6*  . RBC 04/06/2012 3.02*  . Hemoglobin 04/06/2012 9.4*  . HCT 04/06/2012 29.1*  . MCV 04/06/2012 96.4   . [facility] 04/06/2012 31.1   . MCHC 04/06/2012 32.3   . RDW 04/06/2012 13.4   . Platelets 04/06/2012 180   . Sodium 04/06/2012 141   . Potassium 04/06/2012 4.8   . Chloride 04/06/2012 107   . CO2 04/06/2012 26   . Glucose, Bld 04/06/2012 105*  . BUN 04/06/2012 33*  . Creatinine, Ser 04/06/2012 1.33*  . Calcium 04/06/2012 9.2   . GFR calc non Af Amer 04/06/2012 33*  . GFR calc Af Amer 04/06/2012 38*  . MRSA by PCR 04/06/2012 POSITIVE*  . Prothrombin Time 04/07/2012 23.8*  . INR 04/07/2012 2.24*  . Sodium 04/07/2012 140   . Potassium 04/07/2012 4.5   . Chloride 04/07/2012 104   . CO2 04/07/2012 28   . Glucose, Bld 04/07/2012 108*  . BUN 04/07/2012 43*  . Creatinine, Ser 04/07/2012 1.64*  . Calcium 04/07/2012 9.2   . GFR calc non Af Amer 04/07/2012 26*  . GFR calc Af Amer 04/07/2012 30*  . Prothrombin Time 04/08/2012 22.2*  . INR 04/08/2012 2.04*  . Sodium 04/08/2012 139   . Potassium 04/08/2012 4.4   . Chloride 04/08/2012 103   . CO2 04/08/2012 29   . Glucose, Bld 04/08/2012 107*  . BUN 04/08/2012 45*  . Creatinine, Ser 04/08/2012 1.59*  . Calcium 04/08/2012 9.2   . GFR calc non Af Amer 04/08/2012 27*  . GFR calc Af Amer 04/08/2012 31*  . WBC 04/08/2012 13.7*  . RBC 04/08/2012 3.17*  . Hemoglobin 04/08/2012 9.9*  . HCT 04/08/2012 30.6*  . MCV 04/08/2012 96.5   . [facility] 04/08/2012 31.2   .  MCHC 04/08/2012 32.4   . RDW 04/08/2012 13.2   . Platelets 04/08/2012 183   . Neutrophils Relative 04/08/2012 33*  . Neutro Abs 04/08/2012 4.5   . Lymphocytes Relative 04/08/2012 57*  . Lymphs Abs 04/08/2012 7.7*  . Monocytes Relative 04/08/2012 5   . Monocytes Absolute 04/08/2012 0.7   . Eosinophils Relative 04/08/2012 4   . Eosinophils Absolute 04/08/2012 0.6   . Basophils Relative 04/08/2012 1   . Basophils Absolute 04/08/2012 0.1   . C difficile by pcr 04/08/2012 POSITIVE*  Clinical Support on 04/06/2012  Component Date Value  . DEVICE MODEL PM 04/06/2012 [id number] H   . [id number] 04/06/2012 Lewayne Bunting   M.D.   . Sherlon Handing 04/06/2012 Lewayne Bunting   M.D.   . PACEART TECH NOTES PM 04/06/2012                     Value:Pacemaker check in clinic. Normal device function. Thresholds, sensing, impedances consistent with previous measurements. Device programmed to maximize longevity. 9 mode switches--longest was 43 seconds. + Warfarin. No high ventricular rates noted.                          Device programmed at appropriate safety margins. Histogram distribution appropriate for patient activity level. Device programmed to optimize intrinsic conduction. Estimated longevity 3.5 years. ROV in 6 mths w/GT.  [name] ATRIAL PACING PM 04/06/2012 22   . VENTRICULAR PACING PM 04/06/2012 100   . BATTERY VOLTAGE 04/06/2012 2.75   . AL IMPEDENCE PM 04/06/2012 344   .  RV LEAD IMPEDENCE PM 04/06/2012 535   . AL AMPLITUDE 04/06/2012 2   . RV LEAD AMPLITUDE 04/06/2012 31.36   . AL THRESHOLD 04/06/2012 0.5   . RV LEAD THRESHOLD 04/06/2012 0.75   . BAMS-0001 04/06/2012 160   . BAMS-0002 04/06/2012 No Delay   Orders Only on 04/05/2012  Component Date Value  . Pro B Natriuretic peptid* 04/05/2012 1475.0*  Office Visit on 04/05/2012  Component Date Value  . Sodium 04/05/2012 140   . Potassium 04/05/2012 5.5*  . Chloride 04/05/2012 105   . CO2 04/05/2012 27   . Glucose, Bld 04/05/2012 126*  . BUN  04/05/2012 33*  . Creat 04/05/2012 1.25*  . Total Bilirubin 04/05/2012 0.2*  . Alkaline Phosphatase 04/05/2012 101   . AST 04/05/2012 23   . ALT 04/05/2012 19   . Total Protein 04/05/2012 6.9   . Albumin 04/05/2012 3.6   . Calcium 04/05/2012 9.6   . D-Dimer, Quant 04/05/2012 0.57*  Anti-coag visit on 03/31/2012  Component Date Value  . INR 03/31/2012 1.6   There may be more visits with results that are not included.   Results for this Opt Visit:     Results for orders placed in visit on 04/25/12  POCT INR      Result Value Range   INR 2.0      EKG Orders placed in visit on 04/26/12  . EKG 12-LEAD     Prior Assessment and Plan Problem List as of 04/26/2012     ICD-9-CM     Cardiology Problems   HYPERLIPIDEMIA   Last Assessment & Plan   01/28/2012 Office Visit Edited 01/29/2012 10:06 AM by Kathlen Brunswick, MD     Total and LDL cholesterol were extremely low in the profile performed 08/2009, which is the most recent test available to me.  It is doubtful there has been a significant increase, and the benefit pharmacologic treatment at this advanced age is somewhat uncertain.  Current therapy will be continued without further testing.    HYPERTENSION   Last Assessment & Plan   04/12/2012 Office Visit Written 04/12/2012  2:49 PM by Jodelle Gross, NP     Blood pressure slightly elevated today, but much better than last office visit at 170/80. She is possibly having ACE inhibitor cough, even though she has been on this medication for several years. Will stop it and begin Benicar 40 mg daily. ARB has been known to have coughing side effect as well, but will try this first. She is given samples of Benicar from our office. I will see her back in one week. She has HHN coming to see her this week. She is advised to stop the lisinopril. I have given her a RX for [employer] for symptomatic relieve.     Arteriosclerotic cardiovascular disease (ASCVD)   Last Assessment & Plan    01/28/2012 Office Visit Edited 01/28/2012  2:28 PM by Kathlen Brunswick, MD     Current symptoms could be related to progression of coronary disease, but patient is doing well with medical therapy, which will be continued.    Sick sinus syndrome   Last Assessment & Plan   11/19/2011 Office Visit Written 11/20/2011  8:59 AM by Kathlen Brunswick, MD     Paroxysmal atrial fibrillation; dual-chamber pacemaker in 11/2006; Last evaluated by Dr. Ladona Ridgel in 10/2011-he will continue to follow her as appropriate.    Aortic stenosis   Last Assessment & Plan   04/12/2012 Office  Visit Written 04/12/2012  2:50 PM by Jodelle Gross, NP     Will discuss with Dr. Dietrich Pates possibility of referal to for TAVI as she is very active and young for her age. This was also mentioned on Dr. Eden Emms note during hospitalization. Will evaluate her again in one week.    Left bundle branch block   Congestive heart failure   Last Assessment & Plan   04/12/2012 Office Visit Written 04/12/2012  2:46 PM by Jodelle Gross, NP     She is on lasix daily now instead of prn. She is feeling better and maintaining her wt, trying to avoid salty foods. I have encouraged her to take the lasix everyday and to weigh daily to prevent fluid overload with careful management of lasix dose.     Paroxysmal atrial fibrillation   Last Assessment & Plan   04/05/2012 Office Visit Written 04/05/2012  4:49 PM by Jodelle Gross, NP     She remains in NSR and on anticoagulation. Labs will evaluate for anemia causing symptoms of dyspnea. Will review when available.      Other   Chronic anticoagulation   Last Assessment & Plan   01/28/2012 Office Visit Written 01/28/2012  2:36 PM by Kathlen Brunswick, MD     Most recent INR performed 2 weeks ago was subtherapeutic; continued monitoring and adjustment of warfarin dosage will be undertaken in our anticoagulation clinic.    Chronic kidney disease   Last Assessment & Plan   11/19/2011 Office Visit  Written 11/20/2011  8:54 AM by Kathlen Brunswick, MD     Renal function remains stable and age appropriate.      Gastroesophageal reflux disease   Chronic lymphocytic leukemia   Last Assessment & Plan   04/27/2011 Office Visit Written 04/27/2011  5:36 PM by Kathlen Brunswick, MD     White count has been stable and not markedly elevated, most recently 16,200.  Dr. Mariel Sleet continues to follow this problem, which has not required much treatment.    Transient blindness   Last Assessment & Plan   09/30/2011 Office Visit Edited 09/30/2011 11:42 AM by Dyann Kief, PA     Resolved and without recurrence    Pacemaker   Last Assessment & Plan   10/15/2011 Office Visit Written 10/15/2011 11:41 AM by Marinus Maw, MD     Her device is working normally. We'll plan to recheck in several months. Today we reprogrammed her device from DD IR to DDDR.    Pneumonia, organism unspecified       Imaging: Dg Chest 2 View  04/14/2012  *RADIOLOGY REPORT*  Clinical Data: Shortness of breath, cough.  CHEST - 2 VIEW  Comparison: 04/05/2012  Findings: Cardiomegaly.  Central vascular congestion and chronic bibasilar interstitial markings.  Mild right lung base opacity, decreased.  Hyperinflation with flattened hemidiaphragms.  Left chest wall battery pack with lead tips projecting over the right atrium and right ventricle, similar to prior.  Diffuse osteopenia and multilevel degenerative change.  No acute osseous finding.  IMPRESSION: Decreased right lower lobe opacity; atelectasis versus infiltrate.  Cardiomegaly with central vascular congestion.  No overt edema.  COPD.   Original Report Authenticated By: Jearld Lesch, M.D.    Dg Chest 2 View  04/05/2012  *RADIOLOGY REPORT*  Clinical Data: Shortness of breath  CHEST - 2 VIEW  Comparison: 09/13/2011  Findings: The pacing device is again seen.  Cardiac shadow is stable in appearance.  Increasing infiltrate  is noted in the right lung base with associated small  effusion.  This is new from prior exam.  IMPRESSION: New right basilar infiltrate.   Original Report Authenticated By: Alcide Clever, M.D.      [facility] Calculation: Score not calculated

## 2012-04-26 NOTE — Progress Notes (Signed)
HPI: [name] is an anxious 79 y/o patient of Dr.Rothbart we are seeing for ongoing assessment and treatment of chronic diastolic CHF, with known history of CAD, moderate AoV stenosis, PAF, SSS s/p pacemaker in 1998. On last visit she complained of frequent tickle coughing with chronic COPD symptoms. She has been on multiple antibiotics per Dr. Juanetta Gosling and is now on her 3rd round with Keflex. On last visit I took her off of ACE inhibitor to help with the tickle coughing and gave her tessalon perles for symptomatic relief. She states that she intially felt better for several days, but again had more coughing. She follow up with Dr. Juanetta Gosling who placed her on the third round of antibiotics. She denies chest pain or increased dyspnea.  Allergies  Allergen Reactions  . Detrol (Tolterodine Tartrate) Other (See Comments)    Patient states: "I was told to never take it again, I do not recall the reaction"  . Sanctura (Trospium Chloride) Nausea Only and Other (See Comments)    Intense lower abd pain  . Sulfa Antibiotics Other (See Comments)    Extreme dizziness  . Sulfonamide Derivatives Other (See Comments)    REACTION: GI distress    Current Outpatient Prescriptions  Medication Sig Dispense Refill  . acetaminophen (TYLENOL) 500 MG tablet Take 1,000 mg by mouth 4 (four) times daily.        . calcium-vitamin D (OSCAL WITH D) 500-200 MG-UNIT per tablet Take 1 tablet by mouth every morning.       . cephALEXin (KEFLEX) 500 MG capsule Take 500 mg by mouth 2 (two) times daily.      . furosemide (LASIX) 20 MG tablet Take 20 mg by mouth daily.      . metoprolol tartrate (LOPRESSOR) 25 MG tablet Take 1 tablet (25 mg total) by mouth 2 (two) times daily.  180 tablet  3  . Multiple Vitamins-Minerals (MULTIVITAMIN WITH MINERALS) tablet Take 1 tablet by mouth every morning.       . nitroGLYCERIN (NITROSTAT) 0.4 MG SL tablet Place 1 tablet (0.4 mg total) under the tongue every 5 (five) minutes as needed for  chest pain.  25 tablet  11  . nystatin (MYCOSTATIN) powder Apply topically 4 (four) times daily.  30 g  2  . olmesartan (BENICAR) 40 MG tablet Take 1 tablet (40 mg total) by mouth daily.  45 tablet  5  . potassium chloride SA (K-DUR,KLOR-CON) 20 MEQ tablet Take 20 mEq by mouth daily.      . pravastatin (PRAVACHOL) 40 MG tablet Take 40 mg by mouth at bedtime.       . verapamil (CALAN-SR) 180 MG CR tablet Take 1 tablet (180 mg total) by mouth at bedtime.  90 tablet  3  . warfarin (COUMADIN) 2.5 MG tablet Take 1.25-2.5 mg by mouth every evening. 2.5 mg daily except she takes 1/2 tablet on thursdays       No current facility-administered medications for this visit.   Facility-Administered Medications Ordered in Other Visits  Medication Dose Route Frequency Provider Last Rate Last Dose  . epoetin alfa (EPOGEN,PROCRIT) injection 40,000 Units  40,000 Units Subcutaneous Once Randall An, MD        Past Medical History  Diagnosis Date  . Sick sinus syndrome 11/2006    paroxysmal atrial fibrillation; dual-chamber pacemaker in 11/2006  . Aortic stenosis     mild  . Left bundle branch block   . Congestive heart failure     Congestive  heart failure with normal ejection fraction  . Arteriosclerotic cardiovascular disease (ASCVD)     coronary angio in 10/09:50% left anterior descending; 80% PDA; medical therapy advisedOrthostatic decrease in blood pressure  . Epistaxis     mild with negative ENT evaluation  . Dizziness     chronic  . Osteoarthritis   . Irritable bowel syndrome   . Urinary frequency     with incontinence  . Gastroesophageal reflux disease     Esophageal dilatation for stricture in 09/2011  . Rosacea   . Chronic lymphocytic leukemia     Stage I with anemia H./H. of 9/27 in 11/09 with normal MCV; iron deficiency in bone marrow  . Orthostatic hypotension   . Chronic anticoagulation   . Diverticulosis   . Adenomatous polyp October 2010    (Proximal Small bowel) With high  grade dysplasia in polypoid lesion straddling D1/D2  . Chronic kidney disease      Creatinine of 1.7 in 8/08, 1.4 in 10/08 and 1.5 in 11/09    Past Surgical History  Procedure Laterality Date  . Cholecystectomy    . Cervical spine surgery    . Lumbar disc surgery    . Bladder suspension    . Abdominal hysterectomy  1972  . Cataract extraction      Bilateral  . Esophagogastroduodenoscopy  01/09/09    Rourk -noncritical Schatzki's ring, small hiatal hernia, fundal gland polyps, bile-stained gastric mucosa, polypoid lesion straddling D1 and D2 1-2 cm, localized lymphangitic-appearing mucosa at D2 biopsied (adenomatous polyp with high grade glandular dysplasia  . A-v cardiac pacemaker insertion  11/2006    [employer]    UJW:[id number] of systems complete and found to be negative unless listed above  PHYSICAL EXAM BP 120/63  Pulse 77  Wt 147 lb (66.679 kg)  BMI 28.71 kg/m2  General: Well developed, well nourished, in no acute distress Head: Eyes PERRLA, No xanthomas.   Normal cephalic and atramatic  Lungs: Bilateral rhonchi with frequent coughing during inspiration.[name] Heart: HRRR S1 S2, with 1/6 systolic murmur.  Pulses are 2+ & equal.            No carotid bruit. No JVD.  No abdominal bruits. No femoral bruits. Abdomen: Bowel sounds are positive, abdomen soft and non-tender without masses or                  Hernia's noted. Msk:  Back normal, normal gait. Normal strength and tone for age. Extremities: No clubbing, cyanosis or edema.  DP +1 Neuro: Alert and oriented X 3. Psych:  Good affect, responds appropriately    ASSESSMENT AND PLAN

## 2012-05-02 ENCOUNTER — Ambulatory Visit (INDEPENDENT_AMBULATORY_CARE_PROVIDER_SITE_OTHER): Payer: Medicare Other | Admitting: *Deleted

## 2012-05-02 ENCOUNTER — Telehealth: Payer: Self-pay | Admitting: *Deleted

## 2012-05-02 DIAGNOSIS — Z7901 Long term (current) use of anticoagulants: Secondary | ICD-10-CM

## 2012-05-02 NOTE — Telephone Encounter (Signed)
See coumadin note. 

## 2012-05-02 NOTE — Telephone Encounter (Signed)
INR 3.0 / please call with instructions / tgs  °

## 2012-05-05 ENCOUNTER — Telehealth: Payer: Self-pay | Admitting: *Deleted

## 2012-05-05 NOTE — Telephone Encounter (Signed)
States she had 2 nose bleeds yesterday and only took 1/2 tablet of coumadin last night.  Wants to know what to do.  Told pt to hold coumadin tonight then resume regular schedule on Friday.  [facility] to check INR on 2/24.  Pt in agreement.

## 2012-05-09 ENCOUNTER — Ambulatory Visit (INDEPENDENT_AMBULATORY_CARE_PROVIDER_SITE_OTHER): Payer: Medicare Other | Admitting: *Deleted

## 2012-05-09 LAB — POCT INR: INR: 1.7

## 2012-05-16 ENCOUNTER — Encounter (HOSPITAL_COMMUNITY): Payer: Medicare Other | Attending: Oncology

## 2012-05-16 DIAGNOSIS — J209 Acute bronchitis, unspecified: Secondary | ICD-10-CM | POA: Insufficient documentation

## 2012-05-16 DIAGNOSIS — C9110 Chronic lymphocytic leukemia of B-cell type not having achieved remission: Secondary | ICD-10-CM

## 2012-05-16 DIAGNOSIS — B372 Candidiasis of skin and nail: Secondary | ICD-10-CM | POA: Insufficient documentation

## 2012-05-16 NOTE — Progress Notes (Signed)
Labs drawn today for IGG,IGA,IGM,protein electrophoresis

## 2012-05-17 LAB — IGG, IGA, IGM: IgM, Serum: 19 mg/dL — ABNORMAL LOW (ref 52–322)

## 2012-05-18 LAB — PROTEIN ELECTROPHORESIS, SERUM
Beta 2: 4.4 % (ref 3.2–6.5)
Beta Globulin: 7.3 % — ABNORMAL HIGH (ref 4.7–7.2)
Gamma Globulin: 10 % — ABNORMAL LOW (ref 11.1–18.8)
M-Spike, %: 0.21 g/dL

## 2012-05-19 ENCOUNTER — Ambulatory Visit (INDEPENDENT_AMBULATORY_CARE_PROVIDER_SITE_OTHER): Payer: Medicare Other | Admitting: *Deleted

## 2012-05-19 ENCOUNTER — Telehealth: Payer: Self-pay | Admitting: *Deleted

## 2012-05-19 DIAGNOSIS — Z7901 Long term (current) use of anticoagulants: Secondary | ICD-10-CM

## 2012-05-19 NOTE — Telephone Encounter (Signed)
See coumadin note. 

## 2012-05-19 NOTE — Telephone Encounter (Signed)
INR - 2.4 / PT - 29.2 / please call with instructions / tgs

## 2012-05-24 ENCOUNTER — Other Ambulatory Visit (HOSPITAL_COMMUNITY): Payer: Self-pay

## 2012-06-01 ENCOUNTER — Telehealth: Payer: Self-pay | Admitting: *Deleted

## 2012-06-01 NOTE — Telephone Encounter (Signed)
Has had several nose bleeds and small amt of bleeding from urethra.  Home health nurse to check INR tomorrow and call results to [place].  [facility] aware and already scheduled.

## 2012-06-02 ENCOUNTER — Ambulatory Visit (INDEPENDENT_AMBULATORY_CARE_PROVIDER_SITE_OTHER): Payer: Medicare Other | Admitting: Cardiology

## 2012-06-02 ENCOUNTER — Telehealth: Payer: Self-pay | Admitting: Cardiology

## 2012-06-02 ENCOUNTER — Ambulatory Visit: Payer: Medicare Other | Admitting: Cardiology

## 2012-06-02 NOTE — Telephone Encounter (Signed)
Gave orders for this pts coumadin management.

## 2012-06-02 NOTE — Telephone Encounter (Signed)
inr 2.6 pt 31.7. 2.5 mg every day and 1.25 on thursdays  Last visit for pt is on 06/08/12.

## 2012-06-07 ENCOUNTER — Ambulatory Visit (INDEPENDENT_AMBULATORY_CARE_PROVIDER_SITE_OTHER): Payer: Medicare Other | Admitting: Urology

## 2012-06-07 DIAGNOSIS — N362 Urethral caruncle: Secondary | ICD-10-CM

## 2012-06-07 DIAGNOSIS — R3915 Urgency of urination: Secondary | ICD-10-CM

## 2012-06-08 ENCOUNTER — Ambulatory Visit (INDEPENDENT_AMBULATORY_CARE_PROVIDER_SITE_OTHER): Payer: Medicare Other | Admitting: *Deleted

## 2012-06-08 DIAGNOSIS — Z7901 Long term (current) use of anticoagulants: Secondary | ICD-10-CM

## 2012-06-08 LAB — POCT INR: INR: 1.7

## 2012-06-22 ENCOUNTER — Ambulatory Visit (INDEPENDENT_AMBULATORY_CARE_PROVIDER_SITE_OTHER): Payer: Medicare Other | Admitting: *Deleted

## 2012-06-22 DIAGNOSIS — Z7901 Long term (current) use of anticoagulants: Secondary | ICD-10-CM

## 2012-06-22 DIAGNOSIS — C9110 Chronic lymphocytic leukemia of B-cell type not having achieved remission: Secondary | ICD-10-CM

## 2012-06-22 LAB — POCT INR: INR: 1.9

## 2012-07-06 ENCOUNTER — Ambulatory Visit (INDEPENDENT_AMBULATORY_CARE_PROVIDER_SITE_OTHER): Payer: Medicare Other | Admitting: *Deleted

## 2012-07-06 DIAGNOSIS — Z7901 Long term (current) use of anticoagulants: Secondary | ICD-10-CM

## 2012-07-06 LAB — POCT INR: INR: 1.9

## 2012-07-19 ENCOUNTER — Encounter (HOSPITAL_COMMUNITY): Payer: Medicare Other | Attending: Oncology

## 2012-07-19 DIAGNOSIS — C9110 Chronic lymphocytic leukemia of B-cell type not having achieved remission: Secondary | ICD-10-CM

## 2012-07-19 LAB — CBC WITH DIFFERENTIAL/PLATELET
Basophils Absolute: 0.1 10*3/uL (ref 0.0–0.1)
Basophils Relative: 0 % (ref 0–1)
Lymphocytes Relative: 64 % — ABNORMAL HIGH (ref 12–46)
MCHC: 32.9 g/dL (ref 30.0–36.0)
Monocytes Absolute: 0.7 10*3/uL (ref 0.1–1.0)
Neutro Abs: 5.7 10*3/uL (ref 1.7–7.7)
Platelets: 206 10*3/uL (ref 150–400)
RDW: 14 % (ref 11.5–15.5)
WBC: 18.8 10*3/uL — ABNORMAL HIGH (ref 4.0–10.5)

## 2012-07-19 NOTE — Progress Notes (Signed)
Labs drawn today for cbc/diff,Immunofixation electrophoresis

## 2012-07-20 ENCOUNTER — Encounter (HOSPITAL_COMMUNITY): Payer: Self-pay | Admitting: Oncology

## 2012-07-20 ENCOUNTER — Encounter (HOSPITAL_BASED_OUTPATIENT_CLINIC_OR_DEPARTMENT_OTHER): Payer: Medicare Other | Admitting: Oncology

## 2012-07-20 VITALS — BP 110/68 | HR 84 | Temp 98.1°F | Resp 18 | Wt 155.4 lb

## 2012-07-20 DIAGNOSIS — D638 Anemia in other chronic diseases classified elsewhere: Secondary | ICD-10-CM

## 2012-07-20 DIAGNOSIS — I519 Heart disease, unspecified: Secondary | ICD-10-CM

## 2012-07-20 DIAGNOSIS — C9110 Chronic lymphocytic leukemia of B-cell type not having achieved remission: Secondary | ICD-10-CM

## 2012-07-20 DIAGNOSIS — N289 Disorder of kidney and ureter, unspecified: Secondary | ICD-10-CM

## 2012-07-20 NOTE — Progress Notes (Signed)
[name], MD [address] [place] [address]  Chronic lymphocytic leukemia - Plan: CBC with Differential  CURRENT THERAPY: Observation  INTERVAL HISTORY: [name] 79 y.o. female returns for  regular  visit for followup of CLL, not in need of therapy.   Cozy is doing well.  She reports that she is feeling the side effects of age.  She reports that she parked in the closest parking spot to the [facility] and dreaded walking to the clinic.  She reports that she get fatigued with the walk, but she denies any SOB or dyspnea.  She denies taking a break during ambulation to the clinic.   She denies any B symptoms including fevers, chills, night sweats, unintentional weight loss, and early satiety.  She reports that he only complaint is her IBS.  She admits that every time she goes out with family members, she has issues with the condition.  Most times she makes it to the bathroom in time, but other times she fails to get to a restroom in a timely fashion.  I personally reviewed and went over laboratory results with the patient.  Her Hgb is stable.  Her WBC is elevated, yet stable and platelet count is WNL.  I personally reviewed and went over radiographic studies with the patient.  Her last mammogram was in June 201 and therefore she is due next month.  Hematologically, she denies any complaints and ROS questioning is negative.  Towards the end of our visit, she mentions a thoracic back pain that laterally crosses her back.  She reports that it comes and goes and resolves spontaneously.  She denies any patterns to the discomfort.  She reports that it feels deep.  She denies any rash.  She takes 2000 mg of Acetaminophen daily but this does not seem to help.  She denies any aggravating or alleviating factors. Her complaint and specific features of this are nonspecific.  I have asked her to keep a journal about this discomfort and present it to her PCP in 3 weeks as  she has an appt with Dr. Juanetta Gosling.  Past Medical History  Diagnosis Date  . Sick sinus syndrome 11/2006    paroxysmal atrial fibrillation; dual-chamber pacemaker in 11/2006  . Aortic stenosis     mild  . Left bundle branch block   . Congestive heart failure     Congestive heart failure with normal ejection fraction  . Arteriosclerotic cardiovascular disease (ASCVD)     coronary angio in 10/09:50% left anterior descending; 80% PDA; medical therapy advisedOrthostatic decrease in blood pressure  . Epistaxis     mild with negative ENT evaluation  . Dizziness     chronic  . Osteoarthritis   . Irritable bowel syndrome   . Urinary frequency     with incontinence  . Gastroesophageal reflux disease     Esophageal dilatation for stricture in 09/2011  . Rosacea   . Chronic lymphocytic leukemia     Stage I with anemia H./H. of 9/27 in 11/09 with normal MCV; iron deficiency in bone marrow  . Orthostatic hypotension   . Chronic anticoagulation   . Diverticulosis   . 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 Chronic kidney disease      Creatinine of 1.7 in 8/08, 1.4 in 10/08 and 1.5 in 11/09    has HYPERLIPIDEMIA; HYPERTENSION; Arteriosclerotic cardiovascular disease (ASCVD); Chronic anticoagulation;  Sick sinus syndrome; Aortic stenosis; Left bundle branch block; Congestive heart failure; Chronic kidney disease; Gastroesophageal reflux disease; Chronic lymphocytic leukemia; Transient blindness; Paroxysmal atrial fibrillation; Pacemaker; and Pneumonia, organism unspecified on her problem list.     is allergic to detrol; sanctura; sulfa antibiotics; and sulfonamide derivatives.  Ms. Shifrin does not currently have medications on file.  Past Surgical History  Procedure Laterality Date  . Cholecystectomy    . Cervical spine surgery    . Lumbar disc surgery    . Bladder suspension    . Abdominal hysterectomy  1972  .  Cataract extraction      Bilateral  . Esophagogastroduodenoscopy  01/09/09    Rourk -noncritical Schatzki's ring, small hiatal hernia, fundal gland polyps, bile-stained gastric mucosa, polypoid lesion straddling D1 and D2 1-2 cm, localized lymphangitic-appearing mucosa at D2 biopsied (adenomatous polyp with high grade glandular dysplasia  . A-v cardiac pacemaker insertion  11/2006    [employer]    Denies any headaches, dizziness, double vision, fevers, chills, night sweats, nausea, vomiting, diarrhea, constipation, chest pain, heart palpitations, shortness of breath, blood in stool, black tarry stool, urinary pain, urinary burning, urinary frequency, hematuria.   PHYSICAL EXAMINATION  ECOG PERFORMANCE STATUS: 1 - Symptomatic but completely ambulatory  Filed Vitals:   07/20/12 1300  BP: 110/68  Pulse: 84  Temp: 98.1 F (36.7 C)  Resp: 18    GENERAL:alert, no distress, well nourished, well developed, comfortable, cooperative and smiling SKIN: skin color, texture, turgor are normal, no rashes or significant lesions HEAD: Normocephalic, No masses, lesions, tenderness or abnormalities EYES: normal, Conjunctiva are pink and non-injected EARS: External ears normal OROPHARYNX:mucous membranes are moist  NECK: supple, no adenopathy, thyroid normal size, non-tender, without nodularity, no stridor, non-tender, trachea midline LYMPH:  Possible B/L axillary lymphadenopathy verus fat pad. BREAST:not examined LUNGS: clear to auscultation and percussion HEART: regular rate & rhythm, 1/6 systolic murmur, no gallops, S1 normal and S2 normal ABDOMEN:abdomen soft, non-tender, obese, normal bowel sounds, no masses or organomegaly and no hepatosplenomegaly BACK: Back symmetric, no curvature.  No rash, tenderness to palpation or abnormalities noted. EXTREMITIES:less then 2 second capillary refill, no joint deformities, effusion, or inflammation, no edema, no skin discoloration, no clubbing, no cyanosis   NEURO: alert & oriented x 3 with fluent speech, no focal motor/sensory deficits, gait normal   LABORATORY DATA: CBC    Component Value Date/Time   WBC 18.8* 07/19/2012 1006   RBC 2.98* 07/19/2012 1006   HGB 9.6* 07/19/2012 1006   HCT 29.2* 07/19/2012 1006   PLT 206 07/19/2012 1006   MCV 98.0 07/19/2012 1006   MCH 32.2 07/19/2012 1006   MCHC 32.9 07/19/2012 1006   RDW 14.0 07/19/2012 1006   LYMPHSABS 12.0* 07/19/2012 1006   MONOABS 0.7 07/19/2012 1006   EOSABS 0.4 07/19/2012 1006   BASOSABS 0.1 07/19/2012 1006      RADIOGRAPHIC STUDIES:  09/02/2011  *RADIOLOGY REPORT*  Clinical Data: Focal right breast pain  DIGITAL DIAGNOSTIC BILATERAL MAMMOGRAM WITH CAD AND RIGHT BREAST  ULTRASOUND:  Comparison: With priors  Findings: There are scattered fibroglandular densities. There is  no new suspicious mass or malignant-type microcalcifications in  either breast. Spot tangential view of the area of clinical  concern in the right breast is negative.  Mammographic images were processed with CAD.  On physical exam, I do not palpate a discrete mass in the right  breast.  Ultrasound is performed, showing normal tissue in area of clinical  concern in the upper outer  quadrant of the right breast.  IMPRESSION:  No evidence of malignancy in either breast.  RECOMMENDATION:  Bilateral screening mammogram in 1 year is recommended. The  importance of self breast examination was discussed with the  patient.  BI-RADS CATEGORY 1: Negative.  Original Report Authenticated By: Littie Deeds. ARCEO, M.D.     ASSESSMENT:  1. CLL, not in need of therapy at this time 2. Anemia of chronic disease secondary to mild renal insufficiency +/- CLL.  Last treated with Procrit in 2012. 3. Heart disease with aortic stenosis 4. History of heart failure   PLAN:  1. I personally reviewed and went over laboratory results with the patient. 2. I personally reviewed and went over radiographic studies with the patient. 3. Next screening  mammogram is due in June 2014. 4. Labs in 8 weeks and 16 weeks: CBC diff 5. Follow-up with PCP as scheduled 6. Return in 4 months for follow-up.   All questions were answered. The patient knows to call the clinic with any problems, questions or concerns. We can certainly see the patient much sooner if necessary.  The patient and plan discussed with Glenford Peers, MD and he is in agreement with the aforementioned.  [name]

## 2012-07-20 NOTE — Patient Instructions (Addendum)
[facility] Discharge Instructions  RECOMMENDATIONS MADE BY THE CONSULTANT AND ANY TEST RESULTS WILL BE SENT TO YOUR REFERRING PHYSICIAN.  Lab work every 8 weeks. Return to clinic in 4 months to see MD.  Thank you for choosing [facility] to provide your oncology and hematology care.  To afford each patient quality time with our providers, please arrive at least 15 minutes before your scheduled appointment time.  With your help, our goal is to use those 15 minutes to complete the necessary work-up to ensure our physicians have the information they need to help with your evaluation and healthcare recommendations.    Effective January 1st, 2014, we ask that you re-schedule your appointment with our physicians should you arrive 10 or more minutes late for your appointment.  We strive to give you quality time with our providers, and arriving late affects you and other patients whose appointments are after yours.    Again, thank you for choosing [facility].  Our hope is that these requests will decrease the amount of time that you wait before being seen by our physicians.       _____________________________________________________________  Should you have questions after your visit to [facility], please contact our office at [phone] between the hours of 8:30 a.m. and 5:00 p.m.  Voicemails left after 4:30 p.m. will not be returned until the following business day.  For prescription refill requests, have your pharmacy contact our office with your prescription refill request.

## 2012-07-25 ENCOUNTER — Ambulatory Visit (INDEPENDENT_AMBULATORY_CARE_PROVIDER_SITE_OTHER): Payer: Medicare Other | Admitting: *Deleted

## 2012-07-25 DIAGNOSIS — Z7901 Long term (current) use of anticoagulants: Secondary | ICD-10-CM

## 2012-07-25 LAB — IMMUNOFIXATION ELECTROPHORESIS
IgA: 165 mg/dL (ref 69–380)
IgG (Immunoglobin G), Serum: 664 mg/dL — ABNORMAL LOW (ref 690–1700)

## 2012-07-25 LAB — POCT INR: INR: 2.2

## 2012-08-15 ENCOUNTER — Ambulatory Visit (INDEPENDENT_AMBULATORY_CARE_PROVIDER_SITE_OTHER): Payer: Medicare Other | Admitting: *Deleted

## 2012-08-15 ENCOUNTER — Ambulatory Visit (HOSPITAL_COMMUNITY): Payer: Medicare Other | Admitting: Physical Therapy

## 2012-08-15 DIAGNOSIS — Z7901 Long term (current) use of anticoagulants: Secondary | ICD-10-CM

## 2012-08-15 LAB — POCT INR: INR: 2

## 2012-08-17 ENCOUNTER — Ambulatory Visit (HOSPITAL_COMMUNITY)
Admission: RE | Admit: 2012-08-17 | Discharge: 2012-08-17 | Disposition: A | Discharge status: Home / Self Care | Payer: Medicare Other | Source: Ambulatory Visit | Attending: Pulmonary Disease | Admitting: Pulmonary Disease

## 2012-08-17 DIAGNOSIS — M6281 Muscle weakness (generalized): Secondary | ICD-10-CM | POA: Insufficient documentation

## 2012-08-17 DIAGNOSIS — IMO0001 Reserved for inherently not codable concepts without codable children: Secondary | ICD-10-CM | POA: Insufficient documentation

## 2012-08-17 DIAGNOSIS — R262 Difficulty in walking, not elsewhere classified: Secondary | ICD-10-CM | POA: Insufficient documentation

## 2012-08-17 DIAGNOSIS — Z9181 History of falling: Secondary | ICD-10-CM | POA: Insufficient documentation

## 2012-08-17 DIAGNOSIS — I10 Essential (primary) hypertension: Secondary | ICD-10-CM | POA: Insufficient documentation

## 2012-08-17 DIAGNOSIS — W19XXXA Unspecified fall, initial encounter: Secondary | ICD-10-CM | POA: Insufficient documentation

## 2012-08-17 NOTE — Evaluation (Signed)
Physical Therapy Evaluation  Patient Details  Name: [name] MRN: [id number] Date of Birth: February 10, 1916 Charge:   evaluation Today's Date: 08/17/2012 Time: 1525-0604 PT Time Calculation (min): 879 min              Visit#: 1 of 12  Re-eval: 09/16/12 Assessment Diagnosis: weakness Next MD Visit: 10/04/2012 Prior Therapy: HH  Authorization: medicare    Authorization Visit#: 1 of 10   Past Medical History:  Past Medical History  Diagnosis Date  . Sick sinus syndrome 11/2006    paroxysmal atrial fibrillation; dual-chamber pacemaker in 11/2006  . Aortic stenosis     mild  . Left bundle branch block   . Congestive heart failure     Congestive heart failure with normal ejection fraction  . Arteriosclerotic cardiovascular disease (ASCVD)     coronary angio in 10/09:50% left anterior descending; 80% PDA; medical therapy advisedOrthostatic decrease in blood pressure  . Epistaxis     mild with negative ENT evaluation  . Dizziness     chronic  . Osteoarthritis   . Irritable bowel syndrome   . Urinary frequency     with incontinence  . Gastroesophageal reflux disease     Esophageal dilatation for stricture in 09/2011  . Rosacea   . Chronic lymphocytic leukemia     Stage I with anemia H./H. of 9/27 in 11/09 with normal MCV; iron deficiency in bone marrow  . Orthostatic hypotension   . Chronic anticoagulation   . Diverticulosis   . 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 Chronic kidney disease      Creatinine of 1.7 in 8/08, 1.4 in 10/08 and 1.5 in 11/09   Past Surgical History:  Past Surgical History  Procedure Laterality Date  . Cholecystectomy    . Cervical spine surgery    . Lumbar disc surgery    . Bladder suspension    . Abdominal hysterectomy  1972  . Cataract extraction      Bilateral  . Esophagogastroduodenoscopy  01/09/09    Rourk -noncritical Schatzki's ring, small hiatal hernia,  fundal gland polyps, bile-stained gastric mucosa, polypoid lesion straddling D1 and D2 1-2 cm, localized lymphangitic-appearing mucosa at D2 biopsied (adenomatous polyp with high grade glandular dysplasia  . A-v cardiac pacemaker insertion  11/2006    [employer]    Subjective Symptoms/Limitations Symptoms: Ms. Markham states that she was in the hospital in January with CHF and pneumonia.  She states that she was discharged home and and home health.  She states that the [facility] improved her strength but they stopped coming out to the house at the end of March. She states that she did not continue with her exercises and it feels as if she is going backwards.  She states that it is all she can do to just do her housework.  The patient lives by herself and wants to continue this but feels if she doesn't get any help she wont' be able to .  How long can you stand comfortably?: She states she becomes weak almost immediately but forces herself to stand for fifteen minutes.   How long can you walk comfortably?: The patient is walking with a cane.  She is able to walk for 143ft and then she needs to sit down.   Patient Stated Goals: The patient wants to be able to do more.  Feel like she can clean her house.  To  be able to go to the grocery store.  Pain Assessment Currently in Pain?: No/denies  Precautions/Restrictions  Precautions Precautions: Fall  Balance Screening Balance Screen Has the patient fallen in the past 6 months: Yes How many times?: 2 Has the patient had a decrease in activity level because of a fear of falling? : Yes Is the patient reluctant to leave their home because of a fear of falling? : Yes  Prior Functioning  Home Living Lives With: Alone  Cognition/Observation Cognition Overall Cognitive Status: Within Functional Limits for tasks assessed  Sensation/Coordination/Flexibility/Functional Tests Functional Tests Functional Tests: unable to come sit to stand without use of  hands Functional Tests: TUG 27 seconds.  Assessment RLE Strength Right Hip Flexion: 3/5 Right Hip Extension: 2-/5 Right Hip ABduction: 4/5 Right Knee Flexion: 4/5 Right Knee Extension: 3+/5 Right Ankle Dorsiflexion: 5/5 LLE Strength Left Hip Flexion: 3/5 Left Hip Extension: 2-/5 Left Hip ABduction: 4/5 Left Knee Flexion: 4/5 Left Knee Extension: 3+/5 Left Ankle Dorsiflexion: 5/5  Exercise/Treatments Mobility/Balance  Static Standing Balance Single Leg Stance - Right Leg: 1 Single Leg Stance - Left Leg: 1     Standing Heel Raises: 10 reps Functional Squat: 5 reps SLS:  (1) Seated Long Arc Quad: 5 reps Supine Bridges: 5 reps Straight Leg Raises: 5 reps  Physical Therapy Assessment and Plan PT Assessment and Plan Clinical Impression Statement: Ms. Schmuck is a pleasant 79yo who complains that she is having difficulty walking.  She has fallen twice in the past several months.  Exam shows decreased strength, decreased balance and decreased activity tolerance,(pt needed rest breaks with exercises).  Pt will benefit from skilled therapy to improve her strength and balance to improve her safety at home.   Pt will benefit from skilled therapeutic intervention in order to improve on the following deficits: Decreased activity tolerance;Decreased balance;Difficulty walking;Decreased strength Rehab Potential: Good PT Frequency: Min 3X/week PT Duration: 4 weeks PT Treatment/Interventions: Gait training;Patient/family education;Therapeutic activities;Therapeutic exercise PT Plan: see pt for balance and strengthening     Goals Home Exercise Program Pt will Perform Home Exercise Program: Independently PT Short Term Goals Time to Complete Short Term Goals: 2 weeks PT Short Term Goal 1: Pt to be ambulating 15 minutes without SOB or fatigue. PT Short Term Goal 2: Pt to be able to stand for 20 minutes to make a meal without difficulty PT Long Term Goals Time to Complete Long Term Goals:  4 weeks PT Long Term Goal 1: I in advance HEP PT Long Term Goal 2: Pt to be able to walk for 20 minutes to continue a healthy lifestyle to stay living I. Long Term Goal 3: Pt able to come sit to stand 2 times without using hands. Long Term Goal 4: Pt TUG to improve by 10 seconds. PT Long Term Goal 5: Pt to be able to complete her own housework  Problem List Patient Active Problem List   Diagnosis Date Noted  . Falls 08/17/2012  . Difficulty in walking(719.7) 08/17/2012  . Pneumonia, organism unspecified 04/06/2012  . Pacemaker 10/15/2011  . Transient blindness 09/14/2011  . Paroxysmal atrial fibrillation 09/14/2011  . Aortic stenosis   . Left bundle branch block   . Congestive heart failure   . Chronic kidney disease   . Gastroesophageal reflux disease   . Chronic lymphocytic leukemia   . Chronic anticoagulation 06/10/2010  . Arteriosclerotic cardiovascular disease (ASCVD) 03/22/2009  . HYPERLIPIDEMIA 12/17/2008  . HYPERTENSION 12/17/2008  . Sick sinus syndrome 11/15/2006  General Behavior During Therapy: WFL for tasks assessed/performed PT Plan of Care PT Home Exercise Plan: given  GP Functional Assessment Tool Used: self assessment Functional Limitation: Self care Self Care Current Status (Z6109): At least 40 percent but less than 60 percent impaired, limited or restricted Self Care Goal Status (U0454): At least 20 percent but less than 40 percent impaired, limited or restricted  RUSSELL,CINDY 08/17/2012, 4:33 PM  Physician Documentation Your signature is required to indicate approval of the treatment plan as stated above.  Please sign and either send electronically or make a copy of this report for your files and return this physician signed original.   Please mark one 1.__approve of plan  2. ___approve of plan with the following conditions.   ______________________________                                                          _____________________ Physician  Signature                                                                                                             Date

## 2012-08-22 ENCOUNTER — Ambulatory Visit (HOSPITAL_COMMUNITY)
Admission: RE | Admit: 2012-08-22 | Discharge: 2012-08-22 | Disposition: A | Discharge status: Home / Self Care | Payer: Medicare Other | Source: Ambulatory Visit | Attending: Pulmonary Disease | Admitting: Pulmonary Disease

## 2012-08-22 DIAGNOSIS — R262 Difficulty in walking, not elsewhere classified: Secondary | ICD-10-CM

## 2012-08-22 NOTE — Progress Notes (Signed)
out Physical Therapy Treatment Patient Details  Name: [name] MRN: [id number] Date of Birth: 03-Apr-1915  Today's Date: 08/22/2012 Time: 1150-1230 PT Time Calculation (min): 40 min There ex 4098-1191; neuro re-ed 1215-1230 Visit#: 2 of 12  Re-eval: 09/16/12    Authorization:   medicare Authorization Time Period:    Authorization Visit#: 2 of 10   Subjective: Symptoms/Limitations Symptoms: Pt states that she has been doing her exercises.  Precautions/Restrictions    falls Exercise/Treatments   Aerobic Stationary Bike: Nustep L 2 x 5'   Standing Heel Raises: 10 reps Lateral Step Up: Both;10 reps Functional Squat: 10 reps Seated Long Arc Quad: Strengthening;10 reps;Weights Long Arc Quad Weight: 3 lbs. Other Seated Knee Exercises: sit to stand;x 5; side step, retroe and tandem walk x 2 RT    Physical Therapy Assessment and Plan PT Assessment and Plan Clinical Impression Statement: Ms. Frogge needed verbal cuing to keep proper posture with exercises.  Pt needed to take two small rest breaks due to fatigue with activity.   Pt will benefit from skilled therapeutic intervention in order to improve on the following deficits: Decreased activity tolerance;Decreased balance;Difficulty walking;Decreased strength Rehab Potential: Good PT Frequency: Min 3X/week PT Duration: 4 weeks PT Plan: begin forward step ups next treatment     Goals  progressing  Problem List Patient Active Problem List   Diagnosis Date Noted  . Falls 08/17/2012  . Difficulty in walking(719.7) 08/17/2012  . Pneumonia, organism unspecified 04/06/2012  . Pacemaker 10/15/2011  . Transient blindness 09/14/2011  . Paroxysmal atrial fibrillation 09/14/2011  . Aortic stenosis   . Left bundle branch block   . Congestive heart failure   . Chronic kidney disease   . Gastroesophageal reflux disease   . Chronic lymphocytic leukemia   . Chronic anticoagulation 06/10/2010  . Arteriosclerotic  cardiovascular disease (ASCVD) 03/22/2009  . HYPERLIPIDEMIA 12/17/2008  . HYPERTENSION 12/17/2008  . Sick sinus syndrome 11/15/2006    General Behavior During Therapy: [facility] for tasks assessed/performed  GP    RUSSELL,CINDY 08/22/2012, 12:32 PM

## 2012-08-23 ENCOUNTER — Ambulatory Visit (HOSPITAL_COMMUNITY)
Admission: RE | Admit: 2012-08-23 | Discharge: 2012-08-23 | Disposition: A | Discharge status: Home / Self Care | Payer: Medicare Other | Source: Ambulatory Visit | Attending: Pulmonary Disease | Admitting: Pulmonary Disease

## 2012-08-23 ENCOUNTER — Ambulatory Visit (HOSPITAL_COMMUNITY)
Admission: RE | Admit: 2012-08-23 | Discharge: 2012-08-23 | Disposition: A | Discharge status: Home / Self Care | Payer: Medicare Other | Source: Ambulatory Visit | Attending: Adult Health | Admitting: Adult Health

## 2012-08-23 DIAGNOSIS — I359 Nonrheumatic aortic valve disorder, unspecified: Secondary | ICD-10-CM

## 2012-08-23 DIAGNOSIS — I10 Essential (primary) hypertension: Secondary | ICD-10-CM | POA: Insufficient documentation

## 2012-08-23 DIAGNOSIS — R262 Difficulty in walking, not elsewhere classified: Secondary | ICD-10-CM

## 2012-08-23 DIAGNOSIS — I480 Paroxysmal atrial fibrillation: Secondary | ICD-10-CM

## 2012-08-23 DIAGNOSIS — I2510 Atherosclerotic heart disease of native coronary artery without angina pectoris: Secondary | ICD-10-CM | POA: Insufficient documentation

## 2012-08-23 DIAGNOSIS — I447 Left bundle-branch block, unspecified: Secondary | ICD-10-CM | POA: Insufficient documentation

## 2012-08-23 DIAGNOSIS — I4891 Unspecified atrial fibrillation: Secondary | ICD-10-CM | POA: Insufficient documentation

## 2012-08-23 DIAGNOSIS — E785 Hyperlipidemia, unspecified: Secondary | ICD-10-CM | POA: Insufficient documentation

## 2012-08-23 NOTE — Progress Notes (Signed)
Physical Therapy Treatment Patient Details  Name: [name] MRN: [id number] Date of Birth: [birth year]  Today's Date: 08/23/2012 Time: 4098-1191 PT Time Calculation (min): 40 min Charge ;TE 28' 0402-0430, NMR 12' 1630-1642  Visit#: 3 of 12  Re-eval: 09/16/12 Assessment Diagnosis: weakness Next MD Visit: 10/04/2012 Prior Therapy: HH  Authorization: medicare  Authorization Time Period:    Authorization Visit#: 3 of 10   Subjective: Symptoms/Limitations Symptoms: Pt stated she was exhausted today, has not completed HEP exercises today. Pain Assessment Currently in Pain?: No/denies  Precautions/Restrictions  Precautions Precautions: Fall  Exercise/Treatments Aerobic Stationary Bike: Nustep L 2 x 5' SPM average 25, unable to increase due to limited endurance Standing Heel Raises: 10 reps;Limitations Heel Raises Limitations: Toe raises 10 reps Lateral Step Up: Both;10 reps;Hand Hold: 2;Step Height: 4" Forward Step Up: Both;10 reps;Hand Hold: 1;Step Height: 4" Functional Squat: 10 reps Other Standing Knee Exercises: sidestep, tandem and retro gait 2RT Seated Long Arc Quad: Strengthening;10 reps;Weights Long Arc Quad Weight: 3 lbs. Other Seated Knee Exercises: 5 STS no HHA  Supine   Sidelying   Prone         Physical Therapy Assessment and Plan PT Assessment and Plan Clinical Impression Statement: Added forward step up for functional strengthening with min cueing for technique.  Pt did require cueing for posture through session, adjusted height on RW to improve posture and gait mechanics.  Pt limited by fatigue, required 2 rest breaks through session. PT Plan: Progress current POC.    Goals    Problem List Patient Active Problem List   Diagnosis Date Noted  . Falls 08/17/2012  . Difficulty in walking(719.7) 08/17/2012  . Pneumonia, organism unspecified 04/06/2012  . Pacemaker 10/15/2011  . Transient blindness 09/14/2011  . Paroxysmal atrial fibrillation  09/14/2011  . Aortic stenosis   . Left bundle branch block   . Congestive heart failure   . Chronic kidney disease   . Gastroesophageal reflux disease   . Chronic lymphocytic leukemia   . Chronic anticoagulation 06/10/2010  . Arteriosclerotic cardiovascular disease (ASCVD) 03/22/2009  . HYPERLIPIDEMIA 12/17/2008  . HYPERTENSION 12/17/2008  . Sick sinus syndrome 11/15/2006    PT - End of Session Equipment Utilized During Treatment: Gait belt Activity Tolerance: Patient limited by fatigue;Patient tolerated treatment well General Behavior During Therapy: [facility] for tasks assessed/performed Cognition: WFL for tasks performed PT Plan of Care PT Patient Instructions: Educated pt importance of proper posture, adjusted RW to improve posture and gait mechanics.    GP    Juel Burrow 08/23/2012, 4:53 PM

## 2012-08-23 NOTE — Progress Notes (Signed)
*  PRELIMINARY RESULTS* Echocardiogram 2D Echocardiogram has been performed.  Conrad Ferndale 08/23/2012, 1:49 PM

## 2012-08-25 ENCOUNTER — Ambulatory Visit (HOSPITAL_COMMUNITY)
Admission: RE | Admit: 2012-08-25 | Discharge: 2012-08-25 | Disposition: A | Discharge status: Home / Self Care | Payer: Medicare Other | Source: Ambulatory Visit | Attending: Pulmonary Disease | Admitting: Pulmonary Disease

## 2012-08-25 DIAGNOSIS — R262 Difficulty in walking, not elsewhere classified: Secondary | ICD-10-CM

## 2012-08-25 NOTE — Progress Notes (Signed)
Physical Therapy Treatment Patient Details  Name: [name] MRN: [id number] Date of Birth: 1915-10-31  Today's Date: 08/25/2012 Time: 0865-7846 PT Time Calculation (min): 45 min  Visit#: 4 of 12  Re-eval: 09/16/12  there ex x [id number] there ex x 3  Authorization: medicare   Authorization Visit#: 4 of 10   Subjective: Symptoms/Limitations Symptoms: Pt states treatment is getting a little easier   Exercise/Treatments   Aerobic Stationary Bike: Nustep L 2 x 6' SPM average 30   Standing Heel Raises: 15 reps;Limitations Lateral Step Up: Both;10 reps;Hand Hold: 2;Step Height: 4" Forward Step Up: Both;10 reps;Hand Hold: 1;Step Height: 4" Functional Squat: 10 reps SLS: x 5" max  Other Standing Knee Exercises: sidestep, tandem and retro gait 2RT Other Standing Knee Exercises: marching x 10 Seated Long Arc Quad: Strengthening;10 reps;Weights Long Arc Quad Weight: 3 lbs. Other Seated Knee Exercises: 5 STS no HHA     Physical Therapy Assessment and Plan PT Assessment and Plan Clinical Impression Statement: Added marching for balance and strength.  Pt continues to improve in form and activity tolerance. Pt needed one rest but able to complete LAQ during this rest time. PT Frequency: Min 3X/week PT Duration: 4 weeks PT Treatment/Interventions: Gait training;Patient/family education;Therapeutic activities;Therapeutic exercise PT Plan: Progress current POC.    Goals   progressing Problem List Patient Active Problem List   Diagnosis Date Noted  . Falls 08/17/2012  . Difficulty in walking(719.7) 08/17/2012  . Pneumonia, organism unspecified 04/06/2012  . Pacemaker 10/15/2011  . Transient blindness 09/14/2011  . Paroxysmal atrial fibrillation 09/14/2011  . Aortic stenosis   . Left bundle branch block   . Congestive heart failure   . Chronic kidney disease   . Gastroesophageal reflux disease   . Chronic lymphocytic leukemia   . Chronic anticoagulation 06/10/2010  .  Arteriosclerotic cardiovascular disease (ASCVD) 03/22/2009  . HYPERLIPIDEMIA 12/17/2008  . HYPERTENSION 12/17/2008  . Sick sinus syndrome 11/15/2006    PT - End of Session Equipment Utilized During Treatment: Gait belt Activity Tolerance: Patient tolerated treatment well General Cognition: WFL for tasks performed  GP Functional Assessment Tool Used: self assessment  RUSSELL,CINDY 08/25/2012, 3:26 PM

## 2012-08-29 ENCOUNTER — Ambulatory Visit (HOSPITAL_COMMUNITY)
Admission: RE | Admit: 2012-08-29 | Discharge: 2012-08-29 | Disposition: A | Discharge status: Home / Self Care | Payer: Medicare Other | Source: Ambulatory Visit | Attending: Pulmonary Disease | Admitting: Pulmonary Disease

## 2012-08-29 NOTE — Progress Notes (Signed)
Physical Therapy Treatment Patient Details  Name: [name] MRN: [id number] Date of Birth: [birth year]  Today's Date: 08/29/2012 Time: 4098-1191 PT Time Calculation (min): 42 min  Visit#: 5 of 12  Re-eval: 09/16/12 Charges: Self care x 10' Therex x 23'  Authorization: medicare  Authorization Visit#: 5 of 10   Subjective: Symptoms/Limitations Symptoms: Pt states that she almost canceled her appointment. She states she feels very tired today. Pain Assessment Currently in Pain?: No/denies   Exercise/Treatments Aerobic Stationary Bike: Nustep L 2 x 8' SPM average 30 Seated Long Arc Quad: Strengthening;10 reps;Weights Long Arc Quad Weight: 3 lbs. Other Seated Knee Exercises: Seated march with 3# wt x 10; Hip adduction isometric with orange ball 10x5"  Physical Therapy Assessment and Plan PT Assessment and Plan Clinical Impression Statement: Pt's activity tolerance is very low this session. Pt reports SOB. Blood pressure was measured at 110/48, O2 measured at 97%. She states that sometimes her blood pressure medicine causes her blood pressure to drop. Advised pt to contact MD ASAP. Standing therex held this session secondary to fatigue. PT Frequency: Min 3X/week PT Duration: 4 weeks PT Treatment/Interventions: Gait training;Patient/family education;Therapeutic activities;Therapeutic exercise PT Plan: Continue to progress strength and activity tolerance per PT POC.      Problem List Patient Active Problem List   Diagnosis Date Noted  . Falls 08/17/2012  . Difficulty in walking(719.7) 08/17/2012  . Pneumonia, organism unspecified 04/06/2012  . Pacemaker 10/15/2011  . Transient blindness 09/14/2011  . Paroxysmal atrial fibrillation 09/14/2011  . Aortic stenosis   . Left bundle branch block   . Congestive heart failure   . Chronic kidney disease   . Gastroesophageal reflux disease   . Chronic lymphocytic leukemia   . Chronic anticoagulation 06/10/2010  .  Arteriosclerotic cardiovascular disease (ASCVD) 03/22/2009  . HYPERLIPIDEMIA 12/17/2008  . HYPERTENSION 12/17/2008  . Sick sinus syndrome 11/15/2006    PT - End of Session Equipment Utilized During Treatment: Gait belt Activity Tolerance: Patient tolerated treatment well;Patient limited by fatigue General Behavior During Therapy: [facility] for tasks assessed/performed Cognition: [facility] for tasks performed  GP Functional Assessment Tool Used: self assessment  Seth Bake, PTA 08/29/2012, 3:18 PM

## 2012-08-31 ENCOUNTER — Telehealth (HOSPITAL_COMMUNITY): Payer: Self-pay

## 2012-08-31 ENCOUNTER — Inpatient Hospital Stay (HOSPITAL_COMMUNITY): Admission: RE | Admit: 2012-08-31 | Payer: Medicare Other | Source: Ambulatory Visit

## 2012-09-02 ENCOUNTER — Ambulatory Visit (HOSPITAL_COMMUNITY)
Admission: RE | Admit: 2012-09-02 | Discharge: 2012-09-02 | Disposition: A | Discharge status: Home / Self Care | Payer: Medicare Other | Source: Ambulatory Visit | Attending: Cardiology | Admitting: Cardiology

## 2012-09-02 ENCOUNTER — Encounter: Payer: Self-pay | Admitting: Cardiology

## 2012-09-02 ENCOUNTER — Ambulatory Visit (INDEPENDENT_AMBULATORY_CARE_PROVIDER_SITE_OTHER): Payer: Medicare Other | Admitting: Cardiology

## 2012-09-02 ENCOUNTER — Ambulatory Visit (HOSPITAL_COMMUNITY): Payer: Medicare Other

## 2012-09-02 VITALS — BP 104/50 | HR 60 | Ht 62.0 in | Wt 157.4 lb

## 2012-09-02 DIAGNOSIS — C9110 Chronic lymphocytic leukemia of B-cell type not having achieved remission: Secondary | ICD-10-CM

## 2012-09-02 DIAGNOSIS — I5040 Unspecified combined systolic (congestive) and diastolic (congestive) heart failure: Secondary | ICD-10-CM

## 2012-09-02 DIAGNOSIS — R0602 Shortness of breath: Secondary | ICD-10-CM | POA: Insufficient documentation

## 2012-09-02 DIAGNOSIS — I10 Essential (primary) hypertension: Secondary | ICD-10-CM

## 2012-09-02 DIAGNOSIS — E785 Hyperlipidemia, unspecified: Secondary | ICD-10-CM

## 2012-09-02 DIAGNOSIS — I7090 Unspecified atherosclerosis: Secondary | ICD-10-CM

## 2012-09-02 DIAGNOSIS — Z950 Presence of cardiac pacemaker: Secondary | ICD-10-CM

## 2012-09-02 DIAGNOSIS — I509 Heart failure, unspecified: Secondary | ICD-10-CM

## 2012-09-02 DIAGNOSIS — Z7901 Long term (current) use of anticoagulants: Secondary | ICD-10-CM

## 2012-09-02 DIAGNOSIS — I359 Nonrheumatic aortic valve disorder, unspecified: Secondary | ICD-10-CM

## 2012-09-02 DIAGNOSIS — R262 Difficulty in walking, not elsewhere classified: Secondary | ICD-10-CM

## 2012-09-02 DIAGNOSIS — I2510 Atherosclerotic heart disease of native coronary artery without angina pectoris: Secondary | ICD-10-CM

## 2012-09-02 DIAGNOSIS — N182 Chronic kidney disease, stage 2 (mild): Secondary | ICD-10-CM

## 2012-09-02 DIAGNOSIS — I350 Nonrheumatic aortic (valve) stenosis: Secondary | ICD-10-CM

## 2012-09-02 DIAGNOSIS — I5033 Acute on chronic diastolic (congestive) heart failure: Secondary | ICD-10-CM

## 2012-09-02 LAB — CBC
MCH: 31 pg (ref 26.0–34.0)
MCHC: 33.2 g/dL (ref 30.0–36.0)
MCV: 93.4 fL (ref 78.0–100.0)
Platelets: 192 10*3/uL (ref 150–400)
RBC: 2.74 MIL/uL — ABNORMAL LOW (ref 3.87–5.11)
RDW: 14 % (ref 11.5–15.5)

## 2012-09-02 NOTE — Patient Instructions (Addendum)
Your physician recommends that you schedule a follow-up appointment in:6 MONTHS WITH DR K  WE WILL SCHEDULE YOU AN APPOINTMENT FOR VALVE CLINIC WITH DR. Excell Seltzer A staff member from our office will alert you the with appointment date and time, once available  A chest x-ray takes a picture of the organs and structures inside the chest, including the heart, lungs, and blood vessels. This test can show several things, including, whether the heart is enlarges; whether fluid is building up in the lungs; and whether pacemaker / defibrillator leads are still in place.  Your Physician recommends that you complete the Hemoccult package given to you today, instructions are enclosed in your packet. Once completed please return to this office.  Your physician recommends that you return for lab work in: TODAY (SLIPS GIVEN FOR CBC,CMET,TSH)

## 2012-09-02 NOTE — Assessment & Plan Note (Signed)
Patient has somewhat atypical symptoms, but are likely the result of her aortic valve disease. Current lifestyle is unacceptable to her, and she would be willing to undergo surgical aortic valve replacement or TAVR if that would allow her to return to her usual active lifestyle and sense of well-being. Chest x-ray and basic laboratory testing will be performed to exclude easily reversible causes for her symptoms. She has had a moderately severe anemia, presumably related to CLL, that certainly could be a factor. In the absence of any significant new abnormalities, she'll be referred to our valve clinic.

## 2012-09-02 NOTE — Progress Notes (Deleted)
Name: [name]    DOB: 08-Feb-1916  Age: 79 y.o.  MR#: [id number]       PCP:  [name], MD      Insurance: Payor: MEDICARE / Plan: MEDICARE PART A AND B / Product Type: *No Product type* /   CC:   No chief complaint on file.  BOTTLES VS Filed Vitals:   09/02/12 1129  BP: 104/50  Pulse: 60  Height: 5\' 2"  (1.575 m)  Weight: 157 lb 6.4 oz (71.396 kg)    Weights Current Weight  09/02/12 157 lb 6.4 oz (71.396 kg)  07/20/12 155 lb 6.4 oz (70.489 kg)  04/26/12 147 lb (66.679 kg)    Blood Pressure  BP Readings from Last 3 Encounters:  09/02/12 104/50  07/20/12 110/68  04/26/12 120/63     Admit date:  (Not on file) Last encounter with RMR:  06/02/2012   Allergy Detrol; Sanctura; Sulfa antibiotics; and Sulfonamide derivatives  Current Outpatient Prescriptions  Medication Sig Dispense Refill  . acetaminophen (TYLENOL) 500 MG tablet Take 1,000 mg by mouth 4 (four) times daily.        [name] albuterol (PROVENTIL HFA;VENTOLIN HFA) 108 (90 BASE) MCG/ACT inhaler Inhale 2 puffs into the lungs every 6 (six) hours as needed for wheezing.      . calcium-vitamin D (OSCAL WITH D) 500-200 MG-UNIT per tablet Take 1 tablet by mouth every morning.       [name] losartan (COZAAR) 100 MG tablet Take 100 mg by mouth daily.      . metoprolol tartrate (LOPRESSOR) 25 MG tablet Take 1 tablet (25 mg total) by mouth 2 (two) times daily.  180 tablet  3  . Multiple Vitamins-Minerals (MULTIVITAMIN WITH MINERALS) tablet Take 1 tablet by mouth every morning.       . nitroGLYCERIN (NITROSTAT) 0.4 MG SL tablet Place 1 tablet (0.4 mg total) under the tongue every 5 (five) minutes as needed for chest pain.  25 tablet  11  . pravastatin (PRAVACHOL) 40 MG tablet Take 40 mg by mouth at bedtime.       . torsemide (DEMADEX) 20 MG tablet Take 20 mg by mouth daily.      . verapamil (CALAN-SR) 180 MG CR tablet Take 1 tablet (180 mg total) by mouth at bedtime.  90 tablet  3  . warfarin (COUMADIN) 2.5 MG tablet Take 1.25-2.5  mg by mouth every evening. 2.5 mg daily except she takes 1/2 tablet on thursdays       No current facility-administered medications for this visit.   Facility-Administered Medications Ordered in Other Visits  Medication Dose Route Frequency Provider Last Rate Last Dose  . epoetin alfa (EPOGEN,PROCRIT) injection 40,000 Units  40,000 Units Subcutaneous Once Randall An, MD        Discontinued Meds:    Medications Discontinued During This Encounter  Medication Reason  . olmesartan (BENICAR) 40 MG tablet Error    Patient Active Problem List   Diagnosis Date Noted  . Falls 08/17/2012  . Difficulty in walking(719.7) 08/17/2012  . Pneumonia, organism unspecified 04/06/2012  . Pacemaker 10/15/2011  . Transient blindness 09/14/2011  . Paroxysmal atrial fibrillation 09/14/2011  . Aortic stenosis   . Left bundle branch block   . Congestive heart failure   . Chronic kidney disease   . Gastroesophageal reflux disease   . Chronic lymphocytic leukemia   . Chronic anticoagulation 06/10/2010  . Arteriosclerotic cardiovascular disease (ASCVD) 03/22/2009  . HYPERLIPIDEMIA 12/17/2008  . HYPERTENSION 12/17/2008  .  Sick sinus syndrome 11/15/2006    LABS    Component Value Date/Time   NA 139 04/14/2012 0345   NA 139 04/08/2012 0336   NA 140 04/07/2012 0336   K 4.5 04/14/2012 0345   K 4.4 04/08/2012 0336   K 4.5 04/07/2012 0336   CL 105 04/14/2012 0345   CL 103 04/08/2012 0336   CL 104 04/07/2012 0336   CO2 21 04/14/2012 0345   CO2 29 04/08/2012 0336   CO2 28 04/07/2012 0336   GLUCOSE 123* 04/14/2012 0345   GLUCOSE 107* 04/08/2012 0336   GLUCOSE 108* 04/07/2012 0336   BUN 43* 04/14/2012 0345   BUN 45* 04/08/2012 0336   BUN 43* 04/07/2012 0336   CREATININE 1.50* 04/14/2012 0345   CREATININE 1.59* 04/08/2012 0336   CREATININE 1.64* 04/07/2012 0336   CREATININE 1.25* 04/05/2012 1550   CREATININE 1.27* 11/04/2011 1811   CREATININE 1.16* 04/27/2011 1530   CALCIUM 9.6 04/14/2012 0345   CALCIUM 9.2  04/08/2012 0336   CALCIUM 9.2 04/07/2012 0336   GFRNONAA 29* 04/14/2012 0345   GFRNONAA 27* 04/08/2012 0336   GFRNONAA 26* 04/07/2012 0336   GFRAA 33* 04/14/2012 0345   GFRAA 31* 04/08/2012 0336   GFRAA 30* 04/07/2012 0336   CMP     Component Value Date/Time   NA 139 04/14/2012 0345   K 4.5 04/14/2012 0345   CL 105 04/14/2012 0345   CO2 21 04/14/2012 0345   GLUCOSE 123* 04/14/2012 0345   BUN 43* 04/14/2012 0345   CREATININE 1.50* 04/14/2012 0345   CREATININE 1.25* 04/05/2012 1550   CALCIUM 9.6 04/14/2012 0345   PROT 7.1 04/05/2012 1919   ALBUMIN 3.9 04/05/2012 1919   AST 23 04/05/2012 1919   ALT 20 04/05/2012 1919   ALKPHOS 104 04/05/2012 1919   BILITOT 0.3 04/05/2012 1919   GFRNONAA 29* 04/14/2012 0345   GFRAA 33* 04/14/2012 0345       Component Value Date/Time   WBC 18.8* 07/19/2012 1006   WBC 18.7* 04/14/2012 0345   WBC 13.7* 04/08/2012 0336   HGB 9.6* 07/19/2012 1006   HGB 11.5* 04/14/2012 0345   HGB 9.9* 04/08/2012 0336   HCT 29.2* 07/19/2012 1006   HCT 36.7 04/14/2012 0345   HCT 30.6* 04/08/2012 0336   MCV 98.0 07/19/2012 1006   MCV 97.6 04/14/2012 0345   MCV 96.5 04/08/2012 0336    Lipid Panel     Component Value Date/Time   CHOL 145 05/26/2011 0855   TRIG 186* 05/26/2011 0855   HDL 37* 05/26/2011 0855   CHOLHDL 3.9 05/26/2011 0855   VLDL 37 05/26/2011 0855   LDLCALC 71 05/26/2011 0855    ABG    Component Value Date/Time   PHART 7.408* 10/04/2006 0217   PCO2ART 39.6 10/04/2006 0217   PO2ART 85.2 10/04/2006 0217   HCO3 24.6* 10/04/2006 0217   O2SAT 96.3 10/04/2006 0217     Lab Results  Component Value Date   TSH 2.659 04/27/2011   BNP (last 3 results)  Recent Labs  04/05/12 0350 04/05/12 1919 04/14/12 0345  PROBNP 1475.0* 1598.0* 442.2   Cardiac Panel (last 3 results) No results found for this basename: CKTOTAL, CKMB, TROPONINI, RELINDX,  in the last 72 hours  Iron/TIBC/Ferritin    Component Value Date/Time   IRON 49 05/01/2011 1510   TIBC 276 05/01/2011 1510   FERRITIN 178  05/01/2011 1510     EKG Orders placed in visit on 04/26/12  . EKG 12-LEAD     Prior  Assessment and Plan Problem List as of 09/02/2012   HYPERLIPIDEMIA   Last Assessment & Plan   01/28/2012 Office Visit Edited 01/29/2012 10:06 AM by Kathlen Brunswick, MD     Total and LDL cholesterol were extremely low in the profile performed 08/2009, which is the most recent test available to me.  It is doubtful there has been a significant increase, and the benefit pharmacologic treatment at this advanced age is somewhat uncertain.  Current therapy will be continued without further testing.    HYPERTENSION   Last Assessment & Plan   04/26/2012 Office Visit Written 04/26/2012  2:09 PM by Jodelle Gross, NP     Blood pressure is well controlled with change to Benicar from lisinopril. Coughing improved temporarily, but she is now having chest congestion with another round of antibiotics per Dr. Juanetta Gosling. Will keep her off the ACE inhibitor to keep from confusing her cough with use of ACE.     Arteriosclerotic cardiovascular disease (ASCVD)   Last Assessment & Plan   01/28/2012 Office Visit Edited 01/28/2012  2:28 PM by Kathlen Brunswick, MD     Current symptoms could be related to progression of coronary disease, but patient is doing well with medical therapy, which will be continued.    Chronic anticoagulation   Last Assessment & Plan   01/28/2012 Office Visit Written 01/28/2012  2:36 PM by Kathlen Brunswick, MD     Most recent INR performed 2 weeks ago was subtherapeutic; continued monitoring and adjustment of warfarin dosage will be undertaken in our anticoagulation clinic.    Sick sinus syndrome   Last Assessment & Plan   11/19/2011 Office Visit Written 11/20/2011  8:59 AM by Kathlen Brunswick, MD     Paroxysmal atrial fibrillation; dual-chamber pacemaker in 11/2006; Last evaluated by Dr. Ladona Ridgel in 10/2011-he will continue to follow her as appropriate.    Aortic stenosis   Last Assessment & Plan    04/26/2012 Office Visit Written 04/26/2012  2:11 PM by Jodelle Gross, NP     Will repeat her echo in June for annual assessment. She has chronic dyspnea with COPD, and doubt moderate stenosis is contributing her her symptoms of shortness of breath at this time. Will see her in 6 months,    Left bundle branch block   Congestive heart failure   Last Assessment & Plan   04/26/2012 Office Visit Written 04/26/2012  2:11 PM by Jodelle Gross, NP     No evidence of fluid overload on this assessment. Continue current medical regimen.    Chronic kidney disease   Last Assessment & Plan   11/19/2011 Office Visit Written 11/20/2011  8:54 AM by Kathlen Brunswick, MD     Renal function remains stable and age appropriate.      Gastroesophageal reflux disease   Chronic lymphocytic leukemia   Last Assessment & Plan   04/27/2011 Office Visit Written 04/27/2011  5:36 PM by Kathlen Brunswick, MD     White count has been stable and not markedly elevated, most recently 16,200.  Dr. Mariel Sleet continues to follow this problem, which has not required much treatment.    Transient blindness   Last Assessment & Plan   09/30/2011 Office Visit Edited 09/30/2011 11:42 AM by Dyann Kief, PA     Resolved and without recurrence    Paroxysmal atrial fibrillation   Last Assessment & Plan   04/05/2012 Office Visit Written 04/05/2012  4:49 PM by Jodelle Gross,  NP     She remains in NSR and on anticoagulation. Labs will evaluate for anemia causing symptoms of dyspnea. Will review when available.    Pacemaker   Last Assessment & Plan   10/15/2011 Office Visit Written 10/15/2011 11:41 AM by Marinus Maw, MD     Her device is working normally. We'll plan to recheck in several months. Today we reprogrammed her device from DD IR to DDDR.    Pneumonia, organism unspecified   Falls   Difficulty in walking(719.7)       Imaging: No results found.

## 2012-09-02 NOTE — Assessment & Plan Note (Signed)
Creatinine has been normal in recent years. Unless renal function has worsened, I doubt her anemia is attributable to renal problems.

## 2012-09-02 NOTE — Assessment & Plan Note (Deleted)
Patient does not appear to have active congestive heart failure at present. This will be reassessed with a chest x-ray and BNP level. 

## 2012-09-02 NOTE — Assessment & Plan Note (Signed)
Rare blood pressure elevations over the past year. Current therapy is adequate.

## 2012-09-02 NOTE — Progress Notes (Signed)
Patient ID: [name], female   DOB: December 22, 1915, 79 y.o.   MRN: [id number]  HPI: Schedule return visit for reassessment of aortic stenosis. [name] is a remarkably intact 79 year old woman who continues to drive and live independently. She gardens with the assistance of female friends who do the physical labor. Over the past 2 months, she has noted excessive fatigue and malaise and has not been inclined to do much of anything physical. She experiences dyspnea, mostly with mild to moderate exertion. She has had episodes of lightheadedness and one of near syncope. She denies chest discomfort. She has not noted significant edema.  Current Outpatient Prescriptions  Medication Sig Dispense Refill  . acetaminophen (TYLENOL) 500 MG tablet Take 1,000 mg by mouth 4 (four) times daily.        [name] albuterol (PROVENTIL HFA;VENTOLIN HFA) 108 (90 BASE) MCG/ACT inhaler Inhale 2 puffs into the lungs every 6 (six) hours as needed for wheezing.      . calcium-vitamin D (OSCAL WITH D) 500-200 MG-UNIT per tablet Take 1 tablet by mouth every morning.       [name] losartan (COZAAR) 100 MG tablet Take 100 mg by mouth daily.      . metoprolol tartrate (LOPRESSOR) 25 MG tablet Take 1 tablet (25 mg total) by mouth 2 (two) times daily.  180 tablet  3  . Multiple Vitamins-Minerals (MULTIVITAMIN WITH MINERALS) tablet Take 1 tablet by mouth every morning.       . nitroGLYCERIN (NITROSTAT) 0.4 MG SL tablet Place 1 tablet (0.4 mg total) under the tongue every 5 (five) minutes as needed for chest pain.  25 tablet  11  . pravastatin (PRAVACHOL) 40 MG tablet Take 40 mg by mouth at bedtime.       . torsemide (DEMADEX) 20 MG tablet Take 20 mg by mouth daily.      . verapamil (CALAN-SR) 180 MG CR tablet Take 1 tablet (180 mg total) by mouth at bedtime.  90 tablet  3  . warfarin (COUMADIN) 2.5 MG tablet Take 1.25-2.5 mg by mouth every evening. 2.5 mg daily except she takes 1/2 tablet on thursdays       No current facility-administered  medications for this visit.   Facility-Administered Medications Ordered in Other Visits  Medication Dose Route Frequency Provider Last Rate Last Dose  . epoetin alfa (EPOGEN,PROCRIT) injection 40,000 Units  40,000 Units Subcutaneous Once Randall An, MD       Allergies  Allergen Reactions  . Detrol (Tolterodine Tartrate) Other (See Comments)    Patient states: "I was told to never take it again, I do not recall the reaction"  . Sanctura (Trospium Chloride) Nausea Only and Other (See Comments)    Intense lower abd pain  . Sulfa Antibiotics Other (See Comments)    Extreme dizziness  . Sulfonamide Derivatives Other (See Comments)    REACTION: GI distress     Past medical history, social history, and family history reviewed and updated.  ROS: Denies orthopnea, PND, falls, melena. She has not noted weight loss nor weight gain. All other systems reviewed and are negative.  PHYSICAL EXAM: BP 104/50  Pulse 60  Ht 5\' 2"  (1.575 m)  Wt 71.396 kg (157 lb 6.4 oz)  BMI 28.78 kg/m2;  Body mass index is 28.78 kg/(m^2). General-Well developed; no acute distress Body habitus-mildly overweight Neck-No JVD; transmitted murmur to the carotids bilaterally with mild delay in carotid upstrokes Lungs-clear lung fields; resonant to percussion; mild kyphosis Cardiovascular-normal PMI; normal S1;  minimal 82; wheezing grade 3/6 systolic ejection murmur with a mid to late peak Abdomen-normal bowel sounds; soft and non-tender without masses or organomegaly Musculoskeletal-No deformities, no cyanosis or clubbing Neurologic-Normal cranial nerves; symmetric strength and tone Skin-Warm, no significant lesions Extremities-distal pulses intact; trace ankle edema  [name], MD 09/02/2012  1:34 PM  ASSESSMENT AND PLAN

## 2012-09-02 NOTE — Assessment & Plan Note (Signed)
Progressive coronary artery disease could also be a factor in her current symptoms. She will require cardiac catheterization to determine optimal treatment.

## 2012-09-02 NOTE — Assessment & Plan Note (Signed)
She has done well with anticoagulation despite progressive anemia. We will reassess a CBC, iron studies and FOBT.

## 2012-09-02 NOTE — Assessment & Plan Note (Signed)
Chronic lymphocytic leukemia is a factor in determining what treatment for her valvular heart disease would be reasonable. We will coordinate our recommendations with Dr. Thornton Papas assessment.

## 2012-09-02 NOTE — Assessment & Plan Note (Signed)
Patient does not appear to have active congestive heart failure at present. This will be reassessed with a chest x-ray and BNP level.

## 2012-09-02 NOTE — Assessment & Plan Note (Signed)
Fasting lipid profile will be repeated.

## 2012-09-03 LAB — COMPREHENSIVE METABOLIC PANEL
ALT: 10 U/L (ref 0–35)
AST: 16 U/L (ref 0–37)
Alkaline Phosphatase: 80 U/L (ref 39–117)
CO2: 26 mEq/L (ref 19–32)
Creat: 2.07 mg/dL — ABNORMAL HIGH (ref 0.50–1.10)
Sodium: 145 mEq/L (ref 135–145)
Total Bilirubin: 0.4 mg/dL (ref 0.3–1.2)
Total Protein: 6.1 g/dL (ref 6.0–8.3)

## 2012-09-05 ENCOUNTER — Ambulatory Visit (INDEPENDENT_AMBULATORY_CARE_PROVIDER_SITE_OTHER): Payer: Medicare Other | Admitting: *Deleted

## 2012-09-05 ENCOUNTER — Encounter: Payer: Self-pay | Admitting: *Deleted

## 2012-09-05 ENCOUNTER — Encounter: Payer: Self-pay | Admitting: Cardiology

## 2012-09-05 DIAGNOSIS — Z7901 Long term (current) use of anticoagulants: Secondary | ICD-10-CM

## 2012-09-05 LAB — POC HEMOCCULT BLD/STL (HOME/3-CARD/SCREEN)
Card #2 Fecal Occult Blod, POC: NEGATIVE
Card #3 Fecal Occult Blood, POC: NEGATIVE

## 2012-09-06 ENCOUNTER — Encounter: Payer: Self-pay | Admitting: *Deleted

## 2012-09-06 NOTE — Addendum Note (Signed)
Addended by: Thompson Grayer on: 09/06/2012 04:35 PM   Modules accepted: Orders

## 2012-09-07 LAB — BASIC METABOLIC PANEL
BUN: 60 mg/dL — ABNORMAL HIGH (ref 6–23)
CO2: 25 mEq/L (ref 19–32)
Chloride: 110 mEq/L (ref 96–112)
Creat: 1.59 mg/dL — ABNORMAL HIGH (ref 0.50–1.10)
Potassium: 4.4 mEq/L (ref 3.5–5.3)

## 2012-09-07 LAB — LIPID PANEL
Cholesterol: 83 mg/dL (ref 0–200)
Triglycerides: 126 mg/dL (ref <150)
VLDL: 25 mg/dL (ref 0–40)

## 2012-09-08 ENCOUNTER — Encounter: Payer: Self-pay | Admitting: Cardiology

## 2012-09-09 ENCOUNTER — Encounter: Payer: Self-pay | Admitting: *Deleted

## 2012-09-12 ENCOUNTER — Ambulatory Visit (INDEPENDENT_AMBULATORY_CARE_PROVIDER_SITE_OTHER): Payer: Medicare Other | Admitting: *Deleted

## 2012-09-12 DIAGNOSIS — Z7901 Long term (current) use of anticoagulants: Secondary | ICD-10-CM

## 2012-09-13 ENCOUNTER — Encounter: Payer: Self-pay | Admitting: *Deleted

## 2012-09-14 ENCOUNTER — Encounter (HOSPITAL_COMMUNITY): Payer: Medicare Other

## 2012-09-14 ENCOUNTER — Encounter: Payer: Self-pay | Admitting: Cardiovascular Disease

## 2012-09-14 ENCOUNTER — Ambulatory Visit (INDEPENDENT_AMBULATORY_CARE_PROVIDER_SITE_OTHER): Payer: Medicare Other | Admitting: Cardiovascular Disease

## 2012-09-14 VITALS — BP 133/49 | HR 72 | Ht 60.0 in | Wt 155.0 lb

## 2012-09-14 DIAGNOSIS — I350 Nonrheumatic aortic (valve) stenosis: Secondary | ICD-10-CM

## 2012-09-14 DIAGNOSIS — I10 Essential (primary) hypertension: Secondary | ICD-10-CM

## 2012-09-14 DIAGNOSIS — I359 Nonrheumatic aortic valve disorder, unspecified: Secondary | ICD-10-CM

## 2012-09-14 NOTE — Patient Instructions (Addendum)
Ryan (Dr Orvan July nurse) will contact you with an appointment.   Your physician recommends that you continue on your current medications as directed. Please refer to the Current Medication list given to you today.

## 2012-09-15 ENCOUNTER — Other Ambulatory Visit: Payer: Self-pay

## 2012-09-15 DIAGNOSIS — I359 Nonrheumatic aortic valve disorder, unspecified: Secondary | ICD-10-CM

## 2012-09-17 ENCOUNTER — Other Ambulatory Visit: Payer: Self-pay | Admitting: Cardiology

## 2012-09-18 ENCOUNTER — Encounter: Payer: Self-pay | Admitting: Cardiovascular Disease

## 2012-09-18 NOTE — Progress Notes (Signed)
HPI:  79 year-old woman referred for evaluation of aortic stenosis. She has been followed for many years by Dr Dietrich Pates, and recently has developed symptoms and noninvasive findings suggestive of progressive aortic aortic stenosis. She has no history of CAD or MI, but was hospitalized with CHF in January 2014. She also has background cardiac problems including sick sinus syndrome s/p pacemaker placement, PAF on chronic warfarin, and nonobstructive CAD (cath in 2009 for chest pain).  She has developed symptoms of exertional dyspnea and lightheadedness/near syncope (occurs at rest and has been associated with exertion). She remains remarkably independent, living alone and continues to drive. She describes 'good days and bad days,' but lately has more frequent dyspnea and is increasingly limited by this. Since her hospitalization in January she has had no further orthopnea, PND, or leg swelling. She denies chest pain or pressure.   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 She's been widowed since 1992 and was married for over 50 years. She had one son who died of Hodgkin's Lymphoma and another son who lives nearby. She's a lifelong nonsmoker. She's been diagnosed with CLL but apparently has required no treatment. She received Procrit injections but hasn't needed them in several months.  Outpatient Encounter Prescriptions as of 09/14/2012  Medication Sig Dispense Refill  . acetaminophen (TYLENOL) 500 MG tablet Take 1,000 mg by mouth 4 (four) times daily.        [name] albuterol (PROVENTIL HFA;VENTOLIN HFA) 108 (90 BASE) MCG/ACT inhaler Inhale 2 puffs into the lungs every 6 (six) hours as needed for wheezing.      . calcium-vitamin D (OSCAL WITH D) 500-200 MG-UNIT per tablet Take 1 tablet by mouth every morning.       [name] losartan (COZAAR) 100 MG tablet Take 100 mg by mouth daily.      . metoprolol tartrate (LOPRESSOR) 25 MG tablet Take 1 tablet (25 mg total) by mouth 2 (two) times daily.  180 tablet  3    . Multiple Vitamins-Minerals (MULTIVITAMIN WITH MINERALS) tablet Take 1 tablet by mouth every morning.       . nitroGLYCERIN (NITROSTAT) 0.4 MG SL tablet Place 1 tablet (0.4 mg total) under the tongue every 5 (five) minutes as needed for chest pain.  25 tablet  11  . pravastatin (PRAVACHOL) 40 MG tablet Take 40 mg by mouth at bedtime.       . torsemide (DEMADEX) 20 MG tablet Take 20 mg by mouth daily.      . verapamil (CALAN-SR) 180 MG CR tablet Take 1 tablet (180 mg total) by mouth at bedtime.  90 tablet  3  . warfarin (COUMADIN) 2.5 MG tablet Take 1.25-2.5 mg by mouth every evening. 2.5 mg daily except she takes 1/2 tablet on thursdays       Facility-Administered Encounter Medications as of 09/14/2012  Medication Dose Route Frequency Provider Last Rate Last Dose  . epoetin alfa (EPOGEN,PROCRIT) injection 40,000 Units  40,000 Units Subcutaneous Once Randall An, MD        Detrol; [place]; Sulfa antibiotics; and Sulfonamide derivatives  Past Medical History  Diagnosis Date  . Sick sinus syndrome 11/2006    paroxysmal atrial fibrillation; dual-chamber pacemaker in 11/2006  . Aortic stenosis     mild  . Left bundle branch block   . Congestive heart failure     Congestive heart failure with normal ejection fraction  . Arteriosclerotic cardiovascular disease (ASCVD)     coronary angio in 10/09:50% left  anterior descending; 80% PDA; medical therapy advisedOrthostatic decrease in blood pressure  . Epistaxis     mild with negative ENT evaluation  . Dizziness     chronic  . Osteoarthritis   . Irritable bowel syndrome   . Urinary frequency     with incontinence  . Gastroesophageal reflux disease     Esophageal dilatation for stricture in 09/2011  . Rosacea   . Chronic lymphocytic leukemia     Stage I with anemia H./H. of 9/27 in 11/09 with normal MCV; iron deficiency in bone marrow  . Orthostatic hypotension   . Chronic anticoagulation   . Diverticulosis   . Adenomatous polyp  October 2010    (Proximal Small bowel) With high grade dysplasia in polypoid lesion straddling D1/D2  . Chronic kidney disease      Creatinine of 1.7 in 8/08, 1.4 in 10/08 and 1.5 in 11/09    Past Surgical History  Procedure Laterality Date  . Cholecystectomy    . Cervical spine surgery    . Lumbar disc surgery    . Bladder suspension    . Abdominal hysterectomy  1972  . Cataract extraction      Bilateral  . Esophagogastroduodenoscopy  01/09/09    Rourk -noncritical Schatzki's ring, small hiatal hernia, fundal gland polyps, bile-stained gastric mucosa, polypoid lesion straddling D1 and D2 1-2 cm, localized lymphangitic-appearing mucosa at D2 biopsied (adenomatous polyp with high grade glandular dysplasia  . A-v cardiac pacemaker insertion  11/2006    [employer]    History   Social History  . Marital Status: Widowed    Spouse Name: N/A    Number of Children: N/A  . Years of Education: N/A   Occupational History  . retired    Social History Main Topics  . Smoking status: Never Smoker   . Smokeless tobacco: Never Used  . Alcohol Use: No  . Drug Use: No  . Sexually Active: No   Other Topics Concern  . Not on file   Social History Narrative  . No narrative on file    Family History  Problem Relation Age of Onset  . Heart failure Father   . Cancer Father   . Diabetes Mother     ROS:  General: no fevers/chills/night sweats Eyes: no blurry vision, diplopia, or amaurosis ENT: no sore throat or hearing loss Resp: no cough, wheezing, or hemoptysis CV: no edema or palpitations, otherwise see HPI GI: no abdominal pain, nausea, vomiting, diarrhea, or constipation GU: no dysuria, frequency, or hematuria Skin: no rash Neuro: no headache, numbness, tingling, or weakness of extremities, positive for gait unsteadiness Musculoskeletal: mild hip and knee pain with ambulation Heme: no bleeding, DVT, or easy bruising Endo: no polydipsia or polyuria  BP 133/49  Pulse 72  Ht  5' (1.524 m)  Wt 70.308 kg (155 lb)  BMI 30.27 kg/m2  PHYSICAL EXAM: Pt is alert and oriented, WD, WN, pleasant elderly woman in no distress. HEENT: normal Neck: JVP normal. Carotid upstrokes normal with bilateral bruits. No thyromegaly. Lungs: equal expansion, clear bilaterally CV: Apex is discrete and nondisplaced, RRR with grade 3/6 late-peaking crescendo-decrescendo murmur at the RUSB, diminished A2 Abd: soft, NT, +BS, no bruit, no hepatosplenomegaly Back: no CVA tenderness Ext: no C/C/E        Femoral pulses 2+=         DP/PT pulses intact and = Skin: warm and dry without rash Neuro: CNII-XII intact  Strength intact = bilaterally  EKG:  Electronic ventricular pacemaker 65 bpm  2D Echo 08/23/2012: Left ventricle: The cavity size was normal. There was moderate concentric hypertrophy. Systolic function was normal. The estimated ejection fraction was in the range of 60% to 65%. Wall motion was normal; there were no regional wall motion abnormalities.  ------------------------------------------------------------ Aortic valve: Mildly to moderately calcified annulus. Moderately thickened, moderately calcified leaflets. Cusp separation was moderately reduced. Doppler: There was moderate stenosis. Mild regurgitation. VTI ratio of LVOT to aortic valve: 0.29. Valve area: 0.51cm^2(VTI). Indexed valve area: 0.31cm^2/m^2 (VTI). Peak velocity ratio of LVOT to aortic valve: 0.29. Valve area: 0.51cm^2 (Vmax). Indexed valve area: 0.31cm^2/m^2 (Vmax). Mean gradient: 25mm Hg (S). Peak gradient: 40mm Hg (S).  ------------------------------------------------------------ Aorta: Aortic root: The aortic root was normal in size. Ascending aorta: The ascending aorta was normal in size.  ------------------------------------------------------------ Mitral valve: Calcified, moderately to severely thickened annulus. Leaflet separation was normal. Doppler: Transvalvular velocity was  within the normal range. There was no evidence for stenosis. Mild regurgitation.  ------------------------------------------------------------ Left atrium: The atrium was moderately dilated.  ------------------------------------------------------------ Atrial septum: No defect or patent foramen ovale was identified.  ------------------------------------------------------------ Right ventricle: The cavity size was normal. Wall thickness was mildly increased. Pacer wire or catheter noted in right ventricle. Systolic function was hyperdynamic.  ------------------------------------------------------------ Pulmonic valve: Structurally normal valve. Cusp separation was normal. Doppler: Transvalvular velocity was within the normal range. No regurgitation.  ------------------------------------------------------------ Tricuspid valve: Structurally normal valve. Leaflet separation was normal. Doppler: Transvalvular velocity was within the normal range. Mild regurgitation.  ------------------------------------------------------------ Pulmonary artery: The main pulmonary artery was normal-sized. Systolic pressure was mildly increased.  ------------------------------------------------------------ Right atrium: The atrium was mildly to moderately dilated.  ------------------------------------------------------------ Pericardium: There was no pericardial effusion.  ------------------------------------------------------------ Systemic veins: Inferior vena cava: The vessel was normal in size; the respirophasic diameter changes were in the normal range (= 50%); findings are consistent with normal central venous pressure.  ------------------------------------------------------------  2D measurements Normal Doppler measurements Norma Left ventricle l LVID ED, 43.8 mm 43-52 Main pulmonary artery chord, Pressure, 45 mm Hg =30 PLAX S LVID ES, 34.2 mm 23-38 LVOT chord, Peak vel, 91. cm/s  ----- PLAX S 6 FS, chord, 22 % >29 VTI, S 22. cm ----- PLAX 5 LVPW, ED 14.1 mm ------ Aortic valve IVS/LVPW 1.21 <1.3 Peak vel, 317 cm/s ----- ratio, ED S Ventricular septum Mean vel, 227 cm/s ----- IVS, ED 17 mm ------ S LVOT VTI, S 78. cm ----- Diam, S 15 mm ------ 8 Area 1.77 cm^2 ------ Mean 25 mm Hg ----- Aorta gradient, Root diam, 29 mm ------ S ED Peak 40 mm Hg ----- Left atrium gradient, AP dim 45 mm ------ S AP dim 2.69 cm/m^2 <2.2 VTI ratio 0.2 ----- index LVOT/AV 9 Area, VTI 0.5 cm^2 ----- M-mode measurements Normal 1 Aorta Area index 0.3 cm^2/m^2 ----- Root diam, 20 mm 20-37 (VTI) 1 ED Peak vel 0.2 ----- Left atrium ratio, 9 AP dim, ES 56 mm 19-40 LVOT/AV AP dim 3.35 cm/m^2 <2.2 Area, Vmax 0.5 cm^2 ----- index, ES 1 LA/Ao root 2.8 ------ Area index 0.3 cm^2/m^2 ----- ratio (Vmax) 1 Regurg PHT 398 ms ----- Mitral valve Regurg 38. cm/s ----- alias vel, 5 PISA Max regurg 535 cm/s ----- vel Regurg VTI 176 cm ----- ERO, PISA 0.0 cm^2 ----- 7 Regurg 12 ml ----- vol, PISA Tricuspid valve Regurg 294 cm/s ----- peak vel Peak RV-RA 35 mm Hg ----- gradient, S Systemic veins Estimated 10 mm Hg ----- CVP  Right ventricle Pressure, 45 mm Hg <30  Cardiac cath 12/21/2007: PROCEDURES PERFORMED:  1. Selective coronary angiography.  2. Left heart cath.  Left ventriculogram was not performed in an effort to limit the contrast  exposure.  DESCRIPTION OF PROCEDURE: The risks and indications of catheterization  were explained. Consent was signed and placed on the chart. A 5-French  arterial sheath was placed in the right femoral artery using a modified  Seldinger technique, standard catheters including a JL-4, JR-4, and  angled pigtail. All catheter exchanges were made over the wire. There  were no apparent complications. We did have some difficulty in crossing  the aortic valve, but there were able to use this after a few attempts  with the pigtail catheter and  a J-wire.  Central aortic pressure was 151/63 with a mean of 93. LV pressure  163/22 with an EDP of 28. There was no aortic stenosis.  Total contrast used was 70 mL.  Left main had mild calcification. There was no significant stenosis.  LAD was a long vessel coursing wrapping the apex. It gave off a large  first diagonal. The LAD was a bit hard to fill completely and visualize  due to the size and overlapping nature of the circumflex. There was a  40-50% tubular lesion in the proximal LAD followed by about 50-60%  lesion in the mid LAD after the first diagonal.  Left circumflex was a large dominant vessel. It gave off a moderate  size ramus, a small OM1, a large OM2, a moderate OM3, several  posterolaterals, and a PDA with angiographically normal.  Right coronary artery was a very small and nondominant vessel with an  80% lesion in the midsection.  ASSESSMENT:  1. Nonobstructive coronary artery disease.  2. Elevated left ventricular end-diastolic pressure consistent with  the patient's known history of diastolic heart failure.  PLAN/DISCUSSION: Given her coronary anatomy, I do not think her chest  pain is cardiac in nature. We will continue to treat her with medical  therapy. We will watch her renal function closely.  ASSESSMENT AND PLAN: 79 year-old woman with moderate/severe aortic stenosis. Comorbidities include NYHA Class 2-3 diastolic CHF, PAF on chronic anticoagulation, advanced age, HTN, and CLL. She is increasingly limited by symptoms as detailed above. Her physical exam suggests severe aortic stenosis, but echo is more suggestive of moderate AS (especially velocity measurements). Considering her progressive symptoms and discrepant data, I think cardiac catheterization is indicated. Will plan on right/left heart cath for full assessment. We have reviewed risks, indications, and alternatives to right and left heart cath and she agrees to proceed. Further plans pending cath results.  Will likely require evaluation in the [facility] by Dr Cornelius Moras. I have a lengthy discussion with the patient and her daughter-in-law today about the natural history of aortic stenosis, especially once symptoms arise. This discussion was placed in the context of her age and comorbid medical conditions. We will have to proceed very carefully as she has CKD and will plan on a limited contrast eval at the time of cardiac cath.  Tonny Bollman 09/18/2012 2:32 PM

## 2012-09-19 NOTE — Telephone Encounter (Signed)
Medication sent via escribe.  

## 2012-09-20 ENCOUNTER — Other Ambulatory Visit: Payer: Self-pay

## 2012-09-20 DIAGNOSIS — I359 Nonrheumatic aortic valve disorder, unspecified: Secondary | ICD-10-CM

## 2012-09-21 ENCOUNTER — Telehealth: Payer: Self-pay | Admitting: Cardiovascular Disease

## 2012-09-21 NOTE — Telephone Encounter (Signed)
Spoke with patient who states she did not receive her instructions for her upcoming catheterization on July 14.  I read the instructions out to the patient and asked her to write everything down.  The patient verbalized understanding and asked me appropriate questions in return.  I instructed patient to call us back if she has any further questions.  Patient verified that her last dose of Coumadin was yesterday, 7/8 and she plans to get her lab work done tomorrow 7/10.

## 2012-09-21 NOTE — Telephone Encounter (Signed)
New Problem:    Patient called in because she still has not received paperwork about her upcoming procedure on 09/26/12, would also like to know if her labs are fasting.  Please call back.

## 2012-09-22 ENCOUNTER — Encounter: Payer: Medicare Other | Admitting: Thoracic Surgery (Cardiothoracic Vascular Surgery)

## 2012-09-22 LAB — BASIC METABOLIC PANEL
BUN: 67 mg/dL — ABNORMAL HIGH (ref 6–23)
CO2: 24 mEq/L (ref 19–32)
Calcium: 8.8 mg/dL (ref 8.4–10.5)
Glucose, Bld: 122 mg/dL — ABNORMAL HIGH (ref 70–99)
Sodium: 141 mEq/L (ref 135–145)

## 2012-09-23 LAB — CBC WITH DIFFERENTIAL/PLATELET
Basophils Absolute: 0.1 10*3/uL (ref 0.0–0.1)
Basophils Relative: 1 % (ref 0–1)
Eosinophils Relative: 2 % (ref 0–5)
HCT: 26.9 % — ABNORMAL LOW (ref 36.0–46.0)
MCHC: 32 g/dL (ref 30.0–36.0)
MCV: 94.7 fL (ref 78.0–100.0)
Monocytes Absolute: 0.6 10*3/uL (ref 0.1–1.0)
Neutro Abs: 5 10*3/uL (ref 1.7–7.7)
Platelets: 226 10*3/uL (ref 150–400)
RDW: 14.1 % (ref 11.5–15.5)
WBC: 13.1 10*3/uL — ABNORMAL HIGH (ref 4.0–10.5)

## 2012-09-26 ENCOUNTER — Encounter (HOSPITAL_BASED_OUTPATIENT_CLINIC_OR_DEPARTMENT_OTHER)
Admission: RE | Disposition: A | Discharge status: Home / Self Care | Payer: Self-pay | Source: Ambulatory Visit | Attending: Cardiovascular Disease

## 2012-09-26 ENCOUNTER — Ambulatory Visit (INDEPENDENT_AMBULATORY_CARE_PROVIDER_SITE_OTHER): Payer: Medicare Other | Admitting: *Deleted

## 2012-09-26 ENCOUNTER — Encounter (HOSPITAL_BASED_OUTPATIENT_CLINIC_OR_DEPARTMENT_OTHER): Payer: Self-pay

## 2012-09-26 ENCOUNTER — Other Ambulatory Visit: Payer: Self-pay | Admitting: Cardiovascular Disease

## 2012-09-26 ENCOUNTER — Inpatient Hospital Stay (HOSPITAL_BASED_OUTPATIENT_CLINIC_OR_DEPARTMENT_OTHER)
Admission: RE | Admit: 2012-09-26 | Discharge: 2012-09-26 | Disposition: A | Discharge status: Home / Self Care | Payer: Medicare Other | Source: Ambulatory Visit | Attending: Cardiovascular Disease | Admitting: Cardiovascular Disease

## 2012-09-26 DIAGNOSIS — I495 Sick sinus syndrome: Secondary | ICD-10-CM | POA: Insufficient documentation

## 2012-09-26 DIAGNOSIS — Z7901 Long term (current) use of anticoagulants: Secondary | ICD-10-CM | POA: Insufficient documentation

## 2012-09-26 DIAGNOSIS — Z79899 Other long term (current) drug therapy: Secondary | ICD-10-CM | POA: Insufficient documentation

## 2012-09-26 DIAGNOSIS — I359 Nonrheumatic aortic valve disorder, unspecified: Secondary | ICD-10-CM | POA: Insufficient documentation

## 2012-09-26 DIAGNOSIS — I4891 Unspecified atrial fibrillation: Secondary | ICD-10-CM | POA: Insufficient documentation

## 2012-09-26 DIAGNOSIS — Z950 Presence of cardiac pacemaker: Secondary | ICD-10-CM | POA: Insufficient documentation

## 2012-09-26 DIAGNOSIS — I2510 Atherosclerotic heart disease of native coronary artery without angina pectoris: Secondary | ICD-10-CM | POA: Insufficient documentation

## 2012-09-26 LAB — POCT I-STAT 3, ART BLOOD GAS (G3+)
Acid-base deficit: 2 mmol/L (ref 0.0–2.0)
pH, Arterial: 7.39 (ref 7.350–7.450)

## 2012-09-26 LAB — POCT I-STAT 3, VENOUS BLOOD GAS (G3P V)
Bicarbonate: 21.8 mEq/L (ref 20.0–24.0)
pH, Ven: 7.316 — ABNORMAL HIGH (ref 7.250–7.300)
pO2, Ven: 30 mmHg (ref 30.0–45.0)

## 2012-09-26 SURGERY — JV LEFT AND RIGHT HEART CATHETERIZATION WITH CORONARY ANGIOGRAM

## 2012-09-26 MED ORDER — SODIUM CHLORIDE 0.9 % IV SOLN: 1.0000 mL/kg/h | INTRAVENOUS | Status: DC

## 2012-09-26 MED ORDER — SODIUM CHLORIDE 0.9 % IV SOLN: 250.0000 mL | INTRAVENOUS | Status: DC | PRN

## 2012-09-26 MED ORDER — DIAZEPAM 5 MG PO TABS
5.0000 mg | ORAL_TABLET | ORAL | Status: AC
  Administered 2012-09-26: 5 mg via ORAL

## 2012-09-26 MED ORDER — SODIUM CHLORIDE 0.9 % IJ SOLN: 3.0000 mL | Freq: Two times a day (BID) | INTRAMUSCULAR | Status: DC

## 2012-09-26 MED ORDER — ONDANSETRON HCL 4 MG/2ML IJ SOLN: 4.0000 mg | Freq: Four times a day (QID) | INTRAMUSCULAR | Status: DC | PRN

## 2012-09-26 MED ORDER — ACETAMINOPHEN 325 MG PO TABS: 650.0000 mg | ORAL_TABLET | ORAL | Status: DC | PRN

## 2012-09-26 MED ORDER — SODIUM CHLORIDE 0.9 % IJ SOLN: 3.0000 mL | INTRAMUSCULAR | Status: DC | PRN

## 2012-09-26 MED ORDER — ASPIRIN 81 MG PO CHEW
324.0000 mg | CHEWABLE_TABLET | ORAL | Status: AC
  Administered 2012-09-26: 324 mg via ORAL

## 2012-09-26 MED ORDER — SODIUM BICARBONATE BOLUS VIA INFUSION
INTRAVENOUS | Status: AC
  Administered 2012-09-26: 11:00:00 via INTRAVENOUS

## 2012-09-26 MED ORDER — SODIUM BICARBONATE 8.4 % IV SOLN
INTRAVENOUS | Status: DC
  Filled 2012-09-26: qty 1000

## 2012-09-26 NOTE — H&P (View-Only) (Signed)
 HPI:  79 year-old woman referred for evaluation of aortic stenosis. She has been followed for many years by Dr Rothbart, and recently has developed symptoms and noninvasive findings suggestive of progressive aortic aortic stenosis. She has no history of CAD or MI, but was hospitalized with CHF in January 2014. She also has background cardiac problems including sick sinus syndrome s/p pacemaker placement, PAF on chronic warfarin, and nonobstructive CAD (cath in 2009 for chest pain).  She has developed symptoms of exertional dyspnea and lightheadedness/near syncope (occurs at rest and has been associated with exertion). She remains remarkably independent, living alone and continues to drive. She describes 'good days and bad days,' but lately has more frequent dyspnea and is increasingly limited by this. Since her hospitalization in January she has had no further orthopnea, PND, or leg swelling. She denies chest pain or pressure.   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 She's been widowed since 1992 and was married for over 50 years. She had one son who died of Hodgkin's Lymphoma and another son who lives nearby. She's a lifelong nonsmoker. She's been diagnosed with CLL but apparently has required no treatment. She received Procrit injections but hasn't needed them in several months.  Outpatient Encounter Prescriptions as of 09/14/2012  Medication Sig Dispense Refill  . acetaminophen (TYLENOL) 500 MG tablet Take 1,000 mg by mouth 4 (four) times daily.        . albuterol (PROVENTIL HFA;VENTOLIN HFA) 108 (90 BASE) MCG/ACT inhaler Inhale 2 puffs into the lungs every 6 (six) hours as needed for wheezing.      . calcium-vitamin D (OSCAL WITH D) 500-200 MG-UNIT per tablet Take 1 tablet by mouth every morning.       . losartan (COZAAR) 100 MG tablet Take 100 mg by mouth daily.      . metoprolol tartrate (LOPRESSOR) 25 MG tablet Take 1 tablet (25 mg total) by mouth 2 (two) times daily.  180 tablet  3    . Multiple Vitamins-Minerals (MULTIVITAMIN WITH MINERALS) tablet Take 1 tablet by mouth every morning.       . nitroGLYCERIN (NITROSTAT) 0.4 MG SL tablet Place 1 tablet (0.4 mg total) under the tongue every 5 (five) minutes as needed for chest pain.  25 tablet  11  . pravastatin (PRAVACHOL) 40 MG tablet Take 40 mg by mouth at bedtime.       . torsemide (DEMADEX) 20 MG tablet Take 20 mg by mouth daily.      . verapamil (CALAN-SR) 180 MG CR tablet Take 1 tablet (180 mg total) by mouth at bedtime.  90 tablet  3  . warfarin (COUMADIN) 2.5 MG tablet Take 1.25-2.5 mg by mouth every evening. 2.5 mg daily except she takes 1/2 tablet on thursdays       Facility-Administered Encounter Medications as of 09/14/2012  Medication Dose Route Frequency Provider Last Rate Last Dose  . epoetin alfa (EPOGEN,PROCRIT) injection 40,000 Units  40,000 Units Subcutaneous Once Eric S Neijstrom, MD        Detrol; Sanctura; Sulfa antibiotics; and Sulfonamide derivatives  Past Medical History  Diagnosis Date  . Sick sinus syndrome 11/2006    paroxysmal atrial fibrillation; dual-chamber pacemaker in 11/2006  . Aortic stenosis     mild  . Left bundle branch block   . Congestive heart failure     Congestive heart failure with normal ejection fraction  . Arteriosclerotic cardiovascular disease (ASCVD)     coronary angio in 10/09:50% left   anterior descending; 80% PDA; medical therapy advisedOrthostatic decrease in blood pressure  . Epistaxis     mild with negative ENT evaluation  . Dizziness     chronic  . Osteoarthritis   . Irritable bowel syndrome   . Urinary frequency     with incontinence  . Gastroesophageal reflux disease     Esophageal dilatation for stricture in 09/2011  . Rosacea   . Chronic lymphocytic leukemia     Stage I with anemia H./H. of 9/27 in 11/09 with normal MCV; iron deficiency in bone marrow  . Orthostatic hypotension   . Chronic anticoagulation   . Diverticulosis   . Adenomatous polyp  October 2010    (Proximal Small bowel) With high grade dysplasia in polypoid lesion straddling D1/D2  . Chronic kidney disease      Creatinine of 1.7 in 8/08, 1.4 in 10/08 and 1.5 in 11/09    Past Surgical History  Procedure Laterality Date  . Cholecystectomy    . Cervical spine surgery    . Lumbar disc surgery    . Bladder suspension    . Abdominal hysterectomy  1972  . Cataract extraction      Bilateral  . Esophagogastroduodenoscopy  01/09/09    Rourk -noncritical Schatzki's ring, small hiatal hernia, fundal gland polyps, bile-stained gastric mucosa, polypoid lesion straddling D1 and D2 1-2 cm, localized lymphangitic-appearing mucosa at D2 biopsied (adenomatous polyp with high grade glandular dysplasia  . A-v cardiac pacemaker insertion  11/2006    [employer]    History   Social History  . Marital Status: Widowed    Spouse Name: N/A    Number of Children: N/A  . Years of Education: N/A   Occupational History  . retired    Social History Main Topics  . Smoking status: Never Smoker   . Smokeless tobacco: Never Used  . Alcohol Use: No  . Drug Use: No  . Sexually Active: No   Other Topics Concern  . Not on file   Social History Narrative  . No narrative on file    Family History  Problem Relation Age of Onset  . Heart failure Father   . Cancer Father   . Diabetes Mother     ROS:  General: no fevers/chills/night sweats Eyes: no blurry vision, diplopia, or amaurosis ENT: no sore throat or hearing loss Resp: no cough, wheezing, or hemoptysis CV: no edema or palpitations, otherwise see HPI GI: no abdominal pain, nausea, vomiting, diarrhea, or constipation GU: no dysuria, frequency, or hematuria Skin: no rash Neuro: no headache, numbness, tingling, or weakness of extremities, positive for gait unsteadiness Musculoskeletal: mild hip and knee pain with ambulation Heme: no bleeding, DVT, or easy bruising Endo: no polydipsia or polyuria  BP 133/49  Pulse 72  Ht  5' (1.524 m)  Wt 70.308 kg (155 lb)  BMI 30.27 kg/m2  PHYSICAL EXAM: Pt is alert and oriented, WD, WN, pleasant elderly woman in no distress. HEENT: normal Neck: JVP normal. Carotid upstrokes normal with bilateral bruits. No thyromegaly. Lungs: equal expansion, clear bilaterally CV: Apex is discrete and nondisplaced, RRR with grade 3/6 late-peaking crescendo-decrescendo murmur at the RUSB, diminished A2 Abd: soft, NT, +BS, no bruit, no hepatosplenomegaly Back: no CVA tenderness Ext: no C/C/E        Femoral pulses 2+=         DP/PT pulses intact and = Skin: warm and dry without rash Neuro: CNII-XII intact               Strength intact = bilaterally  EKG:  Electronic ventricular pacemaker 65 bpm  2D Echo 08/23/2012: Left ventricle: The cavity size was normal. There was moderate concentric hypertrophy. Systolic function was normal. The estimated ejection fraction was in the range of 60% to 65%. Wall motion was normal; there were no regional wall motion abnormalities.  ------------------------------------------------------------ Aortic valve: Mildly to moderately calcified annulus. Moderately thickened, moderately calcified leaflets. Cusp separation was moderately reduced. Doppler: There was moderate stenosis. Mild regurgitation. VTI ratio of LVOT to aortic valve: 0.29. Valve area: 0.51cm^2(VTI). Indexed valve area: 0.31cm^2/m^2 (VTI). Peak velocity ratio of LVOT to aortic valve: 0.29. Valve area: 0.51cm^2 (Vmax). Indexed valve area: 0.31cm^2/m^2 (Vmax). Mean gradient: 25mm Hg (S). Peak gradient: 40mm Hg (S).  ------------------------------------------------------------ Aorta: Aortic root: The aortic root was normal in size. Ascending aorta: The ascending aorta was normal in size.  ------------------------------------------------------------ Mitral valve: Calcified, moderately to severely thickened annulus. Leaflet separation was normal. Doppler: Transvalvular velocity was  within the normal range. There was no evidence for stenosis. Mild regurgitation.  ------------------------------------------------------------ Left atrium: The atrium was moderately dilated.  ------------------------------------------------------------ Atrial septum: No defect or patent foramen ovale was identified.  ------------------------------------------------------------ Right ventricle: The cavity size was normal. Wall thickness was mildly increased. Pacer wire or catheter noted in right ventricle. Systolic function was hyperdynamic.  ------------------------------------------------------------ Pulmonic valve: Structurally normal valve. Cusp separation was normal. Doppler: Transvalvular velocity was within the normal range. No regurgitation.  ------------------------------------------------------------ Tricuspid valve: Structurally normal valve. Leaflet separation was normal. Doppler: Transvalvular velocity was within the normal range. Mild regurgitation.  ------------------------------------------------------------ Pulmonary artery: The main pulmonary artery was normal-sized. Systolic pressure was mildly increased.  ------------------------------------------------------------ Right atrium: The atrium was mildly to moderately dilated.  ------------------------------------------------------------ Pericardium: There was no pericardial effusion.  ------------------------------------------------------------ Systemic veins: Inferior vena cava: The vessel was normal in size; the respirophasic diameter changes were in the normal range (= 50%); findings are consistent with normal central venous pressure.  ------------------------------------------------------------  2D measurements Normal Doppler measurements Norma Left ventricle l LVID ED, 43.8 mm 43-52 Main pulmonary artery chord, Pressure, 45 mm Hg =30 PLAX S LVID ES, 34.2 mm 23-38 LVOT chord, Peak vel, 91. cm/s  ----- PLAX S 6 FS, chord, 22 % >29 VTI, S 22. cm ----- PLAX 5 LVPW, ED 14.1 mm ------ Aortic valve IVS/LVPW 1.21 <1.3 Peak vel, 317 cm/s ----- ratio, ED S Ventricular septum Mean vel, 227 cm/s ----- IVS, ED 17 mm ------ S LVOT VTI, S 78. cm ----- Diam, S 15 mm ------ 8 Area 1.77 cm^2 ------ Mean 25 mm Hg ----- Aorta gradient, Root diam, 29 mm ------ S ED Peak 40 mm Hg ----- Left atrium gradient, AP dim 45 mm ------ S AP dim 2.69 cm/m^2 <2.2 VTI ratio 0.2 ----- index LVOT/AV 9 Area, VTI 0.5 cm^2 ----- M-mode measurements Normal 1 Aorta Area index 0.3 cm^2/m^2 ----- Root diam, 20 mm 20-37 (VTI) 1 ED Peak vel 0.2 ----- Left atrium ratio, 9 AP dim, ES 56 mm 19-40 LVOT/AV AP dim 3.35 cm/m^2 <2.2 Area, Vmax 0.5 cm^2 ----- index, ES 1 LA/Ao root 2.8 ------ Area index 0.3 cm^2/m^2 ----- ratio (Vmax) 1 Regurg PHT 398 ms ----- Mitral valve Regurg 38. cm/s ----- alias vel, 5 PISA Max regurg 535 cm/s ----- vel Regurg VTI 176 cm ----- ERO, PISA 0.0 cm^2 ----- 7 Regurg 12 ml ----- vol, PISA Tricuspid valve Regurg 294 cm/s ----- peak vel Peak RV-RA 35 mm Hg ----- gradient, S Systemic veins Estimated 10 mm Hg ----- CVP   Right ventricle Pressure, 45 mm Hg <30  Cardiac cath 12/21/2007: PROCEDURES PERFORMED:  1. Selective coronary angiography.  2. Left heart cath.  Left ventriculogram was not performed in an effort to limit the contrast  exposure.  DESCRIPTION OF PROCEDURE: The risks and indications of catheterization  were explained. Consent was signed and placed on the chart. A 5-French  arterial sheath was placed in the right femoral artery using a modified  Seldinger technique, standard catheters including a JL-4, JR-4, and  angled pigtail. All catheter exchanges were made over the wire. There  were no apparent complications. We did have some difficulty in crossing  the aortic valve, but there were able to use this after a few attempts  with the pigtail catheter and  a J-wire.  Central aortic pressure was 151/63 with a mean of 93. LV pressure  163/22 with an EDP of 28. There was no aortic stenosis.  Total contrast used was 70 mL.  Left main had mild calcification. There was no significant stenosis.  LAD was a long vessel coursing wrapping the apex. It gave off a large  first diagonal. The LAD was a bit hard to fill completely and visualize  due to the size and overlapping nature of the circumflex. There was a  40-50% tubular lesion in the proximal LAD followed by about 50-60%  lesion in the mid LAD after the first diagonal.  Left circumflex was a large dominant vessel. It gave off a moderate  size ramus, a small OM1, a large OM2, a moderate OM3, several  posterolaterals, and a PDA with angiographically normal.  Right coronary artery was a very small and nondominant vessel with an  80% lesion in the midsection.  ASSESSMENT:  1. Nonobstructive coronary artery disease.  2. Elevated left ventricular end-diastolic pressure consistent with  the patient's known history of diastolic heart failure.  PLAN/DISCUSSION: Given her coronary anatomy, I do not think her chest  pain is cardiac in nature. We will continue to treat her with medical  therapy. We will watch her renal function closely.  ASSESSMENT AND PLAN: 79 year-old woman with moderate/severe aortic stenosis. Comorbidities include NYHA Class 2-3 diastolic CHF, PAF on chronic anticoagulation, advanced age, HTN, and CLL. She is increasingly limited by symptoms as detailed above. Her physical exam suggests severe aortic stenosis, but echo is more suggestive of moderate AS (especially velocity measurements). Considering her progressive symptoms and discrepant data, I think cardiac catheterization is indicated. Will plan on right/left heart cath for full assessment. We have reviewed risks, indications, and alternatives to right and left heart cath and she agrees to proceed. Further plans pending cath results.  Will likely require evaluation in the Multid[facility]ve [facility] by Dr Owen. I have a lengthy discussion with the patient and her daughter-in-law today about the natural history of aortic stenosis, especially once symptoms arise. This discussion was placed in the context of her age and comorbid medical conditions. We will have to proceed very carefully as she has CKD and will plan on a limited contrast eval at the time of cardiac cath.  [name] 09/18/2012 2:32 PM      

## 2012-09-26 NOTE — CV Procedure (Signed)
   Cardiac Catheterization Procedure Note  Name: NAZARIAH CADET MRN: [id number] DOB: Jul 09, 1915  Procedure: Right Heart Cath, Left Heart Cath, Selective Coronary Angiography Indication: aortic stenosis  Procedural Details: The right groin was prepped, draped, and anesthetized with 1% lidocaine. Using the modified Seldinger technique a 5 French sheath was placed in the right femoral artery and a 7 French sheath was placed in the right femoral vein. A Swan-Ganz catheter was used for the right heart catheterization. Standard protocol was followed for recording of right heart pressures and sampling of oxygen saturations. Fick cardiac output was calculated. Standard Judkins catheters were used for selective coronary angiography. Left ventriculography was deferred because of chronic kidney disease. The aortic valve was crossed with a pigtail catheter without much difficulty. There were no immediate procedural complications. The patient was transferred to the post catheterization recovery area for further monitoring.  Procedural Findings: Hemodynamics RA 19 RV 48/20 PA 50/17 mean 33 PCWP v wave 35, mean 23 LV 152/22 AO 137/39 mean 72  Oxygen saturations: PA 52 AO 91  Cardiac Output (Fick) 4.7  Cardiac Index (Fick) 2.7  Aortic Valve mean gradient: 15 mm Hg   Coronary angiography: Coronary dominance: right  Left mainstem: The left mainstem is patent. It arises from the left cusp it divides into the LAD and left circumflex. there is mild left main stenosis.  Left anterior descending (LAD): The LAD has mild to moderate diffuse disease throughout the proximal and mid portions. There is diffuse 50% stenosis present. The diagonal branch is patent with 30-40% stenosis. The LAD reaches the left ventricular apex.  Left circumflex (LCx): The left circumflex is a large, dominant vessel. There is no obstructive disease noted throughout its distribution. It supplies 2 obtuse marginal branches, 2  posterolateral branches, and a left PDA.  Right coronary artery (RCA): Small to medium in caliber with 50-70% mid vessel stenosis. This is a nondominant vessel.  Left ventriculography: Deferred  Final Conclusions:   1. Mild to moderate diffuse LAD stenosis 2. Widely patent dominant left circumflex 3. Moderate stenosis of a nondominant RCA 4. Elevated intracardiac filling pressures with a large V-wave and a wedge tracing 5. Moderate aortic stenosis.  Recommendations: Taking all data into account, I think the patient's aortic stenosis is moderate at worst. Recommend continued medical therapy for the patient's mild to moderate diffuse coronary disease. Also require ongoing heart failure treatment as her hemodynamics suggest significant diastolic dysfunction. I think she can be followed for her aortic stenosis for the time being.  Tonny Bollman 09/26/2012, 1:55 PM

## 2012-09-26 NOTE — Progress Notes (Signed)
Pt tolerated food well, and voided 100cc of yellow urine.  Tolerated well

## 2012-09-26 NOTE — Interval H&P Note (Signed)
History and Physical Interval Note:  09/26/2012 1:16 PM  [name]  has presented today for surgery, with the diagnosis of cp  The various methods of treatment have been discussed with the patient and family. After consideration of risks, benefits and other options for treatment, the patient has consented to  Procedure(s): JV LEFT AND RIGHT HEART CATHETERIZATION WITH CORONARY ANGIOGRAM (N/A) as a surgical intervention .  The patient's history has been reviewed, patient examined, no change in status, stable for surgery.  I have reviewed the patient's chart and labs.  Questions were answered to the patient's satisfaction.     Tonny Bollman

## 2012-09-29 ENCOUNTER — Encounter: Payer: Medicare Other | Admitting: Thoracic Surgery (Cardiothoracic Vascular Surgery)

## 2012-10-03 ENCOUNTER — Ambulatory Visit (INDEPENDENT_AMBULATORY_CARE_PROVIDER_SITE_OTHER): Payer: Medicare Other | Admitting: *Deleted

## 2012-10-03 DIAGNOSIS — Z7901 Long term (current) use of anticoagulants: Secondary | ICD-10-CM

## 2012-10-13 ENCOUNTER — Encounter: Payer: Self-pay | Admitting: Cardiology

## 2012-10-13 ENCOUNTER — Ambulatory Visit (INDEPENDENT_AMBULATORY_CARE_PROVIDER_SITE_OTHER): Payer: Medicare Other | Admitting: Cardiology

## 2012-10-13 ENCOUNTER — Ambulatory Visit (INDEPENDENT_AMBULATORY_CARE_PROVIDER_SITE_OTHER): Payer: Medicare Other | Admitting: *Deleted

## 2012-10-13 VITALS — BP 132/64 | HR 68 | Ht 60.0 in | Wt 156.4 lb

## 2012-10-13 DIAGNOSIS — C9110 Chronic lymphocytic leukemia of B-cell type not having achieved remission: Secondary | ICD-10-CM

## 2012-10-13 DIAGNOSIS — I350 Nonrheumatic aortic (valve) stenosis: Secondary | ICD-10-CM

## 2012-10-13 DIAGNOSIS — R0602 Shortness of breath: Secondary | ICD-10-CM

## 2012-10-13 DIAGNOSIS — I10 Essential (primary) hypertension: Secondary | ICD-10-CM

## 2012-10-13 DIAGNOSIS — R319 Hematuria, unspecified: Secondary | ICD-10-CM | POA: Insufficient documentation

## 2012-10-13 DIAGNOSIS — I7090 Unspecified atherosclerosis: Secondary | ICD-10-CM

## 2012-10-13 DIAGNOSIS — I359 Nonrheumatic aortic valve disorder, unspecified: Secondary | ICD-10-CM

## 2012-10-13 DIAGNOSIS — I5032 Chronic diastolic (congestive) heart failure: Secondary | ICD-10-CM

## 2012-10-13 DIAGNOSIS — Z950 Presence of cardiac pacemaker: Secondary | ICD-10-CM

## 2012-10-13 DIAGNOSIS — I2510 Atherosclerotic heart disease of native coronary artery without angina pectoris: Secondary | ICD-10-CM

## 2012-10-13 DIAGNOSIS — Z7901 Long term (current) use of anticoagulants: Secondary | ICD-10-CM

## 2012-10-13 LAB — POCT INR: INR: 2.2

## 2012-10-13 MED ORDER — TORSEMIDE 20 MG PO TABS: 40.0000 mg | ORAL_TABLET | Freq: Every day | ORAL | Status: DC

## 2012-10-13 NOTE — Assessment & Plan Note (Signed)
There is no reason to suspect that pacemaker or pacemaker dysfunction is contributing to current symptoms. Normal function was verified 6 months ago.

## 2012-10-13 NOTE — Progress Notes (Signed)
Patient ID: [name], female   DOB: 14-Dec-1915, 79 y.o.   MRN: [id number]  HPI: Scheduled return visit for for this remarkably intact 79 year old woman with severe exercise intolerance and exertional dyspnea. Cardiac catheterization revealed moderate coronary disease and moderate aortic stenosis. It was not clear that intervention would be helpful, and medical therapy was advised. An appointment with Hematology-Oncology was never scheduled. Patient has had chronic urethral bleeding and is scheduled to be seen by a urologist at [facility]  No modification was made in her medical therapy following cardiac catheterization.  Symptoms are unchanged. Even walking on the flat for short distance is difficult and causes dyspnea. She has no orthopnea nor PND. She has not noted significant pedal edema. There has been no lightheadedness or syncope. She does experience a "popping sensation" in the right ear and over the right neck. This does not occur in conjunction with a heart beat.  Current Outpatient Prescriptions  Medication Sig Dispense Refill  . acetaminophen (TYLENOL) 500 MG tablet Take 1,000 mg by mouth 4 (four) times daily.        [name] albuterol (PROVENTIL HFA;VENTOLIN HFA) 108 (90 BASE) MCG/ACT inhaler Inhale 2 puffs into the lungs every 6 (six) hours as needed for wheezing.      . calcium-vitamin D (OSCAL WITH D) 500-200 MG-UNIT per tablet Take 1 tablet by mouth every morning.       [name] losartan (COZAAR) 100 MG tablet Take 100 mg by mouth daily.      . metoprolol tartrate (LOPRESSOR) 25 MG tablet Take 1 tablet (25 mg total) by mouth 2 (two) times daily.  180 tablet  3  . Multiple Vitamins-Minerals (MULTIVITAMIN WITH MINERALS) tablet Take 1 tablet by mouth every morning.       . nitroGLYCERIN (NITROSTAT) 0.4 MG SL tablet Place 1 tablet (0.4 mg total) under the tongue every 5 (five) minutes as needed for chest pain.  25 tablet  11  . pravastatin (PRAVACHOL) 40 MG tablet Take 40 mg  by mouth at bedtime.       . verapamil (CALAN-SR) 180 MG CR tablet TAKE 1 TABLET (180 MG TOTAL)  BY MOUTH AT BEDTIME.  90 tablet  2  . warfarin (COUMADIN) 2.5 MG tablet Take 1.25-2.5 mg by mouth every evening. 2.5 mg daily except she takes 1/2 tablet on thursdays      . torsemide (DEMADEX) 20 MG tablet Take 2 tablets (40 mg total) by mouth daily.  60 tablet  5   No current facility-administered medications for this visit.   Facility-Administered Medications Ordered in Other Visits  Medication Dose Route Frequency Provider Last Rate Last Dose  . epoetin alfa (EPOGEN,PROCRIT) injection 40,000 Units  40,000 Units Subcutaneous Once Randall An, MD       Allergies  Allergen Reactions  . Detrol (Tolterodine Tartrate) Other (See Comments)    Patient states: "I was told to never take it again, I do not recall the reaction"  . Sanctura (Trospium Chloride) Nausea Only and Other (See Comments)    Intense lower abd pain  . Sulfa Antibiotics Other (See Comments)    Extreme dizziness  . Sulfonamide Derivatives Other (See Comments)    REACTION: GI distress     Past medical history, social history, and family history reviewed and updated.  ROS: Chronic urethral bleeding that has been modest, previously evaluated by [facility] and not thought to represent a significant pathologic condition.  Stable weight and appetite. All other systems  reviewed and are negative.  PHYSICAL EXAM: BP 132/64  Pulse 68  Ht 5' (1.524 m)  Wt 70.943 kg (156 lb 6.4 oz)  BMI 30.54 kg/m2;  Body mass index is 30.54 kg/(m^2). General-Well developed; no acute distress Body habitus-mildly overweight Neck-No JVD; no carotid bruits nor transmitted murmur; pulses parvus Lungs-clear lung fields; resonant to percussion Cardiovascular-normal PMI; normal S1 and absent A2; grade 3/6 wheezing and mid peaking systolic murmur at the cardiac base Abdomen-normal bowel sounds; soft and non-tender without masses or  organomegaly Musculoskeletal-No deformities, no cyanosis or clubbing Neurologic-Normal cranial nerves; symmetric strength and tone Skin-Warm, no significant lesions Extremities-distal pulses intact; 1/2+ edema  EKG: Tracing performed 09/14/12 reviewed. Underlying atrial fibrillation with demand ventricular pacing.  6 minute walk test: Traversed 150 feet and stopped with dyspnea and exhaustion; O2 saturation 94% throughout; ventricular demand pacing throughout.  Paxtang Bing, MD 10/13/2012  11:56 AM  ASSESSMENT AND PLAN

## 2012-10-13 NOTE — Assessment & Plan Note (Signed)
Onset in approximately 2012. Patient is seeking reevaluation by Urology at [facility].

## 2012-10-13 NOTE — Assessment & Plan Note (Signed)
Coronary disease of moderate severity is probably not contributing to her current symptoms. Consideration could be given to stress testing to rule out ischemia, but due to patient's advanced age and other issues that need attention, I am not inclined to proceed with such testing at present.

## 2012-10-13 NOTE — Assessment & Plan Note (Signed)
Aortic stenosis not found to be of adequate severity to justify percutaneous intervention. Nonetheless, it certainly may be contributing to her current symptoms. With high left atrial pressure determined at catheterization, we will proceed with additional diuresis.

## 2012-10-13 NOTE — Assessment & Plan Note (Addendum)
Anemia is likely significantly contributing to her symptoms. There is no compelling evidence for iron deficiency.  We will schedule an appointment for reassessment by Hematology-Oncology.  Transfusion, better control of CLL and/or treatment with an ESA will allow [place] to determine how much benefit can be achieved by increasing hemoglobin.

## 2012-10-13 NOTE — Progress Notes (Deleted)
Name: [name]    DOB: [birth year]  Age: 79 y.o.  MR#: [id number]       PCP:  [name], MD      Insurance: Payor: MEDICARE / Plan: MEDICARE PART A AND B / Product Type: *No Product type* /   CC:   No chief complaint on file. BOTTLES  VS Filed Vitals:   10/13/12 1047  BP: 132/64  Pulse: 68  Height: 5' (1.524 m)  Weight: 156 lb 6.4 oz (70.943 kg)    Weights Current Weight  10/13/12 156 lb 6.4 oz (70.943 kg)  09/26/12 155 lb (70.308 kg)  09/26/12 155 lb (70.308 kg)    Blood Pressure  BP Readings from Last 3 Encounters:  10/13/12 132/64  09/26/12 134/50  09/26/12 134/50     Admit date:  (Not on file) Last encounter with RMR:  09/17/2012   Allergy Detrol; Sanctura; Sulfa antibiotics; and Sulfonamide derivatives  Current Outpatient Prescriptions  Medication Sig Dispense Refill  . acetaminophen (TYLENOL) 500 MG tablet Take 1,000 mg by mouth 4 (four) times daily.        [name] albuterol (PROVENTIL HFA;VENTOLIN HFA) 108 (90 BASE) MCG/ACT inhaler Inhale 2 puffs into the lungs every 6 (six) hours as needed for wheezing.      . calcium-vitamin D (OSCAL WITH D) 500-200 MG-UNIT per tablet Take 1 tablet by mouth every morning.       [name] losartan (COZAAR) 100 MG tablet Take 100 mg by mouth daily.      . metoprolol tartrate (LOPRESSOR) 25 MG tablet Take 1 tablet (25 mg total) by mouth 2 (two) times daily.  180 tablet  3  . Multiple Vitamins-Minerals (MULTIVITAMIN WITH MINERALS) tablet Take 1 tablet by mouth every morning.       . nitroGLYCERIN (NITROSTAT) 0.4 MG SL tablet Place 1 tablet (0.4 mg total) under the tongue every 5 (five) minutes as needed for chest pain.  25 tablet  11  . pravastatin (PRAVACHOL) 40 MG tablet Take 40 mg by mouth at bedtime.       . torsemide (DEMADEX) 20 MG tablet Take 20 mg by mouth daily.      . verapamil (CALAN-SR) 180 MG CR tablet TAKE 1 TABLET (180 MG TOTAL)  BY MOUTH AT BEDTIME.  90 tablet  2  . warfarin (COUMADIN) 2.5 MG tablet Take 1.25-2.5 mg by  mouth every evening. 2.5 mg daily except she takes 1/2 tablet on thursdays       No current facility-administered medications for this visit.   Facility-Administered Medications Ordered in Other Visits  Medication Dose Route Frequency Provider Last Rate Last Dose  . epoetin alfa (EPOGEN,PROCRIT) injection 40,000 Units  40,000 Units Subcutaneous Once Randall An, MD        Discontinued Meds:   There are no discontinued medications.  Patient Active Problem List   Diagnosis Date Noted  . Pacemaker 10/15/2011  . Transient blindness 09/14/2011  . Paroxysmal atrial fibrillation 09/14/2011  . Aortic stenosis   . Congestive heart failure   . Chronic kidney disease   . Gastroesophageal reflux disease   . Chronic lymphocytic leukemia   . Chronic anticoagulation 06/10/2010  . Arteriosclerotic cardiovascular disease (ASCVD) 03/22/2009  . HYPERLIPIDEMIA 12/17/2008  . HYPERTENSION 12/17/2008  . Sick sinus syndrome 11/15/2006    LABS    Component Value Date/Time   NA 141 09/22/2012 0900   NA 144 09/07/2012 0930   NA 145 09/02/2012 1224   K 4.4 09/22/2012 0900  K 4.4 09/07/2012 0930   K 5.4* 09/02/2012 1224   CL 107 09/22/2012 0900   CL 110 09/07/2012 0930   CL 109 09/02/2012 1224   CO2 24 09/22/2012 0900   CO2 25 09/07/2012 0930   CO2 26 09/02/2012 1224   GLUCOSE 122* 09/22/2012 0900   GLUCOSE 123* 09/07/2012 0930   GLUCOSE 142* 09/02/2012 1224   BUN 67* 09/22/2012 0900   BUN 60* 09/07/2012 0930   BUN 69* 09/02/2012 1224   CREATININE 1.95* 09/22/2012 0900   CREATININE 1.59* 09/07/2012 0930   CREATININE 2.07* 09/02/2012 1224   CREATININE 1.50* 04/14/2012 0345   CREATININE 1.59* 04/08/2012 0336   CREATININE 1.64* 04/07/2012 0336   CALCIUM 8.8 09/22/2012 0900   CALCIUM 9.2 09/07/2012 0930   CALCIUM 9.0 09/02/2012 1224   GFRNONAA 29* 04/14/2012 0345   GFRNONAA 27* 04/08/2012 0336   GFRNONAA 26* 04/07/2012 0336   GFRAA 33* 04/14/2012 0345   GFRAA 31* 04/08/2012 0336   GFRAA 30* 04/07/2012 0336    CMP     Component Value Date/Time   NA 141 09/22/2012 0900   K 4.4 09/22/2012 0900   CL 107 09/22/2012 0900   CO2 24 09/22/2012 0900   GLUCOSE 122* 09/22/2012 0900   BUN 67* 09/22/2012 0900   CREATININE 1.95* 09/22/2012 0900   CREATININE 1.50* 04/14/2012 0345   CALCIUM 8.8 09/22/2012 0900   PROT 6.1 09/02/2012 1224   ALBUMIN 4.0 09/02/2012 1224   AST 16 09/02/2012 1224   ALT 10 09/02/2012 1224   ALKPHOS 80 09/02/2012 1224   BILITOT 0.4 09/02/2012 1224   GFRNONAA 29* 04/14/2012 0345   GFRAA 33* 04/14/2012 0345       Component Value Date/Time   WBC 13.1* 09/22/2012 0900   WBC 11.4* 09/02/2012 1224   WBC 18.8* 07/19/2012 1006   HGB 8.6* 09/22/2012 0900   HGB 8.5* 09/02/2012 1224   HGB 9.6* 07/19/2012 1006   HCT 26.9* 09/22/2012 0900   HCT 25.6* 09/02/2012 1224   HCT 29.2* 07/19/2012 1006   MCV 94.7 09/22/2012 0900   MCV 93.4 09/02/2012 1224   MCV 98.0 07/19/2012 1006    Lipid Panel     Component Value Date/Time   CHOL 83 09/07/2012 0930   TRIG 126 09/07/2012 0930   HDL 28* 09/07/2012 0930   CHOLHDL 3.0 09/07/2012 0930   VLDL 25 09/07/2012 0930   LDLCALC 30 09/07/2012 0930    ABG    Component Value Date/Time   PHART 7.390 09/26/2012 1330   PCO2ART 38.3 09/26/2012 1330   PO2ART 60.0* 09/26/2012 1330   HCO3 21.8 09/26/2012 1339   TCO2 23 09/26/2012 1339   ACIDBASEDEF 4.0* 09/26/2012 1339   O2SAT 52.0 09/26/2012 1339     Lab Results  Component Value Date   TSH 2.391 09/02/2012   BNP (last 3 results)  Recent Labs  04/05/12 0350 04/05/12 1919 04/14/12 0345  PROBNP 1475.0* 1598.0* 442.2   Cardiac Panel (last 3 results) No results found for this basename: CKTOTAL, CKMB, TROPONINI, RELINDX,  in the last 72 hours  Iron/TIBC/Ferritin    Component Value Date/Time   IRON 49 05/01/2011 1510   TIBC 276 05/01/2011 1510   FERRITIN 178 05/01/2011 1510     EKG Orders placed in visit on 09/14/12  . EKG 12-LEAD     Prior Assessment and Plan Problem List as of 10/13/2012   HYPERLIPIDEMIA   Last  Assessment & Plan   09/02/2012 Office Visit Written 09/02/2012  1:47 PM by  Kathlen Brunswick, MD     Fasting lipid profile will be repeated.    HYPERTENSION   Last Assessment & Plan   09/02/2012 Office Visit Written 09/02/2012  1:48 PM by Kathlen Brunswick, MD     Rare blood pressure elevations over the past year. Current therapy is adequate.    Arteriosclerotic cardiovascular disease (ASCVD)   Last Assessment & Plan   09/02/2012 Office Visit Written 09/02/2012  1:43 PM by Kathlen Brunswick, MD     Progressive coronary artery disease could also be a factor in her current symptoms. She will require cardiac catheterization to determine optimal treatment.    Chronic anticoagulation   Last Assessment & Plan   09/02/2012 Office Visit Written 09/02/2012  1:44 PM by Kathlen Brunswick, MD     She has done well with anticoagulation despite progressive anemia. We will reassess a CBC, iron studies and FOBT.    Sick sinus syndrome   Last Assessment & Plan   11/19/2011 Office Visit Written 11/20/2011  8:59 AM by Kathlen Brunswick, MD     Paroxysmal atrial fibrillation; dual-chamber pacemaker in 11/2006; Last evaluated by Dr. Ladona Ridgel in 10/2011-he will continue to follow her as appropriate.    Aortic stenosis   Last Assessment & Plan   09/02/2012 Office Visit Written 09/02/2012  1:42 PM by Kathlen Brunswick, MD     Patient has somewhat atypical symptoms, but are likely the result of her aortic valve disease. Current lifestyle is unacceptable to her, and she would be willing to undergo surgical aortic valve replacement or TAVR if that would allow her to return to her usual active lifestyle and sense of well-being. Chest x-ray and basic laboratory testing will be performed to exclude easily reversible causes for her symptoms. She has had a moderately severe anemia, presumably related to CLL, that certainly could be a factor. In the absence of any significant new abnormalities, she'll be referred to our valve clinic.     Congestive heart failure   Last Assessment & Plan   09/02/2012 Office Visit Written 09/02/2012  1:47 PM by Kathlen Brunswick, MD     Patient does not appear to have active congestive heart failure at present. This will be reassessed with a chest x-ray and BNP level.    Chronic kidney disease   Last Assessment & Plan   09/02/2012 Office Visit Written 09/02/2012  1:44 PM by Kathlen Brunswick, MD     Creatinine has been normal in recent years. Unless renal function has worsened, I doubt her anemia is attributable to renal problems.    Gastroesophageal reflux disease   Chronic lymphocytic leukemia   Last Assessment & Plan   09/02/2012 Office Visit Written 09/02/2012  1:45 PM by Kathlen Brunswick, MD     Chronic lymphocytic leukemia is a factor in determining what treatment for her valvular heart disease would be reasonable. We will coordinate our recommendations with Dr. Thornton Papas assessment.    Transient blindness   Last Assessment & Plan   09/30/2011 Office Visit Edited 09/30/2011 11:42 AM by Dyann Kief, PA     Resolved and without recurrence    Paroxysmal atrial fibrillation   Last Assessment & Plan   04/05/2012 Office Visit Written 04/05/2012  4:49 PM by Jodelle Gross, NP     She remains in NSR and on anticoagulation. Labs will evaluate for anemia causing symptoms of dyspnea. Will review when available.    Pacemaker   Last Assessment &  Plan   10/15/2011 Office Visit Written 10/15/2011 11:41 AM by Marinus Maw, MD     Her device is working normally. We'll plan to recheck in several months. Today we reprogrammed her device from DD IR to DDDR.        Imaging: No results found.

## 2012-10-13 NOTE — Assessment & Plan Note (Signed)
BUN and creatinine somewhat more elevated than in the past. Possible adverse effect of contrast administration. In the face of increasing dose of diuretic, we will follow electrolytes and renal function closely.

## 2012-10-13 NOTE — Patient Instructions (Addendum)
Your physician recommends that you schedule a follow-up appointment in: 4 WEEKS WITH DR RR  Your physician has recommended you make the following change in your medication:   1) INCREASE DEMADEX TO 40MG  ONCE DAILY (TAKE 2 OF THE DEMADEX TABLETS TO EQUAL 40MG )  Your physician recommends that you return for lab work in: ONE AND THREE WEEKS (SLIPS GIVEN FOR BNP,BMET) Your physician recommends that you have follow up lab work, we will mail you a reminder letter to alert you when to go [facility], located across the street from our office. FOR BMET,CBC  Your physician recommends THAT YOU HAVE AN APPOINTMENT FOR HEMATOLOGY OFFICE A staff member from our office will alert you the with appointment date and time, once available

## 2012-10-13 NOTE — Assessment & Plan Note (Signed)
Blood pressure control is good with current medications, which will be continued. 

## 2012-10-14 ENCOUNTER — Encounter: Payer: Medicare Other | Admitting: Cardiology

## 2012-10-19 ENCOUNTER — Other Ambulatory Visit: Payer: Self-pay | Admitting: *Deleted

## 2012-10-19 ENCOUNTER — Encounter: Payer: Self-pay | Admitting: *Deleted

## 2012-10-19 DIAGNOSIS — R0602 Shortness of breath: Secondary | ICD-10-CM

## 2012-10-19 DIAGNOSIS — I10 Essential (primary) hypertension: Secondary | ICD-10-CM

## 2012-10-21 LAB — CBC
HCT: 26.3 % — ABNORMAL LOW (ref 36.0–46.0)
Hemoglobin: 8.5 g/dL — ABNORMAL LOW (ref 12.0–15.0)
MCH: 30.4 pg (ref 26.0–34.0)
RBC: 2.8 MIL/uL — ABNORMAL LOW (ref 3.87–5.11)

## 2012-10-21 LAB — BASIC METABOLIC PANEL
CO2: 30 mEq/L (ref 19–32)
Calcium: 9.1 mg/dL (ref 8.4–10.5)
Chloride: 104 mEq/L (ref 96–112)
Glucose, Bld: 122 mg/dL — ABNORMAL HIGH (ref 70–99)
Potassium: 4.8 mEq/L (ref 3.5–5.3)
Sodium: 143 mEq/L (ref 135–145)

## 2012-10-23 ENCOUNTER — Encounter: Payer: Self-pay | Admitting: Cardiology

## 2012-10-23 DIAGNOSIS — R7301 Impaired fasting glucose: Secondary | ICD-10-CM | POA: Insufficient documentation

## 2012-10-23 DIAGNOSIS — D649 Anemia, unspecified: Secondary | ICD-10-CM | POA: Insufficient documentation

## 2012-10-25 ENCOUNTER — Ambulatory Visit (INDEPENDENT_AMBULATORY_CARE_PROVIDER_SITE_OTHER): Payer: Medicare Other | Admitting: Internal Medicine

## 2012-10-25 ENCOUNTER — Encounter: Payer: Self-pay | Admitting: Internal Medicine

## 2012-10-25 VITALS — BP 118/60 | HR 67 | Ht 60.0 in | Wt 153.0 lb

## 2012-10-25 DIAGNOSIS — I480 Paroxysmal atrial fibrillation: Secondary | ICD-10-CM

## 2012-10-25 DIAGNOSIS — I4891 Unspecified atrial fibrillation: Secondary | ICD-10-CM

## 2012-10-25 DIAGNOSIS — Z950 Presence of cardiac pacemaker: Secondary | ICD-10-CM

## 2012-10-25 DIAGNOSIS — I495 Sick sinus syndrome: Secondary | ICD-10-CM

## 2012-10-25 LAB — PACEMAKER DEVICE OBSERVATION
AL IMPEDENCE PM: 332 Ohm
ATRIAL PACING PM: 28
BRDY-0003RV: 130 {beats}/min
BRDY-0004RV: 130 {beats}/min
RV LEAD THRESHOLD: 0.75 V
VENTRICULAR PACING PM: 93

## 2012-10-25 NOTE — Assessment & Plan Note (Signed)
She is maintaining NSR about 50% of the time. Her ventricular rate is well controlled and she will continue her current medical therapy with coumadin.

## 2012-10-25 NOTE — Progress Notes (Signed)
HPI [name] returns today for followup. She is a pleasant 79 yo woman, with a h/o symptomatic bradycardia, s/p PPM insertion. She also has a h/o diastolic CHF, HTN, and COPD. She denies chest pain or syncope. She had uptitration of her diuretic a several weeks ago. No other problems mentioned today.  Allergies  Allergen Reactions  . Detrol (Tolterodine Tartrate) Other (See Comments)    Patient states: "I was told to never take it again, I do not recall the reaction"  . Sanctura (Trospium Chloride) Nausea Only and Other (See Comments)    Intense lower abd pain  . Sulfa Antibiotics Other (See Comments)    Extreme dizziness  . Sulfonamide Derivatives Other (See Comments)    REACTION: GI distress     Current Outpatient Prescriptions  Medication Sig Dispense Refill  . acetaminophen (TYLENOL) 500 MG tablet Take 1,000 mg by mouth 4 (four) times daily.        [name] albuterol (PROVENTIL HFA;VENTOLIN HFA) 108 (90 BASE) MCG/ACT inhaler Inhale 2 puffs into the lungs every 6 (six) hours as needed for wheezing.      . calcium-vitamin D (OSCAL WITH D) 500-200 MG-UNIT per tablet Take 1 tablet by mouth every morning.       . cefpodoxime (VANTIN) 200 MG tablet Take 200 mg by mouth 2 (two) times daily.       [name] losartan (COZAAR) 100 MG tablet Take 100 mg by mouth daily.      . metoprolol tartrate (LOPRESSOR) 25 MG tablet Take 1 tablet (25 mg total) by mouth 2 (two) times daily.  180 tablet  3  . Multiple Vitamins-Minerals (MULTIVITAMIN WITH MINERALS) tablet Take 1 tablet by mouth every morning.       . nitroGLYCERIN (NITROSTAT) 0.4 MG SL tablet Place 1 tablet (0.4 mg total) under the tongue every 5 (five) minutes as needed for chest pain.  25 tablet  11  . pravastatin (PRAVACHOL) 40 MG tablet Take 40 mg by mouth at bedtime.       . torsemide (DEMADEX) 20 MG tablet Take 2 tablets (40 mg total) by mouth daily.  60 tablet  5  . verapamil (CALAN-SR) 180 MG CR tablet TAKE 1 TABLET (180 MG TOTAL)  BY MOUTH AT  BEDTIME.  90 tablet  2  . warfarin (COUMADIN) 2.5 MG tablet Take 1.25-2.5 mg by mouth every evening. 2.5 mg daily except she takes 1/2 tablet on thursdays       No current facility-administered medications for this visit.   Facility-Administered Medications Ordered in Other Visits  Medication Dose Route Frequency Provider Last Rate Last Dose  . epoetin alfa (EPOGEN,PROCRIT) injection 40,000 Units  40,000 Units Subcutaneous Once Randall An, MD         Past Medical History  Diagnosis Date  . Sick sinus syndrome 11/2006    paroxysmal atrial fibrillation; dual-chamber pacemaker in 11/2006  . Aortic stenosis     mild  . Left bundle branch block   . Congestive heart failure     Congestive heart failure with normal ejection fraction  . Arteriosclerotic cardiovascular disease (ASCVD)     coronary angio in 10/09:50% left anterior descending; 80% PDA; medical therapy advisedOrthostatic decrease in blood pressure  . Epistaxis     mild with negative ENT evaluation  . Dizziness     chronic  . Osteoarthritis   . Irritable bowel syndrome   . Urinary frequency     with incontinence  . Gastroesophageal reflux disease  Esophageal dilatation for stricture in 09/2011  . Rosacea   . Chronic lymphocytic leukemia     Stage I with anemia H./H. of 9/27 in 11/09 with normal MCV; iron deficiency in bone marrow  . Orthostatic hypotension   . Chronic anticoagulation   . Diverticulosis   . 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 Chronic kidney disease      Creatinine of 1.7 in 8/08, 1.4 in 10/08 and 1.5 in 11/09    ROS:   All systems reviewed and negative except as noted in the HPI.   Past Surgical History  Procedure Laterality Date  . Cholecystectomy    . Cervical spine surgery    . Lumbar disc surgery    . Bladder suspension    . Abdominal hysterectomy  1972  . Cataract extraction      Bilateral  .  Esophagogastroduodenoscopy  01/09/09    Rourk -noncritical Schatzki's ring, small hiatal hernia, fundal gland polyps, bile-stained gastric mucosa, polypoid lesion straddling D1 and D2 1-2 cm, localized lymphangitic-appearing mucosa at D2 biopsied (adenomatous polyp with high grade glandular dysplasia  . A-v cardiac pacemaker insertion  11/2006    Medtronic     Family History  Problem Relation Age of Onset  . Heart failure Father   . Cancer Father   . Diabetes Mother      History   Social History  . Marital Status: Widowed    Spouse Name: N/A    Number of Children: N/A  . Years of Education: N/A   Occupational History  . retired    Social History Main Topics  . Smoking status: Never Smoker   . Smokeless tobacco: Never Used  . Alcohol Use: No  . Drug Use: No  . Sexually Active: No   Other Topics Concern  . Not on file   Social History Narrative  . No narrative on file     BP 118/60  Pulse 67  Ht 5' (1.524 m)  Wt 153 lb (69.4 kg)  BMI 29.88 kg/m2  SpO2 96%  Physical Exam:  Well appearing elderly woman, NAD HEENT: Unremarkable Neck:  7 cm JVD, no thyromegally Back:  No CVA tenderness Lungs:  Clear with no wheezes HEART:  Regular rate rhythm, no murmurs, no rubs, no clicks Abd:  soft, positive bowel sounds, no organomegally, no rebound, no guarding Ext:  2 plus pulses, no edema, no cyanosis, no clubbing Skin:  No rashes no nodules Neuro:  CN II through XII intact, motor grossly intact   DEVICE  Normal device function.  See PaceArt for details.   Assess/Plan:

## 2012-10-25 NOTE — Patient Instructions (Signed)
Your physician recommends that you schedule a follow-up appointment in: 6 months with [name] in device clinic   Your physician wants you to follow-up in: 1 year with [name].  You will receive a reminder letter in the mail two months in advance. If you don't receive a letter, please call our office to schedule the follow-up appointment.

## 2012-10-25 NOTE — Assessment & Plan Note (Signed)
Her Medtronic DDD PPM is working normally. Will recheck in several months.

## 2012-10-27 ENCOUNTER — Ambulatory Visit (INDEPENDENT_AMBULATORY_CARE_PROVIDER_SITE_OTHER): Payer: Medicare Other | Admitting: *Deleted

## 2012-10-27 DIAGNOSIS — Z7901 Long term (current) use of anticoagulants: Secondary | ICD-10-CM

## 2012-10-27 LAB — POCT INR: INR: 3

## 2012-10-28 ENCOUNTER — Encounter: Payer: Self-pay | Admitting: Internal Medicine

## 2012-11-03 ENCOUNTER — Ambulatory Visit (INDEPENDENT_AMBULATORY_CARE_PROVIDER_SITE_OTHER): Payer: Medicare Other | Admitting: *Deleted

## 2012-11-03 DIAGNOSIS — Z7901 Long term (current) use of anticoagulants: Secondary | ICD-10-CM

## 2012-11-03 LAB — POCT INR: INR: 2.2

## 2012-11-09 ENCOUNTER — Encounter (HOSPITAL_COMMUNITY): Payer: Medicare Other | Attending: Hematology and Oncology

## 2012-11-09 ENCOUNTER — Encounter: Payer: Self-pay | Admitting: Cardiology

## 2012-11-09 ENCOUNTER — Ambulatory Visit: Payer: Medicare Other | Admitting: Cardiology

## 2012-11-09 ENCOUNTER — Ambulatory Visit (INDEPENDENT_AMBULATORY_CARE_PROVIDER_SITE_OTHER): Payer: Medicare Other | Admitting: Cardiology

## 2012-11-09 VITALS — BP 123/61 | HR 84 | Ht 60.0 in | Wt 155.8 lb

## 2012-11-09 DIAGNOSIS — I10 Essential (primary) hypertension: Secondary | ICD-10-CM

## 2012-11-09 DIAGNOSIS — C9110 Chronic lymphocytic leukemia of B-cell type not having achieved remission: Secondary | ICD-10-CM | POA: Insufficient documentation

## 2012-11-09 DIAGNOSIS — I350 Nonrheumatic aortic (valve) stenosis: Secondary | ICD-10-CM

## 2012-11-09 DIAGNOSIS — I359 Nonrheumatic aortic valve disorder, unspecified: Secondary | ICD-10-CM

## 2012-11-09 LAB — CBC WITH DIFFERENTIAL/PLATELET
Hemoglobin: 9.1 g/dL — ABNORMAL LOW (ref 12.0–15.0)
Lymphocytes Relative: 50 % — ABNORMAL HIGH (ref 12–46)
Lymphs Abs: 9.5 10*3/uL — ABNORMAL HIGH (ref 0.7–4.0)
Monocytes Relative: 5 % (ref 3–12)
Neutro Abs: 8.1 10*3/uL — ABNORMAL HIGH (ref 1.7–7.7)
Neutrophils Relative %: 43 % (ref 43–77)
RBC: 2.94 MIL/uL — ABNORMAL LOW (ref 3.87–5.11)

## 2012-11-09 NOTE — Progress Notes (Signed)
Patient ID: [name], female   DOB: 11/05/1915, 79 y.o.   MRN: [id number]  HPI: Scheduled return visit for dyspnea, likely multifactorial. She has not yet been evaluated by Hematology/Oncology, but a recent CBC shows some improvement despite no change in therapy.  She was evaluated by urology for persistent minor hematuria and urinary frequency. Vesicare and topical estrogen added to her medical regimen. She developed abdominal pain with an increase in her dose of diuretic prompting her to resume her previous dosage. She is experiencing malaise after 2 days of Vesicare and wonders if she will be able to tolerate this agent. She has not noted any change in urinary frequency.  Current Outpatient Prescriptions  Medication Sig Dispense Refill  . acetaminophen (TYLENOL) 500 MG tablet Take 1,000 mg by mouth 4 (four) times daily.        [name] albuterol (PROVENTIL HFA;VENTOLIN HFA) 108 (90 BASE) MCG/ACT inhaler Inhale 2 puffs into the lungs every 6 (six) hours as needed for wheezing.      . calcium-vitamin D (OSCAL WITH D) 500-200 MG-UNIT per tablet Take 1 tablet by mouth every morning.       [name] losartan (COZAAR) 100 MG tablet Take 100 mg by mouth daily.      . metoprolol tartrate (LOPRESSOR) 25 MG tablet Take 1 tablet (25 mg total) by mouth 2 (two) times daily.  180 tablet  3  . Multiple Vitamins-Minerals (MULTIVITAMIN WITH MINERALS) tablet Take 1 tablet by mouth every morning.       . pravastatin (PRAVACHOL) 40 MG tablet Take 40 mg by mouth at bedtime.       . torsemide (DEMADEX) 20 MG tablet Take 2 tablets (40 mg total) by mouth daily.  60 tablet  5  . verapamil (CALAN-SR) 180 MG CR tablet TAKE 1 TABLET (180 MG TOTAL)  BY MOUTH AT BEDTIME.  90 tablet  2  . VESICARE 5 MG tablet       . warfarin (COUMADIN) 2.5 MG tablet Take 1.25-2.5 mg by mouth every evening. 2.5 mg daily except she takes 1/2 tablet on thursdays      . nitroGLYCERIN (NITROSTAT) 0.4 MG SL tablet Place 1 tablet (0.4 mg total) under the  tongue every 5 (five) minutes as needed for chest pain.  25 tablet  11   No current facility-administered medications for this visit.   Facility-Administered Medications Ordered in Other Visits  Medication Dose Route Frequency Provider Last Rate Last Dose  . epoetin alfa (EPOGEN,PROCRIT) injection 40,000 Units  40,000 Units Subcutaneous Once Randall An, MD       Allergies  Allergen Reactions  . Detrol [Tolterodine Tartrate] Other (See Comments)    Patient states: "I was told to never take it again, I do not recall the reaction"  . Sanctura [Trospium Chloride] Nausea Only and Other (See Comments)    Intense lower abd pain  . Sulfa Antibiotics Other (See Comments)    Extreme dizziness  . Sulfonamide Derivatives Other (See Comments)    REACTION: GI distress     Past medical history, social history, and family history reviewed and updated.  ROS: Denies chest pain, lightheadedness or syncope. Dyspnea is unchanged, but tolerance of limited activity is improved. All other systems reviewed and are negative.  PHYSICAL EXAM: BP 123/61  Pulse 84  Ht 5' (1.524 m)  Wt 70.648 kg (155 lb 12 oz)  BMI 30.42 kg/m2;  Body mass index is 30.42 kg/(m^2). General-Well developed; no acute distress Body habitus-proportionate weight and  height Neck-No JVD; no carotid bruits Lungs-clear lung fields; resonant to percussion Cardiovascular-normal PMI; normal S1; decreased intensity of A2; grade 2-3/6 basilar systolic ejection murmur Abdomen-normal bowel sounds; soft and non-tender without masses or organomegaly Musculoskeletal-No deformities, no cyanosis or clubbing Neurologic-Normal cranial nerves; symmetric strength and tone Skin-Warm, no significant lesions Extremities-distal pulses intact; 1/2+ ankle and pedal edema  Broadwell Bing, MD 11/09/2012  7:04 PM  ASSESSMENT AND PLAN

## 2012-11-09 NOTE — Progress Notes (Deleted)
Name: [name]    DOB: 23-Oct-1915  Age: 79 y.o.  MR#: [id number]       PCP:  [name], MD      Insurance: Payor: MEDICARE / Plan: MEDICARE PART A AND B / Product Type: *No Product type* /   CC:   No chief complaint on file. LIST  VS Filed Vitals:   11/09/12 1502  BP: 123/61  Pulse: 84  Height: 5' (1.524 m)  Weight: 155 lb 12 oz (70.648 kg)    Weights Current Weight  11/09/12 155 lb 12 oz (70.648 kg)  10/25/12 153 lb (69.4 kg)  10/13/12 156 lb 6.4 oz (70.943 kg)    Blood Pressure  BP Readings from Last 3 Encounters:  11/09/12 123/61  10/25/12 118/60  10/13/12 132/64     Admit date:  (Not on file) Last encounter with RMR:  10/13/2012   Allergy Detrol; Sanctura; Sulfa antibiotics; and Sulfonamide derivatives  Current Outpatient Prescriptions  Medication Sig Dispense Refill  . acetaminophen (TYLENOL) 500 MG tablet Take 1,000 mg by mouth 4 (four) times daily.        [name] albuterol (PROVENTIL HFA;VENTOLIN HFA) 108 (90 BASE) MCG/ACT inhaler Inhale 2 puffs into the lungs every 6 (six) hours as needed for wheezing.      . calcium-vitamin D (OSCAL WITH D) 500-200 MG-UNIT per tablet Take 1 tablet by mouth every morning.       [name] losartan (COZAAR) 100 MG tablet Take 100 mg by mouth daily.      . metoprolol tartrate (LOPRESSOR) 25 MG tablet Take 1 tablet (25 mg total) by mouth 2 (two) times daily.  180 tablet  3  . Multiple Vitamins-Minerals (MULTIVITAMIN WITH MINERALS) tablet Take 1 tablet by mouth every morning.       . pravastatin (PRAVACHOL) 40 MG tablet Take 40 mg by mouth at bedtime.       . torsemide (DEMADEX) 20 MG tablet Take 2 tablets (40 mg total) by mouth daily.  60 tablet  5  . verapamil (CALAN-SR) 180 MG CR tablet TAKE 1 TABLET (180 MG TOTAL)  BY MOUTH AT BEDTIME.  90 tablet  2  . VESICARE 5 MG tablet       . warfarin (COUMADIN) 2.5 MG tablet Take 1.25-2.5 mg by mouth every evening. 2.5 mg daily except she takes 1/2 tablet on thursdays      . nitroGLYCERIN  (NITROSTAT) 0.4 MG SL tablet Place 1 tablet (0.4 mg total) under the tongue every 5 (five) minutes as needed for chest pain.  25 tablet  11   No current facility-administered medications for this visit.   Facility-Administered Medications Ordered in Other Visits  Medication Dose Route Frequency Provider Last Rate Last Dose  . epoetin alfa (EPOGEN,PROCRIT) injection 40,000 Units  40,000 Units Subcutaneous Once Randall An, MD        Discontinued Meds:    Medications Discontinued During This Encounter  Medication Reason  . cefpodoxime (VANTIN) 200 MG tablet Error  . pravastatin (PRAVACHOL) 40 MG tablet Error    Patient Active Problem List   Diagnosis Date Noted  . Anemia, normocytic normochromic 10/23/2012  . Fasting hyperglycemia 10/23/2012  . Hematuria 10/13/2012  . Pacemaker 10/15/2011  . Transient blindness 09/14/2011  . Paroxysmal atrial fibrillation 09/14/2011  . Aortic stenosis   . Congestive heart failure   . Chronic kidney disease   . Gastroesophageal reflux disease   . Chronic lymphocytic leukemia   . Chronic anticoagulation 06/10/2010  .  Arteriosclerotic cardiovascular disease (ASCVD) 03/22/2009  . HYPERLIPIDEMIA 12/17/2008  . HYPERTENSION 12/17/2008  . Sick sinus syndrome 11/15/2006    LABS    Component Value Date/Time   NA 143 10/20/2012 1420   NA 141 09/22/2012 0900   NA 144 09/07/2012 0930   K 4.8 10/20/2012 1420   K 4.4 09/22/2012 0900   K 4.4 09/07/2012 0930   CL 104 10/20/2012 1420   CL 107 09/22/2012 0900   CL 110 09/07/2012 0930   CO2 30 10/20/2012 1420   CO2 24 09/22/2012 0900   CO2 25 09/07/2012 0930   GLUCOSE 122* 10/20/2012 1420   GLUCOSE 122* 09/22/2012 0900   GLUCOSE 123* 09/07/2012 0930   BUN 71* 10/20/2012 1420   BUN 67* 09/22/2012 0900   BUN 60* 09/07/2012 0930   CREATININE 1.73* 10/20/2012 1420   CREATININE 1.95* 09/22/2012 0900   CREATININE 1.59* 09/07/2012 0930   CREATININE 1.50* 04/14/2012 0345   CREATININE 1.59* 04/08/2012 0336   CREATININE 1.64*  04/07/2012 0336   CALCIUM 9.1 10/20/2012 1420   CALCIUM 8.8 09/22/2012 0900   CALCIUM 9.2 09/07/2012 0930   GFRNONAA 29* 04/14/2012 0345   GFRNONAA 27* 04/08/2012 0336   GFRNONAA 26* 04/07/2012 0336   GFRAA 33* 04/14/2012 0345   GFRAA 31* 04/08/2012 0336   GFRAA 30* 04/07/2012 0336   CMP     Component Value Date/Time   NA 143 10/20/2012 1420   K 4.8 10/20/2012 1420   CL 104 10/20/2012 1420   CO2 30 10/20/2012 1420   GLUCOSE 122* 10/20/2012 1420   BUN 71* 10/20/2012 1420   CREATININE 1.73* 10/20/2012 1420   CREATININE 1.50* 04/14/2012 0345   CALCIUM 9.1 10/20/2012 1420   PROT 6.1 09/02/2012 1224   ALBUMIN 4.0 09/02/2012 1224   AST 16 09/02/2012 1224   ALT 10 09/02/2012 1224   ALKPHOS 80 09/02/2012 1224   BILITOT 0.4 09/02/2012 1224   GFRNONAA 29* 04/14/2012 0345   GFRAA 33* 04/14/2012 0345       Component Value Date/Time   WBC 19.0* 11/09/2012 1147   WBC 10.3 10/19/2012 1011   WBC 13.1* 09/22/2012 0900   HGB 9.1* 11/09/2012 1147   HGB 8.5* 10/19/2012 1011   HGB 8.6* 09/22/2012 0900   HCT 28.8* 11/09/2012 1147   HCT 26.3* 10/19/2012 1011   HCT 26.9* 09/22/2012 0900   MCV 98.0 11/09/2012 1147   MCV 93.9 10/19/2012 1011   MCV 94.7 09/22/2012 0900    Lipid Panel     Component Value Date/Time   CHOL 83 09/07/2012 0930   TRIG 126 09/07/2012 0930   HDL 28* 09/07/2012 0930   CHOLHDL 3.0 09/07/2012 0930   VLDL 25 09/07/2012 0930   LDLCALC 30 09/07/2012 0930    ABG    Component Value Date/Time   PHART 7.390 09/26/2012 1330   PCO2ART 38.3 09/26/2012 1330   PO2ART 60.0* 09/26/2012 1330   HCO3 21.8 09/26/2012 1339   TCO2 23 09/26/2012 1339   ACIDBASEDEF 4.0* 09/26/2012 1339   O2SAT 52.0 09/26/2012 1339     Lab Results  Component Value Date   TSH 2.391 09/02/2012   BNP (last 3 results)  Recent Labs  04/05/12 0350 04/05/12 1919 04/14/12 0345  PROBNP 1475.0* 1598.0* 442.2   Cardiac Panel (last 3 results) No results found for this basename: CKTOTAL, CKMB, TROPONINI, RELINDX,  in the last 72 hours   Iron/TIBC/Ferritin    Component Value Date/Time   IRON 49 05/01/2011 1510   TIBC 276 05/01/2011  1510   FERRITIN 178 05/01/2011 1510     EKG Orders placed in visit on 10/28/12  . EKG 12-LEAD     Prior Assessment and Plan Problem List as of 11/09/2012     Cardiovascular and Mediastinum   HYPERTENSION   Last Assessment & Plan   10/13/2012 Office Visit Written 10/13/2012 12:43 PM by Kathlen Brunswick, MD     Blood pressure control is good with current medications, which will be continued.    Arteriosclerotic cardiovascular disease (ASCVD)   Last Assessment & Plan   10/13/2012 Office Visit Written 10/13/2012 12:39 PM by Kathlen Brunswick, MD     Coronary disease of moderate severity is probably not contributing to her current symptoms. Consideration could be given to stress testing to rule out ischemia, but due to patient's advanced age and other issues that need attention, I am not inclined to proceed with such testing at present.    Sick sinus syndrome   Last Assessment & Plan   11/19/2011 Office Visit Written 11/20/2011  8:59 AM by Kathlen Brunswick, MD     Paroxysmal atrial fibrillation; dual-chamber pacemaker in 11/2006; Last evaluated by Dr. Ladona Ridgel in 10/2011-he will continue to follow her as appropriate.    Aortic stenosis   Last Assessment & Plan   10/13/2012 Office Visit Written 10/13/2012 12:38 PM by Kathlen Brunswick, MD     Aortic stenosis not found to be of adequate severity to justify percutaneous intervention. Nonetheless, it certainly may be contributing to her current symptoms. With high left atrial pressure determined at catheterization, we will proceed with additional diuresis.    Congestive heart failure   Last Assessment & Plan   09/02/2012 Office Visit Written 09/02/2012  1:47 PM by Kathlen Brunswick, MD     Patient does not appear to have active congestive heart failure at present. This will be reassessed with a chest x-ray and BNP level.    Paroxysmal atrial fibrillation    Last Assessment & Plan   10/25/2012 Office Visit Written 10/25/2012  3:28 PM by Marinus Maw, MD     She is maintaining NSR about 50% of the time. Her ventricular rate is well controlled and she will continue her current medical therapy with coumadin.      Digestive   Gastroesophageal reflux disease     Endocrine   Fasting hyperglycemia     Genitourinary   Chronic kidney disease   Last Assessment & Plan   10/13/2012 Office Visit Written 10/13/2012 12:41 PM by Kathlen Brunswick, MD     BUN and creatinine somewhat more elevated than in the past. Possible adverse effect of contrast administration. In the face of increasing dose of diuretic, we will follow electrolytes and renal function closely.      Other   HYPERLIPIDEMIA   Last Assessment & Plan   09/02/2012 Office Visit Written 09/02/2012  1:47 PM by Kathlen Brunswick, MD     Fasting lipid profile will be repeated.    Chronic anticoagulation   Last Assessment & Plan   09/02/2012 Office Visit Written 09/02/2012  1:44 PM by Kathlen Brunswick, MD     She has done well with anticoagulation despite progressive anemia. We will reassess a CBC, iron studies and FOBT.    Chronic lymphocytic leukemia   Last Assessment & Plan   10/13/2012 Office Visit Edited 10/14/2012  9:01 AM by Kathlen Brunswick, MD     Anemia is likely significantly contributing to her  symptoms. There is no compelling evidence for iron deficiency.  We will schedule an appointment for reassessment by Hematology-Oncology.  Transfusion, better control of CLL and/or treatment with an ESA will allow [place] to determine how much benefit can be achieved by increasing hemoglobin.    Transient blindness   Last Assessment & Plan   09/30/2011 Office Visit Edited 09/30/2011 11:42 AM by Dyann Kief, PA     Resolved and without recurrence    Pacemaker   Last Assessment & Plan   10/25/2012 Office Visit Written 10/25/2012  3:26 PM by Marinus Maw, MD     Her Medtronic DDD PPM is working  normally. Will recheck in several months.    Hematuria   Last Assessment & Plan   10/13/2012 Office Visit Written 10/13/2012 12:40 PM by Kathlen Brunswick, MD     Onset in approximately 2012. Patient is seeking reevaluation by Urology at [facility].    Anemia, normocytic normochromic       Imaging: No results found.

## 2012-11-09 NOTE — Assessment & Plan Note (Addendum)
No change in symptoms, which are likely multifactorial, and at least partially related to anemia, which has not yet been reevaluated. Since this visit was planned to review Hematology's assessment, no charge will be entered for today's evaluation.  She was previously treated with an ESA, and might benefit from resumption of that therapy. PFTs revealed nonspecific abnormalities without any definite significant intrinsic lung disease.

## 2012-11-09 NOTE — Patient Instructions (Addendum)
Your physician recommends that you schedule a follow-up appointment in: 4 months  Your physician recommends that you return for lab work in: ONE MONTH ( Your physician recommends that you have follow up lab work, we will mail you a reminder letter to alert you when to go [employer], located across the street from our office.)

## 2012-11-09 NOTE — Progress Notes (Signed)
Labs drawn today for cbc/diff 

## 2012-11-11 ENCOUNTER — Encounter (HOSPITAL_COMMUNITY): Payer: Self-pay

## 2012-11-11 ENCOUNTER — Encounter (HOSPITAL_BASED_OUTPATIENT_CLINIC_OR_DEPARTMENT_OTHER): Payer: Medicare Other

## 2012-11-11 VITALS — BP 130/69 | HR 86 | Temp 97.9°F | Resp 20 | Wt 155.0 lb

## 2012-11-11 DIAGNOSIS — N189 Chronic kidney disease, unspecified: Secondary | ICD-10-CM

## 2012-11-11 DIAGNOSIS — D649 Anemia, unspecified: Secondary | ICD-10-CM

## 2012-11-11 DIAGNOSIS — R5381 Other malaise: Secondary | ICD-10-CM

## 2012-11-11 DIAGNOSIS — C9110 Chronic lymphocytic leukemia of B-cell type not having achieved remission: Secondary | ICD-10-CM

## 2012-11-11 NOTE — Progress Notes (Signed)
[facility] [phone]   [phone]  OFFICE PROGRESS NOTE  [name], MD [address] [place] [address]  DIAGNOSIS: CLL   INTERVAL HISTORY:  She was diagnosed with CLLon 11/23/2007 .She has not received any specific antineoplastic therapy fot this. [name] who is a  79 y.o. female returns to the clinic today for scheduled follow up.  He complains her once was no fatigue with shortness of breath, and tells me that she's been feeling progressively worse over the last several months.  She denies night sweats or fever.  She states that time she was told by her cardiologist that she would need blood transfusion.  She complains of generalized feeling of well.  Previously she was on Procrit and stated that she felt slightly better  While she was on Procrit. Her last Procrit injection on record was in December 2012.   MEDICAL HISTORY: Past Medical History  Diagnosis Date  . Sick sinus syndrome 11/2006    paroxysmal atrial fibrillation; dual-chamber pacemaker in 11/2006  . Aortic stenosis     mild  . Left bundle branch block   . Congestive heart failure     Congestive heart failure with normal ejection fraction  . Arteriosclerotic cardiovascular disease (ASCVD)     coronary angio in 10/09:50% left anterior descending; 80% PDA; medical therapy advisedOrthostatic decrease in blood pressure  . Epistaxis     mild with negative ENT evaluation  . Dizziness     chronic  . Osteoarthritis   . Irritable bowel syndrome   . Urinary frequency     with incontinence  . Gastroesophageal reflux disease     Esophageal dilatation for stricture in 09/2011  . Rosacea   . Chronic lymphocytic leukemia     Stage I with anemia H./H. of 9/27 in 11/09 with normal MCV; iron deficiency in bone marrow  . Orthostatic hypotension   . Chronic anticoagulation   . Diverticulosis   . Adenomatous polyp October 2010    (Proximal Small bowel) With  high grade dysplasia in polypoid lesion straddling D1/D2  . Chronic kidney disease      Creatinine of 1.7 in 8/08, 1.4 in 10/08 and 1.5 in 11/09    ALLERGIES:  is allergic to vesicare; detrol; sanctura; sulfa antibiotics; and sulfonamide derivatives.  MEDICATIONS:  Current Outpatient Prescriptions  Medication Sig Dispense Refill  . acetaminophen (TYLENOL) 500 MG tablet Take 1,000 mg by mouth 4 (four) times daily.        [name] albuterol (PROVENTIL HFA;VENTOLIN HFA) 108 (90 BASE) MCG/ACT inhaler Inhale 2 puffs into the lungs every 6 (six) hours as needed for wheezing.      . calcium-vitamin D (OSCAL WITH D) 500-200 MG-UNIT per tablet Take 1 tablet by mouth every morning.       [name] estradiol (ESTRACE) 0.1 MG/GM vaginal cream Place 2 g vaginally daily.      [name] losartan (COZAAR) 100 MG tablet Take 100 mg by mouth daily.      . metoprolol tartrate (LOPRESSOR) 25 MG tablet Take 1 tablet (25 mg total) by mouth 2 (two) times daily.  180 tablet  3  . Multiple Vitamins-Minerals (MULTIVITAMIN WITH MINERALS) tablet Take 1 tablet by mouth every morning.       . pravastatin (PRAVACHOL) 40 MG tablet Take 40 mg by mouth at bedtime.       . torsemide (DEMADEX) 20 MG tablet Take 2 tablets (40 mg total)  by mouth daily.  60 tablet  5  . verapamil (CALAN-SR) 180 MG CR tablet TAKE 1 TABLET (180 MG TOTAL)  BY MOUTH AT BEDTIME.  90 tablet  2  . warfarin (COUMADIN) 2.5 MG tablet Take 1.25-2.5 mg by mouth every evening. 2.5 mg daily except she takes 1/2 tablet on thursdays      . nitroGLYCERIN (NITROSTAT) 0.4 MG SL tablet Place 1 tablet (0.4 mg total) under the tongue every 5 (five) minutes as needed for chest pain.  25 tablet  11  . VESICARE 5 MG tablet        No current facility-administered medications for this visit.   Facility-Administered Medications Ordered in Other Visits  Medication Dose Route Frequency Provider Last Rate Last Dose  . epoetin alfa (EPOGEN,PROCRIT) injection 40,000 Units  40,000 Units Subcutaneous  Once Randall An, MD        SURGICAL HISTORY:  Past Surgical History  Procedure Laterality Date  . Cholecystectomy    . Cervical spine surgery    . Lumbar disc surgery    . Bladder suspension    . Abdominal hysterectomy  1972  . Cataract extraction      Bilateral  . Esophagogastroduodenoscopy  01/09/09    Rourk -noncritical Schatzki's ring, small hiatal hernia, fundal gland polyps, bile-stained gastric mucosa, polypoid lesion straddling D1 and D2 1-2 cm, localized lymphangitic-appearing mucosa at D2 biopsied (adenomatous polyp with high grade glandular dysplasia  . A-v cardiac pacemaker insertion  11/2006    Medtronic     REVIEW OF SYSTEMS: 14 point review of system is as in the history above otherwise negative.  PHYSICAL EXAMINATION:  Blood pressure 130/69, pulse 86, temperature 97.9 F (36.6 C), temperature source Oral, resp. rate 20, weight 155 lb (70.308 kg), SpO2 95.00%. GENERAL: No acute distress. Elderly frail and was tearful during this encounter. SKIN:  No rashes or significant lesions . No ecchymosis or petechial rash. HEAD: Normocephalic, No masses, lesions, tenderness or abnormalities  EYES: Conjunctiva are pink and non-injected and no jaundice ENT: External ears normal ,lips, buccal mucosa, and tongue normal and mucous membranes are moist . No evidence of thrush. LYMPH: No palpable lymphadenopathy, in the neck, supraclavicular areas. There is palpable left axillary lymphadenopathy. LUNGS: Clear to auscultation , no crackles or wheezes HEART: regular rate & rhythm, 3/6 ejection systolic murmur , no gallops, S1 normal and S2 normal and no S3. ABDOMEN: Abdomen soft, non-tender, no masses or organomegaly and no hepatosplenomegaly palpable EXTREMITIES: No edema, no skin discoloration or tenderness NEURO: Alert & oriented .     LABORATORY DATA: Lab Results  Component Value Date   WBC 19.0* 11/09/2012   HGB 9.1* 11/09/2012   HCT 28.8* 11/09/2012   MCV 98.0  11/09/2012   PLT 219 11/09/2012      Chemistry      Component Value Date/Time   NA 143 10/20/2012 1420   K 4.8 10/20/2012 1420   CL 104 10/20/2012 1420   CO2 30 10/20/2012 1420   BUN 71* 10/20/2012 1420   CREATININE 1.73* 10/20/2012 1420   CREATININE 1.50* 04/14/2012 0345      Component Value Date/Time   CALCIUM 9.1 10/20/2012 1420   ALKPHOS 80 09/02/2012 1224   AST 16 09/02/2012 1224   ALT 10 09/02/2012 1224   BILITOT 0.4 09/02/2012 1224       RADIOGRAPHIC STUDIES: No results found.   ASSESSMENT:  CLL with Anemia and worsening fatigue and hepatomegaly. I doubt that Anemia is entirely responsible  for her she feels given that she has been anemic for protracted length of time besides she is not critically anemic.  Think that treatment would be very reasonable at this stage.  She is unable to get a CT with contrast is due to baseline kidney impairment and the role of PET scan is poorly defined in CLL.  I think a noncontrast CT will suffice. The patient is fairly very functional at her age and I think that she could  potentially benefit from treatment.  Her chronic kidney disease may also be contributing to the anemia. Also there is a chance that some of her dyspnea may be in part to her heart issues, however I think it is a significant contribution by his oncologic condition.   PLAN:  1. CT of the chest abdomen and pelvis.   2. I recommended treatment with Obinutuzumab/chlorambucil which is a regimen of CHOP of choice in patients with comorbidities . Obinutuzumab will be given on days 1,8 and 15 of cycle 1 and subsequently on day 1 of cycle 2-6 while chlorambucil beginning at 0.5 mg per kilogram body weight and is on a 15 of cycle 1.-6.  This will be cycled every 28 days. 3. Return to clinic in 2 weeks. 4.  Recheck a uric acid CBC CMP prior to beginning chemotherapy.     All questions were satisfactorily answered. Patient knows to call if  any concern arises.  I spent more than 50 % counseling  the patient face to face. The total time spent in the appointment was 30 minutes.   [name] Hammers, MD FACP. Hematology/Oncology.

## 2012-11-11 NOTE — Patient Instructions (Addendum)
[facility] Discharge Instructions  RECOMMENDATIONS MADE BY THE CONSULTANT AND ANY TEST RESULTS WILL BE SENT TO YOUR REFERRING PHYSICIAN.  EXAM FINDINGS BY THE PHYSICIAN TODAY AND SIGNS OR SYMPTOMS TO REPORT TO CLINIC OR PRIMARY PHYSICIAN: Exam and discussion by Dr. Lazarus Salines.  Does not think your symptoms are related to your anemia.  Your hemoglobin has been at this level for a long time. Feels that your CLL has become more active and that is what is causing your symptoms.  Prefers to treat your CLL with 2 drugs. Chlorambucil - oral and Gazya (Obinutuzumab) - intravenous.  This would be given monthly.  Will need to get a port-a-catheter or PICC line placed before starting treatment.  Surgeons in town are Dr. Malvin Johns, Dr. Lovell Sheehan and Dr. Suzette Battiest. Or we can schedule you to have a port place through Interventional Radiology in [place]. Talk with your son and let us know who you want and we will make the referral. Also need to do scans of your chest, abdomen and pelvis and we will do them next Tuesday here at [facility].  MEDICATIONS PRESCRIBED:  none  INSTRUCTIONS GIVEN AND DISCUSSED: Call Tobie Lords, RN at [phone].  SPECIAL INSTRUCTIONS/FOLLOW-UP: Chemotherapy to be arranged and to be seen by MD in 2 weeks.  Thank you for choosing [facility] to provide your oncology and hematology care.  To afford each patient quality time with our providers, please arrive at least 15 minutes before your scheduled appointment time.  With your help, our goal is to use those 15 minutes to complete the necessary work-up to ensure our physicians have the information they need to help with your evaluation and healthcare recommendations.    Effective January 1st, 2014, we ask that you re-schedule your appointment with our physicians should you arrive 10 or more minutes late for your appointment.  We strive to give you quality time with our providers, and arriving late affects  you and other patients whose appointments are after yours.    Again, thank you for choosing [facility].  Our hope is that these requests will decrease the amount of time that you wait before being seen by our physicians.       _____________________________________________________________  Should you have questions after your visit to [facility], please contact our office at [phone] between the hours of 8:30 a.m. and 5:00 p.m.  Voicemails left after 4:30 p.m. will not be returned until the following business day.  For prescription refill requests, have your pharmacy contact our office with your prescription refill request.

## 2012-11-15 ENCOUNTER — Ambulatory Visit (HOSPITAL_COMMUNITY): Payer: Medicare Other

## 2012-11-15 ENCOUNTER — Encounter (HOSPITAL_COMMUNITY): Payer: Medicare Other | Attending: Hematology and Oncology

## 2012-11-15 ENCOUNTER — Ambulatory Visit (HOSPITAL_COMMUNITY)
Admission: RE | Admit: 2012-11-15 | Discharge: 2012-11-15 | Disposition: A | Discharge status: Home / Self Care | Payer: Medicare Other | Source: Ambulatory Visit | Attending: Hematology and Oncology | Admitting: Hematology and Oncology

## 2012-11-15 DIAGNOSIS — J90 Pleural effusion, not elsewhere classified: Secondary | ICD-10-CM | POA: Insufficient documentation

## 2012-11-15 DIAGNOSIS — C9110 Chronic lymphocytic leukemia of B-cell type not having achieved remission: Secondary | ICD-10-CM | POA: Insufficient documentation

## 2012-11-15 DIAGNOSIS — R918 Other nonspecific abnormal finding of lung field: Secondary | ICD-10-CM | POA: Insufficient documentation

## 2012-11-15 DIAGNOSIS — I359 Nonrheumatic aortic valve disorder, unspecified: Secondary | ICD-10-CM | POA: Insufficient documentation

## 2012-11-15 LAB — COMPREHENSIVE METABOLIC PANEL
ALT: 9 U/L (ref 0–35)
AST: 16 U/L (ref 0–37)
CO2: 26 mEq/L (ref 19–32)
Calcium: 9.3 mg/dL (ref 8.4–10.5)
Chloride: 101 mEq/L (ref 96–112)
GFR calc non Af Amer: 21 mL/min — ABNORMAL LOW (ref >90)
Potassium: 4 mEq/L (ref 3.5–5.1)
Sodium: 138 mEq/L (ref 135–145)
Total Bilirubin: 0.3 mg/dL (ref 0.3–1.2)

## 2012-11-15 LAB — CBC WITH DIFFERENTIAL/PLATELET
Basophils Relative: 1 % (ref 0–1)
Eosinophils Absolute: 0.6 10*3/uL (ref 0.0–0.7)
Hemoglobin: 9 g/dL — ABNORMAL LOW (ref 12.0–15.0)
MCH: 30 pg (ref 26.0–34.0)
MCHC: 30.6 g/dL (ref 30.0–36.0)
Monocytes Absolute: 0.7 10*3/uL (ref 0.1–1.0)
Neutrophils Relative %: 46 % (ref 43–77)

## 2012-11-15 LAB — URIC ACID: Uric Acid, Serum: 7.8 mg/dL — ABNORMAL HIGH (ref 2.4–7.0)

## 2012-11-15 NOTE — Progress Notes (Signed)
[name] presented for [employer]. Labs per MD order drawn via Peripheral Line 25 gauge needle inserted in rt ac.  Good blood return present. Procedure without incident.  Needle removed intact. Patient tolerated procedure well.

## 2012-11-17 ENCOUNTER — Ambulatory Visit (INDEPENDENT_AMBULATORY_CARE_PROVIDER_SITE_OTHER): Payer: Medicare Other | Admitting: *Deleted

## 2012-11-17 ENCOUNTER — Other Ambulatory Visit (HOSPITAL_COMMUNITY): Payer: Self-pay | Admitting: Oncology

## 2012-11-17 DIAGNOSIS — Z7901 Long term (current) use of anticoagulants: Secondary | ICD-10-CM

## 2012-11-17 DIAGNOSIS — C9110 Chronic lymphocytic leukemia of B-cell type not having achieved remission: Secondary | ICD-10-CM

## 2012-11-17 LAB — POCT INR: INR: 2.5

## 2012-11-21 ENCOUNTER — Telehealth: Payer: Self-pay | Admitting: *Deleted

## 2012-11-21 NOTE — Telephone Encounter (Signed)
Pt needs to be scheduled for port a cath placement by intervention radiology.  Pt needs to be off coumadin 5 days.  Ok for pt to hold coumadin 5 days and resume night of procedure.  Will need INR check 5 - 7 days after restarting coumadin.

## 2012-11-22 ENCOUNTER — Other Ambulatory Visit (HOSPITAL_COMMUNITY): Payer: Self-pay | Admitting: Hematology and Oncology

## 2012-11-24 ENCOUNTER — Other Ambulatory Visit: Payer: Self-pay | Admitting: Radiology

## 2012-11-25 ENCOUNTER — Encounter (HOSPITAL_BASED_OUTPATIENT_CLINIC_OR_DEPARTMENT_OTHER): Payer: Medicare Other

## 2012-11-25 ENCOUNTER — Encounter: Payer: Self-pay | Admitting: Adult Health

## 2012-11-25 ENCOUNTER — Encounter (HOSPITAL_COMMUNITY): Payer: Self-pay

## 2012-11-25 ENCOUNTER — Ambulatory Visit (INDEPENDENT_AMBULATORY_CARE_PROVIDER_SITE_OTHER): Payer: Medicare Other | Admitting: Adult Health

## 2012-11-25 ENCOUNTER — Other Ambulatory Visit: Payer: Self-pay | Admitting: Cardiology

## 2012-11-25 VITALS — BP 116/62 | HR 62 | Temp 98.8°F | Resp 20 | Wt 153.9 lb

## 2012-11-25 VITALS — BP 126/56 | HR 71 | Ht 60.0 in | Wt 153.6 lb

## 2012-11-25 DIAGNOSIS — I359 Nonrheumatic aortic valve disorder, unspecified: Secondary | ICD-10-CM

## 2012-11-25 DIAGNOSIS — I509 Heart failure, unspecified: Secondary | ICD-10-CM

## 2012-11-25 DIAGNOSIS — C9110 Chronic lymphocytic leukemia of B-cell type not having achieved remission: Secondary | ICD-10-CM

## 2012-11-25 DIAGNOSIS — I350 Nonrheumatic aortic (valve) stenosis: Secondary | ICD-10-CM

## 2012-11-25 DIAGNOSIS — I480 Paroxysmal atrial fibrillation: Secondary | ICD-10-CM

## 2012-11-25 DIAGNOSIS — I10 Essential (primary) hypertension: Secondary | ICD-10-CM

## 2012-11-25 DIAGNOSIS — I4891 Unspecified atrial fibrillation: Secondary | ICD-10-CM

## 2012-11-25 LAB — CBC WITH DIFFERENTIAL/PLATELET
Basophils Absolute: 0.1 10*3/uL (ref 0.0–0.1)
HCT: 29 % — ABNORMAL LOW (ref 36.0–46.0)
Hemoglobin: 9 g/dL — ABNORMAL LOW (ref 12.0–15.0)
Lymphocytes Relative: 38 % (ref 12–46)
Monocytes Absolute: 0.6 10*3/uL (ref 0.1–1.0)
Monocytes Relative: 5 % (ref 3–12)
Neutro Abs: 7.5 10*3/uL (ref 1.7–7.7)
RBC: 2.99 MIL/uL — ABNORMAL LOW (ref 3.87–5.11)
WBC: 13.9 10*3/uL — ABNORMAL HIGH (ref 4.0–10.5)

## 2012-11-25 LAB — COMPREHENSIVE METABOLIC PANEL
BUN: 65 mg/dL — ABNORMAL HIGH (ref 6–23)
Calcium: 9.5 mg/dL (ref 8.4–10.5)
GFR calc Af Amer: 27 mL/min — ABNORMAL LOW (ref >90)
Glucose, Bld: 126 mg/dL — ABNORMAL HIGH (ref 70–99)
Sodium: 138 mEq/L (ref 135–145)
Total Protein: 7.1 g/dL (ref 6.0–8.3)

## 2012-11-25 LAB — PRO B NATRIURETIC PEPTIDE: Pro B Natriuretic peptide (BNP): 2980 pg/mL — ABNORMAL HIGH (ref 0–450)

## 2012-11-25 NOTE — Patient Instructions (Addendum)
[facility] Discharge Instructions  RECOMMENDATIONS MADE BY THE CONSULTANT AND ANY TEST RESULTS WILL BE SENT TO YOUR REFERRING PHYSICIAN.  EXAM FINDINGS BY THE PHYSICIAN TODAY AND SIGNS OR SYMPTOMS TO REPORT TO CLINIC OR PRIMARY PHYSICIAN: Exam and discussion by Dr. Sharia Reeve.  Based upon scan results, MD feels that symptoms are more related to your heart problems than your cancer.  We are going to make a consult with cardiology to see if they can  Improve your symptoms.  Will cancel port placement and chemotherapy for now.  MEDICATIONS PRESCRIBED:  None  Resume your coumadin as before  INSTRUCTIONS GIVEN AND DISCUSSED: 2 D echocardiogram on 12/01/12 at 2 pm.   SPECIAL INSTRUCTIONS/FOLLOW-UP: Follow-up here in 1 month.  Thank you for choosing [name] to provide your oncology and hematology care.  To afford each patient quality time with our providers, please arrive at least 15 minutes before your scheduled appointment time.  With your help, our goal is to use those 15 minutes to complete the necessary work-up to ensure our physicians have the information they need to help with your evaluation and healthcare recommendations.    Effective January 1st, 2014, we ask that you re-schedule your appointment with our physicians should you arrive 10 or more minutes late for your appointment.  We strive to give you quality time with our providers, and arriving late affects you and other patients whose appointments are after yours.    Again, thank you for choosing [facility].  Our hope is that these requests will decrease the amount of time that you wait before being seen by our physicians.       _____________________________________________________________  Should you have questions after your visit to [facility], please contact our office at [phone] between the hours of 8:30 a.m. and 5:00 p.m.  Voicemails left after 4:30 p.m. will  not be returned until the following business day.  For prescription refill requests, have your pharmacy contact our office with your prescription refill request.

## 2012-11-25 NOTE — Assessment & Plan Note (Signed)
She continues to have dyspnea which she feels is worsening. Per earlier records, she is not a candidate for TAVI or AoV repair. Repeat echo is scheduled for next week, with follow up with Dr. Purvis Sheffield. She is not taking torsemide as directed. I have convinced her to take 20 mg in am, and 1/2 tablet in the pm, in hopes that this will assist with fluid. She is not ready to talk about Hospice.

## 2012-11-25 NOTE — Patient Instructions (Signed)
Your physician recommends that you schedule a follow-up appointment in: 2 weeks with Dr Purvis Sheffield

## 2012-11-25 NOTE — Progress Notes (Signed)
HPI: [name] is an anxious 79 y/o former patient of Dr. Dietrich Pates, we are seeing for ongoing assessment and treatment of chronic dyspnea and AoV stenosis, not found to be a candidate for surgery at this time.  She was being seen in oncology office today and was complaining of worsening dyspnea, she was sent to see cardiology today. They were to put a porta-a-cath in for treatment of CLL, but have chosen not to do this due to her cardiac issues. They have recommended Hospice, which has greatly upset the patient and has caused a great deal of anxiety.      Recent CT scan 11/15/2012 demonstrated, abdominal and pelvic ascites.No adenopathy identified. There is no splenomegaly.. Chronic increased caliber of the common bile duct status post cholecystectomy.  Recent echo 08/2012, demonstrated Aortic valve: Mildly to moderately calcified annulus. Moderately thickened, moderately calcified leaflets. Cusp separation was moderately reduced. There was moderate stenosis. Mild regurgitation. Valve area: 0.51cm^2(VTI). Valve area: 0.51cm^2 (Vmax). She has been seen in the past by Dr. Excell Seltzer who completed cardiac cath. She was not found to have adequate stenosis to warrant TAVI.    She is not taking toresemide as directed, only taking 20 mg daily instead of 40 mg as directed, as she states this causes her to have abdominal pain.   Allergies  Allergen Reactions  . Vesicare [Solifenacin] Other (See Comments)    Severe stomach pain  . Detrol [Tolterodine Tartrate] Other (See Comments)    Patient states: "I was told to never take it again, I do not recall the reaction"  . [place] [Trospium Chloride] Nausea Only and Other (See Comments)    Intense lower abd pain  . Sulfa Antibiotics Other (See Comments)    Extreme dizziness  . Sulfonamide Derivatives Other (See Comments)    REACTION: GI distress  . Trimethoprim     Other reaction(s): Dermatitis    Current Outpatient Prescriptions  Medication Sig Dispense Refill    . acetaminophen (TYLENOL) 500 MG tablet Take 1,000 mg by mouth 4 (four) times daily.        [name] albuterol (PROVENTIL HFA;VENTOLIN HFA) 108 (90 BASE) MCG/ACT inhaler Inhale 2 puffs into the lungs every 6 (six) hours as needed for wheezing.      . calcium-vitamin D (OSCAL WITH D) 500-200 MG-UNIT per tablet Take 1 tablet by mouth every morning.       [name] estradiol (ESTRACE) 0.1 MG/GM vaginal cream Place 2 g vaginally daily.      [name] losartan (COZAAR) 100 MG tablet Take 100 mg by mouth daily.      . metoprolol tartrate (LOPRESSOR) 25 MG tablet TAKE 1 TABLET (25 MG TOTAL) BY MOUTH 2 (TWO) TIMES DAILY.  180 tablet  2  . Multiple Vitamins-Minerals (MULTIVITAMIN WITH MINERALS) tablet Take 1 tablet by mouth every morning.       . nitroGLYCERIN (NITROSTAT) 0.4 MG SL tablet Place 1 tablet (0.4 mg total) under the tongue every 5 (five) minutes as needed for chest pain.  25 tablet  11  . pravastatin (PRAVACHOL) 40 MG tablet Take 40 mg by mouth at bedtime.       . torsemide (DEMADEX) 20 MG tablet Take 20 mg by mouth daily.      . verapamil (CALAN-SR) 180 MG CR tablet TAKE 1 TABLET (180 MG TOTAL)  BY MOUTH AT BEDTIME.  90 tablet  2  . VESICARE 5 MG tablet       . warfarin (COUMADIN) 2.5 MG tablet Take 1.25-2.5  mg by mouth every evening. 2.5 mg daily except she takes 1/2 tablet on thursdays      . warfarin (COUMADIN) 2.5 MG tablet TAKE 1 TABLET (2.5 MG TOTAL)  BY MOUTH AS DIRECTED.  90 tablet  2   No current facility-administered medications for this visit.   Facility-Administered Medications Ordered in Other Visits  Medication Dose Route Frequency Provider Last Rate Last Dose  . epoetin alfa (EPOGEN,PROCRIT) injection 40,000 Units  40,000 Units Subcutaneous Once Randall An, MD        Past Medical History  Diagnosis Date  . Sick sinus syndrome 11/2006    paroxysmal atrial fibrillation; dual-chamber pacemaker in 11/2006  . Aortic stenosis     mild  . Left bundle branch block   . Congestive heart failure      Congestive heart failure with normal ejection fraction  . Arteriosclerotic cardiovascular disease (ASCVD)     coronary angio in 10/09:50% left anterior descending; 80% PDA; medical therapy advisedOrthostatic decrease in blood pressure  . Epistaxis     mild with negative ENT evaluation  . Dizziness     chronic  . Osteoarthritis   . Irritable bowel syndrome   . Urinary frequency     with incontinence  . Gastroesophageal reflux disease     Esophageal dilatation for stricture in 09/2011  . Rosacea   . Chronic lymphocytic leukemia     Stage I with anemia H./H. of 9/27 in 11/09 with normal MCV; iron deficiency in bone marrow  . Orthostatic hypotension   . Chronic anticoagulation   . Diverticulosis   . 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 Chronic kidney disease      Creatinine of 1.7 in 8/08, 1.4 in 10/08 and 1.5 in 11/09  . Urethral caruncle     Past Surgical History  Procedure Laterality Date  . Cholecystectomy    . Cervical spine surgery    . Lumbar disc surgery    . Bladder suspension    . Abdominal hysterectomy  1972  . Cataract extraction      Bilateral  . Esophagogastroduodenoscopy  01/09/09    Rourk -noncritical Schatzki's ring, small hiatal hernia, fundal gland polyps, bile-stained gastric mucosa, polypoid lesion straddling D1 and D2 1-2 cm, localized lymphangitic-appearing mucosa at D2 biopsied (adenomatous polyp with high grade glandular dysplasia  . A-v cardiac pacemaker insertion  11/2006    [employer]    JYN:WGNFAO of systems complete and found to be negative unless listed above  PHYSICAL EXAM BP 126/56  Pulse 71  Ht 5' (1.524 m)  Wt 153 lb 9.6 oz (69.673 kg)  BMI 30 kg/m2  SpO2 96%  General: Well developed, well nourished, in no acute distress Head: Eyes PERRLA, No xanthomas.   Normal cephalic and atramatic  Lungs: Clear bilaterally to auscultation and percussion. Heart:  HRRR S1 S2, high pitched systolic murmur at the RSB and apex..  Pulses are 2+ & equal.            Bilateral radiation carotid bruit. No JVD.  No abdominal bruits. No femoral bruits. Abdomen: Bowel sounds are positive, abdomen mildly distended,  and non-tender without masses or                  Hernia's noted. Msk:  Back normal, normal gait. Normal strength and tone for age. Extremities: No clubbing, cyanosis or edema.  DP +1 Neuro: Alert and oriented  X 3. Psych:  Tearful and anxious affect, responds appropriately   ASSESSMENT AND PLAN

## 2012-11-25 NOTE — Progress Notes (Deleted)
Name: [name]    DOB: 10-Jul-1915  Age: 79 y.o.  MR#: [id number]       PCP:  [name], MD      Insurance: Payor: MEDICARE / Plan: MEDICARE PART A AND B / Product Type: *No Product type* /   CC:   No chief complaint on file.   VS Filed Vitals:   11/25/12 1314  BP: 126/56  Pulse: 71  Height: 5' (1.524 m)  Weight: 153 lb 9.6 oz (69.673 kg)  SpO2: 96%    Weights Current Weight  11/25/12 153 lb 9.6 oz (69.673 kg)  11/25/12 153 lb 14.4 oz (69.809 kg)  11/11/12 155 lb (70.308 kg)    Blood Pressure  BP Readings from Last 3 Encounters:  11/25/12 126/56  11/25/12 116/62  11/11/12 130/69     Admit date:  (Not on file) Last encounter with RMR:  Visit date not found   Allergy Vesicare; Detrol; [place]; Sulfa antibiotics; Sulfonamide derivatives; and Trimethoprim  Current Outpatient Prescriptions  Medication Sig Dispense Refill  . acetaminophen (TYLENOL) 500 MG tablet Take 1,000 mg by mouth 4 (four) times daily.        [name] albuterol (PROVENTIL HFA;VENTOLIN HFA) 108 (90 BASE) MCG/ACT inhaler Inhale 2 puffs into the lungs every 6 (six) hours as needed for wheezing.      . calcium-vitamin D (OSCAL WITH D) 500-200 MG-UNIT per tablet Take 1 tablet by mouth every morning.       [name] estradiol (ESTRACE) 0.1 MG/GM vaginal cream Place 2 g vaginally daily.      [name] losartan (COZAAR) 100 MG tablet Take 100 mg by mouth daily.      . metoprolol tartrate (LOPRESSOR) 25 MG tablet TAKE 1 TABLET (25 MG TOTAL) BY MOUTH 2 (TWO) TIMES DAILY.  180 tablet  2  . Multiple Vitamins-Minerals (MULTIVITAMIN WITH MINERALS) tablet Take 1 tablet by mouth every morning.       . nitroGLYCERIN (NITROSTAT) 0.4 MG SL tablet Place 1 tablet (0.4 mg total) under the tongue every 5 (five) minutes as needed for chest pain.  25 tablet  11  . pravastatin (PRAVACHOL) 40 MG tablet Take 40 mg by mouth at bedtime.       . torsemide (DEMADEX) 20 MG tablet Take 20 mg by mouth daily.      . verapamil (CALAN-SR) 180 MG CR  tablet TAKE 1 TABLET (180 MG TOTAL)  BY MOUTH AT BEDTIME.  90 tablet  2  . VESICARE 5 MG tablet       . warfarin (COUMADIN) 2.5 MG tablet Take 1.25-2.5 mg by mouth every evening. 2.5 mg daily except she takes 1/2 tablet on thursdays      . warfarin (COUMADIN) 2.5 MG tablet TAKE 1 TABLET (2.5 MG TOTAL)  BY MOUTH AS DIRECTED.  90 tablet  2   No current facility-administered medications for this visit.   Facility-Administered Medications Ordered in Other Visits  Medication Dose Route Frequency Provider Last Rate Last Dose  . epoetin alfa (EPOGEN,PROCRIT) injection 40,000 Units  40,000 Units Subcutaneous Once Randall An, MD        Discontinued Meds:    Medications Discontinued During This Encounter  Medication Reason  . torsemide (DEMADEX) 20 MG tablet     Patient Active Problem List   Diagnosis Date Noted  . Anemia, normocytic normochromic 10/23/2012  . Fasting hyperglycemia 10/23/2012  . Hematuria 10/13/2012  . Pacemaker 10/15/2011  . Transient blindness 09/14/2011  . Paroxysmal atrial fibrillation 09/14/2011  .  Aortic stenosis   . Congestive heart failure   . Chronic kidney disease   . Gastroesophageal reflux disease   . Chronic lymphocytic leukemia   . Chronic anticoagulation 06/10/2010  . Arteriosclerotic cardiovascular disease (ASCVD) 03/22/2009  . HYPERLIPIDEMIA 12/17/2008  . HYPERTENSION 12/17/2008  . Sick sinus syndrome 11/15/2006    LABS    Component Value Date/Time   NA 138 11/15/2012 1500   NA 143 10/20/2012 1420   NA 141 09/22/2012 0900   K 4.0 11/15/2012 1500   K 4.8 10/20/2012 1420   K 4.4 09/22/2012 0900   CL 101 11/15/2012 1500   CL 104 10/20/2012 1420   CL 107 09/22/2012 0900   CO2 26 11/15/2012 1500   CO2 30 10/20/2012 1420   CO2 24 09/22/2012 0900   GLUCOSE 104* 11/15/2012 1500   GLUCOSE 122* 10/20/2012 1420   GLUCOSE 122* 09/22/2012 0900   BUN 66* 11/15/2012 1500   BUN 71* 10/20/2012 1420   BUN 67* 09/22/2012 0900   CREATININE 1.92* 11/15/2012 1500   CREATININE  1.73* 10/20/2012 1420   CREATININE 1.95* 09/22/2012 0900   CREATININE 1.59* 09/07/2012 0930   CREATININE 1.50* 04/14/2012 0345   CREATININE 1.59* 04/08/2012 0336   CALCIUM 9.3 11/15/2012 1500   CALCIUM 9.1 10/20/2012 1420   CALCIUM 8.8 09/22/2012 0900   GFRNONAA 21* 11/15/2012 1500   GFRNONAA 29* 04/14/2012 0345   GFRNONAA 27* 04/08/2012 0336   GFRAA 25* 11/15/2012 1500   GFRAA 33* 04/14/2012 0345   GFRAA 31* 04/08/2012 0336   CMP     Component Value Date/Time   NA 138 11/15/2012 1500   K 4.0 11/15/2012 1500   CL 101 11/15/2012 1500   CO2 26 11/15/2012 1500   GLUCOSE 104* 11/15/2012 1500   BUN 66* 11/15/2012 1500   CREATININE 1.92* 11/15/2012 1500   CREATININE 1.73* 10/20/2012 1420   CALCIUM 9.3 11/15/2012 1500   PROT 7.0 11/15/2012 1500   ALBUMIN 3.7 11/15/2012 1500   AST 16 11/15/2012 1500   ALT 9 11/15/2012 1500   ALKPHOS 94 11/15/2012 1500   BILITOT 0.3 11/15/2012 1500   GFRNONAA 21* 11/15/2012 1500   GFRAA 25* 11/15/2012 1500       Component Value Date/Time   WBC 14.1* 11/15/2012 1500   WBC 19.0* 11/09/2012 1147   WBC 10.3 10/19/2012 1011   HGB 9.0* 11/15/2012 1500   HGB 9.1* 11/09/2012 1147   HGB 8.5* 10/19/2012 1011   HCT 29.4* 11/15/2012 1500   HCT 28.8* 11/09/2012 1147   HCT 26.3* 10/19/2012 1011   MCV 98.0 11/15/2012 1500   MCV 98.0 11/09/2012 1147   MCV 93.9 10/19/2012 1011    Lipid Panel     Component Value Date/Time   CHOL 83 09/07/2012 0930   TRIG 126 09/07/2012 0930   HDL 28* 09/07/2012 0930   CHOLHDL 3.0 09/07/2012 0930   VLDL 25 09/07/2012 0930   LDLCALC 30 09/07/2012 0930    ABG    Component Value Date/Time   PHART 7.390 09/26/2012 1330   PCO2ART 38.3 09/26/2012 1330   PO2ART 60.0* 09/26/2012 1330   HCO3 21.8 09/26/2012 1339   TCO2 23 09/26/2012 1339   ACIDBASEDEF 4.0* 09/26/2012 1339   O2SAT 52.0 09/26/2012 1339     Lab Results  Component Value Date   TSH 2.391 09/02/2012   BNP (last 3 results)  Recent Labs  04/05/12 0350 04/05/12 1919 04/14/12 0345  PROBNP 1475.0* 1598.0* 442.2   Cardiac Panel  (last 3 results) No  results found for this basename: CKTOTAL, CKMB, TROPONINI, RELINDX,  in the last 72 hours  Iron/TIBC/Ferritin    Component Value Date/Time   IRON 49 05/01/2011 1510   TIBC 276 05/01/2011 1510   FERRITIN 178 05/01/2011 1510     EKG Orders placed in visit on 10/28/12  . EKG 12-LEAD     Prior Assessment and Plan Problem List as of 11/25/2012   HYPERLIPIDEMIA   Last Assessment & Plan   09/02/2012 Office Visit Written 09/02/2012  1:47 PM by Kathlen Brunswick, MD     Fasting lipid profile will be repeated.    HYPERTENSION   Last Assessment & Plan   10/13/2012 Office Visit Written 10/13/2012 12:43 PM by Kathlen Brunswick, MD     Blood pressure control is good with current medications, which will be continued.    Arteriosclerotic cardiovascular disease (ASCVD)   Last Assessment & Plan   10/13/2012 Office Visit Written 10/13/2012 12:39 PM by Kathlen Brunswick, MD     Coronary disease of moderate severity is probably not contributing to her current symptoms. Consideration could be given to stress testing to rule out ischemia, but due to patient's advanced age and other issues that need attention, I am not inclined to proceed with such testing at present.    Chronic anticoagulation   Last Assessment & Plan   09/02/2012 Office Visit Written 09/02/2012  1:44 PM by Kathlen Brunswick, MD     She has done well with anticoagulation despite progressive anemia. We will reassess a CBC, iron studies and FOBT.    Sick sinus syndrome   Last Assessment & Plan   11/19/2011 Office Visit Written 11/20/2011  8:59 AM by Kathlen Brunswick, MD     Paroxysmal atrial fibrillation; dual-chamber pacemaker in 11/2006; Last evaluated by Dr. Ladona Ridgel in 10/2011-he will continue to follow her as appropriate.    Aortic stenosis   Last Assessment & Plan   11/09/2012 Office Visit Edited 11/09/2012  7:11 PM by Kathlen Brunswick, MD     No change in symptoms, which are likely multifactorial, and at least partially  related to anemia, which has not yet been reevaluated. Since this visit was planned to review Hematology's assessment, no charge will be entered for today's evaluation.  She was previously treated with an ESA, and might benefit from resumption of that therapy. PFTs revealed nonspecific abnormalities without any definite significant intrinsic lung disease.    Congestive heart failure   Last Assessment & Plan   09/02/2012 Office Visit Written 09/02/2012  1:47 PM by Kathlen Brunswick, MD     Patient does not appear to have active congestive heart failure at present. This will be reassessed with a chest x-ray and BNP level.    Chronic kidney disease   Last Assessment & Plan   10/13/2012 Office Visit Written 10/13/2012 12:41 PM by Kathlen Brunswick, MD     BUN and creatinine somewhat more elevated than in the past. Possible adverse effect of contrast administration. In the face of increasing dose of diuretic, we will follow electrolytes and renal function closely.    Gastroesophageal reflux disease   Chronic lymphocytic leukemia   Last Assessment & Plan   10/13/2012 Office Visit Edited 10/14/2012  9:01 AM by Kathlen Brunswick, MD     Anemia is likely significantly contributing to her symptoms. There is no compelling evidence for iron deficiency.  We will schedule an appointment for reassessment by Hematology-Oncology.  Transfusion, better control of  CLL and/or treatment with an ESA will allow [place] to determine how much benefit can be achieved by increasing hemoglobin.    Transient blindness   Last Assessment & Plan   09/30/2011 Office Visit Edited 09/30/2011 11:42 AM by Dyann Kief, PA     Resolved and without recurrence    Paroxysmal atrial fibrillation   Last Assessment & Plan   10/25/2012 Office Visit Written 10/25/2012  3:28 PM by Marinus Maw, MD     She is maintaining NSR about 50% of the time. Her ventricular rate is well controlled and she will continue her current medical therapy with  coumadin.    Pacemaker   Last Assessment & Plan   10/25/2012 Office Visit Written 10/25/2012  3:26 PM by Marinus Maw, MD     Her [employer] DDD PPM is working normally. Will recheck in several months.    Hematuria   Last Assessment & Plan   10/13/2012 Office Visit Written 10/13/2012 12:40 PM by Kathlen Brunswick, MD     Onset in approximately 2012. Patient is seeking reevaluation by Urology at [facility].    Anemia, normocytic normochromic   Fasting hyperglycemia       Imaging: Ct Abdomen Pelvis Wo Contrast  11/15/2012   *RADIOLOGY REPORT*  Clinical Data:  Follow up CLL  CT CHEST, ABDOMEN AND PELVIS WITHOUT CONTRAST  Technique:  Multidetector CT imaging of the chest, abdomen and pelvis was performed following the standard protocol without IV contrast.  Comparison:  CT 04/10/2008 and 03/04/2007  CT CHEST  Findings:  There is a small right pleural effusion.  There is mild, basilar predominant interstitial thickening identified.  Multiple tiny nodules are identified in both lungs.  Some of these are subpleural in location.  The others are intraparenchymal.  Index nodule in the left lower lobe measures 4 mm, image 39/series 3. Index nodule in the left upper lobe measure 4 mm, image 15/series 3.  There is moderate cardiac enlargement.  Calcifications involving the LAD, left circumflex and RCA coronary artery noted.  There are calcifications involving the aortic and mitral valves.  There is a left chest wall pacer device with leads in the right atrial appendage and right ventricle.  Right paratracheal lymph node measures 0.8 cm, image 16/series 2.  Small prevascular lymph nodes are noted.  The largest measures 6 mm, image 19/series 2.  There is no axillary or supraclavicular adenopathy.  Review of the visualized osseous structures is significant for mild multilevel spondylosis.  No aggressive lytic or sclerotic bone lesions identified.  IMPRESSION:  1.  Cardiac enlargement and small  pleural effusion noted.  There is also mild, basilar predominant interlobular septal thickening. Findings are concerning for mild CHF. 2.  Multiple tiny pulmonary nodules are identified in both lungs. Some of these are subpleural in distribution and others are randomly distributed within the lung parenchyma.  Their appearance is nonspecific and may be the sequelae of inflammation or atypical infection.  Less likely these could be due to metastatic disease.  CT ABDOMEN AND PELVIS  Findings:  No focal liver abnormalities.  The patient is status post prior cholecystectomy.  The common bile duct measures 1 cm, image 63/series 2.  This is unchanged from previous exam.  Normal appearance of the pancreas.  The spleen is also normal.  The spleen measures 10 cm in craniocaudal dimension.  Normal appearance of the adrenal glands.  The kidneys are unremarkable.  The urinary bladder appears normal.  Previous hysterectomy.  Calcified atherosclerotic disease affects the abdominal aorta. Multiple sub centimeter lymph nodes are seen within the retrocrural region and upper abdomen.  No upper abdominal adenopathy noted. There is no pelvic or inguinal adenopathy identified.  The stomach appears normal.  The small bowel loops have a normal course and caliber without obstruction.  Proximal colon appears normal.  Multiple distal colonic diverticula are identified.  Moderate free fluid noted within upper abdomen and pelvis.  Review of the visualized osseous structures is significant for diffuse osteopenia and multilevel spondylosis.  IMPRESSION:  1.  Abdominal and pelvic ascites. 2.  No adenopathy identified.  There is no splenomegaly. 3.  Chronic increased caliber of the common bile duct status post cholecystectomy.   Original Report Authenticated By: Signa Kell, M.D.   Ct Chest Wo Contrast  11/15/2012   *RADIOLOGY REPORT*  Clinical Data:  Follow up CLL  CT CHEST, ABDOMEN AND PELVIS WITHOUT CONTRAST  Technique:  Multidetector CT  imaging of the chest, abdomen and pelvis was performed following the standard protocol without IV contrast.  Comparison:  CT 04/10/2008 and 03/04/2007  CT CHEST  Findings:  There is a small right pleural effusion.  There is mild, basilar predominant interstitial thickening identified.  Multiple tiny nodules are identified in both lungs.  Some of these are subpleural in location.  The others are intraparenchymal.  Index nodule in the left lower lobe measures 4 mm, image 39/series 3. Index nodule in the left upper lobe measure 4 mm, image 15/series 3.  There is moderate cardiac enlargement.  Calcifications involving the LAD, left circumflex and RCA coronary artery noted.  There are calcifications involving the aortic and mitral valves.  There is a left chest wall pacer device with leads in the right atrial appendage and right ventricle.  Right paratracheal lymph node measures 0.8 cm, image 16/series 2.  Small prevascular lymph nodes are noted.  The largest measures 6 mm, image 19/series 2.  There is no axillary or supraclavicular adenopathy.  Review of the visualized osseous structures is significant for mild multilevel spondylosis.  No aggressive lytic or sclerotic bone lesions identified.  IMPRESSION:  1.  Cardiac enlargement and small pleural effusion noted.  There is also mild, basilar predominant interlobular septal thickening. Findings are concerning for mild CHF. 2.  Multiple tiny pulmonary nodules are identified in both lungs. Some of these are subpleural in distribution and others are randomly distributed within the lung parenchyma.  Their appearance is nonspecific and may be the sequelae of inflammation or atypical infection.  Less likely these could be due to metastatic disease.  CT ABDOMEN AND PELVIS  Findings:  No focal liver abnormalities.  The patient is status post prior cholecystectomy.  The common bile duct measures 1 cm, image 63/series 2.  This is unchanged from previous exam.  Normal appearance of  the pancreas.  The spleen is also normal.  The spleen measures 10 cm in craniocaudal dimension.  Normal appearance of the adrenal glands.  The kidneys are unremarkable.  The urinary bladder appears normal.  Previous hysterectomy.  Calcified atherosclerotic disease affects the abdominal aorta. Multiple sub centimeter lymph nodes are seen within the retrocrural region and upper abdomen.  No upper abdominal adenopathy noted. There is no pelvic or inguinal adenopathy identified.  The stomach appears normal.  The small bowel loops have a normal course and caliber without obstruction.  Proximal colon appears normal.  Multiple distal colonic diverticula are identified.  Moderate free fluid noted within upper abdomen  and pelvis.  Review of the visualized osseous structures is significant for diffuse osteopenia and multilevel spondylosis.  IMPRESSION:  1.  Abdominal and pelvic ascites. 2.  No adenopathy identified.  There is no splenomegaly. 3.  Chronic increased caliber of the common bile duct status post cholecystectomy.   Original Report Authenticated By: Signa Kell, M.D.

## 2012-11-25 NOTE — Assessment & Plan Note (Signed)
Remains in NSR today. No changes. She will continue with coumadin therapy.

## 2012-11-25 NOTE — Assessment & Plan Note (Signed)
Currently well controlled. No changes in her medication regimen.  

## 2012-11-25 NOTE — Assessment & Plan Note (Signed)
She has not put on any weight and does not appear to be decompensated. She continues to have abdominal ascites per CT scan completed 3 weeks ago. I have asked her to take torsemide 20 mg in am and 10 mg in pm to assist with fluid, but doubt this will help ascites. There is no significant LE edema.

## 2012-11-25 NOTE — Progress Notes (Signed)
[facility] [phone]) [phone]   [phone]  OFFICE PROGRESS NOTE  [name], MD [address] [place] [address]  DIAGNOSIS: CLL  INTERVAL HISTORY:  She was diagnosed with CLL on 11/23/2007 .She has not received any specific antineoplastic therapy for this.  [name] who is a 80 y.o. female returns to the clinic today for scheduled follow up. He complains her once was no fatigue with shortness of breath, and tells me that she's been feeling progressively worse over the last several months. She denies night sweats or fever. She states that time she was told by her cardiologist that she would need blood transfusion. She complains of generalized feeling of well. Previously she was on Procrit and stated that she felt slightly better While she was on Procrit. Her last Procrit injection on record was in December 2012.   [name]  returns to the clinic today for scheduled follow up.  Following her last visit I had felt  significant  hepatomegaly which I suspected  was related to her CLL however CT of the abdomen/pelvis and a chest showed cardiomegaly, hepatomegaly and abdominal/pelvic ascites. There is no splenomegaly reported. Also no significant adenopathy was reported. Noted was multiple nonspecific pulmonary nodules.  Patient is known to have that aortic stenosis. Last echocardiogram in June 2014 showed relatively preserved ejection fraction at 65%. She continues to have severe dyspnea  and reports shortness of breath on tasks as simple as putting on her clothes with increasing fatigue. She states that she is being considered not to be a candidate for surgical therapy in the past.   MEDICAL HISTORY: Past Medical History  Diagnosis Date  . Sick sinus syndrome 11/2006    paroxysmal atrial fibrillation; dual-chamber pacemaker in 11/2006  . Aortic stenosis     mild  . Left bundle branch block   . Congestive heart failure    Congestive heart failure with normal ejection fraction  . Arteriosclerotic cardiovascular disease (ASCVD)     coronary angio in 10/09:50% left anterior descending; 80% PDA; medical therapy advisedOrthostatic decrease in blood pressure  . Epistaxis     mild with negative ENT evaluation  . Dizziness     chronic  . Osteoarthritis   . Irritable bowel syndrome   . Urinary frequency     with incontinence  . Gastroesophageal reflux disease     Esophageal dilatation for stricture in 09/2011  . Rosacea   . Chronic lymphocytic leukemia     Stage I with anemia H./H. of 9/27 in 11/09 with normal MCV; iron deficiency in bone marrow  . Orthostatic hypotension   . Chronic anticoagulation   . Diverticulosis   . 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 Chronic kidney disease      Creatinine of 1.7 in 8/08, 1.4 in 10/08 and 1.5 in 11/09    ALLERGIES:  is allergic to vesicare; detrol; sanctura; sulfa antibiotics; and sulfonamide derivatives.  MEDICATIONS: reviewed  SURGICAL HISTORY:  Past Surgical History  Procedure Laterality Date  . Cholecystectomy    . Cervical spine surgery    . Lumbar disc surgery    . Bladder suspension    . Abdominal hysterectomy  1972  . Cataract extraction      Bilateral  . Esophagogastroduodenoscopy  01/09/09    Rourk -noncritical Schatzki's ring, small hiatal hernia, fundal gland polyps, bile-stained  gastric mucosa, polypoid lesion straddling D1 and D2 1-2 cm, localized lymphangitic-appearing mucosa at D2 biopsied (adenomatous polyp with high grade glandular dysplasia  . A-v cardiac pacemaker insertion  11/2006    [employer]     REVIEW OF SYSTEMS: 14 point review of system is as in the history above otherwise negative.  PHYSICAL EXAMINATION:  Wt Readings from Last 3 Encounters:  11/25/12 153 lb 9.6 oz (69.673 kg)  11/25/12 153 lb 14.4 oz (69.809 kg)  11/11/12 155 lb (70.308 kg)     Temp Readings from Last 3 Encounters:  11/25/12 98.8 F (37.1 C) Oral  11/11/12 97.9 F (36.6 C) Oral  07/20/12 98.1 F (36.7 C) Oral   BP Readings from Last 3 Encounters:  11/25/12 126/56  11/25/12 116/62  11/11/12 130/69   Pulse Readings from Last 3 Encounters:  11/25/12 71  11/25/12 62  11/11/12 86    GENERAL: No acute distress. SKIN:  No rashes or significant lesions . No ecchymosis or petechial rash. HEAD: Normocephalic, No masses, lesions, tenderness or abnormalities  EYES: Conjunctiva are pink and non-injected and no jaundice ENT: External ears normal ,lips, buccal mucosa, and tongue normal and mucous membranes are moist . No evidence of thrush. LYMPH: No palpable lymphadenopathy, in the neck, supraclavicular areas or axilla. LUNGS: Clear to auscultation , no crackles or wheezes HEART: regular rate & rhythm, 4/6 ejection systolic murmurs, no gallops, S1 normal and S2 normal and no S3. ABDOMEN: Abdomen soft, non-tender, is palpable hepatomegaly at least 5 finger breadths below the coastal margin. EXTREMITIES: 1+ leg edema, no skin discoloration or tenderness NEURO: Alert & oriented , no focal motor  deficits.     LABORATORY DATA: Lab Results  Component Value Date   WBC 14.1* 11/15/2012   HGB 9.0* 11/15/2012   HCT 29.4* 11/15/2012   MCV 98.0 11/15/2012   PLT 231 11/15/2012      Chemistry      Component Value Date/Time   NA 138 11/15/2012 1500   K 4.0 11/15/2012 1500   CL 101 11/15/2012 1500   CO2 26 11/15/2012 1500   BUN 66* 11/15/2012 1500   CREATININE 1.92* 11/15/2012 1500   CREATININE 1.73* 10/20/2012 1420      Component Value Date/Time   CALCIUM 9.3 11/15/2012 1500   ALKPHOS 94 11/15/2012 1500   AST 16 11/15/2012 1500   ALT 9 11/15/2012 1500   BILITOT 0.3 11/15/2012 1500     Results for [name], [name] (MRN [id number]) as of 11/25/2012 19:28  Ref. Range 04/05/2012 03:50 04/05/2012 19:19 04/14/2012 03:45 10/20/2012 14:20 11/15/2012 15:00 11/25/2012 12:40  Pro B Natriuretic peptide  (BNP) Latest Range: 0-450 pg/mL 1475.0 (H) 1598.0 (H) 442.2   2980.0 (H)    RADIOGRAPHIC STUDIES: Ct Abdomen Pelvis Wo Contrast 11/15/2012   IMPRESSION:  1.  Abdominal and pelvic ascites. 2.  No adenopathy identified.  There is no splenomegaly. 3.  Chronic increased caliber of the common bile duct status post cholecystectomy.   Original Report Authenticated By: Signa Kell, M.D.   Ct Chest Wo Contrast 11/15/2012   *RADIOLOGY REPORT*  Clinical Data:  Follow up CLL  CT CHEST, ABDOMEN AND PELVIS WITHOUT CONTRAST  Technique:    IMPRESSION:  1.  Cardiac enlargement and small pleural effusion noted.  There is also mild, basilar predominant interlobular septal thickening. Findings are concerning for mild CHF. 2.  Multiple tiny pulmonary nodules are identified in both lungs. Some of these are subpleural in distribution and others are randomly distributed within  the lung parenchyma.  Their appearance is nonspecific and may be the sequelae of inflammation or atypical infection.  Less likely these could be due to metastatic disease.    Imaging scans were personally reviewed.    ASSESSMENT:  1. Aortic  Stenosis: I think that this is the main cause of her symptoms based on the recent CT scan results. Patient lacks of convincing evidence  of significant burden of CLL to  explain her fatigue and shortness of breath.  Hemoglobin is relatively stable and I doubt that Procrit was having a significant benefit a she seems to be at baseline off procrit.  In light of her cardiac issues,she may not necessarily benefit form increasing her Hb above 10 g/dL her unless she is below her baseline. Patient is most likely in congestive heart failure given her symptoms, BNP noted above her baseline, hepatomegaly, pleural effusion and ascites..  2. CLL.  Patient does not have splenomegaly and I think that hepatomegaly is most likely from CHF so also is the mild pleural effusion and ascites.  She lacks significant adenopathy and ALC  and WBC is only 'marginally' elevated.  Anemia most likely may be coming from her chronic kidney disease however there could be significant mechanical hemolytic component from aortic stenosis and possibly to CLL even though I doubt this.    PLAN:  1. I referred that to cardiology for optimization of her cardiac medical treatment. 2. I discontinued the anticipated treatment of her CLL with Chlorambucil Shelly Bombard for now.  At this point, I doubt that this would significantly improve her symptoms which most likely are coming from her aortic stenosis. 3. She'll return to clinic in 4 weeks. 4. I spoke with  cardiology PA Natalia Leatherwood (stated that patient is not compliant with her medical treatments) .We discussed this recommendations also    All questions were satisfactorily answered. Patient knows to call if  any concern arises.  I spent more than 50 % counseling the patient face to face. The total time spent in the appointment was 30 minutes.   Sherral Hammers, MD FACP. Hematology/Oncology.

## 2012-11-29 ENCOUNTER — Ambulatory Visit (HOSPITAL_COMMUNITY): Admission: RE | Admit: 2012-11-29 | Payer: Medicare Other | Source: Ambulatory Visit

## 2012-11-29 ENCOUNTER — Inpatient Hospital Stay (HOSPITAL_COMMUNITY): Admission: RE | Admit: 2012-11-29 | Payer: Medicare Other | Source: Ambulatory Visit

## 2012-12-01 ENCOUNTER — Ambulatory Visit (INDEPENDENT_AMBULATORY_CARE_PROVIDER_SITE_OTHER): Payer: Medicare Other | Admitting: *Deleted

## 2012-12-01 ENCOUNTER — Ambulatory Visit (HOSPITAL_COMMUNITY)
Admission: RE | Admit: 2012-12-01 | Discharge: 2012-12-01 | Disposition: A | Discharge status: Home / Self Care | Payer: Medicare Other | Source: Ambulatory Visit | Attending: Hematology and Oncology | Admitting: Hematology and Oncology

## 2012-12-01 DIAGNOSIS — I359 Nonrheumatic aortic valve disorder, unspecified: Secondary | ICD-10-CM | POA: Insufficient documentation

## 2012-12-01 DIAGNOSIS — I350 Nonrheumatic aortic (valve) stenosis: Secondary | ICD-10-CM

## 2012-12-01 DIAGNOSIS — I4891 Unspecified atrial fibrillation: Secondary | ICD-10-CM | POA: Insufficient documentation

## 2012-12-01 DIAGNOSIS — I10 Essential (primary) hypertension: Secondary | ICD-10-CM | POA: Insufficient documentation

## 2012-12-01 DIAGNOSIS — C9110 Chronic lymphocytic leukemia of B-cell type not having achieved remission: Secondary | ICD-10-CM

## 2012-12-01 DIAGNOSIS — I509 Heart failure, unspecified: Secondary | ICD-10-CM | POA: Insufficient documentation

## 2012-12-01 DIAGNOSIS — Z7901 Long term (current) use of anticoagulants: Secondary | ICD-10-CM

## 2012-12-01 NOTE — Progress Notes (Signed)
*  PRELIMINARY RESULTS* Echocardiogram 2D Echocardiogram has been performed.  [name] 12/01/2012, 4:24 PM

## 2012-12-07 ENCOUNTER — Other Ambulatory Visit: Payer: Self-pay | Admitting: *Deleted

## 2012-12-07 ENCOUNTER — Encounter: Payer: Self-pay | Admitting: *Deleted

## 2012-12-07 DIAGNOSIS — Z950 Presence of cardiac pacemaker: Secondary | ICD-10-CM

## 2012-12-07 DIAGNOSIS — I5040 Unspecified combined systolic (congestive) and diastolic (congestive) heart failure: Secondary | ICD-10-CM

## 2012-12-07 DIAGNOSIS — R262 Difficulty in walking, not elsewhere classified: Secondary | ICD-10-CM

## 2012-12-07 DIAGNOSIS — E785 Hyperlipidemia, unspecified: Secondary | ICD-10-CM

## 2012-12-07 DIAGNOSIS — C9110 Chronic lymphocytic leukemia of B-cell type not having achieved remission: Secondary | ICD-10-CM

## 2012-12-07 DIAGNOSIS — N182 Chronic kidney disease, stage 2 (mild): Secondary | ICD-10-CM

## 2012-12-07 DIAGNOSIS — I350 Nonrheumatic aortic (valve) stenosis: Secondary | ICD-10-CM

## 2012-12-07 DIAGNOSIS — R0602 Shortness of breath: Secondary | ICD-10-CM

## 2012-12-07 DIAGNOSIS — I2510 Atherosclerotic heart disease of native coronary artery without angina pectoris: Secondary | ICD-10-CM

## 2012-12-07 DIAGNOSIS — Z7901 Long term (current) use of anticoagulants: Secondary | ICD-10-CM

## 2012-12-07 DIAGNOSIS — I5033 Acute on chronic diastolic (congestive) heart failure: Secondary | ICD-10-CM

## 2012-12-07 DIAGNOSIS — I10 Essential (primary) hypertension: Secondary | ICD-10-CM

## 2012-12-12 LAB — CBC
HCT: 25.9 % — ABNORMAL LOW (ref 36.0–46.0)
MCV: 96.6 fL (ref 78.0–100.0)
RBC: 2.68 MIL/uL — ABNORMAL LOW (ref 3.87–5.11)
WBC: 11.7 10*3/uL — ABNORMAL HIGH (ref 4.0–10.5)

## 2012-12-13 ENCOUNTER — Other Ambulatory Visit (HOSPITAL_COMMUNITY): Payer: Self-pay | Admitting: Oncology

## 2012-12-13 DIAGNOSIS — C9110 Chronic lymphocytic leukemia of B-cell type not having achieved remission: Secondary | ICD-10-CM

## 2012-12-13 DIAGNOSIS — D649 Anemia, unspecified: Secondary | ICD-10-CM

## 2012-12-13 LAB — BASIC METABOLIC PANEL
Potassium: 4.9 mEq/L (ref 3.5–5.3)
Sodium: 144 mEq/L (ref 135–145)

## 2012-12-14 ENCOUNTER — Ambulatory Visit (INDEPENDENT_AMBULATORY_CARE_PROVIDER_SITE_OTHER): Payer: Medicare Other | Admitting: Cardiovascular Disease

## 2012-12-14 ENCOUNTER — Ambulatory Visit (INDEPENDENT_AMBULATORY_CARE_PROVIDER_SITE_OTHER): Payer: Medicare Other | Admitting: *Deleted

## 2012-12-14 VITALS — BP 127/59 | HR 86 | Ht 60.0 in | Wt 152.8 lb

## 2012-12-14 DIAGNOSIS — I2510 Atherosclerotic heart disease of native coronary artery without angina pectoris: Secondary | ICD-10-CM

## 2012-12-14 DIAGNOSIS — I509 Heart failure, unspecified: Secondary | ICD-10-CM

## 2012-12-14 DIAGNOSIS — I359 Nonrheumatic aortic valve disorder, unspecified: Secondary | ICD-10-CM

## 2012-12-14 DIAGNOSIS — E785 Hyperlipidemia, unspecified: Secondary | ICD-10-CM

## 2012-12-14 DIAGNOSIS — R0602 Shortness of breath: Secondary | ICD-10-CM

## 2012-12-14 DIAGNOSIS — I5033 Acute on chronic diastolic (congestive) heart failure: Secondary | ICD-10-CM

## 2012-12-14 DIAGNOSIS — I4891 Unspecified atrial fibrillation: Secondary | ICD-10-CM

## 2012-12-14 DIAGNOSIS — I7090 Unspecified atherosclerosis: Secondary | ICD-10-CM

## 2012-12-14 DIAGNOSIS — I5032 Chronic diastolic (congestive) heart failure: Secondary | ICD-10-CM

## 2012-12-14 DIAGNOSIS — Z7901 Long term (current) use of anticoagulants: Secondary | ICD-10-CM

## 2012-12-14 DIAGNOSIS — I10 Essential (primary) hypertension: Secondary | ICD-10-CM

## 2012-12-14 DIAGNOSIS — I350 Nonrheumatic aortic (valve) stenosis: Secondary | ICD-10-CM

## 2012-12-14 DIAGNOSIS — C9110 Chronic lymphocytic leukemia of B-cell type not having achieved remission: Secondary | ICD-10-CM

## 2012-12-14 DIAGNOSIS — I480 Paroxysmal atrial fibrillation: Secondary | ICD-10-CM

## 2012-12-14 LAB — POCT INR: INR: 2.5

## 2012-12-14 MED ORDER — NITROGLYCERIN 0.4 MG SL SUBL: 0.4000 mg | SUBLINGUAL_TABLET | SUBLINGUAL | Status: AC | PRN

## 2012-12-14 MED ORDER — METOLAZONE 2.5 MG PO TABS: 2.5000 mg | ORAL_TABLET | ORAL | Status: DC | PRN

## 2012-12-14 MED ORDER — NITROGLYCERIN 0.4 MG SL SUBL: 0.4000 mg | SUBLINGUAL_TABLET | SUBLINGUAL | Status: DC | PRN

## 2012-12-14 NOTE — Patient Instructions (Addendum)
Your physician recommends that you schedule a follow-up appointment in: 3 months with Dr Purvis Sheffield  Your physician has recommended you make the following change in your medication:  1.start Metolazone 2.5 as needed for leg swelling  Your physician recommends you start wearing compression hose.

## 2012-12-14 NOTE — Progress Notes (Signed)
Patient ID: [name], female   DOB: 1915-11-20, 79 y.o.   MRN: [id number]   SUBJECTIVE: [name] is an anxious 79 y/o former patient of Dr. Dietrich Pates, we are seeing for ongoing assessment and treatment of chronic dyspnea and AoV stenosis, not found to be a candidate for surgery or TAVR at this time. She also has CLL and her oncologist has recommended Hospice, which has greatly upset the patient and has caused a great deal of anxiety.   Recent CT scan 11/15/2012 demonstrated, abdominal and pelvic ascites. No adenopathy identified. There is no splenomegaly. Chronic increased caliber of the common bile duct status post cholecystectomy. She has been seen in the past by Dr. Excell Seltzer who completed cardiac cath on 10-06-12, which revealed moderate aortic stenosis and elevated filling pressures. Thus, she is not a current candidate for TAVR.   She had previously not been taking torsemide as directed, and was encouraged by Joni Reining (NP) to take it as recommended to help with her ascites and dyspnea. However, the patient c/o abdominal pain with 40 mg.  She has dyspnea both with and without exertion. She also has orthopnea and PND. She only uses 1 pillow to sleep. She was receiving Procrit injections but says they've no longer recommended this, which frustrates her. She occasionally has chest pain when lying down at night and her breathing becomes too difficult.  She denies syncope.  She showed me a newspaper article about an experimental drug for heart failure, not yet approved in the [place]  She is here with her son. After I left the clinic room, her son came out and told me that her mother is very stubborn, and doesn't appreciate the gravity of all her comorbidities.     Echo results: - Left ventricle: The cavity size was normal. There was moderate concentric hypertrophy. Systolic function was normal. The estimated ejection fraction was in the range of 60% to 65%. Wall motion was normal; there  were no regional wall motion abnormalities. Features are consistent with a pseudonormal left ventricular filling pattern, with concomitant abnormal relaxation and increased filling pressure (grade 2 diastolic dysfunction). Doppler parameters are consistent with high ventricular filling pressure. - Ventricular septum: The contour showed diastolic flattening and systolic flattening consistent with increased RV pressure and volume. - Aortic valve: Moderately calcified annulus. Trileaflet; moderately calcified leaflets. There was moderate to severe stenosis. Mild regurgitation. Mean gradient: 23mm Hg (S). Peak gradient: 48mm Hg (S). Valve area: 1.03cm^2(VTI). LVOT/AV VTI ration 0.34. - Mitral valve: Severely calcified annulus, particularly posteriorly. Moderate regurgitation. Mean gradient: 4mm Hg (D). - Left atrium: The atrium was severely dilated. - Right ventricle: The cavity size was mildly to moderately dilated. Pacer wire or catheter noted in right ventricle. Systolic function was normal. - Right atrium: The atrium was severely dilated. Pacer wire or catheter noted in right atrium. Central venous pressure: 8mm Hg (est). - Tricuspid valve: The valve appears to be grossly normal although coaptation likely incomplete due to device wire. Moderate-severe regurgitation. Vena contracta less than 7 mm. - Pulmonary arteries: Systolic pressure was moderately to severely increased. PA peak pressure: 69mm Hg (S). - Pericardium, extracardiac: There was no pericardial effusion.  Impressions: - Comparison to recent study June 2014. See above for details. Suspect no major change in degree of aortic stenosis or mitral regurgitation allowing for differences in technique. Pulmonary pressures have increased.     Allergies  Allergen Reactions  . Vesicare [Solifenacin] Other (See Comments)    Severe stomach pain  .  Detrol [Tolterodine Tartrate] Other (See Comments)    Patient states: "I  was told to never take it again, I do not recall the reaction"  . Sanctura [Trospium Chloride] Nausea Only and Other (See Comments)    Intense lower abd pain  . Sulfa Antibiotics Other (See Comments)    Extreme dizziness  . Sulfonamide Derivatives Other (See Comments)    REACTION: GI distress  . Trimethoprim     Other reaction(s): Dermatitis    Current Outpatient Prescriptions  Medication Sig Dispense Refill  . acetaminophen (TYLENOL) 500 MG tablet Take 1,000 mg by mouth 4 (four) times daily.        [name] albuterol (PROVENTIL HFA;VENTOLIN HFA) 108 (90 BASE) MCG/ACT inhaler Inhale 2 puffs into the lungs every 6 (six) hours as needed for wheezing.      . calcium-vitamin D (OSCAL WITH D) 500-200 MG-UNIT per tablet Take 1 tablet by mouth every morning.       [name] estradiol (ESTRACE) 0.1 MG/GM vaginal cream Place 2 g vaginally daily.      [name] losartan (COZAAR) 100 MG tablet Take 100 mg by mouth daily.      . metoprolol tartrate (LOPRESSOR) 25 MG tablet TAKE 1 TABLET (25 MG TOTAL) BY MOUTH 2 (TWO) TIMES DAILY.  180 tablet  2  . Multiple Vitamins-Minerals (MULTIVITAMIN WITH MINERALS) tablet Take 1 tablet by mouth every morning.       . nitroGLYCERIN (NITROSTAT) 0.4 MG SL tablet Place 1 tablet (0.4 mg total) under the tongue every 5 (five) minutes as needed for chest pain.  25 tablet  11  . pravastatin (PRAVACHOL) 40 MG tablet Take 40 mg by mouth at bedtime.       . torsemide (DEMADEX) 20 MG tablet Take 20 mg by mouth daily.      . verapamil (CALAN-SR) 180 MG CR tablet TAKE 1 TABLET (180 MG TOTAL)  BY MOUTH AT BEDTIME.  90 tablet  2  . VESICARE 5 MG tablet       . warfarin (COUMADIN) 2.5 MG tablet Take 1.25-2.5 mg by mouth every evening. 2.5 mg daily except she takes 1/2 tablet on thursdays      . warfarin (COUMADIN) 2.5 MG tablet TAKE 1 TABLET (2.5 MG TOTAL)  BY MOUTH AS DIRECTED.  90 tablet  2   No current facility-administered medications for this visit.   Facility-Administered Medications Ordered in  Other Visits  Medication Dose Route Frequency Provider Last Rate Last Dose  . epoetin alfa (EPOGEN,PROCRIT) injection 40,000 Units  40,000 Units Subcutaneous Once Randall An, MD        Past Medical History  Diagnosis Date  . Sick sinus syndrome 11/2006    paroxysmal atrial fibrillation; dual-chamber pacemaker in 11/2006  . Aortic stenosis     mild  . Left bundle branch block   . Congestive heart failure     Congestive heart failure with normal ejection fraction  . Arteriosclerotic cardiovascular disease (ASCVD)     coronary angio in 10/09:50% left anterior descending; 80% PDA; medical therapy advisedOrthostatic decrease in blood pressure  . Epistaxis     mild with negative ENT evaluation  . Dizziness     chronic  . Osteoarthritis   . Irritable bowel syndrome   . Urinary frequency     with incontinence  . Gastroesophageal reflux disease     Esophageal dilatation for stricture in 09/2011  . Rosacea   . Chronic lymphocytic leukemia     Stage I with anemia  H./H. of 9/27 in 11/09 with normal MCV; iron deficiency in bone marrow  . Orthostatic hypotension   . Chronic anticoagulation   . Diverticulosis   . 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 Chronic kidney disease      Creatinine of 1.7 in 8/08, 1.4 in 10/08 and 1.5 in 11/09  . Urethral caruncle     Past Surgical History  Procedure Laterality Date  . Cholecystectomy    . Cervical spine surgery    . Lumbar disc surgery    . Bladder suspension    . Abdominal hysterectomy  1972  . Cataract extraction      Bilateral  . Esophagogastroduodenoscopy  01/09/09    Rourk -noncritical Schatzki's ring, small hiatal hernia, fundal gland polyps, bile-stained gastric mucosa, polypoid lesion straddling D1 and D2 1-2 cm, localized lymphangitic-appearing mucosa at D2 biopsied (adenomatous polyp with high grade glandular dysplasia  . A-v cardiac pacemaker  insertion  11/2006    [employer]    History   Social History  . Marital Status: Widowed    Spouse Name: N/A    Number of Children: N/A  . Years of Education: N/A   Occupational History  . retired    Social History Main Topics  . Smoking status: Never Smoker   . Smokeless tobacco: Never Used  . Alcohol Use: No  . Drug Use: No  . Sexual Activity: No   Other Topics Concern  . Not on file   Social History Narrative  . No narrative on file      BP: 127/59  Pulse: 86  PHYSICAL EXAM General: NAD Neck: no thyromegaly or thyroid nodule.  Lungs: Clear to auscultation bilaterally with normal respiratory effort. CV: Nondisplaced PMI.  Heart regular S1/S2 heard but slightly diminished, no S3/S4, III/VI crescendo-decrescendo, late-peaking systolic murmur. 1+ pitting pedal edema.   Normal pedal pulses.  Abdomen: Soft, nontender, no hepatosplenomegaly, no distention.  Neurologic: Alert and oriented x 3.  Psych: Normal affect. Extremities: No clubbing or cyanosis. Venous stasis changes noted on dorsum of feet.  ECG: reviewed and available in electronic records.      ASSESSMENT AND PLAN: 1. Moderate to severe aortic stenosis: will continue to manage medically, as she is not currently a candidate for surgery or TAVR given that her stenosis is moderate (S2 appreciated) with a mean gradient of 23 mmHg.  2. Chronic diastolic heart failure: recommended torsemide 20 mg daily and to take 2.5 of metolazone for increased leg swelling as she is not tolerating 40 mg of torsemide. I also gave her a prescription for compression stockings 15-20 mmHg.  3. Paroxysmal atrial fibrillation: currently in normal sinus rhythm. Maintains anticoagulation with warfarin.  4. HTN: controlled.  5. Dyspnea: will help to manage with diuretics, as both aortic stenosis and diastolic heart failure are contributing. She may benefit from iron therapy, but I will defer to her oncologist.  Dispo: I spoke with  her at length regarding hospice and palliative treatments, but she does not appear to fully comprehend the full spectrum of her comorbidities, nor does she accept them. I spoke with her son at length regarding these issues as well, and he is in agreement with me. Will f/u in 3 months.  Time spent: 50 minutes.   Prentice Docker, M.D., F.A.C.C.    End of life issues, talk to oncol about iron, torsemide 20 daily then metolazone 2.5 mg prn leg edema,  compression stockings 15-20 mmHg  S2 heard.

## 2012-12-16 ENCOUNTER — Encounter (HOSPITAL_COMMUNITY): Payer: Medicare Other | Attending: Hematology and Oncology

## 2012-12-16 DIAGNOSIS — D509 Iron deficiency anemia, unspecified: Secondary | ICD-10-CM | POA: Insufficient documentation

## 2012-12-16 DIAGNOSIS — C9110 Chronic lymphocytic leukemia of B-cell type not having achieved remission: Secondary | ICD-10-CM

## 2012-12-16 DIAGNOSIS — D649 Anemia, unspecified: Secondary | ICD-10-CM

## 2012-12-16 LAB — IRON AND TIBC
Iron: 30 ug/dL — ABNORMAL LOW (ref 42–135)
Saturation Ratios: 8 % — ABNORMAL LOW (ref 20–55)
TIBC: 387 ug/dL (ref 250–470)
UIBC: 357 ug/dL (ref 125–400)

## 2012-12-16 LAB — CBC WITH DIFFERENTIAL/PLATELET
Basophils Absolute: 0.1 10*3/uL (ref 0.0–0.1)
Eosinophils Relative: 4 % (ref 0–5)
Lymphocytes Relative: 40 % (ref 12–46)
MCHC: 31.1 g/dL (ref 30.0–36.0)
MCV: 96 fL (ref 78.0–100.0)
Neutro Abs: 9.5 10*3/uL — ABNORMAL HIGH (ref 1.7–7.7)
Platelets: 273 10*3/uL (ref 150–400)
RBC: 2.98 MIL/uL — ABNORMAL LOW (ref 3.87–5.11)
RDW: 14.2 % (ref 11.5–15.5)
WBC: 18.7 10*3/uL — ABNORMAL HIGH (ref 4.0–10.5)

## 2012-12-16 LAB — FERRITIN: Ferritin: 60 ng/mL (ref 10–291)

## 2012-12-16 LAB — VITAMIN B12: Vitamin B-12: 1191 pg/mL — ABNORMAL HIGH (ref 211–911)

## 2012-12-16 LAB — FOLATE: Folate: >20 ng/mL

## 2012-12-16 NOTE — Progress Notes (Signed)
Labs drawn today for vb12,folate,Iron and IBC,,fer,cbc/diff

## 2012-12-19 ENCOUNTER — Other Ambulatory Visit (HOSPITAL_COMMUNITY): Payer: Self-pay | Admitting: Oncology

## 2012-12-19 DIAGNOSIS — E611 Iron deficiency: Secondary | ICD-10-CM

## 2012-12-23 ENCOUNTER — Encounter (HOSPITAL_BASED_OUTPATIENT_CLINIC_OR_DEPARTMENT_OTHER): Payer: Medicare Other

## 2012-12-23 ENCOUNTER — Encounter (HOSPITAL_COMMUNITY): Payer: Self-pay

## 2012-12-23 VITALS — BP 80/51 | HR 63 | Temp 96.7°F | Resp 18 | Wt 148.2 lb

## 2012-12-23 DIAGNOSIS — D649 Anemia, unspecified: Secondary | ICD-10-CM

## 2012-12-23 DIAGNOSIS — D500 Iron deficiency anemia secondary to blood loss (chronic): Secondary | ICD-10-CM

## 2012-12-23 DIAGNOSIS — Z7901 Long term (current) use of anticoagulants: Secondary | ICD-10-CM

## 2012-12-23 DIAGNOSIS — C9110 Chronic lymphocytic leukemia of B-cell type not having achieved remission: Secondary | ICD-10-CM

## 2012-12-23 DIAGNOSIS — N189 Chronic kidney disease, unspecified: Secondary | ICD-10-CM

## 2012-12-23 DIAGNOSIS — E611 Iron deficiency: Secondary | ICD-10-CM

## 2012-12-23 DIAGNOSIS — I4891 Unspecified atrial fibrillation: Secondary | ICD-10-CM

## 2012-12-23 DIAGNOSIS — D631 Anemia in chronic kidney disease: Secondary | ICD-10-CM

## 2012-12-23 LAB — CBC WITH DIFFERENTIAL/PLATELET
Eosinophils Absolute: 0.5 10*3/uL (ref 0.0–0.7)
Eosinophils Relative: 4 % (ref 0–5)
Lymphs Abs: 5.8 10*3/uL — ABNORMAL HIGH (ref 0.7–4.0)
MCH: 29.9 pg (ref 26.0–34.0)
MCV: 95.9 fL (ref 78.0–100.0)
Monocytes Relative: 4 % (ref 3–12)
Neutro Abs: 6.7 10*3/uL (ref 1.7–7.7)
Platelets: 224 10*3/uL (ref 150–400)
RBC: 2.94 MIL/uL — ABNORMAL LOW (ref 3.87–5.11)

## 2012-12-23 MED ORDER — FERUMOXYTOL INJECTION 510 MG/17 ML
510.0000 mg | Freq: Once | INTRAVENOUS | Status: AC
  Administered 2012-12-23: 510 mg via INTRAVENOUS
  Filled 2012-12-23: qty 17

## 2012-12-23 MED ORDER — SODIUM CHLORIDE 0.9 % IJ SOLN
10.0000 mL | INTRAMUSCULAR | Status: DC | PRN
  Administered 2012-12-23: 10 mL via INTRAVENOUS

## 2012-12-23 MED ORDER — SODIUM CHLORIDE 0.9 % IV SOLN
INTRAVENOUS | Status: DC
  Administered 2012-12-23: 11:00:00 via INTRAVENOUS

## 2012-12-23 NOTE — Progress Notes (Signed)
[facility] OFFICE PROGRESS NOTE  HAWKINS,EDWARD Elbert Ewings, MD [address] [place] [address]  DIAGNOSIS: Chronic lymphocytic leukemia  Anemia, normocytic normochromic  Chronic kidney disease  Chief Complaint  Patient presents with  . Leukemia    CLL untreated    CURRENT THERAPY: Intravenous iron and erythrocyte stimulating agent therapy when needed. Chronic bleeding from a urethral carbuncle. Recently short of breath with adjustments in diuretic therapy producing improvement in cardiac function. Remaining on warfarin.  INTERVAL HISTORY: [name] 79 y.o. female returns for followup of chronic lymphocytic leukemia and normocytic normochromic anemia with iron deficiency, due for intravenous perineum today. Mildly improved breathing since her visit here last week when she had blood work done. She denies any fever, night sweats, but does have urinary frequency with dripping from the vaginal area due to a urethral carbuncle. She denies any melena, hematochezia, epistaxis, or hemoptysis. Appetite is good with no nausea, vomiting, diarrhea, constipation, palpitations, PND, orthopnea. She also denies any lower extremity swelling but does suffer with superficial varicosities.    MEDICAL HISTORY: Past Medical History  Diagnosis Date  . Sick sinus syndrome 11/2006    paroxysmal atrial fibrillation; dual-chamber pacemaker in 11/2006  . Aortic stenosis     mild  . Left bundle branch block   . Congestive heart failure     Congestive heart failure with normal ejection fraction  . Arteriosclerotic cardiovascular disease (ASCVD)     coronary angio in 10/09:50% left anterior descending; 80% PDA; medical therapy advisedOrthostatic decrease in blood pressure  . Epistaxis     mild with negative ENT evaluation  . Dizziness     chronic  . Osteoarthritis   . Irritable bowel syndrome   . Urinary frequency     with incontinence  . Gastroesophageal reflux disease      Esophageal dilatation for stricture in 09/2011  . Rosacea   . Chronic lymphocytic leukemia     Stage I with anemia H./H. of 9/27 in 11/09 with normal MCV; iron deficiency in bone marrow  . Orthostatic hypotension   . Chronic anticoagulation   . Diverticulosis   . 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 Chronic kidney disease      Creatinine of 1.7 in 8/08, 1.4 in 10/08 and 1.5 in 11/09  . Urethral caruncle     INTERIM HISTORY: has HYPERLIPIDEMIA; HYPERTENSION; Arteriosclerotic cardiovascular disease (ASCVD); Chronic anticoagulation; Sick sinus syndrome; Aortic stenosis; Congestive heart failure; Chronic kidney disease; Gastroesophageal reflux disease; Chronic lymphocytic leukemia; Transient blindness; Paroxysmal atrial fibrillation; Pacemaker; Hematuria; Anemia, normocytic normochromic; and Fasting hyperglycemia on her problem list.    ALLERGIES:  is allergic to vesicare; detrol; sanctura; sulfa antibiotics; sulfonamide derivatives; and trimethoprim.  MEDICATIONS: has a current medication list which includes the following prescription(s): acetaminophen, albuterol, calcium-vitamin d, estradiol, losartan, metoprolol tartrate, multi-vitamins, multivitamin with minerals, nitroglycerin, pravastatin, torsemide, verapamil, warfarin, metolazone, omeprazole, and solifenacin, and the following Facility-Administered Medications: sodium chloride, epoetin alfa, ferumoxytol, and sodium chloride.  SURGICAL HISTORY:  Past Surgical History  Procedure Laterality Date  . Cholecystectomy    . Cervical spine surgery    . Lumbar disc surgery    . Bladder suspension    . Abdominal hysterectomy  1972  . Cataract extraction      Bilateral  . Esophagogastroduodenoscopy  01/09/09    Rourk -noncritical Schatzki's ring, small hiatal hernia, fundal gland polyps, bile-stained gastric  mucosa, polypoid lesion straddling D1 and D2 1-2  cm, localized lymphangitic-appearing mucosa at D2 biopsied (adenomatous polyp with high grade glandular dysplasia  . A-v cardiac pacemaker insertion  11/2006    [employer]    FAMILY HISTORY: family history includes Cancer in her father; Diabetes in her mother; Heart failure in her father.  SOCIAL HISTORY:  reports that she has never smoked. She has never used smokeless tobacco. She reports that she does not drink alcohol or use illicit drugs.  REVIEW OF SYSTEMS:  Other than that discussed above is noncontributory.  PHYSICAL EXAMINATION: ECOG PERFORMANCE STATUS: 1 - Symptomatic but completely ambulatory  Blood pressure 80/51, pulse 63, temperature 96.7 F (35.9 C), temperature source Oral, resp. rate 18, weight 148 lb 3.2 oz (67.223 kg), SpO2 96.00%.  GENERAL:alert, no distress and comfortable SKIN: skin color, texture, turgor are normal, no rashes or significant lesions EYES: PERLA; Conjunctiva are pink and non-injected, sclera clear OROPHARYNX:no exudate, no erythema on lips, buccal mucosa, or tongue. NECK: supple, thyroid normal size, non-tender, without nodularity. No masses CHEST: Normal AP diameter with pacemaker in place. LYMPH:  no palpable lymphadenopathy in the cervical, axillary or inguinal LUNGS: clear to auscultation and percussion with normal breathing effort HEART: Irregularly irregular with no S3. ABDOMEN:abdomen soft, non-tender and normal bowel sounds MUSCULOSKELETAL:no cyanosis of digits and no clubbing. Range of motion normal. Bilateral superficial varicosities in the lower extremities. NEURO: alert & oriented x 3 with fluent speech, no focal motor/sensory deficits   LABORATORY DATA: Infusion on 12/23/2012  Component Date Value Range Status  . WBC 12/23/2012 13.6* 4.0 - 10.5 K/uL Final  . RBC 12/23/2012 2.94* 3.87 - 5.11 MIL/uL Final  . Hemoglobin 12/23/2012 8.8* 12.0 - 15.0 g/dL Final  . HCT 40/98/1191 28.2* 36.0 - 46.0 % Final  . MCV 12/23/2012 95.9  78.0 -  100.0 fL Final  . MCH 12/23/2012 29.9  26.0 - 34.0 pg Final  . MCHC 12/23/2012 31.2  30.0 - 36.0 g/dL Final  . RDW 47/82/9562 14.2  11.5 - 15.5 % Final  . Platelets 12/23/2012 224  150 - 400 K/uL Final  . Neutrophils Relative % 12/23/2012 49  43 - 77 % Final  . Neutro Abs 12/23/2012 6.7  1.7 - 7.7 K/uL Final  . Lymphocytes Relative 12/23/2012 42  12 - 46 % Final  . Lymphs Abs 12/23/2012 5.8* 0.7 - 4.0 K/uL Final  . Monocytes Relative 12/23/2012 4  3 - 12 % Final  . Monocytes Absolute 12/23/2012 0.6  0.1 - 1.0 K/uL Final  . Eosinophils Relative 12/23/2012 4  0 - 5 % Final  . Eosinophils Absolute 12/23/2012 0.5  0.0 - 0.7 K/uL Final  . Basophils Relative 12/23/2012 0  0 - 1 % Final  . Basophils Absolute 12/23/2012 0.1  0.0 - 0.1 K/uL Final  Infusion on 12/16/2012  Component Date Value Range Status  . Vitamin B-12 12/16/2012 1191* 211 - 911 pg/mL Final   Performed at [facility]  . Folate 12/16/2012 >20.0   Final   Comment: (NOTE)                          Reference Ranges                                 Deficient:       0.4 - 3.3 ng/mL  Indeterminate:   3.4 - 5.4 ng/mL                                 Normal:              > 5.4 ng/mL                          Performed at [facility]  . Iron 12/16/2012 30* 42 - 135 ug/dL Final  . TIBC 45/40/9811 387  250 - 470 ug/dL Final  . Saturation Ratios 12/16/2012 8* 20 - 55 % Final  . UIBC 12/16/2012 357  125 - 400 ug/dL Final   Performed at [facility]  . Ferritin 12/16/2012 60  10 - 291 ng/mL Final   Performed at [facility]  . WBC 12/16/2012 18.7* 4.0 - 10.5 K/uL Final  . RBC 12/16/2012 2.98* 3.87 - 5.11 MIL/uL Final  . Hemoglobin 12/16/2012 8.9* 12.0 - 15.0 g/dL Final  . HCT 91/47/8295 28.6* 36.0 - 46.0 % Final  . MCV 12/16/2012 96.0  78.0 - 100.0 fL Final  . MCH 12/16/2012 29.9  26.0 - 34.0 pg Final  . MCHC 12/16/2012 31.1  30.0 - 36.0 g/dL Final  . RDW 62/13/0865 14.2   11.5 - 15.5 % Final  . Platelets 12/16/2012 273  150 - 400 K/uL Final  . Neutrophils Relative % 12/16/2012 51  43 - 77 % Final  . Neutro Abs 12/16/2012 9.5* 1.7 - 7.7 K/uL Final  . Lymphocytes Relative 12/16/2012 40  12 - 46 % Final  . Lymphs Abs 12/16/2012 7.4* 0.7 - 4.0 K/uL Final  . Monocytes Relative 12/16/2012 5  3 - 12 % Final  . Monocytes Absolute 12/16/2012 1.0  0.1 - 1.0 K/uL Final  . Eosinophils Relative 12/16/2012 4  0 - 5 % Final  . Eosinophils Absolute 12/16/2012 0.7  0.0 - 0.7 K/uL Final  . Basophils Relative 12/16/2012 1  0 - 1 % Final  . Basophils Absolute 12/16/2012 0.1  0.0 - 0.1 K/uL Final  Anti-coag visit on 12/14/2012  Component Date Value Range Status  . INR 12/14/2012 2.5   Final  Orders Only on 12/07/2012  Component Date Value Range Status  . Sodium 12/12/2012 144  135 - 145 mEq/L Final  . Potassium 12/12/2012 4.9  3.5 - 5.3 mEq/L Final  . Chloride 12/12/2012 109  96 - 112 mEq/L Final  . CO2 12/12/2012 26  19 - 32 mEq/L Final  . Glucose, Bld 12/12/2012 129* 70 - 99 mg/dL Final  . BUN 78/46/9629 59* 6 - 23 mg/dL Final  . Creat 52/84/1324 1.72* 0.50 - 1.10 mg/dL Final  . Calcium 40/12/2723 9.2  8.4 - 10.5 mg/dL Final  . WBC 36/64/4034 11.7* 4.0 - 10.5 K/uL Final  . RBC 12/12/2012 2.68* 3.87 - 5.11 MIL/uL Final  . Hemoglobin 12/12/2012 8.0* 12.0 - 15.0 g/dL Final  . HCT 74/25/9563 25.9* 36.0 - 46.0 % Final  . MCV 12/12/2012 96.6  78.0 - 100.0 fL Final  . MCH 12/12/2012 29.9  26.0 - 34.0 pg Final  . MCHC 12/12/2012 30.9  30.0 - 36.0 g/dL Final  . RDW 87/56/4332 14.2  11.5 - 15.5 % Final  . Platelets 12/12/2012 205  150 - 400 K/uL Final  Anti-coag visit on 12/01/2012  Component Date Value Range Status  . INR 12/01/2012 2.8   Final  Office Visit on 11/25/2012  Component Date Value Range Status  . WBC 11/25/2012 13.9* 4.0 - 10.5 K/uL Final  . RBC 11/25/2012 2.99* 3.87 - 5.11 MIL/uL Final  . Hemoglobin 11/25/2012 9.0* 12.0 - 15.0 g/dL Final  . HCT 16/12/9602  29.0* 36.0 - 46.0 % Final  . MCV 11/25/2012 97.0  78.0 - 100.0 fL Final  . MCH 11/25/2012 30.1  26.0 - 34.0 pg Final  . MCHC 11/25/2012 31.0  30.0 - 36.0 g/dL Final  . RDW 54/11/8117 14.0  11.5 - 15.5 % Final  . Platelets 11/25/2012 232  150 - 400 K/uL Final  . Neutrophils Relative % 11/25/2012 54  43 - 77 % Final  . Neutro Abs 11/25/2012 7.5  1.7 - 7.7 K/uL Final  . Lymphocytes Relative 11/25/2012 38  12 - 46 % Final  . Lymphs Abs 11/25/2012 5.3* 0.7 - 4.0 K/uL Final  . Monocytes Relative 11/25/2012 5  3 - 12 % Final  . Monocytes Absolute 11/25/2012 0.6  0.1 - 1.0 K/uL Final  . Eosinophils Relative 11/25/2012 3  0 - 5 % Final  . Eosinophils Absolute 11/25/2012 0.4  0.0 - 0.7 K/uL Final  . Basophils Relative 11/25/2012 1  0 - 1 % Final  . Basophils Absolute 11/25/2012 0.1  0.0 - 0.1 K/uL Final  . Sodium 11/25/2012 138  135 - 145 mEq/L Final  . Potassium 11/25/2012 4.1  3.5 - 5.1 mEq/L Final  . Chloride 11/25/2012 105  96 - 112 mEq/L Final  . CO2 11/25/2012 23  19 - 32 mEq/L Final  . Glucose, Bld 11/25/2012 126* 70 - 99 mg/dL Final  . BUN 14/78/2956 65* 6 - 23 mg/dL Final  . Creatinine, Ser 11/25/2012 1.79* 0.50 - 1.10 mg/dL Final  . Calcium 21/30/8657 9.5  8.4 - 10.5 mg/dL Final  . Total Protein 11/25/2012 7.1  6.0 - 8.3 g/dL Final  . Albumin 84/69/6295 3.8  3.5 - 5.2 g/dL Final  . AST 28/41/3244 15  0 - 37 U/L Final  . ALT 11/25/2012 9  0 - 35 U/L Final  . Alkaline Phosphatase 11/25/2012 99  39 - 117 U/L Final  . Total Bilirubin 11/25/2012 0.4  0.3 - 1.2 mg/dL Final  . GFR calc non Af Amer 11/25/2012 23* >90 mL/min Final  . GFR calc Af Amer 11/25/2012 27* >90 mL/min Final   Comment: (NOTE)                          The eGFR has been calculated using the CKD EPI equation.                          This calculation has not been validated in all clinical situations.                          eGFR's persistently <90 mL/min signify possible Chronic Kidney                           Disease.  . Pro B Natriuretic peptide (BNP) 11/25/2012 2980.0* 0 - 450 pg/mL Final    PATHOLOGY: None.  Urinalysis    Component Value Date/Time   COLORURINE YELLOW 09/13/2011 0940   APPEARANCEUR CLEAR 09/13/2011 0940   LABSPEC 1.020 09/13/2011 0940   PHURINE 5.5 09/13/2011 0940   GLUCOSEU NEGATIVE 09/13/2011 0940   HGBUR NEGATIVE  09/13/2011 0940   BILIRUBINUR NEGATIVE 09/13/2011 0940   KETONESUR NEGATIVE 09/13/2011 0940   PROTEINUR NEGATIVE 09/13/2011 0940   UROBILINOGEN 0.2 09/13/2011 0940   NITRITE POSITIVE* 09/13/2011 0940   LEUKOCYTESUR TRACE* 09/13/2011 0940    RADIOGRAPHIC STUDIES: No results found.  ASSESSMENT: #1. Chronic lymphocytic leukemia, stable. #2. 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 Current ferritin is 60. #3. Chronic atrial fibrillation with left bundle branch block, status post dual-chamber pacemaker in September 2008. #4. Chronic kidney disease.   PLAN: #1. Feraheme intravenously today 510 mg. Once iron stores are corrected, patient will become a candidate for Procrit. #2. Return in 4 weeks for CBC. #3. Surgery to remove carbuncle so chronic bleeding is controlled. #4. Continue anticoagulation.   All questions were answered. The patient knows to call the clinic with any problems, questions or concerns. We can certainly see the patient much sooner if necessary.   I spent 30 minutes counseling the patient face to face. The total time spent in the appointment was 25 minutes.    Maurilio Lovely, MD 12/23/2012 10:40 AM

## 2012-12-23 NOTE — Progress Notes (Signed)
Labs drawn today for cbc/diff 

## 2012-12-23 NOTE — Patient Instructions (Signed)
[facility] Discharge Instructions  RECOMMENDATIONS MADE BY THE CONSULTANT AND ANY TEST RESULTS WILL BE SENT TO YOUR REFERRING PHYSICIAN.  EXAM FINDINGS BY THE PHYSICIAN TODAY AND SIGNS OR SYMPTOMS TO REPORT TO CLINIC OR PRIMARY PHYSICIAN: Exam and findings as discussed by Dr. Zigmund Daniel. Will give you iron today and hopefully that will make you feel better.  Talk to your cardiologist about the medication that you stopped.  They need to know when you stop any medication and why you are stopping it. Also, talk with them about the urethral caruncle that you have to see if there is a way it could be removed by local anesthesia with cardiology support.  MEDICATIONS PRESCRIBED:  none  INSTRUCTIONS/FOLLOW-UP: Follow-up in 4 weeks.  Thank you for choosing [facility] to provide your oncology and hematology care.  To afford each patient quality time with our providers, please arrive at least 15 minutes before your scheduled appointment time.  With your help, our goal is to use those 15 minutes to complete the necessary work-up to ensure our physicians have the information they need to help with your evaluation and healthcare recommendations.    Effective January 1st, 2014, we ask that you re-schedule your appointment with our physicians should you arrive 10 or more minutes late for your appointment.  We strive to give you quality time with our providers, and arriving late affects you and other patients whose appointments are after yours.    Again, thank you for choosing [facility].  Our hope is that these requests will decrease the amount of time that you wait before being seen by our physicians.       _____________________________________________________________  Should you have questions after your visit to [facility], please contact our office at [phone] between the hours of 8:30 a.m. and 5:00 p.m.  Voicemails left after 4:30 p.m.  will not be returned until the following business day.  For prescription refill requests, have your pharmacy contact our office with your prescription refill request.

## 2012-12-26 ENCOUNTER — Other Ambulatory Visit (INDEPENDENT_AMBULATORY_CARE_PROVIDER_SITE_OTHER): Payer: Medicare Other | Admitting: *Deleted

## 2012-12-26 DIAGNOSIS — Z7901 Long term (current) use of anticoagulants: Secondary | ICD-10-CM

## 2012-12-28 ENCOUNTER — Ambulatory Visit (INDEPENDENT_AMBULATORY_CARE_PROVIDER_SITE_OTHER): Payer: Medicare Other | Admitting: *Deleted

## 2012-12-28 DIAGNOSIS — Z7901 Long term (current) use of anticoagulants: Secondary | ICD-10-CM

## 2012-12-28 LAB — POCT INR: INR: 3.7

## 2013-01-11 ENCOUNTER — Ambulatory Visit (INDEPENDENT_AMBULATORY_CARE_PROVIDER_SITE_OTHER): Payer: Medicare Other | Admitting: *Deleted

## 2013-01-11 DIAGNOSIS — Z7901 Long term (current) use of anticoagulants: Secondary | ICD-10-CM

## 2013-01-19 ENCOUNTER — Encounter (HOSPITAL_COMMUNITY): Payer: Self-pay

## 2013-01-19 ENCOUNTER — Encounter (HOSPITAL_COMMUNITY): Payer: Medicare Other | Attending: Hematology and Oncology

## 2013-01-19 VITALS — BP 145/71 | HR 76 | Temp 97.6°F | Resp 20 | Wt 144.0 lb

## 2013-01-19 DIAGNOSIS — D500 Iron deficiency anemia secondary to blood loss (chronic): Secondary | ICD-10-CM

## 2013-01-19 DIAGNOSIS — C9110 Chronic lymphocytic leukemia of B-cell type not having achieved remission: Secondary | ICD-10-CM | POA: Insufficient documentation

## 2013-01-19 DIAGNOSIS — N189 Chronic kidney disease, unspecified: Secondary | ICD-10-CM | POA: Insufficient documentation

## 2013-01-19 DIAGNOSIS — Z8719 Personal history of other diseases of the digestive system: Secondary | ICD-10-CM

## 2013-01-19 DIAGNOSIS — D649 Anemia, unspecified: Secondary | ICD-10-CM

## 2013-01-19 HISTORY — DX: Personal history of other diseases of the digestive system: Z87.19

## 2013-01-19 HISTORY — DX: Iron deficiency anemia secondary to blood loss (chronic): D50.0

## 2013-01-19 LAB — CBC WITH DIFFERENTIAL/PLATELET
Basophils Absolute: 0.1 10*3/uL (ref 0.0–0.1)
Basophils Relative: 1 % (ref 0–1)
Eosinophils Absolute: 0.3 10*3/uL (ref 0.0–0.7)
Hemoglobin: 9.7 g/dL — ABNORMAL LOW (ref 12.0–15.0)
Lymphs Abs: 4.3 10*3/uL — ABNORMAL HIGH (ref 0.7–4.0)
Monocytes Relative: 5 % (ref 3–12)
Neutro Abs: 6.1 10*3/uL (ref 1.7–7.7)
Neutrophils Relative %: 54 % (ref 43–77)
RBC: 3.17 MIL/uL — ABNORMAL LOW (ref 3.87–5.11)
RDW: 17.3 % — ABNORMAL HIGH (ref 11.5–15.5)

## 2013-01-19 MED ORDER — FERUMOXYTOL INJECTION 510 MG/17 ML
510.0000 mg | Freq: Once | INTRAVENOUS | Status: AC
  Administered 2013-01-19: 510 mg via INTRAVENOUS
  Filled 2013-01-19: qty 17

## 2013-01-19 NOTE — Progress Notes (Signed)
[name] Maudlin, MD [address] [place] [address]  Chronic lymphocytic leukemia - Plan: CBC with Differential, Reticulocytes, Lactate dehydrogenase, ferumoxytol (FERAHEME) injection 510 mg, CBC with Differential, Basic metabolic panel, CBC with Differential, Basic metabolic panel, Reticulocytes, Lactate dehydrogenase  Anemia, normocytic normochromic - Plan: CBC with Differential, Reticulocytes, Lactate dehydrogenase, ferumoxytol (FERAHEME) injection 510 mg, CBC with Differential, Basic metabolic panel, CBC with Differential, Basic metabolic panel, Reticulocytes, Lactate dehydrogenase  Chronic kidney disease - Plan: CBC with Differential, Reticulocytes, Lactate dehydrogenase, ferumoxytol (FERAHEME) injection 510 mg, CBC with Differential, Basic metabolic panel, CBC with Differential, Basic metabolic panel, Reticulocytes, Lactate dehydrogenase  Iron deficiency anemia secondary to blood loss (chronic)  CURRENT THERAPY:Feraheme RX  INTERVAL HISTORY: [name] 79 y.o. female returns for followup of chronic lymphocytic leukemia and normocytic normochromic anemia with iron deficiency, due for 2nd dose of intravenous Feraheme today. Since the time of last visit her fatigue is improving after first dose of Feraheme. She c/o rectal bleed intermittently and she is due to see gastroenterology. Bleeding from urethral carbuncle stopped.  Appetite is good with no nausea, vomiting, diarrhea, constipation, palpitations, PND, orthopnea. She is uptodate with her flu vaccine.    Past Medical History  Diagnosis Date  . Sick sinus syndrome 11/2006    paroxysmal atrial fibrillation; dual-chamber pacemaker in 11/2006  . Aortic stenosis     mild  . Left bundle branch block   . Congestive heart failure     Congestive heart failure with normal ejection fraction  . Arteriosclerotic cardiovascular disease (ASCVD)     coronary angio in 10/09:50% left anterior descending; 80% PDA; medical  therapy advisedOrthostatic decrease in blood pressure  . Epistaxis     mild with negative ENT evaluation  . Dizziness     chronic  . Osteoarthritis   . Irritable bowel syndrome   . Urinary frequency     with incontinence  . Gastroesophageal reflux disease     Esophageal dilatation for stricture in 09/2011  . Rosacea   . Chronic lymphocytic leukemia     Stage I with anemia H./H. of 9/27 in 11/09 with normal MCV; iron deficiency in bone marrow  . Orthostatic hypotension   . Chronic anticoagulation   . Diverticulosis   . 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 Chronic kidney disease      Creatinine of 1.7 in 8/08, 1.4 in 10/08 and 1.5 in 11/09  . Urethral caruncle   . Iron deficiency anemia secondary to blood loss (chronic) 01/19/2013    has HYPERLIPIDEMIA; HYPERTENSION; Arteriosclerotic cardiovascular disease (ASCVD); Chronic anticoagulation; Sick sinus syndrome; Aortic stenosis; Congestive heart failure; Chronic kidney disease; Gastroesophageal reflux disease; Chronic lymphocytic leukemia; Transient blindness; Paroxysmal atrial fibrillation; Pacemaker; Hematuria; Anemia, normocytic normochromic; Fasting hyperglycemia; and Iron deficiency anemia secondary to blood loss (chronic) on her problem list.     is allergic to vesicare; detrol; sanctura; sulfa antibiotics; sulfonamide derivatives; and trimethoprim.  [name] does not currently have medications on file.  Past Surgical History  Procedure Laterality Date  . Cholecystectomy    . Cervical spine surgery    . Lumbar disc surgery    . Bladder suspension    . Abdominal hysterectomy  1972  . Cataract extraction      Bilateral  . Esophagogastroduodenoscopy  01/09/09    Rourk -noncritical Schatzki's ring, small hiatal hernia, fundal gland polyps, bile-stained gastric mucosa, polypoid lesion straddling D1  and D2 1-2 cm, localized lymphangitic-appearing  mucosa at D2 biopsied (adenomatous polyp with high grade glandular dysplasia  . A-v cardiac pacemaker insertion  11/2006    [employer]      PHYSICAL EXAMINATION  ECOG PERFORMANCE STATUS: 1 - Symptomatic but completely ambulatory  Filed Vitals:   01/19/13 1050  BP: 124/65  Pulse: 76  Temp: 97.6 F (36.4 C)  Resp: 20    GENERAL:alert and no distress SKIN: skin lesions two on the chest and two on the back -were treated and in healing stage HEAD: Normocephalic EYES: PERRLA, EOMI, Conjunctiva are pink and non-injected, sclera clear EARS: External ears normal OROPHARYNX:no erythema, lips, buccal mucosa, and tongue normal and mucous membranes are moist  NECK: supple, no adenopathy, no JVD, non-tender LYMPH:  no palpable lymphadenopathy LUNGS: clear to auscultation , coarse sounds heard HEART: regular rate & rhythm ABDOMEN:abdomen soft, non-tender, normal bowel sounds and no masses or organomegaly BACK: Back symmetric, no curvature. EXTREMITIES:no edema, no clubbing  NEURO: alert & oriented x 3 with fluent speech   LABORATORY DATA:  CBC    Component Value Date/Time   WBC 13.6* 12/23/2012 1007   RBC 2.94* 12/23/2012 1007   RBC 3.34* 03/20/2008 1411   HGB 8.8* 12/23/2012 1007   HCT 28.2* 12/23/2012 1007   PLT 224 12/23/2012 1007   MCV 95.9 12/23/2012 1007   MCH 29.9 12/23/2012 1007   MCHC 31.2 12/23/2012 1007   RDW 14.2 12/23/2012 1007   LYMPHSABS 5.8* 12/23/2012 1007   MONOABS 0.6 12/23/2012 1007   EOSABS 0.5 12/23/2012 1007   BASOSABS 0.1 12/23/2012 1007        ASSESSMENT:  #1. Chronic lymphocytic leukemia, stable.  #2.Anemia secondary to CLL, chronic blood loss(rectal bleed and bleeding from urethral carbuncle) and Chronic kidney disease. #3. Chronic atrial fibrillation with left bundle branch block, status post dual-chamber pacemaker in September 2008.  #4. Chronic kidney disease.  #5. F/u with path report from dermatology  PLAN: #1. Feraheme 2nd dose  intravenously today 510 mg. We will reasses the need of erythroid growth factors once iron stores are corrected #2. Return in 3 weeks for CBC, BMP.  #3. F/u with Gastroenterology for evaluation of rectal bleed   All questions were answered. The patient knows to call the clinic with any problems, questions or concerns. We can certainly see the patient much sooner if necessary.  Lajuana Ripple, Harshan Kearley

## 2013-01-19 NOTE — Patient Instructions (Signed)
[facility] Discharge Instructions  RECOMMENDATIONS MADE BY THE CONSULTANT AND ANY TEST RESULTS WILL BE SENT TO YOUR REFERRING PHYSICIAN.  Appointment with MD and lab work in 3-4 weeks.  Thank you for choosing [facility] to provide your oncology and hematology care.  To afford each patient quality time with our providers, please arrive at least 15 minutes before your scheduled appointment time.  With your help, our goal is to use those 15 minutes to complete the necessary work-up to ensure our physicians have the information they need to help with your evaluation and healthcare recommendations.    Effective January 1st, 2014, we ask that you re-schedule your appointment with our physicians should you arrive 10 or more minutes late for your appointment.  We strive to give you quality time with our providers, and arriving late affects you and other patients whose appointments are after yours.    Again, thank you for choosing [facility].  Our hope is that these requests will decrease the amount of time that you wait before being seen by our physicians.       _____________________________________________________________  Should you have questions after your visit to [facility], please contact our office at [phone] between the hours of 8:30 a.m. and 5:00 p.m.  Voicemails left after 4:30 p.m. will not be returned until the following business day.  For prescription refill requests, have your pharmacy contact our office with your prescription refill request.

## 2013-01-19 NOTE — Progress Notes (Signed)
Feraheme 510mg  given IVP.  Tolerated well.

## 2013-01-25 ENCOUNTER — Ambulatory Visit (INDEPENDENT_AMBULATORY_CARE_PROVIDER_SITE_OTHER): Payer: Medicare Other | Admitting: *Deleted

## 2013-01-25 DIAGNOSIS — Z7901 Long term (current) use of anticoagulants: Secondary | ICD-10-CM

## 2013-01-25 LAB — POCT INR: INR: 1.9

## 2013-02-13 ENCOUNTER — Encounter (HOSPITAL_COMMUNITY): Payer: Medicare Other | Attending: Hematology and Oncology

## 2013-02-13 ENCOUNTER — Encounter (HOSPITAL_COMMUNITY): Payer: Self-pay

## 2013-02-13 ENCOUNTER — Ambulatory Visit (INDEPENDENT_AMBULATORY_CARE_PROVIDER_SITE_OTHER): Payer: Medicare Other | Admitting: *Deleted

## 2013-02-13 ENCOUNTER — Encounter (HOSPITAL_COMMUNITY): Payer: Medicare Other

## 2013-02-13 VITALS — BP 111/60 | HR 74 | Temp 98.3°F | Resp 18 | Wt 143.6 lb

## 2013-02-13 DIAGNOSIS — C9110 Chronic lymphocytic leukemia of B-cell type not having achieved remission: Secondary | ICD-10-CM

## 2013-02-13 DIAGNOSIS — Z7901 Long term (current) use of anticoagulants: Secondary | ICD-10-CM

## 2013-02-13 DIAGNOSIS — L0293 Carbuncle, unspecified: Secondary | ICD-10-CM

## 2013-02-13 DIAGNOSIS — N189 Chronic kidney disease, unspecified: Secondary | ICD-10-CM

## 2013-02-13 DIAGNOSIS — L0292 Furuncle, unspecified: Secondary | ICD-10-CM

## 2013-02-13 DIAGNOSIS — D500 Iron deficiency anemia secondary to blood loss (chronic): Secondary | ICD-10-CM

## 2013-02-13 LAB — CBC
HCT: 33.5 % — ABNORMAL LOW (ref 36.0–46.0)
MCHC: 31.6 g/dL (ref 30.0–36.0)
MCV: 100 fL (ref 78.0–100.0)
Platelets: 162 10*3/uL (ref 150–400)
RBC: 3.35 MIL/uL — ABNORMAL LOW (ref 3.87–5.11)
WBC: 11.5 10*3/uL — ABNORMAL HIGH (ref 4.0–10.5)

## 2013-02-13 LAB — POCT INR: INR: 1.5

## 2013-02-13 MED ORDER — PHENAZOPYRIDINE HCL 200 MG PO TABS: 200.0000 mg | ORAL_TABLET | Freq: Three times a day (TID) | ORAL | Status: DC | PRN

## 2013-02-13 NOTE — Progress Notes (Signed)
[facility]  OFFICE PROGRESS NOTE  [name] Elbert Ewings, MD [address] [place] [address]  DIAGNOSIS: Chronic lymphocytic leukemia - Plan: CBC, CBC  Iron deficiency anemia secondary to blood loss (chronic) - Plan: CBC, Ferritin, CBC, Ferritin  Chronic kidney disease  Carbuncle of urethra  Chief Complaint  Patient presents with  . Leukemia    CLL  . Anemia    chronic blood loss    CURRENT THERAPY: Intermittent Feraheme infusionfor chronic blood loss secondary to the use of anticoagulants and the presence of a urethral carbuncle.  INTERVAL HISTORY: [name] 79 y.o. female returns for consideration of continuing intravenous iron therapy for chronic blood loss anemia in the setting of chronic lymphocytic leukemia, anticoagulation, and a carbuncle of the urethra. She has been followed by home health now once per week. Fortunately she's had no further urethral bleeding. Shortness of breath has improved. Her exercise tolerance has improved. She denies any nausea, vomiting, diarrhea, but still has urinary frequency and urgency during the day and during the night. She continues to use a walker and cane. She denies any nausea, vomiting, incontinence, skin rash, headache, or seizures.   MEDICAL HISTORY: Past Medical History  Diagnosis Date  . Sick sinus syndrome 11/2006    paroxysmal atrial fibrillation; dual-chamber pacemaker in 11/2006  . Aortic stenosis     mild  . Left bundle branch block   . Congestive heart failure     Congestive heart failure with normal ejection fraction  . Arteriosclerotic cardiovascular disease (ASCVD)     coronary angio in 10/09:50% left anterior descending; 80% PDA; medical therapy advisedOrthostatic decrease in blood pressure  . Epistaxis     mild with negative ENT evaluation  . Dizziness     chronic  . Osteoarthritis   . Irritable bowel syndrome   . Urinary frequency     with  incontinence  . Gastroesophageal reflux disease     Esophageal dilatation for stricture in 09/2011  . Rosacea   . Chronic lymphocytic leukemia     Stage I with anemia H./H. of 9/27 in 11/09 with normal MCV; iron deficiency in bone marrow  . Orthostatic hypotension   . Chronic anticoagulation   . Diverticulosis   . 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 Chronic kidney disease      Creatinine of 1.7 in 8/08, 1.4 in 10/08 and 1.5 in 11/09  . Urethral caruncle   . Iron deficiency anemia secondary to blood loss (chronic) 01/19/2013  . History of rectal bleeding 01/19/2013    INTERIM HISTORY: has HYPERLIPIDEMIA; HYPERTENSION; Arteriosclerotic cardiovascular disease (ASCVD); Chronic anticoagulation; Sick sinus syndrome; Aortic stenosis; Congestive heart failure; Chronic kidney disease; Gastroesophageal reflux disease; Chronic lymphocytic leukemia; Transient blindness; Paroxysmal atrial fibrillation; Pacemaker; Hematuria; Anemia, normocytic normochromic; Fasting hyperglycemia; Iron deficiency anemia secondary to blood loss (chronic); and History of rectal bleeding on her problem list.    ALLERGIES:  is allergic to vesicare; detrol; sanctura; sulfa antibiotics; sulfonamide derivatives; and trimethoprim.  MEDICATIONS: has a current medication list which includes the following prescription(s): acetaminophen, albuterol, calcium-vitamin d, cephalexin, losartan, metoprolol tartrate, multi-vitamins, multivitamin with minerals, nitroglycerin, omeprazole, pravastatin, solifenacin, torsemide, verapamil, warfarin, estradiol, and metolazone, and the following Facility-Administered Medications: epoetin alfa.  SURGICAL HISTORY:  Past Surgical History  Procedure Laterality Date  . Cholecystectomy    . Cervical spine surgery    .  Lumbar disc surgery    . Bladder suspension    . Abdominal hysterectomy  1972  . Cataract extraction       Bilateral  . Esophagogastroduodenoscopy  01/09/09    Rourk -noncritical Schatzki's ring, small hiatal hernia, fundal gland polyps, bile-stained gastric mucosa, polypoid lesion straddling D1 and D2 1-2 cm, localized lymphangitic-appearing mucosa at D2 biopsied (adenomatous polyp with high grade glandular dysplasia  . A-v cardiac pacemaker insertion  11/2006    [employer]    FAMILY HISTORY: family history includes Cancer in her father; Diabetes in her mother; Heart failure in her father.  SOCIAL HISTORY:  reports that she has never smoked. She has never used smokeless tobacco. She reports that she does not drink alcohol or use illicit drugs.  REVIEW OF SYSTEMS:  Other than that discussed above is noncontributory.  PHYSICAL EXAMINATION: ECOG PERFORMANCE STATUS: 1 - Symptomatic but completely ambulatory  Blood pressure 111/60, pulse 74, temperature 98.3 F (36.8 C), temperature source Oral, resp. rate 18, weight 143 lb 9.6 oz (65.137 kg).  GENERAL:alert, no distress and comfortable SKIN: skin color, texture, turgor are normal, no rashes or significant lesions EYES: PERLA; Conjunctiva are pink and non-injected, sclera clear OROPHARYNX:no exudate, no erythema on lips, buccal mucosa, or tongue. NECK: supple, thyroid normal size, non-tender, without nodularity. No masses CHEST: normal AP diameter with no breast masses.pacemaker in place. LYMPH:  no palpable lymphadenopathy in the cervical, axillary or inguinal LUNGS: clear to auscultation and percussion with normal breathing effort HEART: regular rate & rhythm and no murmurs. ABDOMEN:abdomen soft, non-tender and normal bowel sounds MUSCULOSKELETAL:no cyanosis of digits and no clubbing. Range of motion normal.  NEURO: alert & oriented x 3 with fluent speech, no focal motor/sensory deficits   LABORATORY DATA: Office Visit on 02/13/2013  Component Date Value Range Status  . WBC 02/13/2013 11.5* 4.0 - 10.5 K/uL Final  . RBC 02/13/2013  3.35* 3.87 - 5.11 MIL/uL Final  . Hemoglobin 02/13/2013 10.6* 12.0 - 15.0 g/dL Final  . HCT 78/29/5621 33.5* 36.0 - 46.0 % Final  . MCV 02/13/2013 100.0  78.0 - 100.0 fL Final  . MCH 02/13/2013 31.6  26.0 - 34.0 pg Final  . MCHC 02/13/2013 31.6  30.0 - 36.0 g/dL Final  . RDW 30/86/5784 16.1* 11.5 - 15.5 % Final  . Platelets 02/13/2013 162  150 - 400 K/uL Final  Anti-coag visit on 01/25/2013  Component Date Value Range Status  . INR 01/25/2013 1.9   Final  Office Visit on 01/19/2013  Component Date Value Range Status  . WBC 01/19/2013 11.4* 4.0 - 10.5 K/uL Final  . RBC 01/19/2013 3.17* 3.87 - 5.11 MIL/uL Final  . Hemoglobin 01/19/2013 9.7* 12.0 - 15.0 g/dL Final  . HCT 69/62/9528 31.5* 36.0 - 46.0 % Final  . MCV 01/19/2013 99.4  78.0 - 100.0 fL Final  . MCH 01/19/2013 30.6  26.0 - 34.0 pg Final  . MCHC 01/19/2013 30.8  30.0 - 36.0 g/dL Final  . RDW 41/32/4401 17.3* 11.5 - 15.5 % Final  . Platelets 01/19/2013 173  150 - 400 K/uL Final  . Neutrophils Relative % 01/19/2013 54  43 - 77 % Final  . Neutro Abs 01/19/2013 6.1  1.7 - 7.7 K/uL Final  . Lymphocytes Relative 01/19/2013 38  12 - 46 % Final  . Lymphs Abs 01/19/2013 4.3* 0.7 - 4.0 K/uL Final  . Monocytes Relative 01/19/2013 5  3 - 12 % Final  . Monocytes Absolute 01/19/2013 0.6  0.1 - 1.0  K/uL Final  . Eosinophils Relative 01/19/2013 3  0 - 5 % Final  . Eosinophils Absolute 01/19/2013 0.3  0.0 - 0.7 K/uL Final  . Basophils Relative 01/19/2013 1  0 - 1 % Final  . Basophils Absolute 01/19/2013 0.1  0.0 - 0.1 K/uL Final    PATHOLOGY:  None new.  Urinalysis    Component Value Date/Time   COLORURINE YELLOW 09/13/2011 0940   APPEARANCEUR CLEAR 09/13/2011 0940   LABSPEC 1.020 09/13/2011 0940   PHURINE 5.5 09/13/2011 0940   GLUCOSEU NEGATIVE 09/13/2011 0940   HGBUR NEGATIVE 09/13/2011 0940   BILIRUBINUR NEGATIVE 09/13/2011 0940   KETONESUR NEGATIVE 09/13/2011 0940   PROTEINUR NEGATIVE 09/13/2011 0940   UROBILINOGEN 0.2 09/13/2011 0940     NITRITE POSITIVE* 09/13/2011 0940   LEUKOCYTESUR TRACE* 09/13/2011 0940    RADIOGRAPHIC STUDIES: No results found.  ASSESSMENT:  #1.chronic lymphocytic leukemia, stable. #2. Mixed anemia due to CLL and chronic blood loss, improved after iron infusion and after no longer experiencing urethral bleeding. #3. Chronic kidney disease. #4. Chronic atrial fibrillation with left bundle branch block, status post dual-chamber pacemaker in September 2008, stable. #5. Status post resection of skin cancers, basal cell found on the right shoulder.   PLAN:  #1. Followup in 4 weeks with CBC and ferritin. #2. Trial of Pyridium 200 mg 3 times a day. #3. Patient was told to call should any symptoms occur that are troublesome and persistent.   All questions were answered. The patient knows to call the clinic with any problems, questions or concerns. We can certainly see the patient much sooner if necessary.   I spent 25 minutes counseling the patient face to face. The total time spent in the appointment was 30 minutes.    Maurilio Lovely, MD 02/13/2013 2:07 PM

## 2013-02-13 NOTE — Patient Instructions (Signed)
[facility] Discharge Instructions  RECOMMENDATIONS MADE BY THE CONSULTANT AND ANY TEST RESULTS WILL BE SENT TO YOUR REFERRING PHYSICIAN.  EXAM FINDINGS BY THE PHYSICIAN TODAY AND SIGNS OR SYMPTOMS TO REPORT TO CLINIC OR PRIMARY PHYSICIAN: Exam and findings as discussed by Dr. Zigmund Daniel.  Your hemoglobin is 10.6 which has improved.  Report increased fatigue or shortness of breath.  MEDICATIONS PRESCRIBED:  Pyridium - take as directed  INSTRUCTIONS/FOLLOW-UP: Follow-up in 4 weeks with blood work and MD visit.  Thank you for choosing [facility] to provide your oncology and hematology care.  To afford each patient quality time with our providers, please arrive at least 15 minutes before your scheduled appointment time.  With your help, our goal is to use those 15 minutes to complete the necessary work-up to ensure our physicians have the information they need to help with your evaluation and healthcare recommendations.    Effective January 1st, 2014, we ask that you re-schedule your appointment with our physicians should you arrive 10 or more minutes late for your appointment.  We strive to give you quality time with our providers, and arriving late affects you and other patients whose appointments are after yours.    Again, thank you for choosing [facility].  Our hope is that these requests will decrease the amount of time that you wait before being seen by our physicians.       _____________________________________________________________  Should you have questions after your visit to [facility], please contact our office at [phone] between the hours of 8:30 a.m. and 5:00 p.m.  Voicemails left after 4:30 p.m. will not be returned until the following business day.  For prescription refill requests, have your pharmacy contact our office with your prescription refill request.

## 2013-02-13 NOTE — Progress Notes (Signed)
[name] presented for [employer]. Labs per MD order drawn via Peripheral Line 23 gauge needle inserted in left AC  Good blood return present. Procedure without incident.  Needle removed intact. Patient tolerated procedure well.

## 2013-02-27 ENCOUNTER — Ambulatory Visit (INDEPENDENT_AMBULATORY_CARE_PROVIDER_SITE_OTHER): Payer: Medicare Other | Admitting: *Deleted

## 2013-02-27 DIAGNOSIS — Z7901 Long term (current) use of anticoagulants: Secondary | ICD-10-CM

## 2013-02-27 LAB — POCT INR: INR: 1.8

## 2013-03-12 NOTE — Progress Notes (Signed)
-  Rescheduled due to being 1.5 hours later for noon appointment-  [name]

## 2013-03-13 ENCOUNTER — Encounter (HOSPITAL_COMMUNITY): Payer: Medicare Other

## 2013-03-13 ENCOUNTER — Ambulatory Visit (HOSPITAL_BASED_OUTPATIENT_CLINIC_OR_DEPARTMENT_OTHER): Payer: Medicare Other | Admitting: Oncology

## 2013-03-13 DIAGNOSIS — C9110 Chronic lymphocytic leukemia of B-cell type not having achieved remission: Secondary | ICD-10-CM

## 2013-03-13 DIAGNOSIS — Z7901 Long term (current) use of anticoagulants: Secondary | ICD-10-CM

## 2013-03-13 LAB — CBC WITH DIFFERENTIAL/PLATELET
Basophils Relative: 1 % (ref 0–1)
Eosinophils Relative: 2 % (ref 0–5)
HCT: 33.4 % — ABNORMAL LOW (ref 36.0–46.0)
Hemoglobin: 10.5 g/dL — ABNORMAL LOW (ref 12.0–15.0)
Lymphocytes Relative: 45 % (ref 12–46)
Lymphs Abs: 5.2 10*3/uL — ABNORMAL HIGH (ref 0.7–4.0)
MCV: 102.1 fL — ABNORMAL HIGH (ref 78.0–100.0)
Monocytes Absolute: 0.5 10*3/uL (ref 0.1–1.0)
Monocytes Relative: 5 % (ref 3–12)
Neutro Abs: 5.6 10*3/uL (ref 1.7–7.7)
RBC: 3.27 MIL/uL — ABNORMAL LOW (ref 3.87–5.11)
RDW: 15.2 % (ref 11.5–15.5)
WBC: 11.6 10*3/uL — ABNORMAL HIGH (ref 4.0–10.5)

## 2013-03-13 NOTE — Progress Notes (Signed)
[name] presented for [employer]. Labs per MD order drawn via Peripheral Line 21 gauge needle inserted in right antecubital.  Good blood return present. Procedure without incident.  Needle removed intact. Patient tolerated procedure well.

## 2013-03-20 ENCOUNTER — Ambulatory Visit (INDEPENDENT_AMBULATORY_CARE_PROVIDER_SITE_OTHER): Payer: Medicare Other | Admitting: *Deleted

## 2013-03-20 ENCOUNTER — Ambulatory Visit (INDEPENDENT_AMBULATORY_CARE_PROVIDER_SITE_OTHER): Payer: Medicare Other | Admitting: Cardiovascular Disease

## 2013-03-20 ENCOUNTER — Encounter: Payer: Self-pay | Admitting: Cardiovascular Disease

## 2013-03-20 VITALS — BP 150/54 | HR 68 | Ht 60.0 in | Wt 146.0 lb

## 2013-03-20 DIAGNOSIS — Z7901 Long term (current) use of anticoagulants: Secondary | ICD-10-CM

## 2013-03-20 DIAGNOSIS — I5032 Chronic diastolic (congestive) heart failure: Secondary | ICD-10-CM

## 2013-03-20 DIAGNOSIS — I4891 Unspecified atrial fibrillation: Secondary | ICD-10-CM

## 2013-03-20 DIAGNOSIS — I350 Nonrheumatic aortic (valve) stenosis: Secondary | ICD-10-CM

## 2013-03-20 DIAGNOSIS — R0602 Shortness of breath: Secondary | ICD-10-CM

## 2013-03-20 DIAGNOSIS — I359 Nonrheumatic aortic valve disorder, unspecified: Secondary | ICD-10-CM

## 2013-03-20 DIAGNOSIS — I10 Essential (primary) hypertension: Secondary | ICD-10-CM

## 2013-03-20 DIAGNOSIS — I480 Paroxysmal atrial fibrillation: Secondary | ICD-10-CM

## 2013-03-20 LAB — POCT INR: INR: 1.3

## 2013-03-20 NOTE — Patient Instructions (Addendum)
Your physician wants you to follow-up in: 6 months You will receive a reminder letter in the mail two months in advance. If you don't receive a letter, please call our office to schedule the follow-up appointment.     Decrease torsemide to 10 mg by mouth every OTHER day

## 2013-03-20 NOTE — Progress Notes (Signed)
Patient ID: [name], female   DOB: 03-01-1916, 80 y.o.   MRN: [id number]      SUBJECTIVE: [name] had been doing well until Christmas when she had a lot of guests. Since that time, she has felt fatigued. She apparently received 2 IV iron transfusions shortly before Christmas which helped her considerably. She's had some mild leg swelling, left > right. She does not like taking torsemide as she urinates too frequently, and has been taking 10 mg daily rather than the prescribed 20 mg daily.    Allergies  Allergen Reactions  . Vesicare [Solifenacin] Other (See Comments)    Severe stomach pain  . Detrol [Tolterodine Tartrate] Other (See Comments)    Patient states: "I was told to never take it again, I do not recall the reaction"  . Sanctura [Trospium Chloride] Nausea Only and Other (See Comments)    Intense lower abd pain  . Sulfa Antibiotics Other (See Comments)    Extreme dizziness  . Sulfonamide Derivatives Other (See Comments)    REACTION: GI distress  . Trimethoprim     Other reaction(s): Dermatitis    Current Outpatient Prescriptions  Medication Sig Dispense Refill  . acetaminophen (TYLENOL) 500 MG tablet Take 1,000 mg by mouth 4 (four) times daily.        [name] albuterol (VENTOLIN HFA) 108 (90 BASE) MCG/ACT inhaler 2 puffs. Inhale 2 puffs into the lungs every 6 (six) hours as needed for Wheezing.      . calcium-vitamin D (GNP CALCIUM 500/D) 500-200 MG-UNIT per tablet 1 tablet. Take 1 tablet by mouth 2 times daily with meals.      [name] estradiol (ESTRACE VAGINAL) 0.1 MG/GM vaginal cream Place 2 g vaginally twice a week.      . losartan (COZAAR) 100 MG tablet 100 mg. Take 100 mg by mouth daily.      . metoprolol tartrate (LOPRESSOR) 25 MG tablet TAKE 1 TABLET (25 MG TOTAL) BY MOUTH 2 (TWO) TIMES DAILY.  180 tablet  2  . Multiple Vitamin (MULTI-VITAMINS) TABS 1 tablet. Take 1 tablet by mouth daily.      . nitroGLYCERIN (NITROSTAT) 0.4 MG SL tablet Place 1 tablet (0.4 mg total)  under the tongue every 5 (five) minutes as needed for chest pain.  24 tablet  4  . omeprazole (PRILOSEC) 20 MG capsule 20 mg. Take 20 mg by mouth daily.      . pravastatin (PRAVACHOL) 40 MG tablet 40 mg. Take 40 mg by mouth daily.      [name] torsemide (DEMADEX) 20 MG tablet 20 mg. Take 20 mg by mouth daily.      . verapamil (CALAN-SR) 180 MG CR tablet TAKE 1 TABLET (180 MG TOTAL)  BY MOUTH AT BEDTIME.  90 tablet  2  . warfarin (COUMADIN) 2.5 MG tablet 2.5 mg. Take 1/2 a tablet mon, wed, and fri. Take a whole tablet tues, thurs, sat, sun      . metolazone (ZAROXOLYN) 2.5 MG tablet Take 1 tablet (2.5 mg total) by mouth as needed. Take 1 tablet by mouth as needed for leg swelling/SOB  30 tablet  2   No current facility-administered medications for this visit.   Facility-Administered Medications Ordered in Other Visits  Medication Dose Route Frequency Provider Last Rate Last Dose  . epoetin alfa (EPOGEN,PROCRIT) injection 40,000 Units  40,000 Units Subcutaneous Once Pieter Partridge, MD        Past Medical History  Diagnosis Date  . Sick sinus syndrome  11/2006    paroxysmal atrial fibrillation; dual-chamber pacemaker in 11/2006  . Aortic stenosis     mild  . Left bundle branch block   . Congestive heart failure     Congestive heart failure with normal ejection fraction  . Arteriosclerotic cardiovascular disease (ASCVD)     coronary angio in 10/09:50% left anterior descending; 80% PDA; medical therapy advisedOrthostatic decrease in blood pressure  . Epistaxis     mild with negative ENT evaluation  . Dizziness     chronic  . Osteoarthritis   . Irritable bowel syndrome   . Urinary frequency     with incontinence  . Gastroesophageal reflux disease     Esophageal dilatation for stricture in 09/2011  . Rosacea   . Chronic lymphocytic leukemia     Stage I with anemia H./H. of 9/27 in 11/09 with normal MCV; iron deficiency in bone marrow  . Orthostatic hypotension   . Chronic anticoagulation     . Diverticulosis   . 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 Chronic kidney disease      Creatinine of 1.7 in 8/08, 1.4 in 10/08 and 1.5 in 11/09  . Urethral caruncle   . Iron deficiency anemia secondary to blood loss (chronic) 01/19/2013  . History of rectal bleeding 01/19/2013    Past Surgical History  Procedure Laterality Date  . Cholecystectomy    . Cervical spine surgery    . Lumbar disc surgery    . Bladder suspension    . Abdominal hysterectomy  1972  . Cataract extraction      Bilateral  . Esophagogastroduodenoscopy  01/09/09    Rourk -noncritical Schatzki's ring, small hiatal hernia, fundal gland polyps, bile-stained gastric mucosa, polypoid lesion straddling D1 and D2 1-2 cm, localized lymphangitic-appearing mucosa at D2 biopsied (adenomatous polyp with high grade glandular dysplasia  . A-v cardiac pacemaker insertion  11/2006    [employer]    History   Social History  . Marital Status: Widowed    Spouse Name: N/A    Number of Children: N/A  . Years of Education: N/A   Occupational History  . retired    Social History Main Topics  . Smoking status: Never Smoker   . Smokeless tobacco: Never Used  . Alcohol Use: No  . Drug Use: No  . Sexual Activity: No   Other Topics Concern  . Not on file   Social History Narrative  . No narrative on file     Filed Vitals:   03/20/13 1547  BP: 150/54  Pulse: 68  Height: 5' (1.524 m)  Weight: 146 lb (66.225 kg)  SpO2: 94%    PHYSICAL EXAM General: NAD  Neck: no thyromegaly or thyroid nodule.  Lungs: Clear to auscultation bilaterally with normal respiratory effort.  CV: Nondisplaced PMI. Heart regular S1/S2 heard but slightly diminished, no S3/S4, III/VI crescendo-decrescendo, late-peaking systolic murmur. Trace pedal edema.  Normal pedal pulses.  Abdomen: Soft, nontender, no hepatosplenomegaly, no distention.  Neurologic: Alert  and oriented x 3.  Psych: Normal affect.  Extremities: No clubbing or cyanosis. Venous stasis changes noted on dorsum of feet.   ECG: reviewed and available in electronic records.      ASSESSMENT AND PLAN: 1. Moderate to severe aortic stenosis: will continue to manage medically, as she is not currently a candidate for surgery or TAVR given that her stenosis is moderate (S2 appreciated) with a mean gradient  of 23 mmHg.  2. Chronic diastolic heart failure: I will switch torsemide to 10 mg every other day given her complaints. I recommend she continue wearing compression stockings 15-20 mmHg.  3. Paroxysmal atrial fibrillation: currently in a regular rhythm. Maintains anticoagulation with warfarin.  4. HTN: reasonable for age.  5. Dyspnea: will help to manage with diuretics, as both aortic stenosis and diastolic heart failure are contributing. She may benefit from continued iron therapy, but I will defer to her oncologist.  Dispo: f/u 6 months.   Kate Sable, M.D., F.A.C.C.

## 2013-04-03 ENCOUNTER — Ambulatory Visit (INDEPENDENT_AMBULATORY_CARE_PROVIDER_SITE_OTHER): Payer: Medicare Other | Admitting: *Deleted

## 2013-04-03 DIAGNOSIS — Z7901 Long term (current) use of anticoagulants: Secondary | ICD-10-CM

## 2013-04-03 LAB — POCT INR: INR: 1.9

## 2013-04-24 ENCOUNTER — Ambulatory Visit (INDEPENDENT_AMBULATORY_CARE_PROVIDER_SITE_OTHER): Payer: Medicare Other | Admitting: *Deleted

## 2013-04-24 ENCOUNTER — Encounter (HOSPITAL_COMMUNITY): Payer: Medicare Other | Attending: Hematology and Oncology

## 2013-04-24 DIAGNOSIS — D500 Iron deficiency anemia secondary to blood loss (chronic): Secondary | ICD-10-CM

## 2013-04-24 DIAGNOSIS — I480 Paroxysmal atrial fibrillation: Secondary | ICD-10-CM

## 2013-04-24 DIAGNOSIS — I4891 Unspecified atrial fibrillation: Secondary | ICD-10-CM

## 2013-04-24 DIAGNOSIS — C9110 Chronic lymphocytic leukemia of B-cell type not having achieved remission: Secondary | ICD-10-CM

## 2013-04-24 DIAGNOSIS — Z5181 Encounter for therapeutic drug level monitoring: Secondary | ICD-10-CM

## 2013-04-24 DIAGNOSIS — I495 Sick sinus syndrome: Secondary | ICD-10-CM

## 2013-04-24 DIAGNOSIS — Z7901 Long term (current) use of anticoagulants: Secondary | ICD-10-CM

## 2013-04-24 LAB — CBC WITH DIFFERENTIAL/PLATELET
BASOS ABS: 0.1 10*3/uL (ref 0.0–0.1)
BASOS PCT: 1 % (ref 0–1)
Eosinophils Absolute: 0.3 10*3/uL (ref 0.0–0.7)
Eosinophils Relative: 2 % (ref 0–5)
HEMATOCRIT: 32.1 % — AB (ref 36.0–46.0)
Hemoglobin: 10.3 g/dL — ABNORMAL LOW (ref 12.0–15.0)
Lymphocytes Relative: 51 % — ABNORMAL HIGH (ref 12–46)
Lymphs Abs: 7 10*3/uL — ABNORMAL HIGH (ref 0.7–4.0)
MCH: 32.7 pg (ref 26.0–34.0)
MCHC: 32.1 g/dL (ref 30.0–36.0)
MCV: 101.9 fL — ABNORMAL HIGH (ref 78.0–100.0)
MONO ABS: 0.5 10*3/uL (ref 0.1–1.0)
Monocytes Relative: 4 % (ref 3–12)
NEUTROS PCT: 43 % (ref 43–77)
Neutro Abs: 5.9 10*3/uL (ref 1.7–7.7)
Platelets: 177 10*3/uL (ref 150–400)
RBC: 3.15 MIL/uL — ABNORMAL LOW (ref 3.87–5.11)
RDW: 14 % (ref 11.5–15.5)
WBC: 13.8 10*3/uL — AB (ref 4.0–10.5)

## 2013-04-24 LAB — FERRITIN: FERRITIN: 93 ng/mL (ref 10–291)

## 2013-04-24 LAB — POCT INR: INR: 2.6

## 2013-04-24 NOTE — Progress Notes (Signed)
Labs drawn today for cbc/diff,ferr 

## 2013-04-26 ENCOUNTER — Ambulatory Visit (INDEPENDENT_AMBULATORY_CARE_PROVIDER_SITE_OTHER): Payer: Medicare Other | Admitting: *Deleted

## 2013-04-26 ENCOUNTER — Encounter (INDEPENDENT_AMBULATORY_CARE_PROVIDER_SITE_OTHER): Payer: Self-pay

## 2013-04-26 DIAGNOSIS — I480 Paroxysmal atrial fibrillation: Secondary | ICD-10-CM

## 2013-04-26 DIAGNOSIS — I495 Sick sinus syndrome: Secondary | ICD-10-CM

## 2013-04-26 DIAGNOSIS — I4891 Unspecified atrial fibrillation: Secondary | ICD-10-CM

## 2013-04-26 LAB — MDC_IDC_ENUM_SESS_TYPE_INCLINIC
Battery Remaining Longevity: 24 mo
Battery Voltage: 2.72 V
Brady Statistic AS VP Percent: 35 %
Date Time Interrogation Session: 20150211150620
Lead Channel Pacing Threshold Amplitude: 0.75 V
Lead Channel Pacing Threshold Pulse Width: 0.4 ms
Lead Channel Sensing Intrinsic Amplitude: 11.2 mV
Lead Channel Setting Pacing Pulse Width: 0.4 ms
Lead Channel Setting Sensing Sensitivity: 5.6 mV
MDC IDC MSMT BATTERY IMPEDANCE: 1719 Ohm
MDC IDC MSMT LEADCHNL RA IMPEDANCE VALUE: 333 Ohm
MDC IDC MSMT LEADCHNL RA SENSING INTR AMPL: 0.25 mV
MDC IDC MSMT LEADCHNL RV IMPEDANCE VALUE: 451 Ohm
MDC IDC SET LEADCHNL RA PACING AMPLITUDE: 2 V
MDC IDC SET LEADCHNL RV PACING AMPLITUDE: 2.5 V
MDC IDC STAT BRADY AP VP PERCENT: 24 %
MDC IDC STAT BRADY AP VS PERCENT: 2 %
MDC IDC STAT BRADY AS VS PERCENT: 39 %

## 2013-04-26 NOTE — Progress Notes (Signed)
Pacemaker check in clinic. Normal device function. Thresholds, sensing, impedances consistent with previous measurements. Device programmed to maximize longevity. 100% A-fib, + coumadin. This is an increase since last checked 10/25/12.   No high ventricular rates noted. Device programmed at appropriate safety margins. Histogram distribution appropriate for patient activity level. Device programmed to optimize intrinsic conduction. Estimated longevity 2 years.  Patient education completed.  ROV 6 months with Dr. Lovena Le in [place].

## 2013-04-30 NOTE — Progress Notes (Signed)
-  Rescheduled due to inclement weather-

## 2013-05-01 ENCOUNTER — Ambulatory Visit (HOSPITAL_COMMUNITY): Payer: Medicare Other | Admitting: Oncology

## 2013-05-02 NOTE — Progress Notes (Signed)
[name] Bogus, MD [place] [address]  Chronic lymphocytic leukemia - Plan: CBC with Differential, Comprehensive metabolic panel, Lactate dehydrogenase, Iron and TIBC, Ferritin, CBC with Differential, Comprehensive metabolic panel, Reticulocytes, Lactate dehydrogenase, Sedimentation rate, Beta 2 microglobuline, serum, Erythropoietin, Iron and TIBC, Ferritin  Chronic anticoagulation  Sick sinus syndrome  Aortic stenosis  Congestive heart failure  Chronic kidney disease  Paroxysmal atrial fibrillation  Pacemaker  Anemia, normocytic normochromic  Iron deficiency anemia secondary to blood loss (chronic) - Plan: Iron and TIBC, Ferritin, Iron and TIBC, Ferritin  History of rectal bleeding  CURRENT THERAPY: Observation/Surveillance  INTERVAL HISTORY: [name] 80 y.o. female returns for  regular  visit for followup of iron deficiency secondary to chronic blood lossin the setting of CLL, chronic anticoagulation, and carbuncle of urethra causing GU blood loss.    I personally reviewed and went over laboratory results with the patient.  The results are noted within this dictation.  Chart is reviewed.  Cardiology note from Dr. Kate Sable is noted:  1. Moderate to severe aortic stenosis: will continue to manage medically, as she is not currently a candidate for surgery or TAVR given that her stenosis is moderate (S2 appreciated) with a mean gradient of 23 mmHg.  2. Chronic diastolic heart failure: I will switch torsemide to 10 mg every other day given her complaints. I recommend she continue wearing compression stockings 15-20 mmHg.  3. Paroxysmal atrial fibrillation: currently in a regular rhythm. Maintains anticoagulation with warfarin.  4. HTN: reasonable for age.  5. Dyspnea: will help to manage with diuretics, as both aortic stenosis and diastolic heart failure are contributing. She may benefit from continued iron therapy, but I will defer  to her oncologist.  [name] is doing well but has a few complaints: 1. She reports shortness of breath. She is reported as Dr. Luan Pulling who has placed her on Ventolin. She has an upcoming appointment on the 25th with him I recommended she followup with her primary care physician regarding this.  2. She notes "sores on face, scalp." She showed me the sores which are unimpressive with physical examination. She does have a few areas that are excoriated were she's been scratching. Of note, she does have dry skin of her face. I recommended that she placed moisturizing lotion on her face to help with his dry skin. 3. Bleeding from rectum. Her GI doctor is Dr. Laural Golden and I've encouraged her to followup with him. I suspect she has hemorrhoids versus telangiectasias of rectum.  Both of which can bleed during bowel movements. She notes blood in her toilet bowl in addition to blood on toilet paper. She denies any itching or burning of the anal area. She does note some abdominal discomfort during bowel movements which could be from her irritable bowel syndrome.  I provided her education regarding her laboratory work. We'll continue to monitor her ferritin level. I think a rule of thumb is to provide her IV Feraheme for her ferritin level around 50 or less. This will provide her ample iron stores as she continues to have chronic GI blood loss.  Otherwise, hematologically, she denies any complaints. She denies any B. symptoms. I suspect we'll never need treatment for her CLL in her biggest issue is her cardiac problems. We'll continue to support her blood counts as necessary and follow her along  Past Medical History  Diagnosis Date  . Sick sinus syndrome 11/2006    paroxysmal atrial fibrillation; dual-chamber pacemaker in  11/2006  . Aortic stenosis     mild  . Left bundle branch block   . Congestive heart failure     Congestive heart failure with normal ejection fraction  . Arteriosclerotic cardiovascular  disease (ASCVD)     coronary angio in 10/09:50% left anterior descending; 80% PDA; medical therapy advisedOrthostatic decrease in blood pressure  . Epistaxis     mild with negative ENT evaluation  . Dizziness     chronic  . Osteoarthritis   . Irritable bowel syndrome   . Urinary frequency     with incontinence  . Gastroesophageal reflux disease     Esophageal dilatation for stricture in 09/2011  . Rosacea   . Chronic lymphocytic leukemia     Stage I with anemia H./H. of 9/27 in 11/09 with normal MCV; iron deficiency in bone marrow  . Orthostatic hypotension   . Chronic anticoagulation   . Diverticulosis   . 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 Chronic kidney disease      Creatinine of 1.7 in 8/08, 1.4 in 10/08 and 1.5 in 11/09  . Urethral caruncle   . Iron deficiency anemia secondary to blood loss (chronic) 01/19/2013  . History of rectal bleeding 01/19/2013    has HYPERLIPIDEMIA; HYPERTENSION; Arteriosclerotic cardiovascular disease (ASCVD); Chronic anticoagulation; Sick sinus syndrome; Aortic stenosis; Congestive heart failure; Chronic kidney disease; Gastroesophageal reflux disease; Chronic lymphocytic leukemia; Transient blindness; Paroxysmal atrial fibrillation; Pacemaker; Hematuria; Anemia, normocytic normochromic; Fasting hyperglycemia; Iron deficiency anemia secondary to blood loss (chronic); History of rectal bleeding; and Encounter for therapeutic drug monitoring on her problem list.     is allergic to vesicare; detrol; sanctura; sulfa antibiotics; sulfonamide derivatives; and trimethoprim.  [name] does not currently have medications on file.  Past Surgical History  Procedure Laterality Date  . Cholecystectomy    . Cervical spine surgery    . Lumbar disc surgery    . Bladder suspension    . Abdominal hysterectomy  1972  . Cataract extraction      Bilateral  .  Esophagogastroduodenoscopy  01/09/09    [facility] -noncritical Schatzki's ring, small hiatal hernia, fundal gland polyps, bile-stained gastric mucosa, polypoid lesion straddling D1 and D2 1-2 cm, localized lymphangitic-appearing mucosa at D2 biopsied (adenomatous polyp with high grade glandular dysplasia  . A-v cardiac pacemaker insertion  11/2006    [employer]    Denies any headaches, dizziness, double vision, fevers, chills, night sweats, nausea, vomiting, diarrhea, constipation, chest pain, heart palpitations, shortness of breath, blood in stool, black tarry stool, urinary pain, urinary burning, urinary frequency, hematuria.   PHYSICAL EXAMINATION  ECOG PERFORMANCE STATUS: 2 - Symptomatic, <50% confined to bed  Filed Vitals:   05/05/13 1425  BP: 136/72  Pulse: 92  Temp: 97.3 F (36.3 C)  Resp: 24    GENERAL:alert, healthy, no distress, well nourished, well developed, comfortable, cooperative and smiling SKIN: skin color, texture, turgor are normal, no rashes or significant lesions, dry skin of face HEAD: Normocephalic, No masses, lesions, tenderness or abnormalities EYES: normal, PERRLA, EOMI, Conjunctiva are pink and non-injected EARS: External ears normal OROPHARYNX:lips, buccal mucosa, and tongue normal and mucous membranes are moist  NECK: supple, no adenopathy, thyroid normal size, non-tender, without nodularity, no stridor, non-tender, trachea midline LYMPH:  Not examined BREAST:not examined LUNGS: clear to auscultation  HEART: regular rate & rhythm and systolic ejection murmur heard throughout the precordium ABDOMEN:abdomen soft, non-tender and normal  bowel sounds BACK: Back symmetric, no curvature. EXTREMITIES:less then 2 second capillary refill, no joint deformities, effusion, or inflammation, no skin discoloration, no clubbing, no cyanosis  NEURO: alert & oriented x 3 with fluent speech, no focal motor/sensory deficits, gait normal   LABORATORY DATA: CBC    Component  Value Date/Time   WBC 13.8* 04/24/2013 1026   RBC 3.15* 04/24/2013 1026   RBC 3.34* 03/20/2008 1411   HGB 10.3* 04/24/2013 1026   HCT 32.1* 04/24/2013 1026   PLT 177 04/24/2013 1026   MCV 101.9* 04/24/2013 1026   MCH 32.7 04/24/2013 1026   MCHC 32.1 04/24/2013 1026   RDW 14.0 04/24/2013 1026   LYMPHSABS 7.0* 04/24/2013 1026   MONOABS 0.5 04/24/2013 1026   EOSABS 0.3 04/24/2013 1026   BASOSABS 0.1 04/24/2013 1026    Lab Results  Component Value Date   FERRITIN 93 04/24/2013     ASSESSMENT:  1. CLL, stable and no indication for treatment at this time.  She may be a candidate for Imbruvica if needed in the future. 2. Mixed anemia and multifactorial with a definite element of iron deficiency requiring IV Feraheme.  A. CLL  B. Chronic blood loss  C. Aortic stenosis, moderate-severe, causing turbulent blood flow  D. Chronic anticoagulation  E. Chronic renal disease  F. CHF  G. Hematuria  H. Sick Sinus Syndrome 3. Atrial fibrillation, on chronic anticoagulation, with left bundle branch block, S/P dula-chamber pacemaker in Sept 2008. 4. Status post resection of skin cancers, basal cell found on the right shoulder. 5. Iron deficiency anemia secondary to Chronic GI blood loss. 6. CI blood loss, secondary to telangiectasias from severe aortic stenosis.   Patient Active Problem List   Diagnosis Date Noted  . Encounter for therapeutic drug monitoring 04/24/2013  . Iron deficiency anemia secondary to blood loss (chronic) 01/19/2013  . History of rectal bleeding 01/19/2013  . Anemia, normocytic normochromic 10/23/2012  . Fasting hyperglycemia 10/23/2012  . Hematuria 10/13/2012  . Pacemaker 10/15/2011  . Transient blindness 09/14/2011  . Paroxysmal atrial fibrillation 09/14/2011  . Aortic stenosis   . Congestive heart failure   . Chronic kidney disease   . Gastroesophageal reflux disease   . Chronic lymphocytic leukemia   . Chronic anticoagulation 06/10/2010  . Arteriosclerotic cardiovascular disease  (ASCVD) 03/22/2009  . HYPERLIPIDEMIA 12/17/2008  . HYPERTENSION 12/17/2008  . Sick sinus syndrome 11/15/2006    PLAN:  1. I personally reviewed and went over laboratory results with the patient.  The results are noted within this dictation. 2. Labs in 6 weeks and 12 weeks: CBC diff, Iron/TIBC, Ferritin 3. Labs in 12 weeks: LDH, ESR, CMET, Epo level, B2M 4. Patient education regarding her blood work, particularly her ferritin level. 5. Patient education regarding CLL 6. Patient education regarding hemorrhoids and telangiectasias. 7. Recommend follow-up with Dr. Luan Pulling as scheduled on 2/25. 8. Recommend follow-up with Dr. Laural Golden for rectal bleeding.  9. Return in 12 weeks for follow-up    THERAPY PLAN:  From a CLL standpoint, she does not meet clear-cut evidence for treatment at this time.  These indications follow below.  We will continue to monitor her counts and support her counts as needed with IV feraheme +/- ESA therapy.  NCCN guidelines for indication for treatment of CLL are:  A. Eligible for clinical trial  B. Significant disease-related symptoms   1. Fatigue (severe)   2. Night sweats   3. Weight loss   4. Fever without infection  C. Threatened end-organ function  D. Progressive bulky disease (spleen >6cm below costal margin, lymph nodes >10 cm)  E. Progressive anemia  F. Progressive thrombocytopenia.  All questions were answered. The patient knows to call the clinic with any problems, questions or concerns. We can certainly see the patient much sooner if necessary.  Patient and plan discussed with Dr. Farrel Gobble and he is in agreement with the aforementioned.   Gurshaan Matsuoka

## 2013-05-05 ENCOUNTER — Encounter (HOSPITAL_BASED_OUTPATIENT_CLINIC_OR_DEPARTMENT_OTHER): Payer: Medicare Other | Admitting: Oncology

## 2013-05-05 VITALS — BP 136/72 | HR 92 | Temp 97.3°F | Resp 24 | Wt 150.0 lb

## 2013-05-05 DIAGNOSIS — R0602 Shortness of breath: Secondary | ICD-10-CM

## 2013-05-05 DIAGNOSIS — I480 Paroxysmal atrial fibrillation: Secondary | ICD-10-CM

## 2013-05-05 DIAGNOSIS — N189 Chronic kidney disease, unspecified: Secondary | ICD-10-CM

## 2013-05-05 DIAGNOSIS — Z8719 Personal history of other diseases of the digestive system: Secondary | ICD-10-CM

## 2013-05-05 DIAGNOSIS — I495 Sick sinus syndrome: Secondary | ICD-10-CM

## 2013-05-05 DIAGNOSIS — I4891 Unspecified atrial fibrillation: Secondary | ICD-10-CM

## 2013-05-05 DIAGNOSIS — I509 Heart failure, unspecified: Secondary | ICD-10-CM

## 2013-05-05 DIAGNOSIS — D500 Iron deficiency anemia secondary to blood loss (chronic): Secondary | ICD-10-CM

## 2013-05-05 DIAGNOSIS — C9110 Chronic lymphocytic leukemia of B-cell type not having achieved remission: Secondary | ICD-10-CM

## 2013-05-05 DIAGNOSIS — Z7901 Long term (current) use of anticoagulants: Secondary | ICD-10-CM

## 2013-05-05 DIAGNOSIS — Z950 Presence of cardiac pacemaker: Secondary | ICD-10-CM

## 2013-05-05 DIAGNOSIS — I359 Nonrheumatic aortic valve disorder, unspecified: Secondary | ICD-10-CM

## 2013-05-05 DIAGNOSIS — K625 Hemorrhage of anus and rectum: Secondary | ICD-10-CM

## 2013-05-05 DIAGNOSIS — I350 Nonrheumatic aortic (valve) stenosis: Secondary | ICD-10-CM

## 2013-05-05 DIAGNOSIS — D649 Anemia, unspecified: Secondary | ICD-10-CM

## 2013-05-05 DIAGNOSIS — I789 Disease of capillaries, unspecified: Secondary | ICD-10-CM

## 2013-05-05 NOTE — Patient Instructions (Signed)
[facility] Discharge Instructions  RECOMMENDATIONS MADE BY THE CONSULTANT AND ANY TEST RESULTS WILL BE SENT TO YOUR REFERRING PHYSICIAN.  EXAM FINDINGS BY THE PHYSICIAN TODAY AND SIGNS OR SYMPTOMS TO REPORT TO CLINIC OR PRIMARY PHYSICIAN: Exam and findings as discussed by [name], PA-C.  Labs are stable.  Follow-up with Dr. Laural Golden regarding the rectal bleeding and Dr. Luan Pulling about your breathing issues.  MEDICATIONS PRESCRIBED:  none  INSTRUCTIONS/FOLLOW-UP: Labs in 6 weeks and 12 weeks and office visit in 12 weeks.  Thank you for choosing [facility] to provide your oncology and hematology care.  To afford each patient quality time with our providers, please arrive at least 15 minutes before your scheduled appointment time.  With your help, our goal is to use those 15 minutes to complete the necessary work-up to ensure our physicians have the information they need to help with your evaluation and healthcare recommendations.    Effective January 1st, 2014, we ask that you re-schedule your appointment with our physicians should you arrive 10 or more minutes late for your appointment.  We strive to give you quality time with our providers, and arriving late affects you and other patients whose appointments are after yours.    Again, thank you for choosing [facility].  Our hope is that these requests will decrease the amount of time that you wait before being seen by our physicians.       _____________________________________________________________  Should you have questions after your visit to [facility], please contact our office at [phone] between the hours of 8:30 a.m. and 5:00 p.m.  Voicemails left after 4:30 p.m. will not be returned until the following business day.  For prescription refill requests, have your pharmacy contact our office with your prescription refill request.

## 2013-05-17 ENCOUNTER — Encounter: Payer: Self-pay | Admitting: Internal Medicine

## 2013-05-31 ENCOUNTER — Ambulatory Visit (INDEPENDENT_AMBULATORY_CARE_PROVIDER_SITE_OTHER): Payer: Medicare Other | Admitting: *Deleted

## 2013-05-31 DIAGNOSIS — I495 Sick sinus syndrome: Secondary | ICD-10-CM

## 2013-05-31 DIAGNOSIS — I480 Paroxysmal atrial fibrillation: Secondary | ICD-10-CM

## 2013-05-31 DIAGNOSIS — Z7901 Long term (current) use of anticoagulants: Secondary | ICD-10-CM

## 2013-05-31 DIAGNOSIS — I4891 Unspecified atrial fibrillation: Secondary | ICD-10-CM

## 2013-05-31 DIAGNOSIS — Z5181 Encounter for therapeutic drug level monitoring: Secondary | ICD-10-CM

## 2013-05-31 LAB — POCT INR: INR: 2.2

## 2013-06-05 ENCOUNTER — Encounter (HOSPITAL_COMMUNITY): Payer: Medicare Other

## 2013-06-09 ENCOUNTER — Ambulatory Visit (HOSPITAL_COMMUNITY): Payer: Medicare Other | Admitting: Oncology

## 2013-06-15 ENCOUNTER — Encounter (HOSPITAL_COMMUNITY): Payer: Medicare Other

## 2013-06-19 ENCOUNTER — Encounter (HOSPITAL_COMMUNITY): Payer: Medicare Other | Attending: Hematology and Oncology

## 2013-06-19 DIAGNOSIS — C9110 Chronic lymphocytic leukemia of B-cell type not having achieved remission: Secondary | ICD-10-CM | POA: Insufficient documentation

## 2013-06-19 DIAGNOSIS — D500 Iron deficiency anemia secondary to blood loss (chronic): Secondary | ICD-10-CM | POA: Insufficient documentation

## 2013-06-19 LAB — CBC WITH DIFFERENTIAL/PLATELET
BASOS ABS: 0.1 10*3/uL (ref 0.0–0.1)
BASOS PCT: 0 % (ref 0–1)
EOS ABS: 0.3 10*3/uL (ref 0.0–0.7)
Eosinophils Relative: 2 % (ref 0–5)
HCT: 30.5 % — ABNORMAL LOW (ref 36.0–46.0)
Hemoglobin: 9.6 g/dL — ABNORMAL LOW (ref 12.0–15.0)
LYMPHS ABS: 6.9 10*3/uL — AB (ref 0.7–4.0)
Lymphocytes Relative: 52 % — ABNORMAL HIGH (ref 12–46)
MCH: 31.9 pg (ref 26.0–34.0)
MCHC: 31.5 g/dL (ref 30.0–36.0)
MCV: 101.3 fL — ABNORMAL HIGH (ref 78.0–100.0)
Monocytes Absolute: 0.6 10*3/uL (ref 0.1–1.0)
Monocytes Relative: 4 % (ref 3–12)
NEUTROS PCT: 42 % — AB (ref 43–77)
Neutro Abs: 5.5 10*3/uL (ref 1.7–7.7)
PLATELETS: 204 10*3/uL (ref 150–400)
RBC: 3.01 MIL/uL — AB (ref 3.87–5.11)
RDW: 14.4 % (ref 11.5–15.5)
WBC: 13.3 10*3/uL — ABNORMAL HIGH (ref 4.0–10.5)

## 2013-06-19 LAB — COMPREHENSIVE METABOLIC PANEL
ALT: 8 U/L (ref 0–35)
AST: 18 U/L (ref 0–37)
Albumin: 3.7 g/dL (ref 3.5–5.2)
Alkaline Phosphatase: 111 U/L (ref 39–117)
BILIRUBIN TOTAL: 0.5 mg/dL (ref 0.3–1.2)
BUN: 68 mg/dL — AB (ref 6–23)
CHLORIDE: 106 meq/L (ref 96–112)
CO2: 25 meq/L (ref 19–32)
Calcium: 9.3 mg/dL (ref 8.4–10.5)
Creatinine, Ser: 1.76 mg/dL — ABNORMAL HIGH (ref 0.50–1.10)
GFR calc Af Amer: 27 mL/min — ABNORMAL LOW (ref >90)
GFR, EST NON AFRICAN AMERICAN: 23 mL/min — AB (ref >90)
Glucose, Bld: 138 mg/dL — ABNORMAL HIGH (ref 70–99)
Potassium: 4.9 mEq/L (ref 3.7–5.3)
Sodium: 144 mEq/L (ref 137–147)
Total Protein: 7.2 g/dL (ref 6.0–8.3)

## 2013-06-19 LAB — IRON AND TIBC
Iron: 56 ug/dL (ref 42–135)
SATURATION RATIOS: 16 % — AB (ref 20–55)
TIBC: 350 ug/dL (ref 250–470)
UIBC: 294 ug/dL (ref 125–400)

## 2013-06-19 LAB — FERRITIN: Ferritin: 76 ng/mL (ref 10–291)

## 2013-06-19 LAB — LACTATE DEHYDROGENASE: LDH: 129 U/L (ref 94–250)

## 2013-06-19 NOTE — Progress Notes (Signed)
Labs drawn today for cbc/diff,cmp,ferr,Iron and IBC,ldh

## 2013-06-23 ENCOUNTER — Encounter (HOSPITAL_COMMUNITY): Payer: Medicare Other

## 2013-06-27 ENCOUNTER — Other Ambulatory Visit: Payer: Self-pay | Admitting: Cardiology

## 2013-06-28 ENCOUNTER — Ambulatory Visit (INDEPENDENT_AMBULATORY_CARE_PROVIDER_SITE_OTHER): Payer: Medicare Other | Admitting: *Deleted

## 2013-06-28 DIAGNOSIS — I480 Paroxysmal atrial fibrillation: Secondary | ICD-10-CM

## 2013-06-28 DIAGNOSIS — I495 Sick sinus syndrome: Secondary | ICD-10-CM

## 2013-06-28 DIAGNOSIS — Z5181 Encounter for therapeutic drug level monitoring: Secondary | ICD-10-CM

## 2013-06-28 DIAGNOSIS — Z7901 Long term (current) use of anticoagulants: Secondary | ICD-10-CM

## 2013-06-28 DIAGNOSIS — I4891 Unspecified atrial fibrillation: Secondary | ICD-10-CM

## 2013-06-28 LAB — POCT INR: INR: 2.4

## 2013-07-24 ENCOUNTER — Ambulatory Visit (INDEPENDENT_AMBULATORY_CARE_PROVIDER_SITE_OTHER): Payer: Medicare Other | Admitting: *Deleted

## 2013-07-24 DIAGNOSIS — I480 Paroxysmal atrial fibrillation: Secondary | ICD-10-CM

## 2013-07-24 DIAGNOSIS — Z5181 Encounter for therapeutic drug level monitoring: Secondary | ICD-10-CM

## 2013-07-24 DIAGNOSIS — Z7901 Long term (current) use of anticoagulants: Secondary | ICD-10-CM

## 2013-07-24 DIAGNOSIS — I495 Sick sinus syndrome: Secondary | ICD-10-CM

## 2013-07-24 DIAGNOSIS — I4891 Unspecified atrial fibrillation: Secondary | ICD-10-CM

## 2013-07-24 LAB — POCT INR: INR: 2.1

## 2013-07-25 NOTE — Progress Notes (Signed)
[name] Bogus, MD [place] [address]  Chronic lymphocytic leukemia - Plan: CBC with Differential, Comprehensive metabolic panel, Lactate dehydrogenase, Sedimentation rate, Beta 2 microglobuline, serum, CBC with Differential, Comprehensive metabolic panel, Reticulocytes, Lactate dehydrogenase, Sedimentation rate, Beta 2 microglobuline, serum, Erythropoietin, Iron and TIBC, Ferritin  Sick sinus syndrome  Congestive heart failure  Chronic kidney disease  Anemia, normocytic normochromic - Plan: Soluble transferrin receptor  Iron deficiency anemia secondary to blood loss (chronic) - Plan: CBC with Differential, Iron and TIBC, Ferritin, Iron and TIBC, Ferritin, Soluble transferrin receptor  CURRENT THERAPY:Observation/Surveillance   INTERVAL HISTORY: [name] 80 y.o. female returns for  regular  visit for followup of iron deficiency secondary to chronic blood lossin the setting of CLL, chronic anticoagulation, and carbuncle of urethra causing GU blood loss.   I personally reviewed and went over laboratory results with the patient.  The results are noted within this dictation.  I think a rule of thumb is to provide her IV Feraheme for her ferritin level around 50 or less. This will provide her ample iron stores as she continues to have chronic GI blood loss.  She has a list of complaints and I will list them numerically: 1. Shortness of breath- this is at baseline when further questioned.  She denies any acute exacerbation.   2. Fatigue- this too is at baseline.  This is likely mutifactorial from her cardiac issue(s) and CLL 3. Thirsty- I do not have a good explanation from this and I encouraged her to follow-up with Dr. Luan Pulling next week as scheduled. Her glucose has been fair of late when it has been checked by [place]. 4. Incomplete bladder emptying- She has tried medications for this by her PCP without success. 5. Bleeding:  A. Epistaxis- She has  a sore in her right nare that is causing her to pick at it and this is likely the source of epistaxis.  I recommended neosporin/triple antibiotic ointment  B. Rectal- she notes this on toilet paper and sometimes in the toilet bowel.  She admits to a positive history of hemorrhoids. I suspect this is hemorrhoid-induced and recommended OTC Preparation H  C. Urethra- in remission 6. CHF- Followed by cardiology 7. IBS- favoring the loose stool side of this issue.  She notes loose stool every evening, therefore if she has hemorrhoids, it is not from increased straining/constipation. 8. Fragile nails- she has parallel, palpable ridges of fingernail, with darkening of the lunula.  She actually lost on her nails when she was "working on my cuticles."  Concerns for this include iron deficiency and thyroid issues. We have ordered labs for iron deficiency.  I have touched upon all of her issues mentioned above.  She is happy with everything we discussed. She is not interested in CLL treatment at this time which is appropriate.   Hematologically, she otherwise denies any complaints.    Past Medical History  Diagnosis Date  . Sick sinus syndrome 11/2006    paroxysmal atrial fibrillation; dual-chamber pacemaker in 11/2006  . Aortic stenosis     mild  . Left bundle branch block   . Congestive heart failure     Congestive heart failure with normal ejection fraction  . Arteriosclerotic cardiovascular disease (ASCVD)     coronary angio in 10/09:50% left anterior descending; 80% PDA; medical therapy advisedOrthostatic decrease in blood pressure  . Epistaxis     mild with negative ENT evaluation  . Dizziness  chronic  . Osteoarthritis   . Irritable bowel syndrome   . Urinary frequency     with incontinence  . Gastroesophageal reflux disease     Esophageal dilatation for stricture in 09/2011  . Rosacea   . Chronic lymphocytic leukemia     Stage I with anemia H./H. of 9/27 in 11/09 with normal MCV; iron  deficiency in bone marrow  . Orthostatic hypotension   . Chronic anticoagulation   . Diverticulosis   . 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 Chronic kidney disease      Creatinine of 1.7 in 8/08, 1.4 in 10/08 and 1.5 in 11/09  . Urethral caruncle   . Iron deficiency anemia secondary to blood loss (chronic) 01/19/2013  . History of rectal bleeding 01/19/2013    has HYPERLIPIDEMIA; HYPERTENSION; Arteriosclerotic cardiovascular disease (ASCVD); Chronic anticoagulation; Sick sinus syndrome; Aortic stenosis; Congestive heart failure; Chronic kidney disease; Gastroesophageal reflux disease; Chronic lymphocytic leukemia; Transient blindness; Paroxysmal atrial fibrillation; Pacemaker; Hematuria; Anemia, normocytic normochromic; Fasting hyperglycemia; Iron deficiency anemia secondary to blood loss (chronic); History of rectal bleeding; and Encounter for therapeutic drug monitoring on her problem list.     is allergic to vesicare; detrol; sanctura; sulfa antibiotics; sulfonamide derivatives; and trimethoprim.  [name] does not currently have medications on file.  Past Surgical History  Procedure Laterality Date  . Cholecystectomy    . Cervical spine surgery    . Lumbar disc surgery    . Bladder suspension    . Abdominal hysterectomy  1972  . Cataract extraction      Bilateral  . Esophagogastroduodenoscopy  01/09/09    Rourk -noncritical Schatzki's ring, small hiatal hernia, fundal gland polyps, bile-stained gastric mucosa, polypoid lesion straddling D1 and D2 1-2 cm, localized lymphangitic-appearing mucosa at D2 biopsied (adenomatous polyp with high grade glandular dysplasia  . A-v cardiac pacemaker insertion  11/2006    [employer]    Denies any headaches, dizziness, double vision, fevers, chills, night sweats, nausea, vomiting, diarrhea, constipation, chest pain, heart palpitations, shortness of breath, blood  in stool, black tarry stool, urinary pain, urinary burning, urinary frequency, hematuria.   PHYSICAL EXAMINATION  ECOG PERFORMANCE STATUS: 2 - Symptomatic, <50% confined to bed  Filed Vitals:   07/27/13 1100  BP: 111/62  Pulse: 64  Temp: 97.5 F (36.4 C)  Resp: 20    GENERAL:alert, no distress, well nourished, well developed, comfortable, cooperative and smiling SKIN: skin color, texture, turgor are normal, no rashes or significant lesions HEAD: Normocephalic, No masses, lesions, tenderness or abnormalities EYES: normal, PERRLA, EOMI, Conjunctiva are pink and non-injected EARS: External ears normal OROPHARYNX:mucous membranes are moist  NECK: trachea midline LYMPH:  not examined BREAST:not examined LUNGS: not examined HEART: not examined ABDOMEN:not examined BACK: Back symmetric, no curvature. EXTREMITIES:less then 2 second capillary refill, no edema, no skin discoloration, no clubbing, no cyanosis, positive findings:  Parallel, palpable ridges of fingernails with darkening of lunula  NEURO: alert & oriented x 3 with fluent speech, no focal motor/sensory deficits, in wheelchair    LABORATORY DATA: CBC    Component Value Date/Time   WBC 12.2* 07/27/2013 1035   RBC 3.09* 07/27/2013 1035   RBC 3.09* 07/27/2013 1035   HGB 9.8* 07/27/2013 1035   HCT 31.1* 07/27/2013 1035   PLT 217 07/27/2013 1035   MCV 100.6* 07/27/2013 1035   MCH 31.7 07/27/2013 1035   MCHC 31.5 07/27/2013 1035   RDW  14.3 07/27/2013 1035   LYMPHSABS 5.1* 07/27/2013 1035   MONOABS 0.6 07/27/2013 1035   EOSABS 0.3 07/27/2013 1035   BASOSABS 0.1 07/27/2013 1035      Chemistry      Component Value Date/Time   NA 143 07/27/2013 1035   K 4.7 07/27/2013 1035   CL 104 07/27/2013 1035   CO2 25 07/27/2013 1035   BUN 51* 07/27/2013 1035   CREATININE 1.82* 07/27/2013 1035   CREATININE 1.72* 12/12/2012 1654      Component Value Date/Time   CALCIUM 9.4 07/27/2013 1035   ALKPHOS 112 07/27/2013 1035   AST 17 07/27/2013 1035    ALT 5 07/27/2013 1035   BILITOT 0.5 07/27/2013 1035        ASSESSMENT:  1. CLL, stable and no indication for treatment at this time. She may be a candidate for Imbruvica if needed in the future.  2. Mixed anemia and multifactorial with a definite element of iron deficiency requiring IV Feraheme.   A. CLL   B. Chronic blood loss   C. Aortic stenosis, moderate-severe, causing turbulent blood flow   D. Chronic anticoagulation   E. Chronic renal disease   F. CHF   G. Hematuria   H. Sick Sinus Syndrome  3. Atrial fibrillation, on chronic anticoagulation, with left bundle branch block, S/P dula-chamber pacemaker in Sept 2008.  4. Status post resection of skin cancers, basal cell found on the right shoulder.  5. Iron deficiency anemia secondary to Chronic GI blood loss.  6. GI blood loss, secondary to telangiectasias from severe aortic stenosis. 7. Parallel palpable ridges of fingernails, will check iron studies today, other differential diagnosis is thyroid issues.  Patient Active Problem List   Diagnosis Date Noted  . Encounter for therapeutic drug monitoring 04/24/2013  . Iron deficiency anemia secondary to blood loss (chronic) 01/19/2013  . History of rectal bleeding 01/19/2013  . Anemia, normocytic normochromic 10/23/2012  . Fasting hyperglycemia 10/23/2012  . Hematuria 10/13/2012  . Pacemaker 10/15/2011  . Transient blindness 09/14/2011  . Paroxysmal atrial fibrillation 09/14/2011  . Aortic stenosis   . Congestive heart failure   . Chronic kidney disease   . Gastroesophageal reflux disease   . Chronic lymphocytic leukemia   . Chronic anticoagulation 06/10/2010  . Arteriosclerotic cardiovascular disease (ASCVD) 03/22/2009  . HYPERLIPIDEMIA 12/17/2008  . HYPERTENSION 12/17/2008  . Sick sinus syndrome 11/15/2006     PLAN:  1. I personally reviewed and went over laboratory results with the patient.  The results are noted within this dictation. 2. Labs in 12 weeks: CBC diff,  Iron/TIBC, Ferritin, LDH, ESR, CMET, B2M, soluble transferrin level 3. Recommend neosporin/triple antibiotic ointment to sore in nose. 4. Recommend follow-up with PCP as directed 5. Recommend preparation H for hemorrhoids 6. If iron studies are negative, TSH is warranted given nail changes. 7. Follow-up with cardiology as directed. 8. Return in 12 weeks for follow-up   THERAPY PLAN:  From a CLL standpoint, she does not meet clear-cut evidence for treatment at this time. These indications follow below. We will continue to monitor her counts and support her counts as needed with IV feraheme +/- ESA therapy.  NCCN guidelines for indication for treatment of CLL are:  A. Eligible for clinical trial  B. Significant disease-related symptoms   1. Fatigue (severe)   2. Night sweats   3. Weight loss   4. Fever without infection  C. Threatened end-organ function  D. Progressive bulky disease (spleen >6cm below costal margin, lymph  nodes >10 cm)  E. Progressive anemia  F. Progressive thrombocytopenia.   All questions were answered. The patient knows to call the clinic with any problems, questions or concerns. We can certainly see the patient much sooner if necessary.  Patient and plan discussed with Dr. Farrel Gobble and he is in agreement with the aforementioned.   [facility] 07/27/2013

## 2013-07-27 ENCOUNTER — Ambulatory Visit (HOSPITAL_COMMUNITY): Payer: Medicare Other | Admitting: Oncology

## 2013-07-27 ENCOUNTER — Encounter (HOSPITAL_COMMUNITY): Payer: Medicare Other

## 2013-07-27 ENCOUNTER — Encounter (HOSPITAL_COMMUNITY): Payer: Self-pay | Admitting: Oncology

## 2013-07-27 ENCOUNTER — Encounter (HOSPITAL_COMMUNITY): Payer: Medicare Other | Attending: Hematology and Oncology | Admitting: Oncology

## 2013-07-27 VITALS — BP 111/62 | HR 64 | Temp 97.5°F | Resp 20 | Wt 145.1 lb

## 2013-07-27 DIAGNOSIS — D696 Thrombocytopenia, unspecified: Secondary | ICD-10-CM

## 2013-07-27 DIAGNOSIS — D500 Iron deficiency anemia secondary to blood loss (chronic): Secondary | ICD-10-CM

## 2013-07-27 DIAGNOSIS — R0602 Shortness of breath: Secondary | ICD-10-CM

## 2013-07-27 DIAGNOSIS — D509 Iron deficiency anemia, unspecified: Secondary | ICD-10-CM

## 2013-07-27 DIAGNOSIS — R5383 Other fatigue: Secondary | ICD-10-CM

## 2013-07-27 DIAGNOSIS — R5381 Other malaise: Secondary | ICD-10-CM

## 2013-07-27 DIAGNOSIS — C9110 Chronic lymphocytic leukemia of B-cell type not having achieved remission: Secondary | ICD-10-CM

## 2013-07-27 DIAGNOSIS — D649 Anemia, unspecified: Secondary | ICD-10-CM

## 2013-07-27 DIAGNOSIS — I4891 Unspecified atrial fibrillation: Secondary | ICD-10-CM

## 2013-07-27 DIAGNOSIS — N189 Chronic kidney disease, unspecified: Secondary | ICD-10-CM

## 2013-07-27 DIAGNOSIS — I495 Sick sinus syndrome: Secondary | ICD-10-CM

## 2013-07-27 DIAGNOSIS — I789 Disease of capillaries, unspecified: Secondary | ICD-10-CM

## 2013-07-27 DIAGNOSIS — I509 Heart failure, unspecified: Secondary | ICD-10-CM

## 2013-07-27 LAB — IRON AND TIBC
IRON: 37 ug/dL — AB (ref 42–135)
Saturation Ratios: 11 % — ABNORMAL LOW (ref 20–55)
TIBC: 337 ug/dL (ref 250–470)
UIBC: 300 ug/dL (ref 125–400)

## 2013-07-27 LAB — CBC WITH DIFFERENTIAL/PLATELET
Basophils Absolute: 0.1 10*3/uL (ref 0.0–0.1)
Basophils Relative: 1 % (ref 0–1)
EOS ABS: 0.3 10*3/uL (ref 0.0–0.7)
Eosinophils Relative: 2 % (ref 0–5)
HCT: 31.1 % — ABNORMAL LOW (ref 36.0–46.0)
Hemoglobin: 9.8 g/dL — ABNORMAL LOW (ref 12.0–15.0)
LYMPHS PCT: 41 % (ref 12–46)
Lymphs Abs: 5.1 10*3/uL — ABNORMAL HIGH (ref 0.7–4.0)
MCH: 31.7 pg (ref 26.0–34.0)
MCHC: 31.5 g/dL (ref 30.0–36.0)
MCV: 100.6 fL — ABNORMAL HIGH (ref 78.0–100.0)
Monocytes Absolute: 0.6 10*3/uL (ref 0.1–1.0)
Monocytes Relative: 5 % (ref 3–12)
NEUTROS PCT: 51 % (ref 43–77)
Neutro Abs: 6.2 10*3/uL (ref 1.7–7.7)
PLATELETS: 217 10*3/uL (ref 150–400)
RBC: 3.09 MIL/uL — AB (ref 3.87–5.11)
RDW: 14.3 % (ref 11.5–15.5)
WBC: 12.2 10*3/uL — ABNORMAL HIGH (ref 4.0–10.5)

## 2013-07-27 LAB — COMPREHENSIVE METABOLIC PANEL
ALK PHOS: 112 U/L (ref 39–117)
ALT: 5 U/L (ref 0–35)
AST: 17 U/L (ref 0–37)
Albumin: 3.6 g/dL (ref 3.5–5.2)
BUN: 51 mg/dL — ABNORMAL HIGH (ref 6–23)
CALCIUM: 9.4 mg/dL (ref 8.4–10.5)
CO2: 25 meq/L (ref 19–32)
Chloride: 104 mEq/L (ref 96–112)
Creatinine, Ser: 1.82 mg/dL — ABNORMAL HIGH (ref 0.50–1.10)
GFR calc non Af Amer: 22 mL/min — ABNORMAL LOW (ref >90)
GFR, EST AFRICAN AMERICAN: 26 mL/min — AB (ref >90)
Glucose, Bld: 136 mg/dL — ABNORMAL HIGH (ref 70–99)
Potassium: 4.7 mEq/L (ref 3.7–5.3)
SODIUM: 143 meq/L (ref 137–147)
Total Bilirubin: 0.5 mg/dL (ref 0.3–1.2)
Total Protein: 6.9 g/dL (ref 6.0–8.3)

## 2013-07-27 LAB — RETICULOCYTES
RBC.: 3.09 MIL/uL — ABNORMAL LOW (ref 3.87–5.11)
RETIC COUNT ABSOLUTE: 27.8 10*3/uL (ref 19.0–186.0)
Retic Ct Pct: 0.9 % (ref 0.4–3.1)

## 2013-07-27 LAB — FERRITIN: FERRITIN: 76 ng/mL (ref 10–291)

## 2013-07-27 LAB — SEDIMENTATION RATE: Sed Rate: 15 mm/hr (ref 0–22)

## 2013-07-27 LAB — LACTATE DEHYDROGENASE: LDH: 150 U/L (ref 94–250)

## 2013-07-27 NOTE — Patient Instructions (Signed)
[facility] Discharge Instructions  RECOMMENDATIONS MADE BY THE CONSULTANT AND ANY TEST RESULTS WILL BE SENT TO YOUR REFERRING PHYSICIAN.  EXAM FINDINGS BY THE PHYSICIAN TODAY AND SIGNS OR SYMPTOMS TO REPORT TO CLINIC OR PRIMARY PHYSICIAN: Exam and findings as discussed by [name], PA-C.  Your fatigue and shortness of breath can be related to your heart issues and your CLL.  Keep your appointment with Dr. Luan Pulling.  For the sore in your nose you can use triple antibiotic.  For your hemorrhoids you can use over the counter Preparation H. If your ferritin level is low we will get you back for an iron infusion.  MEDICATIONS PRESCRIBED:  none  INSTRUCTIONS/FOLLOW-UP: Follow-up with labs and office visit in 3 months.  Thank you for choosing [facility] to provide your oncology and hematology care.  To afford each patient quality time with our providers, please arrive at least 15 minutes before your scheduled appointment time.  With your help, our goal is to use those 15 minutes to complete the necessary work-up to ensure our physicians have the information they need to help with your evaluation and healthcare recommendations.    Effective January 1st, 2014, we ask that you re-schedule your appointment with our physicians should you arrive 10 or more minutes late for your appointment.  We strive to give you quality time with our providers, and arriving late affects you and other patients whose appointments are after yours.    Again, thank you for choosing [facility].  Our hope is that these requests will decrease the amount of time that you wait before being seen by our physicians.       _____________________________________________________________  Should you have questions after your visit to [facility], please contact our office at [phone] between the hours of 8:30 a.m. and 5:00 p.m.  Voicemails left after 4:30 p.m. will not  be returned until the following business day.  For prescription refill requests, have your pharmacy contact our office with your prescription refill request.

## 2013-07-28 ENCOUNTER — Other Ambulatory Visit (HOSPITAL_COMMUNITY): Payer: Self-pay | Admitting: Oncology

## 2013-07-28 DIAGNOSIS — D500 Iron deficiency anemia secondary to blood loss (chronic): Secondary | ICD-10-CM

## 2013-07-31 LAB — BETA 2 MICROGLOBULIN, SERUM: Beta-2 Microglobulin: 10.9 mg/L — ABNORMAL HIGH (ref <=2.51)

## 2013-07-31 LAB — ERYTHROPOIETIN: Erythropoietin: 18.9 m[IU]/mL — ABNORMAL HIGH (ref 2.6–18.5)

## 2013-08-01 LAB — SOLUBLE TRANSFERRIN RECEPTOR: Transferrin Receptor, Soluble: 1.08 mg/L (ref 0.76–1.76)

## 2013-08-02 ENCOUNTER — Encounter (HOSPITAL_BASED_OUTPATIENT_CLINIC_OR_DEPARTMENT_OTHER): Payer: Medicare Other

## 2013-08-02 DIAGNOSIS — D509 Iron deficiency anemia, unspecified: Secondary | ICD-10-CM

## 2013-08-02 DIAGNOSIS — D500 Iron deficiency anemia secondary to blood loss (chronic): Secondary | ICD-10-CM

## 2013-08-02 MED ORDER — SODIUM CHLORIDE 0.9 % IV SOLN
Freq: Once | INTRAVENOUS | Status: AC
  Administered 2013-08-02: 14:00:00 via INTRAVENOUS

## 2013-08-02 MED ORDER — SODIUM CHLORIDE 0.9 % IJ SOLN: 10.0000 mL | Freq: Once | INTRAMUSCULAR | Status: DC

## 2013-08-02 MED ORDER — SODIUM CHLORIDE 0.9 % IV SOLN
1020.0000 mg | Freq: Once | INTRAVENOUS | Status: AC
  Administered 2013-08-02: 1020 mg via INTRAVENOUS
  Filled 2013-08-02: qty 34

## 2013-08-02 NOTE — Progress Notes (Signed)
Tolerated well

## 2013-08-04 ENCOUNTER — Encounter (HOSPITAL_COMMUNITY): Payer: Medicare Other

## 2013-08-11 NOTE — Progress Notes (Signed)
Labs drawn

## 2013-08-21 ENCOUNTER — Ambulatory Visit (INDEPENDENT_AMBULATORY_CARE_PROVIDER_SITE_OTHER): Payer: Medicare Other | Admitting: *Deleted

## 2013-08-21 DIAGNOSIS — I4891 Unspecified atrial fibrillation: Secondary | ICD-10-CM

## 2013-08-21 DIAGNOSIS — I480 Paroxysmal atrial fibrillation: Secondary | ICD-10-CM

## 2013-08-21 DIAGNOSIS — Z5181 Encounter for therapeutic drug level monitoring: Secondary | ICD-10-CM

## 2013-08-21 DIAGNOSIS — I495 Sick sinus syndrome: Secondary | ICD-10-CM

## 2013-08-21 DIAGNOSIS — Z7901 Long term (current) use of anticoagulants: Secondary | ICD-10-CM

## 2013-08-21 LAB — POCT INR: INR: 3

## 2013-09-04 ENCOUNTER — Ambulatory Visit (INDEPENDENT_AMBULATORY_CARE_PROVIDER_SITE_OTHER): Payer: Medicare Other | Admitting: *Deleted

## 2013-09-04 ENCOUNTER — Telehealth: Payer: Self-pay | Admitting: Cardiovascular Disease

## 2013-09-04 DIAGNOSIS — I4891 Unspecified atrial fibrillation: Secondary | ICD-10-CM

## 2013-09-04 DIAGNOSIS — Z5181 Encounter for therapeutic drug level monitoring: Secondary | ICD-10-CM

## 2013-09-04 DIAGNOSIS — I495 Sick sinus syndrome: Secondary | ICD-10-CM

## 2013-09-04 DIAGNOSIS — I480 Paroxysmal atrial fibrillation: Secondary | ICD-10-CM

## 2013-09-04 DIAGNOSIS — Z7901 Long term (current) use of anticoagulants: Secondary | ICD-10-CM

## 2013-09-04 LAB — POCT INR: INR: 2

## 2013-09-04 MED ORDER — METOPROLOL TARTRATE 25 MG PO TABS: ORAL_TABLET | ORAL | Status: DC

## 2013-09-04 NOTE — Telephone Encounter (Signed)
Medication sent to pharmacy  

## 2013-09-04 NOTE — Telephone Encounter (Signed)
Received fax refill request  Rx # [id number] Medication:  Metoprolol 25 mg tablet Qty 180 Sig:  Take one tablet by mouth twice daily Physician:  Bronson Ing

## 2013-09-25 ENCOUNTER — Telehealth: Payer: Self-pay | Admitting: Cardiovascular Disease

## 2013-09-25 ENCOUNTER — Ambulatory Visit (INDEPENDENT_AMBULATORY_CARE_PROVIDER_SITE_OTHER): Payer: Medicare Other | Admitting: *Deleted

## 2013-09-25 DIAGNOSIS — Z5181 Encounter for therapeutic drug level monitoring: Secondary | ICD-10-CM

## 2013-09-25 DIAGNOSIS — Z7901 Long term (current) use of anticoagulants: Secondary | ICD-10-CM

## 2013-09-25 DIAGNOSIS — I480 Paroxysmal atrial fibrillation: Secondary | ICD-10-CM

## 2013-09-25 DIAGNOSIS — I495 Sick sinus syndrome: Secondary | ICD-10-CM

## 2013-09-25 DIAGNOSIS — I4891 Unspecified atrial fibrillation: Secondary | ICD-10-CM

## 2013-09-25 LAB — POCT INR: INR: 1.9

## 2013-09-25 MED ORDER — VERAPAMIL HCL ER 180 MG PO TBCR
EXTENDED_RELEASE_TABLET | ORAL | Status: DC
Start: 2013-09-25 — End: 2013-11-06

## 2013-09-25 NOTE — Telephone Encounter (Signed)
Received fax refill request  Rx # [id number] Medication:  Verapamil ER 180 mg tab Qty 90 Sig:  Take one tablet by mouth at bedtime Physician:  Bronson Ing

## 2013-09-25 NOTE — Telephone Encounter (Signed)
Medication sent via escribe.  

## 2013-10-16 ENCOUNTER — Encounter: Payer: Self-pay | Admitting: Internal Medicine

## 2013-10-16 ENCOUNTER — Ambulatory Visit (INDEPENDENT_AMBULATORY_CARE_PROVIDER_SITE_OTHER): Payer: Medicare Other | Admitting: *Deleted

## 2013-10-16 ENCOUNTER — Ambulatory Visit (INDEPENDENT_AMBULATORY_CARE_PROVIDER_SITE_OTHER): Payer: Medicare Other | Admitting: Internal Medicine

## 2013-10-16 VITALS — BP 110/58 | HR 72 | Wt 146.0 lb

## 2013-10-16 DIAGNOSIS — Z5181 Encounter for therapeutic drug level monitoring: Secondary | ICD-10-CM

## 2013-10-16 DIAGNOSIS — I495 Sick sinus syndrome: Secondary | ICD-10-CM

## 2013-10-16 DIAGNOSIS — I509 Heart failure, unspecified: Secondary | ICD-10-CM

## 2013-10-16 DIAGNOSIS — I5041 Acute combined systolic (congestive) and diastolic (congestive) heart failure: Secondary | ICD-10-CM

## 2013-10-16 DIAGNOSIS — I480 Paroxysmal atrial fibrillation: Secondary | ICD-10-CM

## 2013-10-16 DIAGNOSIS — I5033 Acute on chronic diastolic (congestive) heart failure: Secondary | ICD-10-CM | POA: Insufficient documentation

## 2013-10-16 DIAGNOSIS — Z7901 Long term (current) use of anticoagulants: Secondary | ICD-10-CM

## 2013-10-16 DIAGNOSIS — I059 Rheumatic mitral valve disease, unspecified: Secondary | ICD-10-CM

## 2013-10-16 DIAGNOSIS — I340 Nonrheumatic mitral (valve) insufficiency: Secondary | ICD-10-CM

## 2013-10-16 DIAGNOSIS — I4891 Unspecified atrial fibrillation: Secondary | ICD-10-CM

## 2013-10-16 LAB — MDC_IDC_ENUM_SESS_TYPE_INCLINIC
Battery Remaining Longevity: 24 mo
Brady Statistic AP VP Percent: 24 %
Brady Statistic AP VS Percent: 2 %
Brady Statistic AS VS Percent: 41 %
Date Time Interrogation Session: 20150803111524
Lead Channel Impedance Value: 457 Ohm
Lead Channel Pacing Threshold Amplitude: 0.75 V
Lead Channel Pacing Threshold Pulse Width: 0.4 ms
Lead Channel Sensing Intrinsic Amplitude: 15.67 mV
Lead Channel Setting Pacing Amplitude: 2.5 V
Lead Channel Setting Pacing Pulse Width: 0.4 ms
Lead Channel Setting Sensing Sensitivity: 5.6 mV
MDC IDC MSMT BATTERY IMPEDANCE: 1755 Ohm
MDC IDC MSMT BATTERY VOLTAGE: 2.71 V
MDC IDC MSMT LEADCHNL RA IMPEDANCE VALUE: 337 Ohm
MDC IDC MSMT LEADCHNL RA SENSING INTR AMPL: 0.25 mV
MDC IDC SET LEADCHNL RA PACING AMPLITUDE: 2 V
MDC IDC STAT BRADY AS VP PERCENT: 33 %

## 2013-10-16 LAB — POCT INR: INR: 3.1

## 2013-10-16 NOTE — Assessment & Plan Note (Signed)
This appears to be a new problem. It is unclear if her volume overload is due to worsening mitral valve function (She has a loud murmur) or due to renal insufficiency, or something else. She is not feeling well. I would like to increase her diuretic but she does not like to have to go to the bathroom so much. She may ultimately require hospitalization. I have recommended she undergo 2D echo, obtain lab work and we will make additional rec's based on the results above. I have asked the patient to reduce her sodium intake.

## 2013-10-16 NOTE — Patient Instructions (Addendum)
Your physician recommends that you schedule a follow-up appointment in: 6 months with [facility] clinic You will receive a reminder letter two months in advance reminding you to call and schedule your appointment. If you don't receive this letter, please contact our office.  12 months with Dr Knox Saliva will receive a reminder letter two months in advance reminding you to call and schedule your appointment. If you don't receive this letter, please contact our office.  Your physician has requested that you have an echocardiogram. Echocardiography is a painless test that uses sound waves to create images of your heart. It provides your doctor with information about the size and shape of your heart and how well your heart's chambers and valves are working. This procedure takes approximately one hour. There are no restrictions for this procedure.

## 2013-10-16 NOTE — Progress Notes (Signed)
HPI [name] returns today for followup. She is a pleasant 80 yo woman, with a h/o symptomatic bradycardia, s/p PPM insertion. She also has a h/o diastolic CHF, HTN, and COPD. She denies chest pain or syncope.  Over the past month or two, she has had worsening peripheral edema, abdominal distention and sob. She denies dietary indiscretion above her usual. She does eat pre-prepared frozen dinners which are high in salt. She has had trouble with taking a whole diuretic because she doesn't like having to go to the bathroom so often.  Allergies  Allergen Reactions  . Vesicare [Solifenacin] Other (See Comments)    Severe stomach pain  . Detrol [Tolterodine Tartrate] Other (See Comments)    Patient states: "I was told to never take it again, I do not recall the reaction"  . Sanctura [Trospium Chloride] Nausea Only and Other (See Comments)    Intense lower abd pain  . Sulfa Antibiotics Other (See Comments)    Extreme dizziness  . Sulfonamide Derivatives Other (See Comments)    REACTION: GI distress  . Trimethoprim     Other reaction(s): Dermatitis     Current Outpatient Prescriptions  Medication Sig Dispense Refill  . acetaminophen (TYLENOL) 500 MG tablet Take 1,000 mg by mouth 4 (four) times daily.        [name] albuterol (VENTOLIN HFA) 108 (90 BASE) MCG/ACT inhaler 2 puffs. Inhale 2 puffs into the lungs every 6 (six) hours as needed for Wheezing.      . calcium-vitamin D (GNP CALCIUM 500/D) 500-200 MG-UNIT per tablet 1 tablet. Take 1 tablet by mouth 2 times daily with meals.      [name] estradiol (ESTRACE VAGINAL) 0.1 MG/GM vaginal cream Place 2 g vaginally twice a week.      . losartan (COZAAR) 100 MG tablet 100 mg. Take 100 mg by mouth daily.      . metolazone (ZAROXOLYN) 2.5 MG tablet Take 1 tablet (2.5 mg total) by mouth as needed. Take 1 tablet by mouth as needed for leg swelling/SOB  30 tablet  2  . metoprolol tartrate (LOPRESSOR) 25 MG tablet TAKE 1 TABLET (25 MG TOTAL) BY MOUTH 2 (TWO) TIMES  DAILY.  180 tablet  2  . Multiple Vitamin (MULTI-VITAMINS) TABS 1 tablet. Take 1 tablet by mouth daily.      . nitroGLYCERIN (NITROSTAT) 0.4 MG SL tablet Place 1 tablet (0.4 mg total) under the tongue every 5 (five) minutes as needed for chest pain.  24 tablet  4  . pravastatin (PRAVACHOL) 40 MG tablet 40 mg. Take 40 mg by mouth daily.      [name] torsemide (DEMADEX) 20 MG tablet 20 mg. Take 20 mg by mouth daily.      . verapamil (CALAN-SR) 180 MG CR tablet TAKE (1) TABLET BY MOUTH AT BEDTIME.  90 tablet  0  . warfarin (COUMADIN) 2.5 MG tablet 2.5 mg. Take 1/2 a tablet mon, wed, and fri. Take a whole tablet tues, thurs, sat, sun       No current facility-administered medications for this visit.     Past Medical History  Diagnosis Date  . Sick sinus syndrome 11/2006    paroxysmal atrial fibrillation; dual-chamber pacemaker in 11/2006  . Aortic stenosis     mild  . Left bundle branch block   . Congestive heart failure     Congestive heart failure with normal ejection fraction  . Arteriosclerotic cardiovascular disease (ASCVD)     coronary angio in 10/09:50% left anterior descending;  80% PDA; medical therapy advisedOrthostatic decrease in blood pressure  . Epistaxis     mild with negative ENT evaluation  . Dizziness     chronic  . Osteoarthritis   . Irritable bowel syndrome   . Urinary frequency     with incontinence  . Gastroesophageal reflux disease     Esophageal dilatation for stricture in 09/2011  . Rosacea   . Chronic lymphocytic leukemia     Stage I with anemia H./H. of 9/27 in 11/09 with normal MCV; iron deficiency in bone marrow  . Orthostatic hypotension   . Chronic anticoagulation   . Diverticulosis   . 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 Chronic kidney disease      Creatinine of 1.7 in 8/08, 1.4 in 10/08 and 1.5 in 11/09  . Urethral caruncle   . Iron deficiency anemia secondary to blood  loss (chronic) 01/19/2013  . History of rectal bleeding 01/19/2013    ROS:   All systems reviewed and negative except as noted in the HPI.   Past Surgical History  Procedure Laterality Date  . Cholecystectomy    . Cervical spine surgery    . Lumbar disc surgery    . Bladder suspension    . Abdominal hysterectomy  1972  . Cataract extraction      Bilateral  . Esophagogastroduodenoscopy  01/09/09    Rourk -noncritical Schatzki's ring, small hiatal hernia, fundal gland polyps, bile-stained gastric mucosa, polypoid lesion straddling D1 and D2 1-2 cm, localized lymphangitic-appearing mucosa at D2 biopsied (adenomatous polyp with high grade glandular dysplasia  . A-v cardiac pacemaker insertion  11/2006    [employer]     Family History  Problem Relation Age of Onset  . Heart failure Father   . Cancer Father   . Diabetes Mother      History   Social History  . Marital Status: Widowed    Spouse Name: N/A    Number of Children: N/A  . Years of Education: N/A   Occupational History  . retired    Social History Main Topics  . Smoking status: Never Smoker   . Smokeless tobacco: Never Used  . Alcohol Use: No  . Drug Use: No  . Sexual Activity: No   Other Topics Concern  . Not on file   Social History Narrative  . No narrative on file     BP 110/58  Pulse 72  Wt 146 lb (66.225 kg)  SpO2 95%  Physical Exam:  chronically ill appearing elderly woman, NAD HEENT: Unremarkable Neck:  10 cm JVD, no thyromegally Back:  No CVA tenderness Lungs:  Basilar rales HEART:  Regular rate rhythm, 3/6 systolic murmur, no rubs, no clicks Abd:  soft, positive bowel sounds, no organomegally, no rebound, no guarding Ext:  2 plus pulses, 3+ peripheral edema, no cyanosis, no clubbing Skin:  No rashes no nodules Neuro:  CN II through XII intact, motor grossly intact   DEVICE  Normal device function.  See PaceArt for details.   Assess/Plan:

## 2013-10-16 NOTE — Assessment & Plan Note (Signed)
She will continue her anti-coagulation with coumadin for now.

## 2013-10-17 ENCOUNTER — Ambulatory Visit (HOSPITAL_COMMUNITY)
Admission: RE | Admit: 2013-10-17 | Discharge: 2013-10-17 | Disposition: A | Discharge status: Home / Self Care | Payer: Medicare Other | Source: Ambulatory Visit | Attending: Internal Medicine | Admitting: Internal Medicine

## 2013-10-17 DIAGNOSIS — I10 Essential (primary) hypertension: Secondary | ICD-10-CM | POA: Insufficient documentation

## 2013-10-17 DIAGNOSIS — I447 Left bundle-branch block, unspecified: Secondary | ICD-10-CM | POA: Insufficient documentation

## 2013-10-17 DIAGNOSIS — I079 Rheumatic tricuspid valve disease, unspecified: Secondary | ICD-10-CM | POA: Diagnosis not present

## 2013-10-17 DIAGNOSIS — R079 Chest pain, unspecified: Secondary | ICD-10-CM | POA: Diagnosis present

## 2013-10-17 DIAGNOSIS — I359 Nonrheumatic aortic valve disorder, unspecified: Secondary | ICD-10-CM

## 2013-10-17 DIAGNOSIS — J449 Chronic obstructive pulmonary disease, unspecified: Secondary | ICD-10-CM | POA: Insufficient documentation

## 2013-10-17 DIAGNOSIS — I5041 Acute combined systolic (congestive) and diastolic (congestive) heart failure: Secondary | ICD-10-CM | POA: Diagnosis not present

## 2013-10-17 DIAGNOSIS — J4489 Other specified chronic obstructive pulmonary disease: Secondary | ICD-10-CM | POA: Insufficient documentation

## 2013-10-17 DIAGNOSIS — I498 Other specified cardiac arrhythmias: Secondary | ICD-10-CM | POA: Insufficient documentation

## 2013-10-17 DIAGNOSIS — I340 Nonrheumatic mitral (valve) insufficiency: Secondary | ICD-10-CM

## 2013-10-17 NOTE — Progress Notes (Signed)
  Echocardiogram 2D Echocardiogram has been performed.  [place], [place] 10/17/2013, 4:15 PM

## 2013-10-27 ENCOUNTER — Ambulatory Visit (HOSPITAL_COMMUNITY): Payer: Medicare Other | Admitting: Oncology

## 2013-10-27 ENCOUNTER — Other Ambulatory Visit (HOSPITAL_COMMUNITY): Payer: Medicare Other

## 2013-10-28 NOTE — Progress Notes (Signed)
[name] Bogus, MD [place] [address]  Chronic lymphocytic leukemia - Plan: TSH  Iron deficiency anemia secondary to blood loss (chronic)  Anemia, normocytic normochromic - Plan: TSH  Abdominal distension - Plan: US Paracentesis, US Abdomen Limited, CANCELED: US Abdomen Limited, CANCELED: US Paracentesis, CANCELED: US Pelvis Complete  Bilateral leg edema - Plan: US Pelvis Complete  CURRENT THERAPY: Observation/Surveillance  INTERVAL HISTORY: [name] 80 y.o. female returns for  regular  visit for followup of iron deficiency secondary to chronic blood loss in the setting of CLL, chronic anticoagulation, and carbuncle of urethra causing GU blood loss.   I personally reviewed and went over laboratory results with the patient.  The results are noted within this dictation.  Her labs are very stable, but iron studies are pending.  She reports increased fatigue and tiredness, but she is not clear that it is worse than baseline.    She notes increased abdominal distension that is obvious on exam.  She reports that her appetite is decreased.  She denies any constipation.  She admits to increased SOB and dyspnea on exertion.  She saw her cardiologist recently and she reports he was pleased with her 2D echo recently.  Given her signs and symptoms, I suspect abdominal ascites, of unknown etiology, which is contributing to her symptoms.  Additionally, she is experiencing bilateral LE edema.  She admits to discomfort of her LE.  She admits that she does not take her "fluid pill," Lasix as prescribed.  She notes that she take 1/2 pill per day and skips a few days occasionally.  Her cardiologist is the prescriber of the medication.  I recommended she follow his recommendations.  Additionally, I recommended LE elevation.  Hematologically, she denies any complaints and ROS questioning is negative.  Past Medical History  Diagnosis Date  . Sick sinus syndrome  11/2006    paroxysmal atrial fibrillation; dual-chamber pacemaker in 11/2006  . Aortic stenosis     mild  . Left bundle branch block   . Congestive heart failure     Congestive heart failure with normal ejection fraction  . Arteriosclerotic cardiovascular disease (ASCVD)     coronary angio in 10/09:50% left anterior descending; 80% PDA; medical therapy advisedOrthostatic decrease in blood pressure  . Epistaxis     mild with negative ENT evaluation  . Dizziness     chronic  . Osteoarthritis   . Irritable bowel syndrome   . Urinary frequency     with incontinence  . Gastroesophageal reflux disease     Esophageal dilatation for stricture in 09/2011  . Rosacea   . Chronic lymphocytic leukemia     Stage I with anemia H./H. of 9/27 in 11/09 with normal MCV; iron deficiency in bone marrow  . Orthostatic hypotension   . Chronic anticoagulation   . Diverticulosis   . 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 Chronic kidney disease      Creatinine of 1.7 in 8/08, 1.4 in 10/08 and 1.5 in 11/09  . Urethral caruncle   . Iron deficiency anemia secondary to blood loss (chronic) 01/19/2013  . History of rectal bleeding 01/19/2013    has HYPERLIPIDEMIA; HYPERTENSION; Arteriosclerotic cardiovascular disease (ASCVD); Chronic anticoagulation; Sick sinus syndrome; Aortic stenosis; Congestive heart failure; Chronic kidney disease; Gastroesophageal reflux disease; Chronic lymphocytic leukemia; Transient blindness; Paroxysmal atrial fibrillation; Pacemaker; Hematuria; Anemia, normocytic normochromic; Fasting hyperglycemia;  Iron deficiency anemia secondary to blood loss (chronic); History of rectal bleeding; Encounter for therapeutic drug monitoring; and Acute on chronic diastolic congestive heart failure on her problem list.     is allergic to vesicare; detrol; sanctura; sulfa antibiotics; sulfonamide derivatives; and  trimethoprim.  [name] does not currently have medications on file.  Past Surgical History  Procedure Laterality Date  . Cholecystectomy    . Cervical spine surgery    . Lumbar disc surgery    . Bladder suspension    . Abdominal hysterectomy  1972  . Cataract extraction      Bilateral  . Esophagogastroduodenoscopy  01/09/09    Rourk -noncritical Schatzki's ring, small hiatal hernia, fundal gland polyps, bile-stained gastric mucosa, polypoid lesion straddling D1 and D2 1-2 cm, localized lymphangitic-appearing mucosa at D2 biopsied (adenomatous polyp with high grade glandular dysplasia  . A-v cardiac pacemaker insertion  11/2006    [employer]    Denies any headaches, dizziness, double vision, fevers, chills, night sweats, nausea, vomiting, diarrhea, constipation, chest pain, heart palpitations, shortness of breath, blood in stool, black tarry stool, urinary pain, urinary burning, urinary frequency, hematuria.   PHYSICAL EXAMINATION  ECOG PERFORMANCE STATUS: 3 - Symptomatic, >50% confined to bed  Filed Vitals:   10/30/13 1300  BP: 119/44  Pulse: 64  Temp: 97.7 F (36.5 C)  Resp: 21    GENERAL:alert, cooperative, ill looking and appearing tired SKIN: skin color, texture, turgor are normal, no rashes or significant lesions HEAD: Normocephalic, No masses, lesions, tenderness or abnormalities EYES: normal, PERRLA, EOMI, Conjunctiva are pink and non-injected EARS: External ears normal OROPHARYNX:mucous membranes are moist  NECK: supple, trachea midline LYMPH:  no palpable lymphadenopathy BREAST:not examined LUNGS: clear to auscultation  HEART: regular rate & rhythm and 3/6 systolic murmur ABDOMEN:normal bowel sounds and distended BACK: Back symmetric, no curvature. EXTREMITIES:less then 2 second capillary refill, no joint deformities, effusion, or inflammation, no skin discoloration, no clubbing, no cyanosis, positive findings:  edema B/L 2-3+ pitting edema  NEURO: alert &  oriented x 3 with fluent speech, no focal motor/sensory deficits   LABORATORY DATA: CBC    Component Value Date/Time   WBC 12.5* 10/30/2013 1242   RBC 3.03* 10/30/2013 1242   RBC 3.09* 07/27/2013 1035   HGB 9.7* 10/30/2013 1242   HCT 30.3* 10/30/2013 1242   PLT 201 10/30/2013 1242   MCV 100.0 10/30/2013 1242   MCH 32.0 10/30/2013 1242   MCHC 32.0 10/30/2013 1242   RDW 14.5 10/30/2013 1242   LYMPHSABS 6.0* 10/30/2013 1242   MONOABS 0.4 10/30/2013 1242   EOSABS 0.2 10/30/2013 1242   BASOSABS 0.1 10/30/2013 1242    Lab Results  Component Value Date   IRON 37* 07/27/2013   TIBC 337 07/27/2013   FERRITIN 76 07/27/2013     ASSESSMENT:  1. CLL, stable and no indication for treatment at this time. She may be a candidate for Imbruvica if needed in the future.  2. Mixed anemia and multifactorial with a definite element of iron deficiency requiring IV Feraheme.   A. CLL   B. Chronic blood loss   C. Aortic stenosis, moderate-severe, causing turbulent blood flow   D. Chronic anticoagulation   E. Chronic renal disease   F. CHF   G. Hematuria   H. Sick Sinus Syndrome  3. Atrial fibrillation, on chronic anticoagulation, with left bundle branch block, S/P dual-chamber pacemaker in Sept 2008.  4. Status post resection of skin cancers, basal cell found on the right shoulder.  5. Iron deficiency anemia secondary to Chronic GI blood loss.  6. GI blood loss, secondary to telangiectasias from severe aortic stenosis.  7. Parallel palpable ridges of fingernails, will check iron studies today, other differential diagnosis is thyroid issues. TSH ordered today. 8. B/L LE edema 9. Abdominal distension 10. Decreased appetite 11. Chronic fatigue  Patient Active Problem List   Diagnosis Date Noted  . Acute on chronic diastolic congestive heart failure 10/16/2013  . Encounter for therapeutic drug monitoring 04/24/2013  . Iron deficiency anemia secondary to blood loss (chronic) 01/19/2013  . History of rectal  bleeding 01/19/2013  . Anemia, normocytic normochromic 10/23/2012  . Fasting hyperglycemia 10/23/2012  . Hematuria 10/13/2012  . Pacemaker 10/15/2011  . Transient blindness 09/14/2011  . Paroxysmal atrial fibrillation 09/14/2011  . Aortic stenosis   . Congestive heart failure   . Chronic kidney disease   . Gastroesophageal reflux disease   . Chronic lymphocytic leukemia   . Chronic anticoagulation 06/10/2010  . Arteriosclerotic cardiovascular disease (ASCVD) 03/22/2009  . HYPERLIPIDEMIA 12/17/2008  . HYPERTENSION 12/17/2008  . Sick sinus syndrome 11/15/2006     PLAN:  1. I personally reviewed and went over laboratory results with the patient.  The results are noted within this dictation. 2. Labs today: CBC diff, CMET, LDH, ESR, B2M, iron/TIBC, Ferritin, TSH. 3. Follow-up with GI as scheduled 4. Follow-up with primary care provider as directed 5. Follow-up with cardiology as directed 6. Return in 1-2 weeks for follow-up   THERAPY PLAN:  From a CLL standpoint, she does not meet clear-cut evidence for treatment at this time. These indications follow below. HOWEVER, symptomatically speaking, she may qualify for CLL treatment consisting of Gazyva and Chlorambucil as this has been studied in CLL patients older than 78 years old with survival benefit and QOL improvement with excellent tolerability.  We will continue to monitor her counts and support her counts as needed with IV feraheme +/- ESA therapy.  NCCN guidelines for indication for treatment of CLL are:  A. Eligible for clinical trial  B. Significant disease-related symptoms   1. Fatigue (severe)   2. Night sweats   3. Weight loss   4. Fever without infection  C. Threatened end-organ function  D. Progressive bulky disease (spleen >6cm below costal margin, lymph nodes >10 cm)  E. Progressive anemia  F. Progressive thrombocytopenia.   All questions were answered. The patient knows to call the clinic with any problems,  questions or concerns. We can certainly see the patient much sooner if necessary.  Patient and plan discussed with Dr. Farrel Gobble and he is in agreement with the aforementioned.   [name] 10/30/2013

## 2013-10-30 ENCOUNTER — Encounter (HOSPITAL_COMMUNITY): Payer: Medicare Other | Attending: Oncology

## 2013-10-30 ENCOUNTER — Other Ambulatory Visit (HOSPITAL_COMMUNITY): Payer: Self-pay | Admitting: Oncology

## 2013-10-30 ENCOUNTER — Telehealth (HOSPITAL_COMMUNITY): Payer: Self-pay

## 2013-10-30 ENCOUNTER — Ambulatory Visit (HOSPITAL_COMMUNITY)
Admission: RE | Admit: 2013-10-30 | Discharge: 2013-10-30 | Disposition: A | Discharge status: Home / Self Care | Payer: Medicare Other | Source: Ambulatory Visit | Attending: Oncology | Admitting: Oncology

## 2013-10-30 ENCOUNTER — Encounter (HOSPITAL_COMMUNITY): Payer: Self-pay | Admitting: Oncology

## 2013-10-30 ENCOUNTER — Encounter (HOSPITAL_BASED_OUTPATIENT_CLINIC_OR_DEPARTMENT_OTHER): Payer: Medicare Other | Admitting: Oncology

## 2013-10-30 ENCOUNTER — Encounter (HOSPITAL_COMMUNITY): Payer: Self-pay

## 2013-10-30 VITALS — BP 119/44 | HR 64 | Temp 97.7°F | Resp 21 | Wt 145.7 lb

## 2013-10-30 DIAGNOSIS — Z7901 Long term (current) use of anticoagulants: Secondary | ICD-10-CM | POA: Insufficient documentation

## 2013-10-30 DIAGNOSIS — I359 Nonrheumatic aortic valve disorder, unspecified: Secondary | ICD-10-CM | POA: Diagnosis not present

## 2013-10-30 DIAGNOSIS — R319 Hematuria, unspecified: Secondary | ICD-10-CM | POA: Diagnosis not present

## 2013-10-30 DIAGNOSIS — E785 Hyperlipidemia, unspecified: Secondary | ICD-10-CM | POA: Diagnosis not present

## 2013-10-30 DIAGNOSIS — R0609 Other forms of dyspnea: Secondary | ICD-10-CM | POA: Insufficient documentation

## 2013-10-30 DIAGNOSIS — R600 Localized edema: Secondary | ICD-10-CM

## 2013-10-30 DIAGNOSIS — Z882 Allergy status to sulfonamides status: Secondary | ICD-10-CM | POA: Diagnosis not present

## 2013-10-30 DIAGNOSIS — R42 Dizziness and giddiness: Secondary | ICD-10-CM | POA: Insufficient documentation

## 2013-10-30 DIAGNOSIS — M1990 Unspecified osteoarthritis, unspecified site: Secondary | ICD-10-CM | POA: Diagnosis not present

## 2013-10-30 DIAGNOSIS — D500 Iron deficiency anemia secondary to blood loss (chronic): Secondary | ICD-10-CM

## 2013-10-30 DIAGNOSIS — I4891 Unspecified atrial fibrillation: Secondary | ICD-10-CM | POA: Insufficient documentation

## 2013-10-30 DIAGNOSIS — I5032 Chronic diastolic (congestive) heart failure: Secondary | ICD-10-CM | POA: Insufficient documentation

## 2013-10-30 DIAGNOSIS — I495 Sick sinus syndrome: Secondary | ICD-10-CM | POA: Diagnosis not present

## 2013-10-30 DIAGNOSIS — R609 Edema, unspecified: Secondary | ICD-10-CM | POA: Insufficient documentation

## 2013-10-30 DIAGNOSIS — Z950 Presence of cardiac pacemaker: Secondary | ICD-10-CM | POA: Diagnosis not present

## 2013-10-30 DIAGNOSIS — I447 Left bundle-branch block, unspecified: Secondary | ICD-10-CM | POA: Diagnosis not present

## 2013-10-30 DIAGNOSIS — R040 Epistaxis: Secondary | ICD-10-CM | POA: Diagnosis not present

## 2013-10-30 DIAGNOSIS — K5730 Diverticulosis of large intestine without perforation or abscess without bleeding: Secondary | ICD-10-CM | POA: Diagnosis not present

## 2013-10-30 DIAGNOSIS — I2510 Atherosclerotic heart disease of native coronary artery without angina pectoris: Secondary | ICD-10-CM | POA: Insufficient documentation

## 2013-10-30 DIAGNOSIS — N362 Urethral caruncle: Secondary | ICD-10-CM | POA: Insufficient documentation

## 2013-10-30 DIAGNOSIS — D649 Anemia, unspecified: Secondary | ICD-10-CM

## 2013-10-30 DIAGNOSIS — R141 Gas pain: Secondary | ICD-10-CM | POA: Diagnosis not present

## 2013-10-30 DIAGNOSIS — L719 Rosacea, unspecified: Secondary | ICD-10-CM | POA: Diagnosis not present

## 2013-10-30 DIAGNOSIS — R142 Eructation: Secondary | ICD-10-CM | POA: Diagnosis present

## 2013-10-30 DIAGNOSIS — I509 Heart failure, unspecified: Secondary | ICD-10-CM | POA: Insufficient documentation

## 2013-10-30 DIAGNOSIS — K589 Irritable bowel syndrome without diarrhea: Secondary | ICD-10-CM | POA: Diagnosis not present

## 2013-10-30 DIAGNOSIS — R0602 Shortness of breath: Secondary | ICD-10-CM | POA: Diagnosis not present

## 2013-10-30 DIAGNOSIS — R188 Other ascites: Secondary | ICD-10-CM | POA: Insufficient documentation

## 2013-10-30 DIAGNOSIS — R599 Enlarged lymph nodes, unspecified: Secondary | ICD-10-CM | POA: Insufficient documentation

## 2013-10-30 DIAGNOSIS — R5383 Other fatigue: Secondary | ICD-10-CM

## 2013-10-30 DIAGNOSIS — R3981 Functional urinary incontinence: Secondary | ICD-10-CM | POA: Insufficient documentation

## 2013-10-30 DIAGNOSIS — R0989 Other specified symptoms and signs involving the circulatory and respiratory systems: Secondary | ICD-10-CM | POA: Insufficient documentation

## 2013-10-30 DIAGNOSIS — C9110 Chronic lymphocytic leukemia of B-cell type not having achieved remission: Secondary | ICD-10-CM | POA: Diagnosis not present

## 2013-10-30 DIAGNOSIS — Z9889 Other specified postprocedural states: Secondary | ICD-10-CM | POA: Insufficient documentation

## 2013-10-30 DIAGNOSIS — D1330 Benign neoplasm of unspecified part of small intestine: Secondary | ICD-10-CM | POA: Diagnosis not present

## 2013-10-30 DIAGNOSIS — M7989 Other specified soft tissue disorders: Secondary | ICD-10-CM | POA: Diagnosis not present

## 2013-10-30 DIAGNOSIS — N189 Chronic kidney disease, unspecified: Secondary | ICD-10-CM | POA: Insufficient documentation

## 2013-10-30 DIAGNOSIS — R350 Frequency of micturition: Secondary | ICD-10-CM | POA: Diagnosis not present

## 2013-10-30 DIAGNOSIS — H53129 Transient visual loss, unspecified eye: Secondary | ICD-10-CM | POA: Insufficient documentation

## 2013-10-30 DIAGNOSIS — I951 Orthostatic hypotension: Secondary | ICD-10-CM | POA: Diagnosis not present

## 2013-10-30 DIAGNOSIS — I10 Essential (primary) hypertension: Secondary | ICD-10-CM | POA: Diagnosis not present

## 2013-10-30 DIAGNOSIS — Z888 Allergy status to other drugs, medicaments and biological substances status: Secondary | ICD-10-CM | POA: Insufficient documentation

## 2013-10-30 DIAGNOSIS — R5381 Other malaise: Secondary | ICD-10-CM | POA: Insufficient documentation

## 2013-10-30 DIAGNOSIS — R140 Abdominal distension (gaseous): Secondary | ICD-10-CM

## 2013-10-30 DIAGNOSIS — K219 Gastro-esophageal reflux disease without esophagitis: Secondary | ICD-10-CM | POA: Insufficient documentation

## 2013-10-30 DIAGNOSIS — R143 Flatulence: Secondary | ICD-10-CM

## 2013-10-30 DIAGNOSIS — I129 Hypertensive chronic kidney disease with stage 1 through stage 4 chronic kidney disease, or unspecified chronic kidney disease: Secondary | ICD-10-CM | POA: Diagnosis not present

## 2013-10-30 LAB — COMPREHENSIVE METABOLIC PANEL
ALBUMIN: 3.5 g/dL (ref 3.5–5.2)
ALT: 6 U/L (ref 0–35)
ANION GAP: 15 (ref 5–15)
AST: 14 U/L (ref 0–37)
Alkaline Phosphatase: 151 U/L — ABNORMAL HIGH (ref 39–117)
BUN: 60 mg/dL — AB (ref 6–23)
CALCIUM: 9 mg/dL (ref 8.4–10.5)
CO2: 24 mEq/L (ref 19–32)
CREATININE: 1.48 mg/dL — AB (ref 0.50–1.10)
Chloride: 104 mEq/L (ref 96–112)
GFR calc non Af Amer: 29 mL/min — ABNORMAL LOW (ref >90)
GFR, EST AFRICAN AMERICAN: 33 mL/min — AB (ref >90)
Glucose, Bld: 146 mg/dL — ABNORMAL HIGH (ref 70–99)
POTASSIUM: 4.6 meq/L (ref 3.7–5.3)
Sodium: 143 mEq/L (ref 137–147)
Total Bilirubin: 0.5 mg/dL (ref 0.3–1.2)
Total Protein: 6.6 g/dL (ref 6.0–8.3)

## 2013-10-30 LAB — CBC WITH DIFFERENTIAL/PLATELET
BASOS PCT: 1 % (ref 0–1)
Basophils Absolute: 0.1 10*3/uL (ref 0.0–0.1)
Eosinophils Absolute: 0.2 10*3/uL (ref 0.0–0.7)
Eosinophils Relative: 2 % (ref 0–5)
HCT: 30.3 % — ABNORMAL LOW (ref 36.0–46.0)
Hemoglobin: 9.7 g/dL — ABNORMAL LOW (ref 12.0–15.0)
Lymphocytes Relative: 47 % — ABNORMAL HIGH (ref 12–46)
Lymphs Abs: 6 10*3/uL — ABNORMAL HIGH (ref 0.7–4.0)
MCH: 32 pg (ref 26.0–34.0)
MCHC: 32 g/dL (ref 30.0–36.0)
MCV: 100 fL (ref 78.0–100.0)
MONO ABS: 0.4 10*3/uL (ref 0.1–1.0)
Monocytes Relative: 3 % (ref 3–12)
NEUTROS PCT: 47 % (ref 43–77)
Neutro Abs: 5.9 10*3/uL (ref 1.7–7.7)
Platelets: 201 10*3/uL (ref 150–400)
RBC: 3.03 MIL/uL — ABNORMAL LOW (ref 3.87–5.11)
RDW: 14.5 % (ref 11.5–15.5)
WBC: 12.5 10*3/uL — ABNORMAL HIGH (ref 4.0–10.5)

## 2013-10-30 LAB — IRON AND TIBC
Iron: 42 ug/dL (ref 42–135)
Saturation Ratios: 16 % — ABNORMAL LOW (ref 20–55)
TIBC: 270 ug/dL (ref 250–470)
UIBC: 228 ug/dL (ref 125–400)

## 2013-10-30 LAB — TSH: TSH: 3.86 u[IU]/mL (ref 0.350–4.500)

## 2013-10-30 LAB — SEDIMENTATION RATE: Sed Rate: 28 mm/hr — ABNORMAL HIGH (ref 0–22)

## 2013-10-30 LAB — LACTATE DEHYDROGENASE: LDH: 130 U/L (ref 94–250)

## 2013-10-30 LAB — FERRITIN: Ferritin: 176 ng/mL (ref 10–291)

## 2013-10-30 NOTE — Telephone Encounter (Signed)
error 

## 2013-10-30 NOTE — Progress Notes (Signed)
Labs for cmp,cbcd,ldh,iron/tibc,ferr,[id number],esr

## 2013-10-30 NOTE — Progress Notes (Signed)
Paracentesis complete no signs of distress. 3300 ml amber colored ascites removed.

## 2013-10-30 NOTE — Patient Instructions (Signed)
[name] Discharge Instructions  RECOMMENDATIONS MADE BY THE CONSULTANT AND ANY TEST RESULTS WILL BE SENT TO YOUR REFERRING PHYSICIAN.  EXAM FINDINGS BY THE PHYSICIAN TODAY AND SIGNS OR SYMPTOMS TO REPORT TO CLINIC OR PRIMARY PHYSICIAN: Exam and findings as discussed by [name], PA-C.  Your labs were good but your tummy is larger.  We are going to do an ultrasound of your abdomen and withdraw fluid if needed.  MEDICATIONS PRESCRIBED:  none  INSTRUCTIONS/FOLLOW-UP: Follow-up in 1 - 2 weeks.  Thank you for choosing [facility] to provide your oncology and hematology care.  To afford each patient quality time with our providers, please arrive at least 15 minutes before your scheduled appointment time.  With your help, our goal is to use those 15 minutes to complete the necessary work-up to ensure our physicians have the information they need to help with your evaluation and healthcare recommendations.    Effective January 1st, 2014, we ask that you re-schedule your appointment with our physicians should you arrive 10 or more minutes late for your appointment.  We strive to give you quality time with our providers, and arriving late affects you and other patients whose appointments are after yours.    Again, thank you for choosing [facility].  Our hope is that these requests will decrease the amount of time that you wait before being seen by our physicians.       _____________________________________________________________  Should you have questions after your visit to [facility], please contact our office at [phone] between the hours of 8:30 a.m. and 4:30 p.m.  Voicemails left after 4:30 p.m. will not be returned until the following business day.  For prescription refill requests, have your pharmacy contact our office with your prescription refill request.     _______________________________________________________________  We hope that we have given you very good care.  You may receive a patient satisfaction survey in the mail, please complete it and return it as soon as possible.  We value your feedback!  _______________________________________________________________  Have you asked about our STAR program?  STAR stands for Survivorship Training and Rehabilitation, and this is a nationally recognized cancer care program that focuses on survivorship and rehabilitation.  Cancer and cancer treatments may cause problems, such as, pain, making you feel tired and keeping you from doing the things that you need or want to do. Cancer rehabilitation can help. Our goal is to reduce these troubling effects and help you have the best quality of life possible.  You may receive a survey from a nurse that asks questions about your current state of health.  Based on the survey results, all eligible patients will be referred to the [facility] program for an evaluation so we can better serve you!  A frequently asked questions sheet is available upon request.

## 2013-10-30 NOTE — Procedures (Signed)
PreOperative Dx: Abdominal distention, ascites Postoperative Dx: Abdominal distention, ascites Procedure:   US guided paracentesis Radiologist:  Thornton Papas Anesthesia:  9 ml of 1% lidocaine Specimen:  3300 ml of amber colored ascitic fluid EBL:   2-3 ml Complications: None - hemostasis by manual compression at conclusion of procedure

## 2013-11-01 ENCOUNTER — Ambulatory Visit (INDEPENDENT_AMBULATORY_CARE_PROVIDER_SITE_OTHER): Payer: Medicare Other | Admitting: *Deleted

## 2013-11-01 ENCOUNTER — Other Ambulatory Visit: Payer: Self-pay | Admitting: Cardiovascular Disease

## 2013-11-01 DIAGNOSIS — I495 Sick sinus syndrome: Secondary | ICD-10-CM

## 2013-11-01 DIAGNOSIS — Z5181 Encounter for therapeutic drug level monitoring: Secondary | ICD-10-CM

## 2013-11-01 DIAGNOSIS — I4891 Unspecified atrial fibrillation: Secondary | ICD-10-CM

## 2013-11-01 DIAGNOSIS — Z7901 Long term (current) use of anticoagulants: Secondary | ICD-10-CM

## 2013-11-01 DIAGNOSIS — I480 Paroxysmal atrial fibrillation: Secondary | ICD-10-CM

## 2013-11-01 LAB — BETA 2 MICROGLOBULIN, SERUM: BETA 2 MICROGLOBULIN: 9.53 mg/L — AB (ref <=2.51)

## 2013-11-01 LAB — POCT INR: INR: 2

## 2013-11-06 ENCOUNTER — Encounter (HOSPITAL_COMMUNITY): Payer: Self-pay | Admitting: Emergency Medicine

## 2013-11-06 ENCOUNTER — Emergency Department (HOSPITAL_COMMUNITY): Payer: Medicare Other

## 2013-11-06 ENCOUNTER — Other Ambulatory Visit (INDEPENDENT_AMBULATORY_CARE_PROVIDER_SITE_OTHER): Payer: Self-pay | Admitting: Internal Medicine

## 2013-11-06 ENCOUNTER — Encounter (INDEPENDENT_AMBULATORY_CARE_PROVIDER_SITE_OTHER): Payer: Self-pay | Admitting: Internal Medicine

## 2013-11-06 ENCOUNTER — Ambulatory Visit (INDEPENDENT_AMBULATORY_CARE_PROVIDER_SITE_OTHER): Payer: Medicare Other | Admitting: Internal Medicine

## 2013-11-06 ENCOUNTER — Inpatient Hospital Stay (HOSPITAL_COMMUNITY)
Admission: EM | Admit: 2013-11-06 | Discharge: 2013-11-14 | DRG: 281 | Disposition: A | Discharge status: Home Health | Payer: Medicare Other | Attending: Pulmonary Disease | Admitting: Pulmonary Disease

## 2013-11-06 VITALS — BP 112/60 | HR 64 | Temp 97.2°F | Resp 16

## 2013-11-06 DIAGNOSIS — Z9119 Patient's noncompliance with other medical treatment and regimen: Secondary | ICD-10-CM

## 2013-11-06 DIAGNOSIS — I5023 Acute on chronic systolic (congestive) heart failure: Secondary | ICD-10-CM

## 2013-11-06 DIAGNOSIS — Z8249 Family history of ischemic heart disease and other diseases of the circulatory system: Secondary | ICD-10-CM

## 2013-11-06 DIAGNOSIS — I080 Rheumatic disorders of both mitral and aortic valves: Secondary | ICD-10-CM | POA: Diagnosis present

## 2013-11-06 DIAGNOSIS — D649 Anemia, unspecified: Secondary | ICD-10-CM

## 2013-11-06 DIAGNOSIS — K5900 Constipation, unspecified: Secondary | ICD-10-CM | POA: Diagnosis present

## 2013-11-06 DIAGNOSIS — K7469 Other cirrhosis of liver: Secondary | ICD-10-CM

## 2013-11-06 DIAGNOSIS — R079 Chest pain, unspecified: Secondary | ICD-10-CM | POA: Diagnosis present

## 2013-11-06 DIAGNOSIS — K625 Hemorrhage of anus and rectum: Secondary | ICD-10-CM | POA: Diagnosis present

## 2013-11-06 DIAGNOSIS — N184 Chronic kidney disease, stage 4 (severe): Secondary | ICD-10-CM

## 2013-11-06 DIAGNOSIS — N183 Chronic kidney disease, stage 3 unspecified: Secondary | ICD-10-CM | POA: Diagnosis present

## 2013-11-06 DIAGNOSIS — I509 Heart failure, unspecified: Secondary | ICD-10-CM | POA: Diagnosis present

## 2013-11-06 DIAGNOSIS — Z66 Do not resuscitate: Secondary | ICD-10-CM | POA: Diagnosis present

## 2013-11-06 DIAGNOSIS — N179 Acute kidney failure, unspecified: Secondary | ICD-10-CM

## 2013-11-06 DIAGNOSIS — Z5181 Encounter for therapeutic drug level monitoring: Secondary | ICD-10-CM

## 2013-11-06 DIAGNOSIS — I5043 Acute on chronic combined systolic (congestive) and diastolic (congestive) heart failure: Secondary | ICD-10-CM | POA: Diagnosis not present

## 2013-11-06 DIAGNOSIS — L719 Rosacea, unspecified: Secondary | ICD-10-CM | POA: Diagnosis present

## 2013-11-06 DIAGNOSIS — K589 Irritable bowel syndrome without diarrhea: Secondary | ICD-10-CM

## 2013-11-06 DIAGNOSIS — M1990 Unspecified osteoarthritis, unspecified site: Secondary | ICD-10-CM | POA: Diagnosis present

## 2013-11-06 DIAGNOSIS — K219 Gastro-esophageal reflux disease without esophagitis: Secondary | ICD-10-CM | POA: Diagnosis present

## 2013-11-06 DIAGNOSIS — D1330 Benign neoplasm of unspecified part of small intestine: Secondary | ICD-10-CM

## 2013-11-06 DIAGNOSIS — I209 Angina pectoris, unspecified: Secondary | ICD-10-CM

## 2013-11-06 DIAGNOSIS — I214 Non-ST elevation (NSTEMI) myocardial infarction: Secondary | ICD-10-CM

## 2013-11-06 DIAGNOSIS — Z950 Presence of cardiac pacemaker: Secondary | ICD-10-CM

## 2013-11-06 DIAGNOSIS — Z833 Family history of diabetes mellitus: Secondary | ICD-10-CM

## 2013-11-06 DIAGNOSIS — I9589 Other hypotension: Secondary | ICD-10-CM

## 2013-11-06 DIAGNOSIS — Z8719 Personal history of other diseases of the digestive system: Secondary | ICD-10-CM

## 2013-11-06 DIAGNOSIS — K7460 Unspecified cirrhosis of liver: Secondary | ICD-10-CM

## 2013-11-06 DIAGNOSIS — M542 Cervicalgia: Secondary | ICD-10-CM | POA: Diagnosis present

## 2013-11-06 DIAGNOSIS — Z91199 Patient's noncompliance with other medical treatment and regimen due to unspecified reason: Secondary | ICD-10-CM

## 2013-11-06 DIAGNOSIS — Z7901 Long term (current) use of anticoagulants: Secondary | ICD-10-CM

## 2013-11-06 DIAGNOSIS — C9110 Chronic lymphocytic leukemia of B-cell type not having achieved remission: Secondary | ICD-10-CM

## 2013-11-06 DIAGNOSIS — I350 Nonrheumatic aortic (valve) stenosis: Secondary | ICD-10-CM

## 2013-11-06 DIAGNOSIS — R188 Other ascites: Secondary | ICD-10-CM | POA: Diagnosis present

## 2013-11-06 DIAGNOSIS — I959 Hypotension, unspecified: Secondary | ICD-10-CM | POA: Diagnosis not present

## 2013-11-06 DIAGNOSIS — I129 Hypertensive chronic kidney disease with stage 1 through stage 4 chronic kidney disease, or unspecified chronic kidney disease: Secondary | ICD-10-CM | POA: Diagnosis present

## 2013-11-06 DIAGNOSIS — D132 Benign neoplasm of duodenum: Secondary | ICD-10-CM

## 2013-11-06 DIAGNOSIS — I5033 Acute on chronic diastolic (congestive) heart failure: Secondary | ICD-10-CM

## 2013-11-06 DIAGNOSIS — G8929 Other chronic pain: Secondary | ICD-10-CM | POA: Diagnosis present

## 2013-11-06 DIAGNOSIS — I340 Nonrheumatic mitral (valve) insufficiency: Secondary | ICD-10-CM

## 2013-11-06 DIAGNOSIS — M549 Dorsalgia, unspecified: Secondary | ICD-10-CM | POA: Diagnosis present

## 2013-11-06 DIAGNOSIS — N189 Chronic kidney disease, unspecified: Secondary | ICD-10-CM

## 2013-11-06 DIAGNOSIS — I495 Sick sinus syndrome: Secondary | ICD-10-CM

## 2013-11-06 DIAGNOSIS — E785 Hyperlipidemia, unspecified: Secondary | ICD-10-CM

## 2013-11-06 DIAGNOSIS — I10 Essential (primary) hypertension: Secondary | ICD-10-CM | POA: Diagnosis present

## 2013-11-06 DIAGNOSIS — R0789 Other chest pain: Secondary | ICD-10-CM

## 2013-11-06 DIAGNOSIS — R0602 Shortness of breath: Secondary | ICD-10-CM | POA: Diagnosis not present

## 2013-11-06 DIAGNOSIS — Z9089 Acquired absence of other organs: Secondary | ICD-10-CM

## 2013-11-06 DIAGNOSIS — I480 Paroxysmal atrial fibrillation: Secondary | ICD-10-CM

## 2013-11-06 DIAGNOSIS — I4891 Unspecified atrial fibrillation: Secondary | ICD-10-CM | POA: Diagnosis present

## 2013-11-06 DIAGNOSIS — I2510 Atherosclerotic heart disease of native coronary artery without angina pectoris: Secondary | ICD-10-CM

## 2013-11-06 HISTORY — DX: Chronic kidney disease, stage 3 unspecified: N18.30

## 2013-11-06 HISTORY — DX: Nonrheumatic mitral (valve) insufficiency: I34.0

## 2013-11-06 HISTORY — DX: Chronic kidney disease, stage 3 (moderate): N18.3

## 2013-11-06 HISTORY — DX: Atherosclerotic heart disease of native coronary artery without angina pectoris: I25.10

## 2013-11-06 HISTORY — DX: Chronic diastolic (congestive) heart failure: I50.32

## 2013-11-06 HISTORY — DX: Paroxysmal atrial fibrillation: I48.0

## 2013-11-06 LAB — CBC
HCT: 32.1 % — ABNORMAL LOW (ref 36.0–46.0)
Hemoglobin: 10.3 g/dL — ABNORMAL LOW (ref 12.0–15.0)
MCH: 32 pg (ref 26.0–34.0)
MCHC: 32.1 g/dL (ref 30.0–36.0)
MCV: 99.7 fL (ref 78.0–100.0)
PLATELETS: 205 10*3/uL (ref 150–400)
RBC: 3.22 MIL/uL — ABNORMAL LOW (ref 3.87–5.11)
RDW: 14.6 % (ref 11.5–15.5)
WBC: 15.1 10*3/uL — AB (ref 4.0–10.5)

## 2013-11-06 LAB — COMPREHENSIVE METABOLIC PANEL
ALBUMIN: 3.7 g/dL (ref 3.5–5.2)
ALK PHOS: 159 U/L — AB (ref 39–117)
ALT: 10 U/L (ref 0–35)
AST: 21 U/L (ref 0–37)
Anion gap: 14 (ref 5–15)
BUN: 49 mg/dL — AB (ref 6–23)
CALCIUM: 9 mg/dL (ref 8.4–10.5)
CO2: 24 mEq/L (ref 19–32)
Chloride: 106 mEq/L (ref 96–112)
Creatinine, Ser: 1.26 mg/dL — ABNORMAL HIGH (ref 0.50–1.10)
GFR calc non Af Amer: 35 mL/min — ABNORMAL LOW (ref >90)
GFR, EST AFRICAN AMERICAN: 41 mL/min — AB (ref >90)
Glucose, Bld: 137 mg/dL — ABNORMAL HIGH (ref 70–99)
Potassium: 4.6 mEq/L (ref 3.7–5.3)
SODIUM: 144 meq/L (ref 137–147)
TOTAL PROTEIN: 7 g/dL (ref 6.0–8.3)
Total Bilirubin: 0.4 mg/dL (ref 0.3–1.2)

## 2013-11-06 LAB — PRO B NATRIURETIC PEPTIDE: Pro B Natriuretic peptide (BNP): 4148 pg/mL — ABNORMAL HIGH (ref 0–450)

## 2013-11-06 LAB — TROPONIN I: Troponin I: <0.3 ng/mL (ref <0.30)

## 2013-11-06 MED ORDER — ACETAMINOPHEN 500 MG PO TABS: 500.0000 mg | ORAL_TABLET | Freq: Four times a day (QID) | ORAL | Status: DC

## 2013-11-06 NOTE — Patient Instructions (Signed)
Keep diary as to frequency of rectal bleeding. Physician will call with results of blood tests

## 2013-11-06 NOTE — ED Notes (Signed)
Pt with mid CP for several hours per pt with SOB

## 2013-11-06 NOTE — ED Provider Notes (Signed)
CSN: [id number]     Arrival date & time 11/06/13  2231 History   This chart was scribed for Johnna Acosta, MD, by Neta Ehlers, ED Scribe. This patient was seen in room APA02/APA02 and the patient's care was started at 11:48 PM.  None    Chief Complaint  Patient presents with  . Chest Pain    The history is provided by the patient. No language interpreter was used.   HPI Comments: [name] is a 80 y.o. female, with sick sinus syndrome, aortic stenosis, left bundle branch block, ASCVD, and CHF, who presents to the Emergency Department complaining of chest pain which began several hours ago and which has been associated with SOB which is increased when supine. [name] has a h/o progressive dysemia. [name] reports she is intermittently noncompliant with her diuretic, torsemide; she did not take it today.   [name] reports she was treated by a GI specialist today for rectal bleeding which has been persistent for several years. She had blood work performed at the clinic; there were no invasive procedures performed at the clinic today. Last week she was treated for paracentesis; [name] had 3 liters aspirated. She endorses baseline lower extremity swelling.     Dr. Luan Pulling is her PCP.   Past Medical History  Diagnosis Date  . Sick sinus syndrome 11/2006    paroxysmal atrial fibrillation; dual-chamber pacemaker in 11/2006  . Aortic stenosis     mild  . Left bundle branch block   . Congestive heart failure     Congestive heart failure with normal ejection fraction  . Arteriosclerotic cardiovascular disease (ASCVD)     coronary angio in 10/09:50% left anterior descending; 80% PDA; medical therapy advisedOrthostatic decrease in blood pressure  . Epistaxis     mild with negative ENT evaluation  . Dizziness     chronic  . Osteoarthritis   . Irritable bowel syndrome   . Urinary frequency     with incontinence  . Gastroesophageal reflux disease     Esophageal dilatation for  stricture in 09/2011  . Rosacea   . Chronic lymphocytic leukemia     Stage I with anemia H./H. of 9/27 in 11/09 with normal MCV; iron deficiency in bone marrow  . Orthostatic hypotension   . Chronic anticoagulation   . Diverticulosis   . 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 Chronic kidney disease      Creatinine of 1.7 in 8/08, 1.4 in 10/08 and 1.5 in 11/09  . Urethral caruncle   . Iron deficiency anemia secondary to blood loss (chronic) 01/19/2013  . History of rectal bleeding 01/19/2013   Past Surgical History  Procedure Laterality Date  . Cholecystectomy    . Cervical spine surgery    . Lumbar disc surgery    . Bladder suspension    . Abdominal hysterectomy  1972  . Cataract extraction      Bilateral  . Esophagogastroduodenoscopy  01/09/09    Rourk -noncritical Schatzki's ring, small hiatal hernia, fundal gland polyps, bile-stained gastric mucosa, polypoid lesion straddling D1 and D2 1-2 cm, localized lymphangitic-appearing mucosa at D2 biopsied (adenomatous polyp with high grade glandular dysplasia  . A-v cardiac pacemaker insertion  11/2006    [employer]   Family History  Problem Relation Age of Onset  . Heart failure Father   . Cancer Father   . Diabetes Mother  History  Substance Use Topics  . Smoking status: Never Smoker   . Smokeless tobacco: Never Used  . Alcohol Use: No   OB History   Grav Para Term Preterm Abortions TAB SAB Ect Mult Living   2 2 2       2      Review of Systems  Constitutional: Negative for fever.  Respiratory: Positive for shortness of breath.   Cardiovascular: Positive for leg swelling.  All other systems reviewed and are negative.     Allergies  Vesicare; Detrol; Sanctura; Sulfa antibiotics; Sulfonamide derivatives; and Trimethoprim  Home Medications   Prior to Admission medications   Medication Sig Start Date End Date Taking? Authorizing  Provider  metoprolol tartrate (LOPRESSOR) 25 MG tablet Take 25 mg by mouth 2 (two) times daily.   Yes Historical Provider, MD  verapamil (CALAN-SR) 180 MG CR tablet Take 180 mg by mouth at bedtime.   Yes Historical Provider, MD  warfarin (COUMADIN) 2.5 MG tablet Take 2.5 mg by mouth as directed.   Yes Historical Provider, MD  acetaminophen (TYLENOL) 500 MG tablet Take 1 tablet (500 mg total) by mouth 4 (four) times daily. 11/06/13   Rogene Houston, MD  losartan (COZAAR) 100 MG tablet 100 mg. Take 100 mg by mouth daily.    Historical Provider, MD  metolazone (ZAROXOLYN) 2.5 MG tablet Take 1 tablet (2.5 mg total) by mouth as needed. Take 1 tablet by mouth as needed for leg swelling/SOB 12/14/12   Herminio Commons, MD  Multiple Vitamin (MULTI-VITAMINS) TABS 1 tablet. Take 1 tablet by mouth daily.    Historical Provider, MD  nitroGLYCERIN (NITROSTAT) 0.4 MG SL tablet Place 1 tablet (0.4 mg total) under the tongue every 5 (five) minutes as needed for chest pain. 12/14/12   Herminio Commons, MD  pravastatin (PRAVACHOL) 40 MG tablet 40 mg. Take 40 mg by mouth daily.    Historical Provider, MD  torsemide (DEMADEX) 20 MG tablet 20 mg. Take 20 mg by mouth daily.    Historical Provider, MD   Triage Vitals: BP 154/89  Pulse 122  Temp(Src) 97.4 F (36.3 C) (Oral)  Ht 5' (1.524 m)  Wt 145 lb (65.772 kg)  BMI 28.32 kg/m2  SpO2 94%  Physical Exam  Nursing note and vitals reviewed. Constitutional: She is oriented to person, place, and time. She appears well-developed and well-nourished. No distress.  HENT:  Head: Normocephalic and atraumatic.  Eyes: Conjunctivae and EOM are normal.  Neck: Neck supple. No tracheal deviation present.  Cardiovascular: Normal rate.   Murmur heard.  Systolic murmur is present  Systolic murmur.   Pulmonary/Chest: She is in respiratory distress.  Bilateral rales at bases. Increased RR, tachypnea, speaks in shortened sentences   Abdominal: Soft. There is no tenderness.   Musculoskeletal: Normal range of motion. She exhibits edema.  2+ pitting edema symmetrically. No obvious JVD.  Neurological: She is alert and oriented to person, place, and time.  Skin: Skin is warm and dry.  Psychiatric: She has a normal mood and affect. Her behavior is normal.    ED Course  Procedures (including critical care time)  DIAGNOSTIC STUDIES: Oxygen Saturation is 94% on room air, normal by my interpretation.    COORDINATION OF CARE:  11:53 PM- Discussed treatment plan with patient, and the patient agreed to the plan. The plan lab work, imaging, and most likely+  includes admission.   Labs Review Labs Reviewed  CBC - Abnormal; Notable for the following:  WBC 15.1 (*)    RBC 3.22 (*)    Hemoglobin 10.3 (*)    HCT 32.1 (*)    All other components within normal limits  COMPREHENSIVE METABOLIC PANEL - Abnormal; Notable for the following:    Glucose, Bld 137 (*)    BUN 49 (*)    Creatinine, Ser 1.26 (*)    Alkaline Phosphatase 159 (*)    GFR calc non Af Amer 35 (*)    GFR calc Af Amer 41 (*)    All other components within normal limits  PRO B NATRIURETIC PEPTIDE - Abnormal; Notable for the following:    Pro B Natriuretic peptide (BNP) 4148.0 (*)    All other components within normal limits  TROPONIN I  PROTIME-INR    Imaging Review Dg Chest Port 1 View  11/06/2013   CLINICAL DATA:  Chest pain and shortness breath, history of CHF.  EXAM: PORTABLE CHEST - 1 VIEW  COMPARISON:  CT of the chest Jul 15, 2012 and chest radiograph September 02, 2012  FINDINGS: The cardiac silhouette appears moderate to severely enlarged, which may be accentuated by AP technique. Diffuse interstitial prominence, small to moderate bilateral pleural effusions and bibasilar patchy airspace opacities. Patient's known pulmonary nodules difficult to appreciate on radiography. No pneumothorax.  Dual lead left cardiac pacemaker in situ. Patient is osteopenic. Soft tissue planes are nonsuspicious.   IMPRESSION: Cardiomegaly, interstitial and bibasilar airspace opacities suggest pulmonary edema with small to moderate pleural effusions.   Electronically Signed   By: Elon Alas   On: 11/06/2013 23:21     EKG Interpretation   Date/Time:  Monday November 06 2013 22:39:05 EDT Ventricular Rate:  132 PR Interval:    QRS Duration: 142 QT Interval:  354 QTC Calculation: 525 R Axis:   -70 Text Interpretation:  Atrial fibrillation Left bundle branch block  Baseline wander in lead(s) II III aVF V1 since last tracing no significant  change Confirmed by Pearce Littlefield  MD, Latrenda Irani ([id number]) on 11/06/2013 11:47:33 PM      MDM   Final diagnoses:  Acute on chronic systolic congestive heart failure    Discussed care with Dr. Shanon Brow of the hospitalist service, the patient has congestive heart failure and with progressive swelling, pulmonary edema and tachypnea the patient is likely having an acute on chronic congestive heart failure exacerbation. She has been given Lasix and a low-dose nitro drip as the patient is not hypertensive. She is not febrile, has a normal troponin and her EKG shows a persistent left bundle branch block. She will be admitted to the step down unit.   I personally performed the services described in this documentation, which was scribed in my presence. The recorded information has been reviewed and is accurate.      Johnna Acosta, MD 11/07/13 [phone]

## 2013-11-06 NOTE — ED Notes (Signed)
MD at bedside. 

## 2013-11-07 ENCOUNTER — Encounter (HOSPITAL_COMMUNITY): Payer: Self-pay | Admitting: *Deleted

## 2013-11-07 ENCOUNTER — Inpatient Hospital Stay (HOSPITAL_COMMUNITY): Payer: Medicare Other

## 2013-11-07 DIAGNOSIS — Z91199 Patient's noncompliance with other medical treatment and regimen due to unspecified reason: Secondary | ICD-10-CM | POA: Diagnosis not present

## 2013-11-07 DIAGNOSIS — R188 Other ascites: Secondary | ICD-10-CM

## 2013-11-07 DIAGNOSIS — I359 Nonrheumatic aortic valve disorder, unspecified: Secondary | ICD-10-CM

## 2013-11-07 DIAGNOSIS — K589 Irritable bowel syndrome without diarrhea: Secondary | ICD-10-CM | POA: Diagnosis present

## 2013-11-07 DIAGNOSIS — Z7901 Long term (current) use of anticoagulants: Secondary | ICD-10-CM | POA: Diagnosis not present

## 2013-11-07 DIAGNOSIS — I2510 Atherosclerotic heart disease of native coronary artery without angina pectoris: Secondary | ICD-10-CM

## 2013-11-07 DIAGNOSIS — I495 Sick sinus syndrome: Secondary | ICD-10-CM

## 2013-11-07 DIAGNOSIS — N179 Acute kidney failure, unspecified: Secondary | ICD-10-CM | POA: Diagnosis present

## 2013-11-07 DIAGNOSIS — I509 Heart failure, unspecified: Secondary | ICD-10-CM | POA: Diagnosis present

## 2013-11-07 DIAGNOSIS — K5900 Constipation, unspecified: Secondary | ICD-10-CM | POA: Diagnosis present

## 2013-11-07 DIAGNOSIS — D649 Anemia, unspecified: Secondary | ICD-10-CM | POA: Diagnosis present

## 2013-11-07 DIAGNOSIS — I129 Hypertensive chronic kidney disease with stage 1 through stage 4 chronic kidney disease, or unspecified chronic kidney disease: Secondary | ICD-10-CM | POA: Diagnosis present

## 2013-11-07 DIAGNOSIS — N183 Chronic kidney disease, stage 3 unspecified: Secondary | ICD-10-CM | POA: Diagnosis present

## 2013-11-07 DIAGNOSIS — M542 Cervicalgia: Secondary | ICD-10-CM | POA: Diagnosis present

## 2013-11-07 DIAGNOSIS — R0789 Other chest pain: Secondary | ICD-10-CM

## 2013-11-07 DIAGNOSIS — K7460 Unspecified cirrhosis of liver: Secondary | ICD-10-CM | POA: Diagnosis present

## 2013-11-07 DIAGNOSIS — Z833 Family history of diabetes mellitus: Secondary | ICD-10-CM | POA: Diagnosis not present

## 2013-11-07 DIAGNOSIS — M1990 Unspecified osteoarthritis, unspecified site: Secondary | ICD-10-CM | POA: Diagnosis present

## 2013-11-07 DIAGNOSIS — Z9119 Patient's noncompliance with other medical treatment and regimen: Secondary | ICD-10-CM | POA: Diagnosis not present

## 2013-11-07 DIAGNOSIS — I080 Rheumatic disorders of both mitral and aortic valves: Secondary | ICD-10-CM | POA: Diagnosis present

## 2013-11-07 DIAGNOSIS — I5033 Acute on chronic diastolic (congestive) heart failure: Secondary | ICD-10-CM

## 2013-11-07 DIAGNOSIS — I4891 Unspecified atrial fibrillation: Secondary | ICD-10-CM

## 2013-11-07 DIAGNOSIS — I7090 Unspecified atherosclerosis: Secondary | ICD-10-CM

## 2013-11-07 DIAGNOSIS — I214 Non-ST elevation (NSTEMI) myocardial infarction: Secondary | ICD-10-CM | POA: Diagnosis present

## 2013-11-07 DIAGNOSIS — I5043 Acute on chronic combined systolic (congestive) and diastolic (congestive) heart failure: Secondary | ICD-10-CM | POA: Diagnosis present

## 2013-11-07 DIAGNOSIS — Z950 Presence of cardiac pacemaker: Secondary | ICD-10-CM | POA: Diagnosis not present

## 2013-11-07 DIAGNOSIS — L719 Rosacea, unspecified: Secondary | ICD-10-CM | POA: Diagnosis present

## 2013-11-07 DIAGNOSIS — K625 Hemorrhage of anus and rectum: Secondary | ICD-10-CM

## 2013-11-07 DIAGNOSIS — Z8249 Family history of ischemic heart disease and other diseases of the circulatory system: Secondary | ICD-10-CM | POA: Diagnosis not present

## 2013-11-07 DIAGNOSIS — G8929 Other chronic pain: Secondary | ICD-10-CM | POA: Diagnosis present

## 2013-11-07 DIAGNOSIS — M549 Dorsalgia, unspecified: Secondary | ICD-10-CM | POA: Diagnosis present

## 2013-11-07 DIAGNOSIS — Z9089 Acquired absence of other organs: Secondary | ICD-10-CM | POA: Diagnosis not present

## 2013-11-07 DIAGNOSIS — K219 Gastro-esophageal reflux disease without esophagitis: Secondary | ICD-10-CM | POA: Diagnosis present

## 2013-11-07 DIAGNOSIS — Z66 Do not resuscitate: Secondary | ICD-10-CM | POA: Diagnosis present

## 2013-11-07 DIAGNOSIS — C9110 Chronic lymphocytic leukemia of B-cell type not having achieved remission: Secondary | ICD-10-CM | POA: Diagnosis present

## 2013-11-07 DIAGNOSIS — I959 Hypotension, unspecified: Secondary | ICD-10-CM | POA: Diagnosis not present

## 2013-11-07 DIAGNOSIS — N189 Chronic kidney disease, unspecified: Secondary | ICD-10-CM

## 2013-11-07 DIAGNOSIS — R079 Chest pain, unspecified: Secondary | ICD-10-CM | POA: Diagnosis present

## 2013-11-07 DIAGNOSIS — R0602 Shortness of breath: Secondary | ICD-10-CM | POA: Diagnosis present

## 2013-11-07 LAB — MITOCHONDRIAL ANTIBODIES: MITOCHONDRIAL M2 AB, IGG: 0.02 (ref <0.91)

## 2013-11-07 LAB — PROTIME-INR
INR: 2.17 — AB (ref 0.00–1.49)
INR: 2.32 — AB (ref 0.00–1.49)
PROTHROMBIN TIME: 25.5 s — AB (ref 11.6–15.2)
Prothrombin Time: 24.2 seconds — ABNORMAL HIGH (ref 11.6–15.2)

## 2013-11-07 LAB — TROPONIN I
TROPONIN I: 0.93 ng/mL — AB (ref <0.30)
Troponin I: <0.3 ng/mL (ref <0.30)
Troponin I: 0.8 ng/mL (ref <0.30)

## 2013-11-07 LAB — HEPATITIS C ANTIBODY: HCV AB: NEGATIVE

## 2013-11-07 LAB — MRSA PCR SCREENING: MRSA by PCR: NEGATIVE

## 2013-11-07 LAB — ANA: Anti Nuclear Antibody(ANA): NEGATIVE

## 2013-11-07 MED ORDER — SODIUM CHLORIDE 0.9 % IJ SOLN
3.0000 mL | Freq: Two times a day (BID) | INTRAMUSCULAR | Status: DC
  Administered 2013-11-07 – 2013-11-14 (×14): 3 mL via INTRAVENOUS

## 2013-11-07 MED ORDER — ACETAMINOPHEN 325 MG PO TABS
325.0000 mg | ORAL_TABLET | ORAL | Status: DC | PRN
  Administered 2013-11-07 – 2013-11-08 (×4): 325 mg via ORAL
  Filled 2013-11-07 (×4): qty 1

## 2013-11-07 MED ORDER — FUROSEMIDE 10 MG/ML IJ SOLN
40.0000 mg | INTRAMUSCULAR | Status: AC
  Administered 2013-11-07: 40 mg via INTRAVENOUS
  Filled 2013-11-07: qty 4

## 2013-11-07 MED ORDER — WARFARIN - PHYSICIAN DOSING INPATIENT: Freq: Every day | Status: DC

## 2013-11-07 MED ORDER — ACETAMINOPHEN 325 MG PO TABS
650.0000 mg | ORAL_TABLET | ORAL | Status: DC | PRN
  Filled 2013-11-07: qty 2

## 2013-11-07 MED ORDER — ALBUTEROL SULFATE (2.5 MG/3ML) 0.083% IN NEBU
2.5000 mg | INHALATION_SOLUTION | RESPIRATORY_TRACT | Status: DC | PRN
  Administered 2013-11-07 – 2013-11-09 (×2): 2.5 mg via RESPIRATORY_TRACT
  Filled 2013-11-07 (×2): qty 3

## 2013-11-07 MED ORDER — ASPIRIN EC 81 MG PO TBEC
81.0000 mg | DELAYED_RELEASE_TABLET | Freq: Every day | ORAL | Status: DC
  Administered 2013-11-07 – 2013-11-14 (×8): 81 mg via ORAL
  Filled 2013-11-07 (×9): qty 1

## 2013-11-07 MED ORDER — WARFARIN SODIUM 2.5 MG PO TABS: 2.5000 mg | ORAL_TABLET | Freq: Every day | ORAL | Status: DC

## 2013-11-07 MED ORDER — NITROGLYCERIN IN D5W 200-5 MCG/ML-% IV SOLN
5.0000 ug/min | Freq: Once | INTRAVENOUS | Status: AC
  Administered 2013-11-07: 5 ug/min via INTRAVENOUS
  Filled 2013-11-07: qty 250

## 2013-11-07 MED ORDER — SODIUM CHLORIDE 0.9 % IV SOLN
250.0000 mL | INTRAVENOUS | Status: DC | PRN
Start: 2013-11-07 — End: 2013-11-14

## 2013-11-07 MED ORDER — SODIUM CHLORIDE 0.9 % IJ SOLN: 3.0000 mL | INTRAMUSCULAR | Status: DC | PRN

## 2013-11-07 MED ORDER — METOPROLOL TARTRATE 25 MG PO TABS
25.0000 mg | ORAL_TABLET | Freq: Two times a day (BID) | ORAL | Status: DC
  Administered 2013-11-07 – 2013-11-14 (×15): 25 mg via ORAL
  Filled 2013-11-07 (×15): qty 1

## 2013-11-07 MED ORDER — FUROSEMIDE 10 MG/ML IJ SOLN
40.0000 mg | Freq: Two times a day (BID) | INTRAMUSCULAR | Status: DC
  Administered 2013-11-07 – 2013-11-08 (×4): 40 mg via INTRAVENOUS
  Filled 2013-11-07 (×4): qty 4

## 2013-11-07 MED ORDER — CETYLPYRIDINIUM CHLORIDE 0.05 % MT LIQD
7.0000 mL | Freq: Two times a day (BID) | OROMUCOSAL | Status: DC
  Administered 2013-11-07 – 2013-11-14 (×15): 7 mL via OROMUCOSAL

## 2013-11-07 MED ORDER — NITROGLYCERIN IN D5W 200-5 MCG/ML-% IV SOLN: 2.0000 ug/min | INTRAVENOUS | Status: DC

## 2013-11-07 MED ORDER — ONDANSETRON HCL 4 MG/2ML IJ SOLN
4.0000 mg | Freq: Four times a day (QID) | INTRAMUSCULAR | Status: DC | PRN
  Administered 2013-11-07: 4 mg via INTRAVENOUS

## 2013-11-07 MED ORDER — METOLAZONE 5 MG PO TABS
2.5000 mg | ORAL_TABLET | Freq: Every day | ORAL | Status: DC
  Administered 2013-11-07 – 2013-11-08 (×2): 2.5 mg via ORAL
  Filled 2013-11-07 (×2): qty 1

## 2013-11-07 MED ORDER — VERAPAMIL HCL ER 180 MG PO TBCR
180.0000 mg | EXTENDED_RELEASE_TABLET | Freq: Every day | ORAL | Status: DC
  Administered 2013-11-07 – 2013-11-13 (×7): 180 mg via ORAL
  Filled 2013-11-07 (×8): qty 1

## 2013-11-07 MED ORDER — LOSARTAN POTASSIUM 50 MG PO TABS
100.0000 mg | ORAL_TABLET | Freq: Every day | ORAL | Status: DC
  Administered 2013-11-07 – 2013-11-08 (×2): 100 mg via ORAL
  Filled 2013-11-07 (×2): qty 2

## 2013-11-07 MED ORDER — ONDANSETRON HCL 4 MG/2ML IJ SOLN
4.0000 mg | Freq: Three times a day (TID) | INTRAMUSCULAR | Status: AC | PRN
Start: 2013-11-07 — End: 2013-11-07
  Filled 2013-11-07: qty 2

## 2013-11-07 MED ORDER — HYDROMORPHONE HCL PF 1 MG/ML IJ SOLN
0.5000 mg | Freq: Once | INTRAMUSCULAR | Status: AC
  Administered 2013-11-07: 0.5 mg via INTRAVENOUS
  Filled 2013-11-07: qty 1

## 2013-11-07 NOTE — H&P (Signed)
PCP:   HAWKINS,EDWARD Carlean Jews, MD   Chief Complaint:  sob  HPI: 80 yo female h/o diastolic chf, moderate aortic stenosis, cll comes in with several days of worsening sob.  Pt had an outpt paracentesis done last week for ascites and 3 liters was drained off (i dont see note from GI about this) and went to see gi today for f/u on her chronic rectal bleeding.  She went home after that appt, and got very sob and started having some sscp.  No fevers.  No cough.  Pt does not like to take her diuretics as prescribed because they make her urinate a lot.  She has been having worsening swelling in her legs and abdomenal area for weeks.  She has not been able to lie down to sleep.  Has been having pnd and orthopnea for days.  Her son is with her too, and says she really does not watch her salt intake much either.    Review of Systems:  Positive and negative as per HPI otherwise all other systems are negative  Past Medical History: Past Medical History  Diagnosis Date  . Sick sinus syndrome 11/2006    paroxysmal atrial fibrillation; dual-chamber pacemaker in 11/2006  . Aortic stenosis     mild  . Left bundle branch block   . Congestive heart failure     Congestive heart failure with normal ejection fraction  . Arteriosclerotic cardiovascular disease (ASCVD)     coronary angio in 10/09:50% left anterior descending; 80% PDA; medical therapy advisedOrthostatic decrease in blood pressure  . Epistaxis     mild with negative ENT evaluation  . Dizziness     chronic  . Osteoarthritis   . Irritable bowel syndrome   . Urinary frequency     with incontinence  . Gastroesophageal reflux disease     Esophageal dilatation for stricture in 09/2011  . Rosacea   . Chronic lymphocytic leukemia     Stage I with anemia H./H. of 9/27 in 11/09 with normal MCV; iron deficiency in bone marrow  . Orthostatic hypotension   . Chronic anticoagulation   . Diverticulosis   . Adenomatous polyp October 2010    (Proximal Small  bowel) With high grade dysplasia in polypoid lesion straddling D1/D2  . Chronic kidney disease      Creatinine of 1.7 in 8/08, 1.4 in 10/08 and 1.5 in 11/09  . Urethral caruncle   . Iron deficiency anemia secondary to blood loss (chronic) 01/19/2013  . History of rectal bleeding 01/19/2013   Past Surgical History  Procedure Laterality Date  . Cholecystectomy    . Cervical spine surgery    . Lumbar disc surgery    . Bladder suspension    . Abdominal hysterectomy  1972  . Cataract extraction      Bilateral  . Esophagogastroduodenoscopy  01/09/09    Rourk -noncritical Schatzki's ring, small hiatal hernia, fundal gland polyps, bile-stained gastric mucosa, polypoid lesion straddling D1 and D2 1-2 cm, localized lymphangitic-appearing mucosa at D2 biopsied (adenomatous polyp with high grade glandular dysplasia  . A-v cardiac pacemaker insertion  11/2006    [employer]    Medications: Prior to Admission medications   Medication Sig Start Date End Date Taking? Authorizing Provider  metoprolol tartrate (LOPRESSOR) 25 MG tablet Take 25 mg by mouth 2 (two) times daily.   Yes Historical Provider, MD  verapamil (CALAN-SR) 180 MG CR tablet Take 180 mg by mouth at bedtime.   Yes Historical Provider, MD  warfarin (  COUMADIN) 2.5 MG tablet Take 2.5 mg by mouth as directed.   Yes Historical Provider, MD  acetaminophen (TYLENOL) 500 MG tablet Take 1 tablet (500 mg total) by mouth 4 (four) times daily. 11/06/13   Rogene Houston, MD  losartan (COZAAR) 100 MG tablet 100 mg. Take 100 mg by mouth daily.    Historical Provider, MD  metolazone (ZAROXOLYN) 2.5 MG tablet Take 1 tablet (2.5 mg total) by mouth as needed. Take 1 tablet by mouth as needed for leg swelling/SOB 12/14/12   Herminio Commons, MD  Multiple Vitamin (MULTI-VITAMINS) TABS 1 tablet. Take 1 tablet by mouth daily.    Historical Provider, MD  nitroGLYCERIN (NITROSTAT) 0.4 MG SL tablet Place 1 tablet (0.4 mg total) under the tongue every 5 (five)  minutes as needed for chest pain. 12/14/12   Herminio Commons, MD  pravastatin (PRAVACHOL) 40 MG tablet 40 mg. Take 40 mg by mouth daily.    Historical Provider, MD  torsemide (DEMADEX) 20 MG tablet 20 mg. Take 20 mg by mouth daily.    Historical Provider, MD    Allergies:   Allergies  Allergen Reactions  . Vesicare [Solifenacin] Other (See Comments)    Severe stomach pain  . Detrol [Tolterodine Tartrate] Other (See Comments)    Patient states: "I was told to never take it again, I do not recall the reaction"  . [place] [Trospium Chloride] Nausea Only and Other (See Comments)    Intense lower abd pain  . Sulfa Antibiotics Other (See Comments)    Extreme dizziness  . Sulfonamide Derivatives Other (See Comments)    REACTION: GI distress  . Trimethoprim     Other reaction(s): Dermatitis    Social History:  reports that she has never smoked. She has never used smokeless tobacco. She reports that she does not drink alcohol or use illicit drugs.  Family History: Family History  Problem Relation Age of Onset  . Heart failure Father   . Cancer Father   . Diabetes Mother     Physical Exam: Filed Vitals:   11/06/13 2237 11/06/13 2300  BP: 154/89 163/87  Pulse: 122 95  Temp: 97.4 F (36.3 C)   TempSrc: Oral   Resp:  28  Height: 5' (1.524 m)   Weight: 65.772 kg (145 lb)   SpO2: 94% 96%   General appearance: alert, cooperative and mild distress Head: Normocephalic, without obvious abnormality, atraumatic Eyes: negative Nose: Nares normal. Septum midline. Mucosa normal. No drainage or sinus tenderness. Neck: no JVD and supple, symmetrical, trachea midline Lungs: clear to auscultation bilaterally Heart: regular rate and rhythm and 4/5 sem Abdomen: soft distended good bs nt no r/g nonacute abd Extremities: edema 2-3 + Pulses: 2+ and symmetric Skin: Skin color, texture, turgor normal. No rashes or lesions Neurologic: Grossly normal    Labs on Admission:   Recent  Labs  11/06/13 2247  NA 144  K 4.6  CL 106  CO2 24  GLUCOSE 137*  BUN 49*  CREATININE 1.26*  CALCIUM 9.0    Recent Labs  11/06/13 2247  AST 21  ALT 10  ALKPHOS 159*  BILITOT 0.4  PROT 7.0  ALBUMIN 3.7    Recent Labs  11/06/13 2247  WBC 15.1*  HGB 10.3*  HCT 32.1*  MCV 99.7  PLT 205    Recent Labs  11/06/13 2247  TROPONINI <0.30   Radiological Exams on Admission: Dg Chest Port 1 View  11/06/2013   CLINICAL DATA:  Chest pain and  shortness breath, history of CHF.  EXAM: PORTABLE CHEST - 1 VIEW  COMPARISON:  CT of the chest Jul 15, 2012 and chest radiograph September 02, 2012  FINDINGS: The cardiac silhouette appears moderate to severely enlarged, which may be accentuated by AP technique. Diffuse interstitial prominence, small to moderate bilateral pleural effusions and bibasilar patchy airspace opacities. Patient's known pulmonary nodules difficult to appreciate on radiography. No pneumothorax.  Dual lead left cardiac pacemaker in situ. Patient is osteopenic. Soft tissue planes are nonsuspicious.  IMPRESSION: Cardiomegaly, interstitial and bibasilar airspace opacities suggest pulmonary edema with small to moderate pleural effusions.   Electronically Signed   By: Elon Alas   On: 11/06/2013 23:21    Assessment/Plan  80 yo female with acute on chronic diastolic chf  Principal Problem:   Acute on chronic diastolic congestive heart failure-  Mostly due to indescretion with salt intake and noncompliance with diuretics at home.  Could be due to progression of her AS, which was mild to moderate less than a year ago.  She has been seeing GI and it sounds like her ascites is not related to her liver, but this is unclear.  Will obtain gi and cards consult to optimize her medication treatment.  This all appears to be heart related however.  Cont ntg gtt.  Serial cardiac enzymes.  Order abd u/s for possible repeat paracentesis.  Active Problems:  Stable unless o/w noted.    Arteriosclerotic cardiovascular disease (ASCVD)   Chronic anticoagulation   Aortic stenosis    Chronic kidney disease   Gastroesophageal reflux disease   Chronic lymphocytic leukemia   Paroxysmal atrial fibrillation   Pacemaker   Chest pain  Pt wishes to be full code but only for a couple days if no improvement, son present for this conversation, he is her NOK.  Admit to stepdown.  chf pathway.  She should improve with diuretic use.   Place foley.  Brittnae Aschenbrenner A 11/07/2013, 12:11 AM

## 2013-11-07 NOTE — Progress Notes (Signed)
Patient is a very pleasant 80 year old Caucasian female whom I saw in the office yesterday for rectal bleeding and new onset of ascites. Please refer to my note from yesterday for details. Patient developed chest pain and dyspnea after evening meal around 6 PM and she took nitroglycerin x3 about 45-60 minutes apart and did not feel better. She was evaluated in the emergency room and subsequently hospitalized. She is now pain-free and breathing better. Patient's troponin levels are elevated. She has not had bowel movement or rectal bleeding in the last 24 hours. Patient's H&H is low but stable. Lab studies that were artery yesterday are pending. Patient will need endoscopic evaluation at later date when acute issues have resolved and she is deemed to be stable from cardiac and pulmonary standpoint.

## 2013-11-07 NOTE — Progress Notes (Signed)
Subjective: She was admitted with congestive heart failure. She had seen Dr. Laural [name] at his office yesterday. She then developed chest discomfort and symptoms of heart failure. Chest x-ray showed pulmonary edema and she was started on nitroglycerin. Troponin this morning is 0.8. She says she feels somewhat better.  Objective: Vital signs in last 24 hours: Temp:  [97.2 F (36.2 C)-98.1 F (36.7 C)] 97.7 F (36.5 C) (08/25 0400) Pulse Rate:  [63-152] 81 (08/25 0815) Resp:  [16-37] 21 (08/25 0815) BP: (90-186)/(44-89) 107/51 mmHg (08/25 0815) SpO2:  [92 %-97 %] 96 % (08/25 0815) Weight:  [63.6 kg (140 lb 3.4 oz)-65.772 kg (145 lb)] 63.6 kg (140 lb 3.4 oz) (08/25 0207) Weight change:  Last BM Date: 11/07/13  Intake/Output from previous day: 08/24 0701 - 08/25 0700 In: 30.1 [I.V.:30.1] Out: 950 [Urine:950]  PHYSICAL EXAM General appearance: alert, cooperative and mild distress Resp: rales bibasilar Cardio: Her heart is somewhat irregular. I do not hear a definite S3 gallop GI: Her liver is palpable Extremities: She has 2+ edema  Lab Results:  Results for orders placed during the hospital encounter of 11/06/13 (from the past 48 hour(s))  CBC     Status: Abnormal   Collection Time    11/06/13 10:47 PM      Result Value Ref Range   WBC 15.1 (*) 4.0 - 10.5 K/uL   RBC 3.22 (*) 3.87 - 5.11 MIL/uL   Hemoglobin 10.3 (*) 12.0 - 15.0 g/dL   HCT 32.1 (*) 36.0 - 46.0 %   MCV 99.7  78.0 - 100.0 fL   MCH 32.0  26.0 - 34.0 pg   MCHC 32.1  30.0 - 36.0 g/dL   RDW 14.6  11.5 - 15.5 %   Platelets 205  150 - 400 K/uL  TROPONIN I     Status: None   Collection Time    11/06/13 10:47 PM      Result Value Ref Range   Troponin I <0.30  <0.30 ng/mL   Comment:            Due to the release kinetics of cTnI,     a negative result within the first hours     of the onset of symptoms does not rule out     myocardial infarction with certainty.     If myocardial infarction is still suspected,   repeat the test at appropriate intervals.  COMPREHENSIVE METABOLIC PANEL     Status: Abnormal   Collection Time    11/06/13 10:47 PM      Result Value Ref Range   Sodium 144  137 - 147 mEq/L   Potassium 4.6  3.7 - 5.3 mEq/L   Chloride 106  96 - 112 mEq/L   CO2 24  19 - 32 mEq/L   Glucose, Bld 137 (*) 70 - 99 mg/dL   BUN 49 (*) 6 - 23 mg/dL   Creatinine, Ser 1.26 (*) 0.50 - 1.10 mg/dL   Calcium 9.0  8.4 - 10.5 mg/dL   Total Protein 7.0  6.0 - 8.3 g/dL   Albumin 3.7  3.5 - 5.2 g/dL   AST 21  0 - 37 U/L   ALT 10  0 - 35 U/L   Alkaline Phosphatase 159 (*) 39 - 117 U/L   Total Bilirubin 0.4  0.3 - 1.2 mg/dL   GFR calc non Af Amer 35 (*) >90 mL/min   GFR calc Af Amer 41 (*) >90 mL/min   Comment: (NOTE)  The eGFR has been calculated using the CKD EPI equation.     This calculation has not been validated in all clinical situations.     eGFR's persistently <90 mL/min signify possible Chronic Kidney     Disease.   Anion gap 14  5 - 15  PRO B NATRIURETIC PEPTIDE     Status: Abnormal   Collection Time    11/06/13 10:47 PM      Result Value Ref Range   Pro B Natriuretic peptide (BNP) 4148.0 (*) 0 - 450 pg/mL  PROTIME-INR     Status: Abnormal   Collection Time    11/07/13 12:09 AM      Result Value Ref Range   Prothrombin Time 25.5 (*) 11.6 - 15.2 seconds   INR 2.32 (*) 0.00 - 1.49  MRSA PCR SCREENING     Status: None   Collection Time    11/07/13  1:10 AM      Result Value Ref Range   MRSA by PCR NEGATIVE  NEGATIVE   Comment:            The GeneXpert MRSA Assay (FDA     approved for NASAL specimens     only), is one component of a     comprehensive MRSA colonization     surveillance program. It is not     intended to diagnose MRSA     infection nor to guide or     monitor treatment for     MRSA infections.  TROPONIN I     Status: None   Collection Time    11/07/13  2:14 AM      Result Value Ref Range   Troponin I <0.30  <0.30 ng/mL   Comment:            Due to the  release kinetics of cTnI,     a negative result within the first hours     of the onset of symptoms does not rule out     myocardial infarction with certainty.     If myocardial infarction is still suspected,     repeat the test at appropriate intervals.  PROTIME-INR     Status: Abnormal   Collection Time    11/07/13  7:30 AM      Result Value Ref Range   Prothrombin Time 24.2 (*) 11.6 - 15.2 seconds   INR 2.17 (*) 0.00 - 1.49  TROPONIN I     Status: Abnormal   Collection Time    11/07/13  7:30 AM      Result Value Ref Range   Troponin I 0.80 (*) <0.30 ng/mL   Comment: CRITICAL RESULT CALLED TO, READ BACK BY AND VERIFIED WITH:     [name] AT 8:15AM ON 11/07/13 BY [name]                Due to the release kinetics of cTnI,     a negative result within the first hours     of the onset of symptoms does not rule out     myocardial infarction with certainty.     If myocardial infarction is still suspected,     repeat the test at appropriate intervals.    ABGS No results found for this basename: PHART, PCO2, PO2ART, TCO2, HCO3,  in the last 72 hours CULTURES Recent Results (from the past 240 hour(s))  MRSA PCR SCREENING     Status: None   Collection Time    11/07/13  1:10  AM      Result Value Ref Range Status   MRSA by PCR NEGATIVE  NEGATIVE Final   Comment:            The GeneXpert MRSA Assay (FDA     approved for NASAL specimens     only), is one component of a     comprehensive MRSA colonization     surveillance program. It is not     intended to diagnose MRSA     infection nor to guide or     monitor treatment for     MRSA infections.   Studies/Results: Dg Chest Port 1 View  11/06/2013   CLINICAL DATA:  Chest pain and shortness breath, history of CHF.  EXAM: PORTABLE CHEST - 1 VIEW  COMPARISON:  CT of the chest Jul 15, 2012 and chest radiograph September 02, 2012  FINDINGS: The cardiac silhouette appears moderate to severely enlarged, which may be accentuated by AP  technique. Diffuse interstitial prominence, small to moderate bilateral pleural effusions and bibasilar patchy airspace opacities. Patient's known pulmonary nodules difficult to appreciate on radiography. No pneumothorax.  Dual lead left cardiac pacemaker in situ. Patient is osteopenic. Soft tissue planes are nonsuspicious.  IMPRESSION: Cardiomegaly, interstitial and bibasilar airspace opacities suggest pulmonary edema with small to moderate pleural effusions.   Electronically Signed   By: Elon Alas   On: 11/06/2013 23:21    Medications:  Prior to Admission:  Prescriptions prior to admission  Medication Sig Dispense Refill  . metoprolol tartrate (LOPRESSOR) 25 MG tablet Take 25 mg by mouth 2 (two) times daily.      . verapamil (CALAN-SR) 180 MG CR tablet Take 180 mg by mouth at bedtime.      [name] warfarin (COUMADIN) 2.5 MG tablet Take 2.5 mg by mouth as directed.      [name] acetaminophen (TYLENOL) 500 MG tablet Take 1 tablet (500 mg total) by mouth 4 (four) times daily.  30 tablet  1  . losartan (COZAAR) 100 MG tablet 100 mg. Take 100 mg by mouth daily.      . metolazone (ZAROXOLYN) 2.5 MG tablet Take 1 tablet (2.5 mg total) by mouth as needed. Take 1 tablet by mouth as needed for leg swelling/SOB  30 tablet  2  . Multiple Vitamin (MULTI-VITAMINS) TABS 1 tablet. Take 1 tablet by mouth daily.      . nitroGLYCERIN (NITROSTAT) 0.4 MG SL tablet Place 1 tablet (0.4 mg total) under the tongue every 5 (five) minutes as needed for chest pain.  24 tablet  4  . pravastatin (PRAVACHOL) 40 MG tablet 40 mg. Take 40 mg by mouth daily.      [name] torsemide (DEMADEX) 20 MG tablet 20 mg. Take 20 mg by mouth daily.       Scheduled: . antiseptic oral rinse  7 mL Mouth Rinse BID  . aspirin EC  81 mg Oral Daily  . furosemide  40 mg Intravenous Q12H  . losartan  100 mg Oral Daily  . metolazone  2.5 mg Oral Daily  . metoprolol tartrate  25 mg Oral BID  . sodium chloride  3 mL Intravenous Q12H  . verapamil  180 mg  Oral QHS   Continuous: . nitroGLYCERIN 20 mcg/min (11/07/13 0800)   ION:GEXBMW chloride, acetaminophen, albuterol, ondansetron (ZOFRAN) IV, ondansetron (ZOFRAN) IV, sodium chloride  Assesment: She is admitted with acute on chronic diastolic CHF. She has multiple other medical problems including what appears to be cirrhosis now. She had  ascites and had 3 L of fluid removed last week. She has aortic stenosis. Her troponin is positive. She has chronic lymphocytic leukemia. She has a history of paroxysmal atrial fibrillation and has a pacemaker. She has chronic kidney disease which is stable. She is chronically anemic because of her leukemia but she has been having GI bleeding. She also has chronic bleeding from her urethra. She is improved from last night. Principal Problem:   Acute on chronic diastolic congestive heart failure Active Problems:   Arteriosclerotic cardiovascular disease (ASCVD)   Chronic anticoagulation   Aortic stenosis   Chronic kidney disease   Gastroesophageal reflux disease   Chronic lymphocytic leukemia   Paroxysmal atrial fibrillation   Pacemaker   Chest pain   CHF (congestive heart failure)    Plan: I confirmed her CODE STATUS. She says that she would like to be placed on life support for 48 hours. I will discuss that with her further once we have further information. She will have cardiology consultation. She will have GI consultation.    LOS: 1 day   [name] L 11/07/2013, 8:41 AM

## 2013-11-07 NOTE — Plan of Care (Signed)
Problem: Consults Goal: Nutrition Consult-if indicated Outcome: Progressing Low salt diet/ cardiac  Problem: Phase I Progression Outcomes Goal: Dyspnea controlled at rest (HF) Outcome: Not Progressing Patient remains SOB Goal: Pain controlled with appropriate interventions Outcome: Not Progressing Patient on nitroglycerin gtt and pain remains to left mid back, heat applied and metoprolol given po, Goal: EF % per last Echo/documented,Core Reminder form on chart Outcome: Completed/Met Date Met:  11/07/13 10/17/13 EF 60-65% Goal: Up in chair, BRP Outcome: Completed/Met Date Met:  11/07/13 Patient prefers to be up in chair, SOB Goal: Initial discharge plan identified Outcome: Progressing Home, son lives nearby Goal: Hemodynamically stable Outcome: Progressing Patient on nitroglycerin gtt at present, and bp is low 100/58 HR 117     

## 2013-11-07 NOTE — Progress Notes (Signed)
CRITICAL VALUE ALERT  Critical value received:  Troponin 0.80   Date of notification:  11/07/2013   Time of notification:  0815  Critical value read back: yes    Nurse who received alert:  Donneta Romberg   MD notified (1st page): Luan Pulling   Time of first page:  0820  MD notified (2nd page):  Time of second page:  Responding MD: Luan Pulling  Time MD responded:  [phone]

## 2013-11-07 NOTE — Progress Notes (Signed)
UR chart review completed.  

## 2013-11-07 NOTE — Consult Note (Signed)
Primary cardiologist: Dr. Kate Sable and Dr. Cristopher Peru Consulting cardiologist: Dr. Satira Sark  Clinical Summary [name] is a medically complex 80 y.o.female with past medical history outlined below, currently admitted to the hospital with chest pain. She states that she had had a busy day yesterday with a doctor's visit, then went out to eat dinner, subsequently began to experience pressure in her upper chest and back with radiation to the neck. She took 3 sublingual nitroglycerin at home without relief and came in for further assessment. At the present time her chest pain has resolved, she is on IV nitroglycerin. Initial cardiac markers were normal, with last troponin I increasing to 0.8.  She was most recently seen in the office by Dr. Lovena Le in August, at that time with symptoms of possible acute on chronic diastolic heart failure. She was reporting increasing peripheral edema and shortness of breath. Followup echocardiogram as noted below, showing preserved LVEF, although evidence of moderate to severe aortic stenosis and mitral regurgitation. She also had a restrictive diastolic filling abnormality and moderate pulmonary hypertension.  She has been diagnosed with ascites and possibly cardiac cirrhosis based on recent evaluation by Dr. Laural Golden, additional studies pending. She has required paracentesis.  She is reasonably functional, lives at home, her son lives nearby. She has assistance from family members regarding meals, also has house cleaners, and other people to help with maintenance and outdoor work. She still drives occasionally.   Allergies  Allergen Reactions  . Vesicare [Solifenacin] Other (See Comments)    Severe stomach pain  . Detrol [Tolterodine Tartrate] Other (See Comments)    Patient states: "I was told to never take it again, I do not recall the reaction"  . [place] [Trospium Chloride] Nausea Only and Other (See Comments)    Intense lower abd pain    . Sulfa Antibiotics Other (See Comments)    Extreme dizziness  . Sulfonamide Derivatives Other (See Comments)    REACTION: GI distress  . Trimethoprim     Other reaction(s): Dermatitis    Medications Scheduled Medications: . antiseptic oral rinse  7 mL Mouth Rinse BID  . aspirin EC  81 mg Oral Daily  . furosemide  40 mg Intravenous Q12H  . losartan  100 mg Oral Daily  . metolazone  2.5 mg Oral Daily  . metoprolol tartrate  25 mg Oral BID  . sodium chloride  3 mL Intravenous Q12H  . verapamil  180 mg Oral QHS    Infusions: . nitroGLYCERIN 20 mcg/min (11/07/13 0800)    PRN Medications: sodium chloride, acetaminophen, albuterol, ondansetron (ZOFRAN) IV, ondansetron (ZOFRAN) IV, sodium chloride   Past Medical History  Diagnosis Date  . Sick sinus syndrome     Dual-chamber pacemaker in 11/2006  . Aortic stenosis     Moderate to severe  . Left bundle branch block   . Chronic diastolic heart failure   . Coronary atherosclerosis of native coronary artery     Cardiac catheterization 10/09: 50% left anterior descending; 80% PDA;  . Epistaxis     Mild with negative ENT evaluation  . Dizziness     Chronic  . Osteoarthritis   . Irritable bowel syndrome   . Urinary frequency   . Gastroesophageal reflux disease     Esophageal dilatation for stricture in 09/2011  . Rosacea   . Chronic lymphocytic leukemia     Stage I with anemia H./H. of 9/27 in 11/09 with normal MCV; iron deficiency in bone marrow  .  Orthostatic hypotension   . Diverticulosis   . Adenomatous polyp October 2010    (Proximal Small bowel) with high grade dysplasia in polypoid lesion straddling D1/D2  . CKD (chronic kidney disease) stage 3, GFR 30-59 ml/min   . Urethral caruncle   . Iron deficiency anemia secondary to blood loss (chronic) 01/19/2013  . History of rectal bleeding 01/19/2013  . PAF (paroxysmal atrial fibrillation)   . Mitral regurgitation     Moderate to severe    Past Surgical History   Procedure Laterality Date  . Cholecystectomy    . Cervical spine surgery    . Lumbar disc surgery    . Bladder suspension    . Abdominal hysterectomy  1972  . Cataract extraction      Bilateral  . Esophagogastroduodenoscopy  01/09/09    Rourk -noncritical Schatzki's ring, small hiatal hernia, fundal gland polyps, bile-stained gastric mucosa, polypoid lesion straddling D1 and D2 1-2 cm, localized lymphangitic-appearing mucosa at D2 biopsied (adenomatous polyp with high grade glandular dysplasia  . A-v cardiac pacemaker insertion  11/2006    [employer]    Family History  Problem Relation Age of Onset  . Heart failure Father   . Cancer Father   . Diabetes Mother     Social History Ms. Newcombe reports that she has never smoked. She has never used smokeless tobacco. Ms. Barth reports that she does not drink alcohol.  Review of Systems Intermittent abdominal bloating, better after paracentesis. No palpitations. She has not had progressive chest pain leading up to this event. NYHA class 2-3 shortness of breath. No orthopnea. No syncope. Other systems reviewed are negative.  Physical Examination Blood pressure 110/48, pulse 78, temperature 97.7 F (36.5 C), temperature source Oral, resp. rate 25, height 5' (1.524 m), weight 140 lb 3.4 oz (63.6 kg), SpO2 96.00%.  Intake/Output Summary (Last 24 hours) at 11/07/13 1046 Last data filed at 11/07/13 0800  Gross per 24 hour  Intake   42.1 ml  Output    950 ml  Net -907.9 ml   Elderly woman, seated in chair at bedside, appears comfortable. HEENT: Conjunctiva and lids normal, oropharynx clear. Neck: Supple, elevated JVP, no carotid bruits, no thyromegaly. Lungs: Decreased breath sounds at the bases, no wheezes, nonlabored breathing at rest. Cardiac: Irregularly irregular, no S3, 2/6 systolic murmur, no pericardial rub. Abdomen: Soft, nontender, mildly protuberant, bowel sounds present, no guarding or rebound. Extremities: No pitting edema,  distal pulses 1-2+. Skin: Warm and dry. Musculoskeletal: Mild kyphosis. Neuropsychiatric: Alert and oriented x3, affect grossly appropriate.   Lab Results  Basic Metabolic Panel:  Recent Labs Lab 11/06/13 2247  NA 144  K 4.6  CL 106  CO2 24  GLUCOSE 137*  BUN 49*  CREATININE 1.26*  CALCIUM 9.0    Liver Function Tests:  Recent Labs Lab 11/06/13 2247  AST 21  ALT 10  ALKPHOS 159*  BILITOT 0.4  PROT 7.0  ALBUMIN 3.7    CBC:  Recent Labs Lab 11/06/13 2247  WBC 15.1*  HGB 10.3*  HCT 32.1*  MCV 99.7  PLT 205    Cardiac Enzymes:  Recent Labs Lab 11/06/13 2247 11/07/13 0214 11/07/13 0730  TROPONINI <0.30 <0.30 0.80*    Pro-BNP: 4148  ECG Atrial fibrillation with RVR, left bundle branch block.  Echocardiogram (10/17/2013): Study Conclusions  - Left ventricle: The cavity size was normal. There was moderate concentric hypertrophy. Systolic function was normal. The estimated ejection fraction was in the range of 60% to 65%. Doppler  parameters are consistent with a reversible restrictive pattern, indicative of decreased left ventricular diastolic compliance and/or increased left atrial pressure (grade 3 diastolic dysfunction). Doppler parameters are consistent with both elevated ventricular end-diastolic filling pressure and elevated left atrial filling pressure. - Ventricular septum: The contour showed diastolic flattening and systolic flattening. These changes are consistent with RV volume and pressure overload. - Aortic valve: Morphologically, there is moderate to severe aortic stenosis. Moderately calcified annulus. Trileaflet; moderately thickened, severely calcified leaflets. There was mild regurgitation. Peak velocity (S): 339 cm/s. Mean gradient (S): 27 mm Hg. Valve area (VTI): 1.03 cm^2. Valve area (Vmax): 0.98 cm^2. - Mitral valve: Severely calcified annulus. Mildly thickened leaflets . There was moderate to severe regurgitation. Valve  area by continuity equation (using LVOT flow): 1.38 cm^2. - Left atrium: The atrium was severely dilated. - Right ventricle: The cavity size was mildly to moderately dilated. Pacer wire or catheter noted in right ventricle. - Right atrium: The atrium was severely dilated. Pacer wire or catheter noted in right atrium. Central venous pressure (est): 8 mm Hg. - Tricuspid valve: Incomplete valve coaptation due to device wire. There was moderate-severe regurgitation. - Pulmonic valve: There was mild regurgitation. - Pulmonary arteries: PA peak pressure: 53 mm Hg (S). Moderately elevated pulmonary pressures. - Systemic veins: IVC diameter 2.03 cm with blunted respirophasic variation. Estimated CVP 8 mmHg.  Impressions:  - When compared to report dated 12/01/2012, degree of aortic stenosis and mitral regurgitation is unchanged allowing for differences in technique. Pulmonary pressures have decreased.   Imaging EXAM: PORTABLE CHEST - 1 VIEW  COMPARISON: CT of the chest Jul 15, 2012 and chest radiograph September 02, 2012  FINDINGS: The cardiac silhouette appears moderate to severely enlarged, which may be accentuated by AP technique. Diffuse interstitial prominence, small to moderate bilateral pleural effusions and bibasilar patchy airspace opacities. Patient's known pulmonary nodules difficult to appreciate on radiography. No pneumothorax.  Dual lead left cardiac pacemaker in situ. Patient is osteopenic. Soft tissue planes are nonsuspicious.  IMPRESSION: Cardiomegaly, interstitial and bibasilar airspace opacities suggest pulmonary edema with small to moderate pleural effusions.   Impression  1. Presentation with prolonged episode of chest pain, initial cardiac markers negative but most recent troponin I 0.80. Probable NSTEMI, type I versus type II. ECG shows old left bundle branch block. She is currently comfortable on medical therapy.  2. Acute on chronic diastolic heart failure,  LVEF 60-65% with grade 3 diastolic dysfunction by recent assessment.  3. Moderate to severe aortic stenosis and mitral regurgitation.  4. Paroxysmal atrial fibrillation with sick sinus syndrome status post dual-chamber pacemaker, on Coumadin. INR therapeutic at 2.1.  5. Known CAD, heart catheterization results from 2009 noted above, predominantly nonobstructive at that time, although certainly suspect some degree of progression over the years.  6. CKD, stage III, creatinine 1.2 at present.  7. Recently diagnosed ascites with possible cirrhosis, etiology pending further workup.  8. History of GI bleeding as noted in the medical history section.  9. Chronic lymphocytic leukemia.   Recommendations  Discussed with patient, reviewed chart and recent studies. Would continue to cycle cardiac markers to better assess troponin I trend. Current regimen includes aspirin, Cozaar, Zaroxolyn, Lopressor, and verapamil CR. Coumadin held at present in case any invasive studies are required. General preference at this time would be medical management rather than pursuing a heart catheterization in light of her medical complexity. Even if she were to have a coronary lesion that could be treated with stent placement, this  would necessitate broadening her antiplatelet regimen and further complicate bleeding risk in light of ongoing need for anticoagulation for PAF as well. She has other conditions that could certainly precipitate angina as well, valvular heart disease in particular, and is not a candidate for surgery. We will follow with you.  Satira Sark, M.D., F.A.C.C.

## 2013-11-07 NOTE — Progress Notes (Addendum)
Referring Provider: [name], MD Primary Care Physician:  [name], MD Primary Gastroenterologist:  Dr. Laural Golden  Chief Complaint  Patient presents with  . New Evaluation    Rectal Bleeding    HPI:  [name] is a 80 y.o. female is here as a referral from Dr. Luan Pulling for evaluation of rectal bleeding and new onset of ascites. Patient was last seen by me in July 2013 for dysphagia secondary to high-grade distal esophageal ring which was dilated the balloon. Patient is accompanied by her son [name] today.  Patient states that she has not been doing well for several months. She has history of CLL and is closely followed at oncology clinic. She had abdominopelvic CT in September last year and found to have small amount of ascites. She's been developing progressive abdominal distention over a period of several weeks. She also has noted weakness and dyspnea. She was seen by Mr. Sheldon Silvan, [facility] on 10/28/2013 and noted to have significant ascites which was tapped by Dr. Lavonia Dana on 10/30/2013 with removal of 3.3 L of fluid. Patient feels much better since undergoing abdominal tap. Patient states she's been passing blood per 7 off and on for few months. She was seeing blood with her bowel movements almost daily prior to undergoing abdominal tap. She did not pass blood per rectum since undergoing abdominal tap except this morning she noted small amount. She decided to bring tissue covered with blood for me to look at. He also complains of diarrhea. She is having anywhere from one to 6 stools per day. Stool consistency based from loose to hard stool. She also has urgency. She has bowel movements day and night. She says she does not go to bed until after midnight. She denies abdominal pain or melena. Heartburn is well controlled with therapy. She denies nausea vomiting or dysphagia. Appetite is not as good as it used to be. She has lost pounds since July 2013. Patient lives alone but her son  and daughter live few blocks away.       Past Medical History  Diagnosis Date  . Sick sinus syndrome 11/2006    paroxysmal atrial fibrillation; dual-chamber pacemaker in 11/2006  . Aortic stenosis     mild  . Left bundle branch block   . Congestive heart failure     Congestive heart failure with normal ejection fraction  . Arteriosclerotic cardiovascular disease (ASCVD)     coronary angio in 10/09:50% left anterior descending; 80% PDA; medical therapy advisedOrthostatic decrease in blood pressure  . Epistaxis     mild with negative ENT evaluation  . Dizziness     chronic  . Osteoarthritis   . Irritable bowel syndrome   . Urinary frequency     with incontinence  . Gastroesophageal reflux disease     Esophageal dilatation for stricture in 09/2011  . Rosacea   . Chronic lymphocytic leukemia     Stage I with anemia H./H. of 9/27 in 11/09 with normal MCV; iron deficiency in bone marrow  . Orthostatic hypotension   . Chronic anticoagulation   . Diverticulosis   . 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 Chronic kidney disease      Creatinine of 1.7 in 8/08, 1.4 in 10/08 and 1.5 in 11/09  . Urethral caruncle   . Iron deficiency anemia secondary to blood loss (chronic) 01/19/2013  . History of rectal bleeding 01/19/2013  Past Surgical History  Procedure Laterality Date  . Cholecystectomy    . Cervical spine surgery    . Lumbar disc surgery    . Bladder suspension    . Abdominal hysterectomy  1972  . Cataract extraction      Bilateral  . Esophagogastroduodenoscopy  01/09/09    Rourk -noncritical Schatzki's ring, small hiatal hernia, fundal gland polyps, bile-stained gastric mucosa, polypoid lesion straddling D1 and D2 1-2 cm, localized lymphangitic-appearing mucosa at D2 biopsied (adenomatous polyp with high grade glandular dysplasia  . A-v cardiac pacemaker insertion  11/2006    [employer]     Current Outpatient Prescriptions  Medication Sig Dispense Refill  . acetaminophen (TYLENOL) 500 MG tablet Take 1 tablet (500 mg total) by mouth 4 (four) times daily.  30 tablet  1  . losartan (COZAAR) 100 MG tablet 100 mg. Take 100 mg by mouth daily.      . metolazone (ZAROXOLYN) 2.5 MG tablet Take 1 tablet (2.5 mg total) by mouth as needed. Take 1 tablet by mouth as needed for leg swelling/SOB  30 tablet  2  . Multiple Vitamin (MULTI-VITAMINS) TABS 1 tablet. Take 1 tablet by mouth daily.      . nitroGLYCERIN (NITROSTAT) 0.4 MG SL tablet Place 1 tablet (0.4 mg total) under the tongue every 5 (five) minutes as needed for chest pain.  24 tablet  4  . pravastatin (PRAVACHOL) 40 MG tablet 40 mg. Take 40 mg by mouth daily.      [name] torsemide (DEMADEX) 20 MG tablet 20 mg. Take 20 mg by mouth daily.      . metoprolol tartrate (LOPRESSOR) 25 MG tablet Take 25 mg by mouth 2 (two) times daily.      . verapamil (CALAN-SR) 180 MG CR tablet Take 180 mg by mouth at bedtime.      [name] warfarin (COUMADIN) 2.5 MG tablet Take 2.5 mg by mouth as directed.       No current facility-administered medications for this visit.    Allergies as of 11/06/2013 - Review Complete 11/06/2013  Allergen Reaction Noted  . Vesicare [solifenacin] Other (See Comments) 11/11/2012  . Detrol [tolterodine tartrate] Other (See Comments) 11/13/2008  . Cloyd Stagers [trospium chloride] Nausea Only and Other (See Comments) 08/26/2010  . Sulfa antibiotics Other (See Comments) 08/26/2010  . Sulfonamide derivatives Other (See Comments)   . Trimethoprim  11/25/2012    Family History  Problem Relation Age of Onset  . Heart failure Father   . Cancer Father   . Diabetes Mother     History   Social History  . Marital Status: Widowed    Spouse Name: N/A    Number of Children: N/A  . Years of Education: N/A   Occupational History  . retired    Social History Main Topics  . Smoking status: Never Smoker   . Smokeless tobacco: Never  Used  . Alcohol Use: No  . Drug Use: No  . Sexual Activity: No   Other Topics Concern  . Not on file   Social History Narrative  . No narrative on file    Review of Systems: See HPI, otherwise normal ROS  Physical Exam: BP 112/60  Pulse 64  Temp(Src) 97.2 F (36.2 C) (Oral)  Resp 16 Patient is alert and in no acute distress. Conjunctiva is pale. Sclerae nonicteric. None masses or thyromegaly noted. Cardiac exam regular rhythm normal S1 and S2. She has grade 3/6 systolic ejection murmur at aortic area and at LLSB. Auscultation  of lungs revealed few rales at left base. Abdomen is full. There is edema to lower half of the abdominal wall. It is soft with easily palpable liver which is very firm and pulsatile. Spleen is nonpalpable. Shifting dullness is absent. 2+ pitting edema noted to both lower extremities.  Lab data; From 10/30/2013. WBC12.5, H&H 9.7 and 30.3 and platelet count 201K. Bilirubin 0.5, AB 151, AST 14, ALT 16, total protein 6.6 and albumin 3.5 Serum calcium 9.0 BUN 60 and creatinine 1.48 Serum iron 42, TIBC 270 and saturation 16% Serum ferritin 176.  Abdominal pelvic CT from 11/15/2012 reviewed.    Assessment;  Patient has multiple GI issues which can be summarized as follows; #1. New onset of ascites. Suspect her ascites is secondary to cirrhosis and cardiac disease. She has history of aortic stenosis and well-documented right sided volume and pressure overload. Last abdominal tap was 9 days ago and now she has minimal ascites. She will need further diagnostic studies at the time of next abdominal tap. #2. Cirrhosis. I suspect patient has cardiac cirrhosis. On exam she has very firm pulsatile liver. However CT one year ago did not show unequivocal changes of cirrhosis. I suspect fluid overload is primarily secondary to aortic stenosis and right heart failure. Hepatitis B surface antigen was negative one year ago. Will look for other common causes of  cirrhosis. #3. Rectal bleeding. Rectal bleeding is possibly secondary to hemorrhoids in the setting of chronic anticoagulation. Last colonoscopy was in October 2007. She will need colonoscopy given her recurrent rectal bleeding. #4. Iron deficiency anemia. There has not been significant drop in her H&H. She could be losing blood from duodenal adenoma or small bowel AV malformations. #5. Duodenal adenoma. This was not manipulated on EGD of July 2013 as I felt that complete endoscopic excision was not possible and risk of complication was high. This adenoma has been resected in the past but has always recurred. Will consider EGD at the time of colonoscopy. #6. Diarrhea with urgency most likely secondary to IBS but she has history of.  Recommendations;  Patient advised to keep acetaminophen dose to no more than 2 g per day. Patient will go to lab for HCV antibody, ANA, SMA, AMA and AFP. Patient will call if abdomen becomes distended again in which case she will need another abdominal paracentesis. Will wait for results of blood work before endoscopic evaluation considered.

## 2013-11-08 ENCOUNTER — Ambulatory Visit (HOSPITAL_COMMUNITY): Payer: PRIVATE HEALTH INSURANCE | Admitting: Oncology

## 2013-11-08 DIAGNOSIS — Z8719 Personal history of other diseases of the digestive system: Secondary | ICD-10-CM

## 2013-11-08 DIAGNOSIS — Z5181 Encounter for therapeutic drug level monitoring: Secondary | ICD-10-CM

## 2013-11-08 DIAGNOSIS — R079 Chest pain, unspecified: Secondary | ICD-10-CM

## 2013-11-08 DIAGNOSIS — D649 Anemia, unspecified: Secondary | ICD-10-CM

## 2013-11-08 DIAGNOSIS — Z7901 Long term (current) use of anticoagulants: Secondary | ICD-10-CM

## 2013-11-08 DIAGNOSIS — E785 Hyperlipidemia, unspecified: Secondary | ICD-10-CM

## 2013-11-08 DIAGNOSIS — N184 Chronic kidney disease, stage 4 (severe): Secondary | ICD-10-CM

## 2013-11-08 DIAGNOSIS — Z950 Presence of cardiac pacemaker: Secondary | ICD-10-CM

## 2013-11-08 DIAGNOSIS — I9589 Other hypotension: Secondary | ICD-10-CM

## 2013-11-08 DIAGNOSIS — C9110 Chronic lymphocytic leukemia of B-cell type not having achieved remission: Secondary | ICD-10-CM

## 2013-11-08 DIAGNOSIS — K7460 Unspecified cirrhosis of liver: Secondary | ICD-10-CM | POA: Diagnosis present

## 2013-11-08 DIAGNOSIS — I059 Rheumatic mitral valve disease, unspecified: Secondary | ICD-10-CM

## 2013-11-08 LAB — BASIC METABOLIC PANEL
Anion gap: 11 (ref 5–15)
BUN: 53 mg/dL — ABNORMAL HIGH (ref 6–23)
CALCIUM: 8.8 mg/dL (ref 8.4–10.5)
CO2: 27 mEq/L (ref 19–32)
CREATININE: 1.63 mg/dL — AB (ref 0.50–1.10)
Chloride: 105 mEq/L (ref 96–112)
GFR calc Af Amer: 30 mL/min — ABNORMAL LOW (ref >90)
GFR calc non Af Amer: 26 mL/min — ABNORMAL LOW (ref >90)
Glucose, Bld: 131 mg/dL — ABNORMAL HIGH (ref 70–99)
Potassium: 4.8 mEq/L (ref 3.7–5.3)
Sodium: 143 mEq/L (ref 137–147)

## 2013-11-08 LAB — ANTI-SMOOTH MUSCLE ANTIBODY, IGG: Smooth Muscle Ab: 17 U

## 2013-11-08 LAB — AFP TUMOR MARKER: AFP TUMOR MARKER: 1.9 ng/mL (ref <6.1)

## 2013-11-08 LAB — PROTIME-INR
INR: 2.04 — ABNORMAL HIGH (ref 0.00–1.49)
Prothrombin Time: 23 seconds — ABNORMAL HIGH (ref 11.6–15.2)

## 2013-11-08 MED ORDER — HYDROCODONE-ACETAMINOPHEN 5-325 MG PO TABS
1.0000 | ORAL_TABLET | Freq: Four times a day (QID) | ORAL | Status: DC | PRN
  Administered 2013-11-08 – 2013-11-11 (×5): 1 via ORAL
  Filled 2013-11-08 (×5): qty 1

## 2013-11-08 NOTE — Progress Notes (Signed)
Subjective: She says she feels somewhat better. She's not quite as short of breath. She's complaining of back pain and she says her current dose of Tylenol is not controlling. Although she had back and chest discomfort during her acute coronary syndrome the back pain that she's complaining of now is chronic and musculoskeletal I believe. She denies any chest pain. She has not noticed any bleeding.  Objective: Vital signs in last 24 hours: Temp:  [97.4 F (36.3 C)-98.4 F (36.9 C)] 97.5 F (36.4 C) (08/26 0400) Pulse Rate:  [38-102] 62 (08/26 0600) Resp:  [10-29] 21 (08/26 0600) BP: (74-145)/(24-85) 88/44 mmHg (08/26 0600) SpO2:  [91 %-100 %] 96 % (08/26 0600) Weight:  [61.8 kg (136 lb 3.9 oz)] 61.8 kg (136 lb 3.9 oz) (08/26 0500) Weight change: -3.972 kg (-8 lb 12.1 oz) Last BM Date: 11/07/13  Intake/Output from previous day: 08/25 0701 - 08/26 0700 In: 440.4 [P.O.:360; I.V.:80.4] Out: 776 [Urine:775; Stool:1]  PHYSICAL EXAM General appearance: alert, cooperative and mild distress Resp: clear to auscultation bilaterally Cardio: Her heart is generally regular and by monitor looks like it is paced. I don't hear a gallop GI: soft, non-tender; bowel sounds normal; no masses,  no organomegaly Extremities: She has some edema of the lower extremities  Lab Results:  Results for orders placed during the hospital encounter of 11/06/13 (from the past 48 hour(s))  CBC     Status: Abnormal   Collection Time    11/06/13 10:47 PM      Result Value Ref Range   WBC 15.1 (*) 4.0 - 10.5 K/uL   RBC 3.22 (*) 3.87 - 5.11 MIL/uL   Hemoglobin 10.3 (*) 12.0 - 15.0 g/dL   HCT 32.1 (*) 36.0 - 46.0 %   MCV 99.7  78.0 - 100.0 fL   MCH 32.0  26.0 - 34.0 pg   MCHC 32.1  30.0 - 36.0 g/dL   RDW 14.6  11.5 - 15.5 %   Platelets 205  150 - 400 K/uL  TROPONIN I     Status: None   Collection Time    11/06/13 10:47 PM      Result Value Ref Range   Troponin I <0.30  <0.30 ng/mL   Comment:            Due  to the release kinetics of cTnI,     a negative result within the first hours     of the onset of symptoms does not rule out     myocardial infarction with certainty.     If myocardial infarction is still suspected,     repeat the test at appropriate intervals.  COMPREHENSIVE METABOLIC PANEL     Status: Abnormal   Collection Time    11/06/13 10:47 PM      Result Value Ref Range   Sodium 144  137 - 147 mEq/L   Potassium 4.6  3.7 - 5.3 mEq/L   Chloride 106  96 - 112 mEq/L   CO2 24  19 - 32 mEq/L   Glucose, Bld 137 (*) 70 - 99 mg/dL   BUN 49 (*) 6 - 23 mg/dL   Creatinine, Ser 1.26 (*) 0.50 - 1.10 mg/dL   Calcium 9.0  8.4 - 10.5 mg/dL   Total Protein 7.0  6.0 - 8.3 g/dL   Albumin 3.7  3.5 - 5.2 g/dL   AST 21  0 - 37 U/L   ALT 10  0 - 35 U/L   Alkaline Phosphatase 159 (*)  39 - 117 U/L   Total Bilirubin 0.4  0.3 - 1.2 mg/dL   GFR calc non Af Amer 35 (*) >90 mL/min   GFR calc Af Amer 41 (*) >90 mL/min   Comment: (NOTE)     The eGFR has been calculated using the CKD EPI equation.     This calculation has not been validated in all clinical situations.     eGFR's persistently <90 mL/min signify possible Chronic Kidney     Disease.   Anion gap 14  5 - 15  PRO B NATRIURETIC PEPTIDE     Status: Abnormal   Collection Time    11/06/13 10:47 PM      Result Value Ref Range   Pro B Natriuretic peptide (BNP) 4148.0 (*) 0 - 450 pg/mL  PROTIME-INR     Status: Abnormal   Collection Time    11/07/13 12:09 AM      Result Value Ref Range   Prothrombin Time 25.5 (*) 11.6 - 15.2 seconds   INR 2.32 (*) 0.00 - 1.49  MRSA PCR SCREENING     Status: None   Collection Time    11/07/13  1:10 AM      Result Value Ref Range   MRSA by PCR NEGATIVE  NEGATIVE   Comment:            The GeneXpert MRSA Assay (FDA     approved for NASAL specimens     only), is one component of a     comprehensive MRSA colonization     surveillance program. It is not     intended to diagnose MRSA     infection nor to  guide or     monitor treatment for     MRSA infections.  TROPONIN I     Status: None   Collection Time    11/07/13  2:14 AM      Result Value Ref Range   Troponin I <0.30  <0.30 ng/mL   Comment:            Due to the release kinetics of cTnI,     a negative result within the first hours     of the onset of symptoms does not rule out     myocardial infarction with certainty.     If myocardial infarction is still suspected,     repeat the test at appropriate intervals.  PROTIME-INR     Status: Abnormal   Collection Time    11/07/13  7:30 AM      Result Value Ref Range   Prothrombin Time 24.2 (*) 11.6 - 15.2 seconds   INR 2.17 (*) 0.00 - 1.49  TROPONIN I     Status: Abnormal   Collection Time    11/07/13  7:30 AM      Result Value Ref Range   Troponin I 0.80 (*) <0.30 ng/mL   Comment: CRITICAL RESULT CALLED TO, READ BACK BY AND VERIFIED WITH:     GREY,M AT 8:15AM ON 11/07/13 BY FESTERMAN,C                Due to the release kinetics of cTnI,     a negative result within the first hours     of the onset of symptoms does not rule out     myocardial infarction with certainty.     If myocardial infarction is still suspected,     repeat the test at appropriate intervals.  TROPONIN I  Status: Abnormal   Collection Time    11/07/13  1:34 PM      Result Value Ref Range   Troponin I 0.93 (*) <0.30 ng/mL   Comment:            Due to the release kinetics of cTnI,     a negative result within the first hours     of the onset of symptoms does not rule out     myocardial infarction with certainty.     If myocardial infarction is still suspected,     repeat the test at appropriate intervals.     CRITICAL VALUE NOTED.  VALUE IS CONSISTENT WITH PREVIOUSLY REPORTED AND CALLED VALUE.  BASIC METABOLIC PANEL     Status: Abnormal   Collection Time    11/08/13  4:40 AM      Result Value Ref Range   Sodium 143  137 - 147 mEq/L   Potassium 4.8  3.7 - 5.3 mEq/L   Chloride 105  96 - 112  mEq/L   CO2 27  19 - 32 mEq/L   Glucose, Bld 131 (*) 70 - 99 mg/dL   BUN 53 (*) 6 - 23 mg/dL   Creatinine, Ser 1.63 (*) 0.50 - 1.10 mg/dL   Calcium 8.8  8.4 - 10.5 mg/dL   GFR calc non Af Amer 26 (*) >90 mL/min   GFR calc Af Amer 30 (*) >90 mL/min   Comment: (NOTE)     The eGFR has been calculated using the CKD EPI equation.     This calculation has not been validated in all clinical situations.     eGFR's persistently <90 mL/min signify possible Chronic Kidney     Disease.   Anion gap 11  5 - 15  PROTIME-INR     Status: Abnormal   Collection Time    11/08/13  4:40 AM      Result Value Ref Range   Prothrombin Time 23.0 (*) 11.6 - 15.2 seconds   INR 2.04 (*) 0.00 - 1.49    ABGS No results found for this basename: PHART, PCO2, PO2ART, TCO2, HCO3,  in the last 72 hours CULTURES Recent Results (from the past 240 hour(s))  MRSA PCR SCREENING     Status: None   Collection Time    11/07/13  1:10 AM      Result Value Ref Range Status   MRSA by PCR NEGATIVE  NEGATIVE Final   Comment:            The GeneXpert MRSA Assay (FDA     approved for NASAL specimens     only), is one component of a     comprehensive MRSA colonization     surveillance program. It is not     intended to diagnose MRSA     infection nor to guide or     monitor treatment for     MRSA infections.   Studies/Results: US Abdomen Complete  11/07/2013   CLINICAL DATA:  Evaluate for ascites. Short of breath. History of a cholecystectomy.  EXAM: ULTRASOUND ABDOMEN COMPLETE  COMPARISON:  Ultrasound, 10/30/2013.  CT, 11/15/2012  FINDINGS: Gallbladder:  Surgically removed  Common bile duct:  Diameter: 6.9 mm  Liver:  Borderline enlarged. Coarsened echotexture. Subtle surface nodularity. No liver mass or focal lesion. Hepatopetal flow documented in the portal vein.  IVC:  No abnormality visualized.  Pancreas:  Limited evaluation.  Portions seen are unremarkable.  Spleen:  Size and appearance within normal limits.  Right  Kidney:  Length: 9.8 cm. Diffuse renal parenchymal thinning. Mild increased parenchymal echogenicity. No mass, stone or hydronephrosis.  Left Kidney:  Length: 10.3 cm. Diffuse renal parenchymal thinning. Mild increased parenchymal echogenicity. No mass, stone or hydronephrosis.  Abdominal aorta:  No aneurysm visualized.  Other findings:  Small amount of ascites lies over the dome of the liver.  IMPRESSION: 1. No acute findings. 2. Small amount of ascites. 3. Appearance of the liver suggests cirrhosis. 4. Findings consistent with medical renal disease with renal cortical thinning and increased renal parenchymal echogenicity.   Electronically Signed   By: Lajean Manes M.D.   On: 11/07/2013 15:48   Dg Chest Port 1 View  11/06/2013   CLINICAL DATA:  Chest pain and shortness breath, history of CHF.  EXAM: PORTABLE CHEST - 1 VIEW  COMPARISON:  CT of the chest Jul 15, 2012 and chest radiograph September 02, 2012  FINDINGS: The cardiac silhouette appears moderate to severely enlarged, which may be accentuated by AP technique. Diffuse interstitial prominence, small to moderate bilateral pleural effusions and bibasilar patchy airspace opacities. Patient's known pulmonary nodules difficult to appreciate on radiography. No pneumothorax.  Dual lead left cardiac pacemaker in situ. Patient is osteopenic. Soft tissue planes are nonsuspicious.  IMPRESSION: Cardiomegaly, interstitial and bibasilar airspace opacities suggest pulmonary edema with small to moderate pleural effusions.   Electronically Signed   By: Elon Alas   On: 11/06/2013 23:21    Medications:  Prior to Admission:  Prescriptions prior to admission  Medication Sig Dispense Refill  . acetaminophen (TYLENOL) 500 MG tablet Take 1 tablet (500 mg total) by mouth 4 (four) times daily.  30 tablet  1  . losartan (COZAAR) 100 MG tablet Take 100 mg by mouth daily.      . metolazone (ZAROXOLYN) 2.5 MG tablet Take 1 tablet (2.5 mg total) by mouth as needed. Take 1  tablet by mouth as needed for leg swelling/SOB  30 tablet  2  . metoprolol tartrate (LOPRESSOR) 25 MG tablet Take 25 mg by mouth 2 (two) times daily.      . Multiple Vitamins-Minerals (MULTIVITAMIN WITH MINERALS) tablet Take 1 tablet by mouth daily.      . nitroGLYCERIN (NITROSTAT) 0.4 MG SL tablet Place 1 tablet (0.4 mg total) under the tongue every 5 (five) minutes as needed for chest pain.  24 tablet  4  . pravastatin (PRAVACHOL) 40 MG tablet Take 40 mg by mouth at bedtime.      . torsemide (DEMADEX) 20 MG tablet Take 10 mg by mouth daily.      . verapamil (CALAN-SR) 180 MG CR tablet Take 180 mg by mouth at bedtime.      [name] warfarin (COUMADIN) 2.5 MG tablet Take 1.25-2.5 mg by mouth daily. Patient takes 1.60m on Mon.,& Thurs. And 2.57mall other days       Scheduled: . antiseptic oral rinse  7 mL Mouth Rinse BID  . aspirin EC  81 mg Oral Daily  . furosemide  40 mg Intravenous Q12H  . losartan  100 mg Oral Daily  . metolazone  2.5 mg Oral Daily  . metoprolol tartrate  25 mg Oral BID  . sodium chloride  3 mL Intravenous Q12H  . verapamil  180 mg Oral QHS   Continuous: . nitroGLYCERIN Stopped (11/08/13 0100)   PRBSW:HQPRFFhloride, acetaminophen, albuterol, HYDROcodone-acetaminophen, ondansetron (ZOFRAN) IV, sodium chloride  Assesment: She was admitted with acute on chronic diastolic congestive heart failure and that has improved.  She is still somewhat short of breath. She is slightly dyspneic at rest.  She has had a non-STEMI. She denies any further chest pain.  She has atrial fibrillation and sick sinus syndrome and has a pacemaker which appears to be functioning normally  She has cirrhosis of the liver and her ultrasound did not show much a 6 fluid. We are trying to limit her Tylenol because of the cirrhosis but she says the current dose of Tylenol is not controlling her chronic back pain  She has chronic back pain and I'm going to switch her to hydrocodone and see if that will help  without giving her too much Tylenol  She has been having GI bleeding but workup for that has been postponed.  She is chronically anticoagulated and her Coumadin had been discontinued in anticipation that she might need some sort of invasive workup but that  Is not planned from a cardiac point of view and I will discuss with Dr.Rehman and see when he thinks he might do EGD and colonoscopy  She has hypertension which is well controlled  She has a history of bleeding from her urethra from a chronic urethral caruncle. This seems to be doing better  She has chronic lymphocytic leukemia and is chronically anemic Principal Problem:   Acute on chronic diastolic congestive heart failure Active Problems:   HYPERTENSION   Arteriosclerotic cardiovascular disease (ASCVD)   Chronic anticoagulation   Aortic stenosis   Chronic kidney disease   Gastroesophageal reflux disease   Chronic lymphocytic leukemia   Paroxysmal atrial fibrillation   Pacemaker   Chest pain   CHF (congestive heart failure)   NSTEMI (non-ST elevated myocardial infarction)   Cirrhosis of liver without mention of alcohol    Plan: Continue current treatments. She will take hydrocodone for pain and see if she can tolerate this with less Tylenol intake    LOS: 2 days   Malasha Kleppe L 11/08/2013, 7:48 AM

## 2013-11-08 NOTE — Progress Notes (Signed)
Subjective:  Still having pain left lower chest. Easing some after Vicodin.  Objective:  Vital Signs in the last 24 hours: Temp:  [97.3 F (36.3 C)-98.4 F (36.9 C)] 97.3 F (36.3 C) (08/26 0800) Pulse Rate:  [38-102] 69 (08/26 0900) Resp:  [10-29] 15 (08/26 0900) BP: (74-112)/(24-85) 102/44 mmHg (08/26 0900) SpO2:  [91 %-100 %] 98 % (08/26 0900) Weight:  [136 lb 3.9 oz (61.8 kg)] 136 lb 3.9 oz (61.8 kg) (08/26 0500)  Intake/Output from previous day: 08/25 0701 - 08/26 0700 In: 440.4 [P.O.:360; I.V.:80.4] Out: 776 [Urine:775; Stool:1] Intake/Output from this shift:    Physical Exam: NECK: Without JVD, HJR, or bruit LUNGS: Decreased Breath sounds with fine crackles at bases. HEART: Regular rate and rhythm, no murmur, gallop, rub, bruit, thrill, or heave EXTREMITIES: plus 2-3 edema bilaterally.   Lab Results:  Recent Labs  11/06/13 2247  WBC 15.1*  HGB 10.3*  PLT 205    Recent Labs  11/06/13 2247 11/08/13 0440  NA 144 143  K 4.6 4.8  CL 106 105  CO2 24 27  GLUCOSE 137* 131*  BUN 49* 53*  CREATININE 1.26* 1.63*    Recent Labs  11/07/13 0730 11/07/13 1334  TROPONINI 0.80* 0.93*   Hepatic Function Panel  Recent Labs  11/06/13 2247  PROT 7.0  ALBUMIN 3.7  AST 21  ALT 10  ALKPHOS 159*  BILITOT 0.4   No results found for this basename: CHOL,  in the last 72 hours No results found for this basename: PROTIME,  in the last 72 hours  Imaging: Abdominal US: IMPRESSION: 1. No acute findings. 2. Small amount of ascites. 3. Appearance of the liver suggests cirrhosis. 4. Findings consistent with medical renal disease with renal cortical thinning and increased renal parenchymal echogenicity.     Electronically Signed   By: Lajean Manes M.D.   On: 11/07/2013 15:48   Cardiac Studies: 2Deho 10/17/13: Study Conclusions  - Left ventricle: The cavity size was normal. There was moderate   concentric hypertrophy. Systolic function was normal. The  estimated ejection fraction was in the range of 60% to 65%.   Doppler parameters are consistent with a reversible restrictive   pattern, indicative of decreased left ventricular diastolic   compliance and/or increased left atrial pressure (grade 3   diastolic dysfunction). Doppler parameters are consistent with   both elevated ventricular end-diastolic filling pressure and   elevated left atrial filling pressure. - Ventricular septum: The contour showed diastolic flattening and   systolic flattening. These changes are consistent with RV volume   and pressure overload. - Aortic valve: Morphologically, there is moderate to severe aortic   stenosis. Moderately calcified annulus. Trileaflet; moderately   thickened, severely calcified leaflets. There was mild   regurgitation. Peak velocity (S): 339 cm/s. Mean gradient (S): 27   mm Hg. Valve area (VTI): 1.03 cm^2. Valve area (Vmax): 0.98 cm^2. - Mitral valve: Severely calcified annulus. Mildly thickened   leaflets . There was moderate to severe regurgitation. Valve area   by continuity equation (using LVOT flow): 1.38 cm^2. - Left atrium: The atrium was severely dilated. - Right ventricle: The cavity size was mildly to moderately   dilated. Pacer wire or catheter noted in right ventricle. - Right atrium: The atrium was severely dilated. Pacer wire or   catheter noted in right atrium. Central venous pressure (est): 8   mm Hg. - Tricuspid valve: Incomplete valve coaptation due to device wire.   There was moderate-severe regurgitation. -  Pulmonic valve: There was mild regurgitation. - Pulmonary arteries: PA peak pressure: 53 mm Hg (S). Moderately   elevated pulmonary pressures. - Systemic veins: IVC diameter 2.03 cm with blunted respirophasic   variation. Estimated CVP 8 mmHg.  Impressions:  - When compared to report dated 12/01/2012, degree of aortic   stenosis and mitral regurgitation is unchanged allowing for   differences in technique.  Pulmonary pressures have decreased.   Assessment/Plan:  1. Chest pain troponin I 0.30, 0.80, 0.93. Probable NSTEMI. ECG shows old left bundle branch block. Medical therapy recommended with multiple comorbities. Will discuss with Dr. Bronson Ing. Can't add any meds with BP dropping, mod-severe AS.  2. Acute on chronic diastolic heart failure, LVEF 60-65% with grade 3 diastolic dysfunction by recent assessment. Only diuresed 335 overnight. On Lasix 40 mg IV BID Zaroxolyn 2.5 mg daily  3. Moderate to severe aortic stenosis and mitral regurgitation.  4. Paroxysmal atrial fibrillation with sick sinus syndrome status post dual-chamber pacemaker, on Coumadin on hold.  5. Known CAD, heart catheterization results from 2009 noted above, predominantly nonobstructive at that time, although certainly suspect some degree of progression over the years.  6. CKD, stage III, creatinine 1.63  7. Recently diagnosed ascites with possible cirrhosis, etiology pending further workup.  8. History of GI bleeding as noted in the medical history section.  9. Chronic lymphocytic leukemia.       LOS: 2 days    Ermalinda Barrios 11/08/2013, 9:29 AM

## 2013-11-08 NOTE — Care Management Note (Addendum)
Page 1 of 2   11/14/2013     4:10:26 PM CARE MANAGEMENT NOTE 11/14/2013  Patient:  [name],[name]   Account Number:  [id number]  Date Initiated:  11/08/2013  Documentation initiated by:  Theophilus Kinds  Subjective/Objective Assessment:   Pt admitted from home with CHF. Pt lives alone and has children that are very supportive.     Action/Plan:   Will continue to follow for discharge planning needs.   Anticipated DC Date:  11/11/2013   Anticipated DC Plan:  [place]  CM consult      [facility] Choice  HOME HEALTH   Choice offered to / List presented to:  C-1 Patient   DME arranged  Vassie Moselle      DME agency  [place] arranged  HH-1 RN  [place] PT      [place].   Status of service:  Completed, signed off Medicare Important Message given?  YES (If response is "NO", the following Medicare IM given date fields will be blank) Date Medicare IM given:  11/10/2013 Medicare IM given by:  Christinia Gully C Date Additional Medicare IM given:  11/14/2013 Additional Medicare IM given by:  Theophilus Kinds  Discharge Disposition:  [place]  Per UR Regulation:    If discussed at Long Length of Stay Meetings, dates discussed:   11/14/2013    Comments:  11/14/13 1600 Christinia Gully, RN BSN CM Pts son is concerned that pt not ready for discharge and he is has "other things going on at home" and will not be able to help look after his mother at discharge. Explained to son that Dr. Luan Pulling felt pt was stable for discharge and pt and CM discussed options for placement and pt stated that she was unable to pay private for placement. Pts son stated that knows his mother will not "spend that money" for placement. Pts son reassured that home health is inplace and rollator will be delivered to pts home within 2 days.  11/14/13 [place], RN  BSN CM Pt decided she would like a rollator from [facility]. Emma with [facility] is aware and will arrange for rollator to be dropped shipped to pts home. Pt is aware that it will take a couple of days for rollator to be delivered. Pt also discussed if she is unable to manage at home could she go to [facility] or [facility] Explained process to pt and how insurance will cover the cost. Pt stated that she could not afford to pay private at ALF or SNF. Pt is aware that home health will be coming to check on pt as well.  11/14/13 [place], RN BSN CM Pt discharged home today with [facility] Rn and Pt (per pts choice). Romualdo Bolk of [facility] is aware and will collect the pts information from the chart. [place] services to start within 48 hours of discharge. Pt does not qualify for home O2 per Medicare guidelines. Pt is aware. Offered to arrange home O2 private pay, but pt refused at this time. No other CM needs noted. Pt and pts nurse aware of discharge arrangements.  11/10/13 [place], RN BSN CM Pt potential discharge over the weekend. If so home health arranged with [facility]. Weekend staff to call and fax orders when written. Will also  be assessed for need for home O2 and neb machine.  11/09/13 [place], RN BSN CM PT recommends HH PT. Pt has used [facility] in the past and wants to use them again. Will arrange [facility] RN and PT at discharge. No DME needs noted.  11/08/13 [place], RN BSN CM

## 2013-11-08 NOTE — Progress Notes (Signed)
The patient was seen and examined, and I agree with the assessment and plan as documented above, with modifications as noted below. Pt admitted with NSTEMI and acute on chronic diastolic heart failure, in the context of complex valvular disease and GI bleeding. I had a long discussion with the patient and given her multiple comorbidities, medical therapy is preferred.  Her BP is low normal and soft at times, precluding the use of nitrates. She is currently free of chest pain but has had some sharp left-sided back pain earlier, likely due to MI, and relieved with Vicodin. If she were to continue to experience chest pain, Ranexa can be considered. Ultimately, her valvular disease is inoperable and she would be a poor candidate for TAVR. Thus, palliative care is the most reasonable option with medical therapy for symptom relief. Warfarin can be resumed if deemed necessary. She would not do well with coronary angiography as she would be at high risk for contrast-induced nephropathy given her GFR of 26 ml/min. Otherwise, continue current therapies.

## 2013-11-09 DIAGNOSIS — I5023 Acute on chronic systolic (congestive) heart failure: Secondary | ICD-10-CM

## 2013-11-09 LAB — BASIC METABOLIC PANEL
ANION GAP: 9 (ref 5–15)
BUN: 64 mg/dL — ABNORMAL HIGH (ref 6–23)
CO2: 28 meq/L (ref 19–32)
Calcium: 8.6 mg/dL (ref 8.4–10.5)
Chloride: 103 mEq/L (ref 96–112)
Creatinine, Ser: 2.16 mg/dL — ABNORMAL HIGH (ref 0.50–1.10)
GFR calc non Af Amer: 18 mL/min — ABNORMAL LOW (ref >90)
GFR, EST AFRICAN AMERICAN: 21 mL/min — AB (ref >90)
Glucose, Bld: 122 mg/dL — ABNORMAL HIGH (ref 70–99)
Potassium: 5.5 mEq/L — ABNORMAL HIGH (ref 3.7–5.3)
Sodium: 140 mEq/L (ref 137–147)

## 2013-11-09 LAB — CBC WITH DIFFERENTIAL/PLATELET
BASOS ABS: 0 10*3/uL (ref 0.0–0.1)
Basophils Relative: 1 % (ref 0–1)
EOS PCT: 3 % (ref 0–5)
Eosinophils Absolute: 0.2 10*3/uL (ref 0.0–0.7)
HCT: 31 % — ABNORMAL LOW (ref 36.0–46.0)
Hemoglobin: 9.5 g/dL — ABNORMAL LOW (ref 12.0–15.0)
LYMPHS ABS: 2.7 10*3/uL (ref 0.7–4.0)
Lymphocytes Relative: 31 % (ref 12–46)
MCH: 31.4 pg (ref 26.0–34.0)
MCHC: 30.6 g/dL (ref 30.0–36.0)
MCV: 102.3 fL — ABNORMAL HIGH (ref 78.0–100.0)
Monocytes Absolute: 0.7 10*3/uL (ref 0.1–1.0)
Monocytes Relative: 8 % (ref 3–12)
NEUTROS PCT: 57 % (ref 43–77)
Neutro Abs: 5 10*3/uL (ref 1.7–7.7)
PLATELETS: 173 10*3/uL (ref 150–400)
RBC: 3.03 MIL/uL — AB (ref 3.87–5.11)
RDW: 14.7 % (ref 11.5–15.5)
WBC: 8.7 10*3/uL (ref 4.0–10.5)

## 2013-11-09 LAB — URINALYSIS, ROUTINE W REFLEX MICROSCOPIC
Bilirubin Urine: NEGATIVE
GLUCOSE, UA: NEGATIVE mg/dL
KETONES UR: NEGATIVE mg/dL
NITRITE: NEGATIVE
Protein, ur: NEGATIVE mg/dL
Specific Gravity, Urine: 1.02 (ref 1.005–1.030)
Urobilinogen, UA: 0.2 mg/dL (ref 0.0–1.0)
pH: 5 (ref 5.0–8.0)

## 2013-11-09 LAB — URINE MICROSCOPIC-ADD ON

## 2013-11-09 LAB — PROTIME-INR
INR: 2.05 — ABNORMAL HIGH (ref 0.00–1.49)
PROTHROMBIN TIME: 23.1 s — AB (ref 11.6–15.2)

## 2013-11-09 MED ORDER — SODIUM POLYSTYRENE SULFONATE 15 GM/60ML PO SUSP
30.0000 g | Freq: Once | ORAL | Status: AC
  Administered 2013-11-09: 30 g via ORAL
  Filled 2013-11-09: qty 120

## 2013-11-09 MED ORDER — WARFARIN - PHARMACIST DOSING INPATIENT
Status: DC
  Administered 2013-11-14: 1

## 2013-11-09 MED ORDER — WARFARIN SODIUM 2.5 MG PO TABS
2.5000 mg | ORAL_TABLET | Freq: Once | ORAL | Status: AC
  Administered 2013-11-09: 2.5 mg via ORAL
  Filled 2013-11-09: qty 1

## 2013-11-09 NOTE — Evaluation (Signed)
Physical Therapy Evaluation Patient Details Name: [name] MRN: [id number] DOB: October 31, 1915 Today's Date: 11/09/2013   History of Present Illness  80 yo female h/o diastolic chf, moderate aortic stenosis, cll comes in with several days of worsening sob.  Pt had an outpt paracentesis done last week for ascites and 3 liters was drained off (i dont see note from GI about this) and went to see gi today for f/u on her chronic rectal bleeding.  She went home after that appt, and got very sob and started having some sscp.  No fevers.  No cough.  Pt does not like to take her diuretics as prescribed because they make her urinate a lot.  She has been having worsening swelling in her legs and abdomenal area for weeks.  She has not been able to lie down to sleep.  Has been having pnd and orthopnea for days.  Her son is with her too, and says she really does not watch her salt intake much either.    Clinical Impression  Pt is a 80 year old female who presents to PT with dx of acute on chronic diastolic congestive heart failure.  She reports decreasing activity recently secondary to increasing weakness and fatigue with movement. During evaluation, pt was mod (I) with bed mobility skills, transfers with use of RW and min guard and use of RW with gait.  Noted decreased step length/clearance and cadence due to generalized fatigue with activity.  Pt reports some stiffness in LE (tightness in calves) when initiating gait, though this decreased as gait continued.  Gait distance limited by fatigue with activity.  Recommend continued PT while in the hospital to address strengthening, activity tolerance for improved functional mobility skills with transition to HHPT at discharge.  No DME recommendations at this time.      Follow Up Recommendations Home health PT    Equipment Recommendations  None recommended by PT       Precautions / Restrictions Precautions Precautions: Fall Restrictions Weight Bearing  Restrictions: No      Mobility  Bed Mobility Overal bed mobility: Modified Independent                Transfers Overall transfer level: Modified independent Equipment used: Rolling walker (2 wheeled)             General transfer comment: VC for handplacement to complete transfer without assist from PT  Ambulation/Gait Ambulation/Gait assistance: Min guard Ambulation Distance (Feet): 20 Feet Assistive device: Rolling walker (2 wheeled) Gait Pattern/deviations: Step-through pattern;Decreased step length - right;Decreased step length - left;Decreased dorsiflexion - left;Decreased dorsiflexion - right   Gait velocity interpretation: Below normal speed for age/gender       Balance Overall balance assessment: Needs assistance Sitting-balance support: Feet supported;No upper extremity supported Sitting balance-Leahy Scale: Good     Standing balance support: Bilateral upper extremity supported;During functional activity Standing balance-Leahy Scale: Fair                               Pertinent Vitals/Pain Pain Assessment: 0-10 Pain Score: 4  Pain Location: Lower Abdomen Pain Intervention(s): RN gave pain meds during session;Limited activity within patient's tolerance;Repositioned    Home Living Family/patient expects to be discharged to:: Private residence Living Arrangements: Alone Available Help at Discharge: Family;Friend(s);Available PRN/intermittently Type of Home: House Home Access: Stairs to enter Entrance Stairs-Rails: None Entrance Stairs-Number of Steps: 1 small step through car port Home Layout:  One level Home Equipment: [place] - 2 wheels;Cane - single point;Grab bars - toilet;Grab bars - tub/shower;Shower seat Additional Comments: Walk In Shower    Prior Function Level of Independence: Needs assistance   Gait / Transfers Assistance Needed: Pt reports she is mod (I) with bed mobility skills, transfers, and amb.  Pt uses a RW for  household amb and a std cane in the community.  For longer distances, pt reports use of W/C (to get to [facility] center in hospital on 4th floor)  ADL's / Homemaking Assistance Needed: Pt reports she sponge baths           Extremity/Trunk Assessment               Lower Extremity Assessment: Generalized weakness         Communication   Communication: HOH  Cognition Arousal/Alertness: Awake/alert Behavior During Therapy: WFL for tasks assessed/performed Overall Cognitive Status: Within Functional Limits for tasks assessed                        Assessment/Plan    PT Assessment Patient needs continued PT services  PT Diagnosis Difficulty walking;Generalized weakness   PT Problem List Decreased strength;Decreased activity tolerance;Decreased mobility  PT Treatment Interventions Gait training;Balance training;Functional mobility training;Therapeutic activities;Therapeutic exercise;Patient/family education   PT Goals (Current goals can be found in the Care Plan section) Acute Rehab PT Goals Patient Stated Goal: get stronger to move around more PT Goal Formulation: With patient Time For Goal Achievement: 11/23/13 Potential to Achieve Goals: Good    Frequency Min 3X/week    End of Session Equipment Utilized During Treatment: Gait belt;Oxygen Activity Tolerance: Patient limited by fatigue Patient left: in chair;with call bell/phone within reach;with chair alarm set Nurse Communication: Mobility status         Time: 3614-4315 PT Time Calculation (min): 22 min   Charges:   PT Evaluation $Initial PT Evaluation Tier I: 1 Procedure     [name] 11/09/2013, 9:16 AM

## 2013-11-09 NOTE — Progress Notes (Addendum)
Patient ID: [name], female   DOB: 11-24-1915, 80 y.o.   MRN: [id number] Alert and oriented. Very pleasant. Says she does not feel good, but has felt worse. O2 is going.  She did have a positive troponin x 2 8/215/2015. Says her breathing is better. Sh did have several loose stools yesterday. She could not tell me if she had any blood. Abdomen is soft. BS+. Her liver is easily palpable. BUN and creatinine are coming down Presently receiving Kayexalate for K of 5.5 BMET    Component Value Date/Time   NA 140 11/09/2013 0509   K 5.5* 11/09/2013 0509   CL 103 11/09/2013 0509   CO2 28 11/09/2013 0509   GLUCOSE 122* 11/09/2013 0509   BUN 64* 11/09/2013 0509   CREATININE 2.16* 11/09/2013 0509   CREATININE 1.72* 12/12/2012 1654   CALCIUM 8.6 11/09/2013 0509   GFRNONAA 18* 11/09/2013 0509   GFRAA 21* 11/09/2013 0509     CBC    Component Value Date/Time   WBC 8.7 11/09/2013 0509   RBC 3.03* 11/09/2013 0509   RBC 3.09* 07/27/2013 1035   HGB 9.5* 11/09/2013 0509   HCT 31.0* 11/09/2013 0509   PLT 173 11/09/2013 0509   MCV 102.3* 11/09/2013 0509   MCH 31.4 11/09/2013 0509   MCHC 30.6 11/09/2013 0509   RDW 14.7 11/09/2013 0509   LYMPHSABS 2.7 11/09/2013 0509   MONOABS 0.7 11/09/2013 0509   EOSABS 0.2 11/09/2013 0509   BASOSABS 0.0 11/09/2013 0509   Filed Vitals:   11/09/13 0700 11/09/13 0730 11/09/13 0800 11/09/13 0830  BP: 81/41 85/38 86/42  99/40  Pulse: 60 59 61 68  Temp:   97.6 F (36.4 C)   TempSrc:   Oral   Resp: 15 15 17 30   Height:      Weight:      SpO2: 96% 95% 97% 93%    Assessment/Plan: Rectal bleeding. She will need a colonoscopy on an outpt basis once she is stable.   New onset ascites: Hep C negative. ANA negative, SMA negative.Mitochondrial antibody negative. Her abdomen is soft today.

## 2013-11-09 NOTE — Progress Notes (Signed)
Consulting cardiologist: Kate Sable MD Primary Cardiologist: Kathleen Argue MD  Subjective:   Feeling some better concerning breathing status. States that she has some LLQ pain since having the foley catheter removed. Some ongoing chronic back pain.    Objective:   Temp:  [97.5 F (36.4 C)-98 F (36.7 C)] 98 F (36.7 C) (08/27 1100) Pulse Rate:  [59-91] 73 (08/27 1100) Resp:  [13-35] 19 (08/27 1100) BP: (72-108)/(35-88) 86/67 mmHg (08/27 1100) SpO2:  [91 %-100 %] 93 % (08/27 1100) Weight:  [140 lb 6.9 oz (63.7 kg)] 140 lb 6.9 oz (63.7 kg) (08/27 0600) Last BM Date: 11/09/13  Filed Weights   11/07/13 0207 11/08/13 0500 11/09/13 0600  Weight: 140 lb 3.4 oz (63.6 kg) 136 lb 3.9 oz (61.8 kg) 140 lb 6.9 oz (63.7 kg)    Intake/Output Summary (Last 24 hours) at 11/09/13 1217 Last data filed at 11/09/13 0600  Gross per 24 hour  Intake    243 ml  Output    500 ml  Net   -257 ml    Telemetry: Atrial fibrillation, there is a lot of artifact on telemetry.  Exam:  General: No acute distress.  HEENT: Conjunctiva and lids normal, oropharynx clear.  Lungs: Clear to auscultation, nonlabored.  Cardiac: No elevated JVP or bruits. IRRR, 2/6 holosystolic murmur heard at the apenx and RSB  with some radiation to the carotids no gallop or rub.   Abdomen: Normoactive bowel sounds, tenderness in the left lower quadrant, nondistended.  Extremities: 2+ pitting edema on the left, 1+ on the right, there is some skin wrinkling noted, distal pulses full.  Neuropsychiatric: Alert and oriented x3, affect appropriate.   Lab Results:  Basic Metabolic Panel:  Recent Labs Lab 11/06/13 2247 11/08/13 0440 11/09/13 0509  NA 144 143 140  K 4.6 4.8 5.5*  CL 106 105 103  CO2 24 27 28   GLUCOSE 137* 131* 122*  BUN 49* 53* 64*  CREATININE 1.26* 1.63* 2.16*  CALCIUM 9.0 8.8 8.6    Liver Function Tests:  Recent Labs Lab 11/06/13 2247  AST 21  ALT 10  ALKPHOS 159*    BILITOT 0.4  PROT 7.0  ALBUMIN 3.7    CBC:  Recent Labs Lab 11/06/13 2247 11/09/13 0509  WBC 15.1* 8.7  HGB 10.3* 9.5*  HCT 32.1* 31.0*  MCV 99.7 102.3*  PLT 205 173    Cardiac Enzymes:  Recent Labs Lab 11/07/13 0214 11/07/13 0730 11/07/13 1334  TROPONINI <0.30 0.80* 0.93*    BNP:  Recent Labs  11/25/12 1240 11/06/13 2247  PROBNP 2980.0* 4148.0*    Coagulation:  Recent Labs Lab 11/07/13 0730 11/08/13 0440 11/09/13 0509  INR 2.17* 2.04* 2.05*    Radiology: US Abdomen Complete  11/07/2013   CLINICAL DATA:  Evaluate for ascites. Short of breath. History of a cholecystectomy.  EXAM: ULTRASOUND ABDOMEN COMPLETE  COMPARISON:  Ultrasound, 10/30/2013.  CT, 11/15/2012  FINDINGS: Gallbladder:  Surgically removed  Common bile duct:  Diameter: 6.9 mm  Liver:  Borderline enlarged. Coarsened echotexture. Subtle surface nodularity. No liver mass or focal lesion. Hepatopetal flow documented in the portal vein.  IVC:  No abnormality visualized.  Pancreas:  Limited evaluation.  Portions seen are unremarkable.  Spleen:  Size and appearance within normal limits.  Right Kidney:  Length: 9.8 cm. Diffuse renal parenchymal thinning. Mild increased parenchymal echogenicity. No mass, stone or hydronephrosis.  Left Kidney:  Length: 10.3 cm. Diffuse renal parenchymal thinning. Mild increased parenchymal echogenicity. No mass,  stone or hydronephrosis.  Abdominal aorta:  No aneurysm visualized.  Other findings:  Small amount of ascites lies over the dome of the liver.  IMPRESSION: 1. No acute findings. 2. Small amount of ascites. 3. Appearance of the liver suggests cirrhosis. 4. Findings consistent with medical renal disease with renal cortical thinning and increased renal parenchymal echogenicity.   Electronically Signed   By: Lajean Manes M.D.   On: 11/07/2013 15:48      Medications:   Scheduled Medications: . antiseptic oral rinse  7 mL Mouth Rinse BID  . aspirin EC  81 mg Oral Daily   . metoprolol tartrate  25 mg Oral BID  . sodium chloride  3 mL Intravenous Q12H  . verapamil  180 mg Oral QHS  . warfarin  2.5 mg Oral Once  . [START ON 11/10/2013] Warfarin - Pharmacist Dosing Inpatient   Does not apply Q24H    Infusions: . nitroGLYCERIN Stopped (11/08/13 0100)    PRN Medications: sodium chloride, acetaminophen, albuterol, HYDROcodone-acetaminophen, ondansetron (ZOFRAN) IV, sodium chloride   Assessment and Plan:   1. Acute on Chronic Diastolic CHF: Continues to have some mild lower extremity edema, more on the left than on the right. Minimal diuresis. Hypotensive today. Creatinine has become more elevated at 2.16. Lasix has been discontinued. Weight is labile. Did decrease to 136 after paracentesis.   2. Moderate to Severe AoV stenosis: With multiple comorbidities, she is not a surgical candidate.  3. CAD: Medical management only.  4. Cirrhosis of the Liver: Paracentesis completed removed 3 L, feeling much better.  5. Complaints of LQ pain:  Will do urinalysis checked for UTI. This occurred after moving catheter:     Phill Myron. Carrol Hougland NP  11/09/2013, 12:17 PM and 3 L feeling much better.

## 2013-11-09 NOTE — Progress Notes (Addendum)
The patient was seen and examined, and I agree with the assessment and plan as documented above, with modifications as noted below.  Pt admitted with NSTEMI and acute on chronic diastolic heart failure, in the context of complex valvular disease and GI bleeding.  I had a long discussion on 8/26 with the patient and given her multiple comorbidities, medical therapy is preferred.  Her BP remains low, precluding the use of nitrates. She remains free of chest pain. Lasix has been stopped due to rising creatinine. If she were to develop a recurrence of chest pain, Ranexa can be considered.  Ultimately, her valvular disease is inoperable and she would be a poor candidate for TAVR. Thus, palliative care is the most reasonable option with medical therapy for symptom relief. Dr. Luan Pulling plans to address this with her. Warfarin has been resumed. She would not do well with coronary angiography as she would be at high risk for contrast-induced nephropathy given her GFR of 26 ml/min.  Continue current therapies. No additional recommendations. Will sign off. Please call with questions.

## 2013-11-09 NOTE — Progress Notes (Signed)
ANTICOAGULATION CONSULT NOTE - Initial Consult  Pharmacy Consult for Coumadin Indication: atrial fibrillation  Allergies  Allergen Reactions  . Vesicare [Solifenacin] Other (See Comments)    Severe stomach pain  . Detrol [Tolterodine Tartrate] Other (See Comments)    Patient states: "I was told to never take it again, I do not recall the reaction"  . [place] [Trospium Chloride] Nausea Only and Other (See Comments)    Intense lower abd pain  . Sulfa Antibiotics Other (See Comments)    Extreme dizziness  . Sulfonamide Derivatives Other (See Comments)    REACTION: GI distress  . Trimethoprim     Other reaction(s): Dermatitis    Patient Measurements: Height: 5' (152.4 cm) Weight: 140 lb 6.9 oz (63.7 kg) IBW/kg (Calculated) : 45.5  Vital Signs: Temp: 97.6 F (36.4 C) (08/27 0000) Temp src: Axillary (08/27 0000) BP: 91/45 mmHg (08/27 0630) Pulse Rate: 65 (08/27 0630)  Labs:  Recent Labs  11/06/13 2247  11/07/13 0214 11/07/13 0730 11/07/13 1334 11/08/13 0440 11/09/13 0509  HGB 10.3*  --   --   --   --   --  9.5*  HCT 32.1*  --   --   --   --   --  31.0*  PLT 205  --   --   --   --   --  173  LABPROT  --   < >  --  24.2*  --  23.0* 23.1*  INR  --   < >  --  2.17*  --  2.04* 2.05*  CREATININE 1.26*  --   --   --   --  1.63* 2.16*  TROPONINI <0.30  --  <0.30 0.80* 0.93*  --   --   < > = values in this interval not displayed.  Estimated Creatinine Clearance: 13 ml/min (by C-G formula based on Cr of 2.16).   Medical History: Past Medical History  Diagnosis Date  . Sick sinus syndrome     Dual-chamber pacemaker in 11/2006  . Aortic stenosis     Moderate to severe  . Left bundle branch block   . Chronic diastolic heart failure   . Coronary atherosclerosis of native coronary artery     Cardiac catheterization 10/09: 50% left anterior descending; 80% PDA;  . Epistaxis     Mild with negative ENT evaluation  . Dizziness     Chronic  . Osteoarthritis   . Irritable  bowel syndrome   . Urinary frequency   . Gastroesophageal reflux disease     Esophageal dilatation for stricture in 09/2011  . Rosacea   . Chronic lymphocytic leukemia     Stage I with anemia H./H. of 9/27 in 11/09 with normal MCV; iron deficiency in bone marrow  . Orthostatic hypotension   . Diverticulosis   . Adenomatous polyp October 2010    (Proximal Small bowel) with high grade dysplasia in polypoid lesion straddling D1/D2  . CKD (chronic kidney disease) stage 3, GFR 30-59 ml/min   . Urethral caruncle   . Iron deficiency anemia secondary to blood loss (chronic) 01/19/2013  . History of rectal bleeding 01/19/2013  . PAF (paroxysmal atrial fibrillation)   . Mitral regurgitation     Moderate to severe    Medications:  Prescriptions prior to admission  Medication Sig Dispense Refill  . acetaminophen (TYLENOL) 500 MG tablet Take 1 tablet (500 mg total) by mouth 4 (four) times daily.  30 tablet  1  . losartan (COZAAR) 100 MG  tablet Take 100 mg by mouth daily.      . metolazone (ZAROXOLYN) 2.5 MG tablet Take 1 tablet (2.5 mg total) by mouth as needed. Take 1 tablet by mouth as needed for leg swelling/SOB  30 tablet  2  . metoprolol tartrate (LOPRESSOR) 25 MG tablet Take 25 mg by mouth 2 (two) times daily.      . Multiple Vitamins-Minerals (MULTIVITAMIN WITH MINERALS) tablet Take 1 tablet by mouth daily.      . nitroGLYCERIN (NITROSTAT) 0.4 MG SL tablet Place 1 tablet (0.4 mg total) under the tongue every 5 (five) minutes as needed for chest pain.  24 tablet  4  . pravastatin (PRAVACHOL) 40 MG tablet Take 40 mg by mouth at bedtime.      . torsemide (DEMADEX) 20 MG tablet Take 10 mg by mouth daily.      . verapamil (CALAN-SR) 180 MG CR tablet Take 180 mg by mouth at bedtime.      [name] warfarin (COUMADIN) 2.5 MG tablet Take 1.25-2.5 mg by mouth daily. Patient takes 1.25mg  on Mon.,& Thurs. And 2.5mg  all other days        Assessment: 89 yoF on chronic Coumadin for Afib.  Home dose listed  above.  INR therapeutic on admission.   She was admitted with NSTEMI and Coumadin initially held.  No plans for intervention so Coumadin is being resumed.  INR remains therapeutic.  No bleeding noted.   Goal of Therapy:  INR 2-3   Plan:  Coumadin 2.5mg  po x1 today (since missed dose yesterday) Daily INR for now  Biagio Borg 11/09/2013,8:17 AM

## 2013-11-09 NOTE — Progress Notes (Signed)
Subjective: She says she feels better. She has no new complaints. Her breathing is better. They help from cardiology consultants is noted and appreciated. She had previous discussions about palliative care and at that time was not interested. I will discuss this with her again since her status has changed  Objective: Vital signs in last 24 hours: Temp:  [97.3 F (36.3 C)-98 F (36.7 C)] 97.6 F (36.4 C) (08/27 0000) Pulse Rate:  [59-91] 65 (08/27 0630) Resp:  [13-35] 19 (08/27 0630) BP: (72-108)/(34-88) 91/45 mmHg (08/27 0630) SpO2:  [91 %-100 %] 95 % (08/27 0630) Weight:  [63.7 kg (140 lb 6.9 oz)] 63.7 kg (140 lb 6.9 oz) (08/27 0600) Weight change: 1.9 kg (4 lb 3 oz) Last BM Date: 11/08/13  Intake/Output from previous day: 08/26 0701 - 08/27 0700 In: 243 [P.O.:240; I.V.:3] Out: 500 [Urine:500]  PHYSICAL EXAM General appearance: alert, cooperative and no distress Resp: clear to auscultation bilaterally Cardio: Mildly irregular without gallop but with a systolic murmur GI: soft, non-tender; bowel sounds normal; no masses,  no organomegaly Extremities: extremities normal, atraumatic, no cyanosis or edema  Lab Results:  Results for orders placed during the hospital encounter of 11/06/13 (from the past 48 hour(s))  TROPONIN I     Status: Abnormal   Collection Time    11/07/13  1:34 PM      Result Value Ref Range   Troponin I 0.93 (*) <0.30 ng/mL   Comment:            Due to the release kinetics of cTnI,     a negative result within the first hours     of the onset of symptoms does not rule out     myocardial infarction with certainty.     If myocardial infarction is still suspected,     repeat the test at appropriate intervals.     CRITICAL VALUE NOTED.  VALUE IS CONSISTENT WITH PREVIOUSLY REPORTED AND CALLED VALUE.  BASIC METABOLIC PANEL     Status: Abnormal   Collection Time    11/08/13  4:40 AM      Result Value Ref Range   Sodium 143  137 - 147 mEq/L   Potassium 4.8   3.7 - 5.3 mEq/L   Chloride 105  96 - 112 mEq/L   CO2 27  19 - 32 mEq/L   Glucose, Bld 131 (*) 70 - 99 mg/dL   BUN 53 (*) 6 - 23 mg/dL   Creatinine, Ser 1.63 (*) 0.50 - 1.10 mg/dL   Calcium 8.8  8.4 - 10.5 mg/dL   GFR calc non Af Amer 26 (*) >90 mL/min   GFR calc Af Amer 30 (*) >90 mL/min   Comment: (NOTE)     The eGFR has been calculated using the CKD EPI equation.     This calculation has not been validated in all clinical situations.     eGFR's persistently <90 mL/min signify possible Chronic Kidney     Disease.   Anion gap 11  5 - 15  PROTIME-INR     Status: Abnormal   Collection Time    11/08/13  4:40 AM      Result Value Ref Range   Prothrombin Time 23.0 (*) 11.6 - 15.2 seconds   INR 2.04 (*) 0.00 - 7.62  BASIC METABOLIC PANEL     Status: Abnormal   Collection Time    11/09/13  5:09 AM      Result Value Ref Range   Sodium 140  137 - 147 mEq/L   Potassium 5.5 (*) 3.7 - 5.3 mEq/L   Chloride 103  96 - 112 mEq/L   CO2 28  19 - 32 mEq/L   Glucose, Bld 122 (*) 70 - 99 mg/dL   BUN 64 (*) 6 - 23 mg/dL   Creatinine, Ser 2.16 (*) 0.50 - 1.10 mg/dL   Calcium 8.6  8.4 - 10.5 mg/dL   GFR calc non Af Amer 18 (*) >90 mL/min   GFR calc Af Amer 21 (*) >90 mL/min   Comment: (NOTE)     The eGFR has been calculated using the CKD EPI equation.     This calculation has not been validated in all clinical situations.     eGFR's persistently <90 mL/min signify possible Chronic Kidney     Disease.   Anion gap 9  5 - 15  PROTIME-INR     Status: Abnormal   Collection Time    11/09/13  5:09 AM      Result Value Ref Range   Prothrombin Time 23.1 (*) 11.6 - 15.2 seconds   INR 2.05 (*) 0.00 - 1.49  CBC WITH DIFFERENTIAL     Status: Abnormal   Collection Time    11/09/13  5:09 AM      Result Value Ref Range   WBC 8.7  4.0 - 10.5 K/uL   RBC 3.03 (*) 3.87 - 5.11 MIL/uL   Hemoglobin 9.5 (*) 12.0 - 15.0 g/dL   HCT 31.0 (*) 36.0 - 46.0 %   MCV 102.3 (*) 78.0 - 100.0 fL   MCH 31.4  26.0 - 34.0  pg   MCHC 30.6  30.0 - 36.0 g/dL   RDW 14.7  11.5 - 15.5 %   Platelets 173  150 - 400 K/uL   Neutrophils Relative % 57  43 - 77 %   Neutro Abs 5.0  1.7 - 7.7 K/uL   Lymphocytes Relative 31  12 - 46 %   Lymphs Abs 2.7  0.7 - 4.0 K/uL   Monocytes Relative 8  3 - 12 %   Monocytes Absolute 0.7  0.1 - 1.0 K/uL   Eosinophils Relative 3  0 - 5 %   Eosinophils Absolute 0.2  0.0 - 0.7 K/uL   Basophils Relative 1  0 - 1 %   Basophils Absolute 0.0  0.0 - 0.1 K/uL    ABGS No results found for this basename: PHART, PCO2, PO2ART, TCO2, HCO3,  in the last 72 hours CULTURES Recent Results (from the past 240 hour(s))  MRSA PCR SCREENING     Status: None   Collection Time    11/07/13  1:10 AM      Result Value Ref Range Status   MRSA by PCR NEGATIVE  NEGATIVE Final   Comment:            The GeneXpert MRSA Assay (FDA     approved for NASAL specimens     only), is one component of a     comprehensive MRSA colonization     surveillance program. It is not     intended to diagnose MRSA     infection nor to guide or     monitor treatment for     MRSA infections.   Studies/Results: US Abdomen Complete  11/07/2013   CLINICAL DATA:  Evaluate for ascites. Short of breath. History of a cholecystectomy.  EXAM: ULTRASOUND ABDOMEN COMPLETE  COMPARISON:  Ultrasound, 10/30/2013.  CT, 11/15/2012  FINDINGS: Gallbladder:  Surgically  removed  Common bile duct:  Diameter: 6.9 mm  Liver:  Borderline enlarged. Coarsened echotexture. Subtle surface nodularity. No liver mass or focal lesion. Hepatopetal flow documented in the portal vein.  IVC:  No abnormality visualized.  Pancreas:  Limited evaluation.  Portions seen are unremarkable.  Spleen:  Size and appearance within normal limits.  Right Kidney:  Length: 9.8 cm. Diffuse renal parenchymal thinning. Mild increased parenchymal echogenicity. No mass, stone or hydronephrosis.  Left Kidney:  Length: 10.3 cm. Diffuse renal parenchymal thinning. Mild increased parenchymal  echogenicity. No mass, stone or hydronephrosis.  Abdominal aorta:  No aneurysm visualized.  Other findings:  Small amount of ascites lies over the dome of the liver.  IMPRESSION: 1. No acute findings. 2. Small amount of ascites. 3. Appearance of the liver suggests cirrhosis. 4. Findings consistent with medical renal disease with renal cortical thinning and increased renal parenchymal echogenicity.   Electronically Signed   By: Lajean Manes M.D.   On: 11/07/2013 15:48    Medications:  Prior to Admission:  Prescriptions prior to admission  Medication Sig Dispense Refill  . acetaminophen (TYLENOL) 500 MG tablet Take 1 tablet (500 mg total) by mouth 4 (four) times daily.  30 tablet  1  . losartan (COZAAR) 100 MG tablet Take 100 mg by mouth daily.      . metolazone (ZAROXOLYN) 2.5 MG tablet Take 1 tablet (2.5 mg total) by mouth as needed. Take 1 tablet by mouth as needed for leg swelling/SOB  30 tablet  2  . metoprolol tartrate (LOPRESSOR) 25 MG tablet Take 25 mg by mouth 2 (two) times daily.      . Multiple Vitamins-Minerals (MULTIVITAMIN WITH MINERALS) tablet Take 1 tablet by mouth daily.      . nitroGLYCERIN (NITROSTAT) 0.4 MG SL tablet Place 1 tablet (0.4 mg total) under the tongue every 5 (five) minutes as needed for chest pain.  24 tablet  4  . pravastatin (PRAVACHOL) 40 MG tablet Take 40 mg by mouth at bedtime.      . torsemide (DEMADEX) 20 MG tablet Take 10 mg by mouth daily.      . verapamil (CALAN-SR) 180 MG CR tablet Take 180 mg by mouth at bedtime.      [name] warfarin (COUMADIN) 2.5 MG tablet Take 1.25-2.5 mg by mouth daily. Patient takes 1.58m on Mon.,& Thurs. And 2.571mall other days       Scheduled: . antiseptic oral rinse  7 mL Mouth Rinse BID  . aspirin EC  81 mg Oral Daily  . metoprolol tartrate  25 mg Oral BID  . sodium chloride  3 mL Intravenous Q12H  . sodium polystyrene  30 g Oral Once  . verapamil  180 mg Oral QHS   Continuous: . nitroGLYCERIN Stopped (11/08/13 0100)    PRSWN:IOEVOJhloride, acetaminophen, albuterol, HYDROcodone-acetaminophen, ondansetron (ZOFRAN) IV, sodium chloride  Assesment: She was admitted with acute on chronic diastolic heart failure and a non-STEMI. She has diuresed well but has developed worsened renal function and her potassium is high today. She does feels better as far as her breathing is concerned. She has not had any further chest pain.  She is known to have coronary artery occlusive disease and as mentioned had a non-STEMI during this admission and during her acute illness. Her chest pain is controlled.  She has aortic stenosis and is not felt to be a good candidate for treatment for that.  She has paroxysmal atrial fibrillation and has a pacemaker and currently  her rhythm is mostly paced  She is chronically anticoagulated. This was held but since she's not going to have any procedures this will be reinstituted.  She has a history of hypertension but her blood pressure has been "soft" during this hospitalization  She has chronic lymphocytic leukemia which is stable  She has been having trouble with GI bleeding. Which does not seem to be a current problem. She does need workup of this but that will be at a later time.  She has chronic renal failure which is worse with diuresis and her potassium is elevated. I am going to have her hold diuretics and losartan for now and treat her with Kayexalate Principal Problem:   Acute on chronic diastolic congestive heart failure Active Problems:   HYPERTENSION   Arteriosclerotic cardiovascular disease (ASCVD)   Chronic anticoagulation   Aortic stenosis   Chronic kidney disease   Gastroesophageal reflux disease   Chronic lymphocytic leukemia   Paroxysmal atrial fibrillation   Pacemaker   Chest pain   CHF (congestive heart failure)   NSTEMI (non-ST elevated myocardial infarction)   Cirrhosis of liver without mention of alcohol    Plan: She will have medications held as above.  She will start Kayexalate today. Repeat her labwork tomorrow. Transfer from the ICU.    LOS: 3 days   Jordell Outten L 11/09/2013, 7:46 AM

## 2013-11-10 LAB — BASIC METABOLIC PANEL
ANION GAP: 11 (ref 5–15)
BUN: 73 mg/dL — ABNORMAL HIGH (ref 6–23)
CALCIUM: 8.1 mg/dL — AB (ref 8.4–10.5)
CO2: 27 mEq/L (ref 19–32)
Chloride: 103 mEq/L (ref 96–112)
Creatinine, Ser: 2.33 mg/dL — ABNORMAL HIGH (ref 0.50–1.10)
GFR, EST AFRICAN AMERICAN: 19 mL/min — AB (ref >90)
GFR, EST NON AFRICAN AMERICAN: 17 mL/min — AB (ref >90)
GLUCOSE: 122 mg/dL — AB (ref 70–99)
POTASSIUM: 4.7 meq/L (ref 3.7–5.3)
SODIUM: 141 meq/L (ref 137–147)

## 2013-11-10 LAB — PROTIME-INR
INR: 1.76 — AB (ref 0.00–1.49)
PROTHROMBIN TIME: 20.5 s — AB (ref 11.6–15.2)

## 2013-11-10 MED ORDER — WARFARIN SODIUM 2 MG PO TABS
3.0000 mg | ORAL_TABLET | Freq: Once | ORAL | Status: AC
  Administered 2013-11-10: 3 mg via ORAL
  Filled 2013-11-10 (×2): qty 1

## 2013-11-10 NOTE — Progress Notes (Signed)
Subjective: She says she had an episode of pretty severe shortness of breath last night. She has no other new complaints now. She says she had some shortness of breath this morning when she use the bedside commode. She denies any chest pain. She has no other complaints.  Objective: Vital signs in last 24 hours: Temp:  [97.5 F (36.4 C)-98.3 F (36.8 C)] 98.3 F (36.8 C) (08/28 0400) Pulse Rate:  [61-79] 70 (08/28 0200) Resp:  [14-30] 19 (08/28 0400) BP: (80-117)/(33-76) 96/40 mmHg (08/28 0400) SpO2:  [89 %-100 %] 99 % (08/28 0200) Weight:  [64 kg (141 lb 1.5 oz)] 64 kg (141 lb 1.5 oz) (08/28 0500) Weight change: 0.3 kg (10.6 oz) Last BM Date: 11/09/13  Intake/Output from previous day: 08/27 0701 - 08/28 0700 In: -  Out: 725 [Urine:725]  PHYSICAL EXAM General appearance: alert, cooperative and mild distress Resp: rhonchi bilaterally Cardio: Regular with a systolic murmur. By monitor she appears to be paced GI: soft, non-tender; bowel sounds normal; no masses,  no organomegaly Extremities: Trace to 1+ edema  Lab Results:  Results for orders placed during the hospital encounter of 11/06/13 (from the past 48 hour(s))  BASIC METABOLIC PANEL     Status: Abnormal   Collection Time    11/09/13  5:09 AM      Result Value Ref Range   Sodium 140  137 - 147 mEq/L   Potassium 5.5 (*) 3.7 - 5.3 mEq/L   Chloride 103  96 - 112 mEq/L   CO2 28  19 - 32 mEq/L   Glucose, Bld 122 (*) 70 - 99 mg/dL   BUN 64 (*) 6 - 23 mg/dL   Creatinine, Ser 2.16 (*) 0.50 - 1.10 mg/dL   Calcium 8.6  8.4 - 10.5 mg/dL   GFR calc non Af Amer 18 (*) >90 mL/min   GFR calc Af Amer 21 (*) >90 mL/min   Comment: (NOTE)     The eGFR has been calculated using the CKD EPI equation.     This calculation has not been validated in all clinical situations.     eGFR's persistently <90 mL/min signify possible Chronic Kidney     Disease.   Anion gap 9  5 - 15  PROTIME-INR     Status: Abnormal   Collection Time   11/09/13  5:09 AM      Result Value Ref Range   Prothrombin Time 23.1 (*) 11.6 - 15.2 seconds   INR 2.05 (*) 0.00 - 1.49  CBC WITH DIFFERENTIAL     Status: Abnormal   Collection Time    11/09/13  5:09 AM      Result Value Ref Range   WBC 8.7  4.0 - 10.5 K/uL   RBC 3.03 (*) 3.87 - 5.11 MIL/uL   Hemoglobin 9.5 (*) 12.0 - 15.0 g/dL   HCT 31.0 (*) 36.0 - 46.0 %   MCV 102.3 (*) 78.0 - 100.0 fL   MCH 31.4  26.0 - 34.0 pg   MCHC 30.6  30.0 - 36.0 g/dL   RDW 14.7  11.5 - 15.5 %   Platelets 173  150 - 400 K/uL   Neutrophils Relative % 57  43 - 77 %   Neutro Abs 5.0  1.7 - 7.7 K/uL   Lymphocytes Relative 31  12 - 46 %   Lymphs Abs 2.7  0.7 - 4.0 K/uL   Monocytes Relative 8  3 - 12 %   Monocytes Absolute 0.7  0.1 -  1.0 K/uL   Eosinophils Relative 3  0 - 5 %   Eosinophils Absolute 0.2  0.0 - 0.7 K/uL   Basophils Relative 1  0 - 1 %   Basophils Absolute 0.0  0.0 - 0.1 K/uL  URINALYSIS, ROUTINE W REFLEX MICROSCOPIC     Status: Abnormal   Collection Time    11/09/13  2:00 PM      Result Value Ref Range   Color, Urine YELLOW  YELLOW   APPearance CLEAR  CLEAR   Specific Gravity, Urine 1.020  1.005 - 1.030   pH 5.0  5.0 - 8.0   Glucose, UA NEGATIVE  NEGATIVE mg/dL   Hgb urine dipstick SMALL (*) NEGATIVE   Bilirubin Urine NEGATIVE  NEGATIVE   Ketones, ur NEGATIVE  NEGATIVE mg/dL   Protein, ur NEGATIVE  NEGATIVE mg/dL   Urobilinogen, UA 0.2  0.0 - 1.0 mg/dL   Nitrite NEGATIVE  NEGATIVE   Leukocytes, UA MODERATE (*) NEGATIVE  URINE MICROSCOPIC-ADD ON     Status: Abnormal   Collection Time    11/09/13  2:00 PM      Result Value Ref Range   Squamous Epithelial / LPF FEW (*) RARE   WBC, UA TOO NUMEROUS TO COUNT  <3 WBC/hpf   RBC / HPF 0-2  <3 RBC/hpf   Bacteria, UA MANY (*) RARE  BASIC METABOLIC PANEL     Status: Abnormal   Collection Time    11/10/13  5:20 AM      Result Value Ref Range   Sodium 141  137 - 147 mEq/L   Potassium 4.7  3.7 - 5.3 mEq/L   Chloride 103  96 - 112 mEq/L    CO2 27  19 - 32 mEq/L   Glucose, Bld 122 (*) 70 - 99 mg/dL   BUN 73 (*) 6 - 23 mg/dL   Creatinine, Ser 2.33 (*) 0.50 - 1.10 mg/dL   Calcium 8.1 (*) 8.4 - 10.5 mg/dL   GFR calc non Af Amer 17 (*) >90 mL/min   GFR calc Af Amer 19 (*) >90 mL/min   Comment: (NOTE)     The eGFR has been calculated using the CKD EPI equation.     This calculation has not been validated in all clinical situations.     eGFR's persistently <90 mL/min signify possible Chronic Kidney     Disease.   Anion gap 11  5 - 15  PROTIME-INR     Status: Abnormal   Collection Time    11/10/13  5:20 AM      Result Value Ref Range   Prothrombin Time 20.5 (*) 11.6 - 15.2 seconds   INR 1.76 (*) 0.00 - 1.49    ABGS No results found for this basename: PHART, PCO2, PO2ART, TCO2, HCO3,  in the last 72 hours CULTURES Recent Results (from the past 240 hour(s))  MRSA PCR SCREENING     Status: None   Collection Time    11/07/13  1:10 AM      Result Value Ref Range Status   MRSA by PCR NEGATIVE  NEGATIVE Final   Comment:            The GeneXpert MRSA Assay (FDA     approved for NASAL specimens     only), is one component of a     comprehensive MRSA colonization     surveillance program. It is not     intended to diagnose MRSA     infection nor to  guide or     monitor treatment for     MRSA infections.   Studies/Results: No results found.  Medications:  Prior to Admission:  Prescriptions prior to admission  Medication Sig Dispense Refill  . acetaminophen (TYLENOL) 500 MG tablet Take 1 tablet (500 mg total) by mouth 4 (four) times daily.  30 tablet  1  . losartan (COZAAR) 100 MG tablet Take 100 mg by mouth daily.      . metolazone (ZAROXOLYN) 2.5 MG tablet Take 1 tablet (2.5 mg total) by mouth as needed. Take 1 tablet by mouth as needed for leg swelling/SOB  30 tablet  2  . metoprolol tartrate (LOPRESSOR) 25 MG tablet Take 25 mg by mouth 2 (two) times daily.      . Multiple Vitamins-Minerals (MULTIVITAMIN WITH  MINERALS) tablet Take 1 tablet by mouth daily.      . nitroGLYCERIN (NITROSTAT) 0.4 MG SL tablet Place 1 tablet (0.4 mg total) under the tongue every 5 (five) minutes as needed for chest pain.  24 tablet  4  . pravastatin (PRAVACHOL) 40 MG tablet Take 40 mg by mouth at bedtime.      . torsemide (DEMADEX) 20 MG tablet Take 10 mg by mouth daily.      . verapamil (CALAN-SR) 180 MG CR tablet Take 180 mg by mouth at bedtime.      [name] warfarin (COUMADIN) 2.5 MG tablet Take 1.25-2.5 mg by mouth daily. Patient takes 1.42m on Mon.,& Thurs. And 2.528mall other days       Scheduled: . antiseptic oral rinse  7 mL Mouth Rinse BID  . aspirin EC  81 mg Oral Daily  . metoprolol tartrate  25 mg Oral BID  . sodium chloride  3 mL Intravenous Q12H  . verapamil  180 mg Oral QHS  . Warfarin - Pharmacist Dosing Inpatient   Does not apply Q24H   Continuous: . nitroGLYCERIN Stopped (11/08/13 0100)   PRBWL:SLHTDShloride, acetaminophen, albuterol, HYDROcodone-acetaminophen, ondansetron (ZOFRAN) IV, sodium chloride  Assesment: She was admitted with acute on chronic diastolic CHF. She had an episode of chest pain and had a non-STEMI. She is still having trouble with shortness of breath. She denies any more chest pain.  She has chronic aortic stenosis which appears to be about the same.  She has a history of paroxysmal atrial fibrillation but now appears to be in a paced rhythm at this time  She has acute on chronic renal failure which is worse. I held her diuretics yesterday but her kidney function has deteriorated even with that.  She has cirrhosis of the liver and had significant trouble with ascites which have not reaccumulated when she was checked earlier in the week  She has background of chronic lymphocytic leukemia. This is stable  She has had some GI bleeding but does not appear to be bleeding at this point. Principal Problem:   Acute on chronic diastolic congestive heart failure Active Problems:    HYPERTENSION   Arteriosclerotic cardiovascular disease (ASCVD)   Chronic anticoagulation   Aortic stenosis   Chronic kidney disease   Gastroesophageal reflux disease   Chronic lymphocytic leukemia   Paroxysmal atrial fibrillation   Pacemaker   Chest pain   CHF (congestive heart failure)   NSTEMI (non-ST elevated myocardial infarction)   Cirrhosis of liver without mention of alcohol    Plan: I told her about all of her problems and explained to her the blood seems to be happening now is that as we  treat one of her medical problems it makes one of the other ones worse. She understands the difficulty of her situation and agrees to DO NOT RESUSCITATE status but does not want palliative care at this time. I encouraged her to take her pain medication more frequently because it may help with her dyspnea as well    LOS: 4 days   Raley Novicki L 11/10/2013, 8:08 AM

## 2013-11-10 NOTE — Clinical Documentation Improvement (Addendum)
  Patient presented with Acute on Chronic Diastolic Heart Failure and ruled in for NSTEMI.   Known history of HTN on Lopressor, Calan, Cozaar, Zaroxolyn, and Demadex prior to admission.  Cardiomegaly noted on CXR.  Echo 10/17/2013 - Moderate Concentric LV hypertrophy;  EF 60-65%; Grade 3 Diastolic Dysfunction;  Moderate to severe AS;  Moderate to Severe MR;  Right Ventricle mildly to moderately dilated;  Moderate to severe TR;  PA peak pressure: 53 mm/Hg, systolic;  Moderately elevated pulmonary pressures.  Please document in the progress notes and discharge summary if a condition below provides greater specificity regarding the patient's Diastolic Heart Failure and Hypertension:   - Hypertensive Heart Disease    - Other Condition   - Unable to Determine  Thank You, Erling Conte ,RN Clinical Documentation Specialist:  [facility]

## 2013-11-10 NOTE — Progress Notes (Signed)
[facility] for Coumadin Indication: atrial fibrillation  Allergies  Allergen Reactions  . Vesicare [Solifenacin] Other (See Comments)    Severe stomach pain  . Detrol [Tolterodine Tartrate] Other (See Comments)    Patient states: "I was told to never take it again, I do not recall the reaction"  . [place] [Trospium Chloride] Nausea Only and Other (See Comments)    Intense lower abd pain  . Sulfa Antibiotics Other (See Comments)    Extreme dizziness  . Sulfonamide Derivatives Other (See Comments)    REACTION: GI distress  . Trimethoprim     Other reaction(s): Dermatitis    Patient Measurements: Height: 5' (152.4 cm) Weight: 141 lb 1.5 oz (64 kg) IBW/kg (Calculated) : 45.5  Vital Signs: Temp: 97.6 F (36.4 C) (08/28 0800) Temp src: Oral (08/28 0800) BP: 95/40 mmHg (08/28 1000) Pulse Rate: 59 (08/28 1000)  Labs:  Recent Labs  11/07/13 1334 11/08/13 0440 11/09/13 0509 11/10/13 0520  HGB  --   --  9.5*  --   HCT  --   --  31.0*  --   PLT  --   --  173  --   LABPROT  --  23.0* 23.1* 20.5*  INR  --  2.04* 2.05* 1.76*  CREATININE  --  1.63* 2.16* 2.33*  TROPONINI 0.93*  --   --   --     Estimated Creatinine Clearance: 12.1 ml/min (by C-G formula based on Cr of 2.33).   Medical History: Past Medical History  Diagnosis Date  . Sick sinus syndrome     Dual-chamber pacemaker in 11/2006  . Aortic stenosis     Moderate to severe  . Left bundle branch block   . Chronic diastolic heart failure   . Coronary atherosclerosis of native coronary artery     Cardiac catheterization 10/09: 50% left anterior descending; 80% PDA;  . Epistaxis     Mild with negative ENT evaluation  . Dizziness     Chronic  . Osteoarthritis   . Irritable bowel syndrome   . Urinary frequency   . Gastroesophageal reflux disease     Esophageal dilatation for stricture in 09/2011  . Rosacea   . Chronic lymphocytic leukemia     Stage I with anemia H./H. of  9/27 in 11/09 with normal MCV; iron deficiency in bone marrow  . Orthostatic hypotension   . Diverticulosis   . Adenomatous polyp October 2010    (Proximal Small bowel) with high grade dysplasia in polypoid lesion straddling D1/D2  . CKD (chronic kidney disease) stage 3, GFR 30-59 ml/min   . Urethral caruncle   . Iron deficiency anemia secondary to blood loss (chronic) 01/19/2013  . History of rectal bleeding 01/19/2013  . PAF (paroxysmal atrial fibrillation)   . Mitral regurgitation     Moderate to severe    Medications:  Prescriptions prior to admission  Medication Sig Dispense Refill  . acetaminophen (TYLENOL) 500 MG tablet Take 1 tablet (500 mg total) by mouth 4 (four) times daily.  30 tablet  1  . losartan (COZAAR) 100 MG tablet Take 100 mg by mouth daily.      . metolazone (ZAROXOLYN) 2.5 MG tablet Take 1 tablet (2.5 mg total) by mouth as needed. Take 1 tablet by mouth as needed for leg swelling/SOB  30 tablet  2  . metoprolol tartrate (LOPRESSOR) 25 MG tablet Take 25 mg by mouth 2 (two) times daily.      . Multiple  Vitamins-Minerals (MULTIVITAMIN WITH MINERALS) tablet Take 1 tablet by mouth daily.      . nitroGLYCERIN (NITROSTAT) 0.4 MG SL tablet Place 1 tablet (0.4 mg total) under the tongue every 5 (five) minutes as needed for chest pain.  24 tablet  4  . pravastatin (PRAVACHOL) 40 MG tablet Take 40 mg by mouth at bedtime.      . torsemide (DEMADEX) 20 MG tablet Take 10 mg by mouth daily.      . verapamil (CALAN-SR) 180 MG CR tablet Take 180 mg by mouth at bedtime.      [name] warfarin (COUMADIN) 2.5 MG tablet Take 1.25-2.5 mg by mouth daily. Patient takes 1.25mg  on Mon.,& Thurs. And 2.5mg  all other days        Assessment: 89 yoF on chronic Coumadin for Afib.  Home dose listed above.  INR therapeutic on admission but now below goal. She was admitted with NSTEMI and Coumadin initially held.  No plans for intervention so Coumadin resumed. No bleeding noted.   Goal of Therapy:  INR  2-3   Plan:  Coumadin 3mg  po x1 today. Daily INR for now  Pricilla Larsson 11/10/2013,11:49 AM

## 2013-11-11 LAB — PROTIME-INR
INR: 1.7 — ABNORMAL HIGH (ref 0.00–1.49)
Prothrombin Time: 20 seconds — ABNORMAL HIGH (ref 11.6–15.2)

## 2013-11-11 LAB — CBC WITH DIFFERENTIAL/PLATELET
Basophils Absolute: 0.1 10*3/uL (ref 0.0–0.1)
Basophils Relative: 1 % (ref 0–1)
Eosinophils Absolute: 0.3 10*3/uL (ref 0.0–0.7)
Eosinophils Relative: 3 % (ref 0–5)
HEMATOCRIT: 29.2 % — AB (ref 36.0–46.0)
Hemoglobin: 9.3 g/dL — ABNORMAL LOW (ref 12.0–15.0)
Lymphocytes Relative: 34 % (ref 12–46)
Lymphs Abs: 3.3 10*3/uL (ref 0.7–4.0)
MCH: 31.8 pg (ref 26.0–34.0)
MCHC: 31.8 g/dL (ref 30.0–36.0)
MCV: 100 fL (ref 78.0–100.0)
MONOS PCT: 7 % (ref 3–12)
Monocytes Absolute: 0.7 10*3/uL (ref 0.1–1.0)
NEUTROS ABS: 5.2 10*3/uL (ref 1.7–7.7)
Neutrophils Relative %: 55 % (ref 43–77)
Platelets: 186 10*3/uL (ref 150–400)
RBC: 2.92 MIL/uL — ABNORMAL LOW (ref 3.87–5.11)
RDW: 14.3 % (ref 11.5–15.5)
WBC: 9.6 10*3/uL (ref 4.0–10.5)

## 2013-11-11 LAB — COMPREHENSIVE METABOLIC PANEL
ALT: 15 U/L (ref 0–35)
ANION GAP: 13 (ref 5–15)
AST: 22 U/L (ref 0–37)
Albumin: 2.9 g/dL — ABNORMAL LOW (ref 3.5–5.2)
Alkaline Phosphatase: 145 U/L — ABNORMAL HIGH (ref 39–117)
BILIRUBIN TOTAL: 0.3 mg/dL (ref 0.3–1.2)
BUN: 80 mg/dL — AB (ref 6–23)
CALCIUM: 8.4 mg/dL (ref 8.4–10.5)
CHLORIDE: 101 meq/L (ref 96–112)
CO2: 26 mEq/L (ref 19–32)
Creatinine, Ser: 2.09 mg/dL — ABNORMAL HIGH (ref 0.50–1.10)
GFR calc Af Amer: 22 mL/min — ABNORMAL LOW (ref >90)
GFR, EST NON AFRICAN AMERICAN: 19 mL/min — AB (ref >90)
Glucose, Bld: 125 mg/dL — ABNORMAL HIGH (ref 70–99)
Potassium: 4.3 mEq/L (ref 3.7–5.3)
Sodium: 140 mEq/L (ref 137–147)
Total Protein: 5.8 g/dL — ABNORMAL LOW (ref 6.0–8.3)

## 2013-11-11 MED ORDER — WARFARIN SODIUM 2 MG PO TABS
3.0000 mg | ORAL_TABLET | Freq: Once | ORAL | Status: AC
  Administered 2013-11-11: 3 mg via ORAL

## 2013-11-11 MED ORDER — WARFARIN SODIUM 1 MG PO TABS
ORAL_TABLET | ORAL | Status: AC
  Filled 2013-11-11: qty 1

## 2013-11-11 MED ORDER — WARFARIN SODIUM 2 MG PO TABS
ORAL_TABLET | ORAL | Status: AC
  Filled 2013-11-11: qty 1

## 2013-11-11 MED ORDER — HYDROCODONE-ACETAMINOPHEN 5-325 MG PO TABS
1.0000 | ORAL_TABLET | ORAL | Status: DC | PRN
  Administered 2013-11-11 – 2013-11-14 (×7): 1 via ORAL
  Filled 2013-11-11 (×8): qty 1

## 2013-11-11 NOTE — Progress Notes (Signed)
Subjective: She says she feels better this morning. She has no new complaints except constipation. She is typically having more diarrhea. She denies any chest pain. Her breathing is okay.  Objective: Vital signs in last 24 hours: Temp:  [97.5 F (36.4 C)-98.3 F (36.8 C)] 97.7 F (36.5 C) (08/29 0400) Pulse Rate:  [58-109] 59 (08/29 0900) Resp:  [16-25] 16 (08/29 0900) BP: (73-120)/(30-85) 93/46 mmHg (08/29 0900) SpO2:  [80 %-100 %] 99 % (08/29 0900) Weight:  [66 kg (145 lb 8.1 oz)] 66 kg (145 lb 8.1 oz) (08/29 0400) Weight change: 2 kg (4 lb 6.6 oz) Last BM Date: 11/09/13  Intake/Output from previous day: 08/28 0701 - 08/29 0700 In: 960 [P.O.:960] Out: 1075 [Urine:1075]  PHYSICAL EXAM General appearance: alert, cooperative and mild distress Resp: clear to auscultation bilaterally Cardio: Her heart is regular with a loud systolic murmur. By monitor she is paced GI: soft, non-tender; bowel sounds normal; no masses,  no organomegaly Extremities: 1+ edema  Lab Results:  Results for orders placed during the hospital encounter of 11/06/13 (from the past 48 hour(s))  URINALYSIS, ROUTINE W REFLEX MICROSCOPIC     Status: Abnormal   Collection Time    11/09/13  2:00 PM      Result Value Ref Range   Color, Urine YELLOW  YELLOW   APPearance CLEAR  CLEAR   Specific Gravity, Urine 1.020  1.005 - 1.030   pH 5.0  5.0 - 8.0   Glucose, UA NEGATIVE  NEGATIVE mg/dL   Hgb urine dipstick SMALL (*) NEGATIVE   Bilirubin Urine NEGATIVE  NEGATIVE   Ketones, ur NEGATIVE  NEGATIVE mg/dL   Protein, ur NEGATIVE  NEGATIVE mg/dL   Urobilinogen, UA 0.2  0.0 - 1.0 mg/dL   Nitrite NEGATIVE  NEGATIVE   Leukocytes, UA MODERATE (*) NEGATIVE  URINE MICROSCOPIC-ADD ON     Status: Abnormal   Collection Time    11/09/13  2:00 PM      Result Value Ref Range   Squamous Epithelial / LPF FEW (*) RARE   WBC, UA TOO NUMEROUS TO COUNT  <3 WBC/hpf   RBC / HPF 0-2  <3 RBC/hpf   Bacteria, UA MANY (*) RARE   BASIC METABOLIC PANEL     Status: Abnormal   Collection Time    11/10/13  5:20 AM      Result Value Ref Range   Sodium 141  137 - 147 mEq/L   Potassium 4.7  3.7 - 5.3 mEq/L   Chloride 103  96 - 112 mEq/L   CO2 27  19 - 32 mEq/L   Glucose, Bld 122 (*) 70 - 99 mg/dL   BUN 73 (*) 6 - 23 mg/dL   Creatinine, Ser 2.33 (*) 0.50 - 1.10 mg/dL   Calcium 8.1 (*) 8.4 - 10.5 mg/dL   GFR calc non Af Amer 17 (*) >90 mL/min   GFR calc Af Amer 19 (*) >90 mL/min   Comment: (NOTE)     The eGFR has been calculated using the CKD EPI equation.     This calculation has not been validated in all clinical situations.     eGFR's persistently <90 mL/min signify possible Chronic Kidney     Disease.   Anion gap 11  5 - 15  PROTIME-INR     Status: Abnormal   Collection Time    11/10/13  5:20 AM      Result Value Ref Range   Prothrombin Time 20.5 (*) 11.6 - 15.2  seconds   INR 1.76 (*) 0.00 - 1.49  PROTIME-INR     Status: Abnormal   Collection Time    11/11/13  5:38 AM      Result Value Ref Range   Prothrombin Time 20.0 (*) 11.6 - 15.2 seconds   INR 1.70 (*) 0.00 - 1.49  CBC WITH DIFFERENTIAL     Status: Abnormal   Collection Time    11/11/13  5:38 AM      Result Value Ref Range   WBC 9.6  4.0 - 10.5 K/uL   RBC 2.92 (*) 3.87 - 5.11 MIL/uL   Hemoglobin 9.3 (*) 12.0 - 15.0 g/dL   HCT 29.2 (*) 36.0 - 46.0 %   MCV 100.0  78.0 - 100.0 fL   MCH 31.8  26.0 - 34.0 pg   MCHC 31.8  30.0 - 36.0 g/dL   RDW 14.3  11.5 - 15.5 %   Platelets 186  150 - 400 K/uL   Neutrophils Relative % 55  43 - 77 %   Lymphocytes Relative 34  12 - 46 %   Monocytes Relative 7  3 - 12 %   Eosinophils Relative 3  0 - 5 %   Basophils Relative 1  0 - 1 %   Neutro Abs 5.2  1.7 - 7.7 K/uL   Lymphs Abs 3.3  0.7 - 4.0 K/uL   Monocytes Absolute 0.7  0.1 - 1.0 K/uL   Eosinophils Absolute 0.3  0.0 - 0.7 K/uL   Basophils Absolute 0.1  0.0 - 0.1 K/uL   WBC Morphology ATYPICAL LYMPHOCYTES    COMPREHENSIVE METABOLIC PANEL     Status:  Abnormal   Collection Time    11/11/13  5:38 AM      Result Value Ref Range   Sodium 140  137 - 147 mEq/L   Potassium 4.3  3.7 - 5.3 mEq/L   Chloride 101  96 - 112 mEq/L   CO2 26  19 - 32 mEq/L   Glucose, Bld 125 (*) 70 - 99 mg/dL   BUN 80 (*) 6 - 23 mg/dL   Creatinine, Ser 2.09 (*) 0.50 - 1.10 mg/dL   Calcium 8.4  8.4 - 10.5 mg/dL   Total Protein 5.8 (*) 6.0 - 8.3 g/dL   Albumin 2.9 (*) 3.5 - 5.2 g/dL   AST 22  0 - 37 U/L   ALT 15  0 - 35 U/L   Alkaline Phosphatase 145 (*) 39 - 117 U/L   Total Bilirubin 0.3  0.3 - 1.2 mg/dL   GFR calc non Af Amer 19 (*) >90 mL/min   GFR calc Af Amer 22 (*) >90 mL/min   Comment: (NOTE)     The eGFR has been calculated using the CKD EPI equation.     This calculation has not been validated in all clinical situations.     eGFR's persistently <90 mL/min signify possible Chronic Kidney     Disease.   Anion gap 13  5 - 15    ABGS No results found for this basename: PHART, PCO2, PO2ART, TCO2, HCO3,  in the last 72 hours CULTURES Recent Results (from the past 240 hour(s))  MRSA PCR SCREENING     Status: None   Collection Time    11/07/13  1:10 AM      Result Value Ref Range Status   MRSA by PCR NEGATIVE  NEGATIVE Final   Comment:            The  GeneXpert MRSA Assay (FDA     approved for NASAL specimens     only), is one component of a     comprehensive MRSA colonization     surveillance program. It is not     intended to diagnose MRSA     infection nor to guide or     monitor treatment for     MRSA infections.   Studies/Results: No results found.  Medications:  Prior to Admission:  Prescriptions prior to admission  Medication Sig Dispense Refill  . acetaminophen (TYLENOL) 500 MG tablet Take 1 tablet (500 mg total) by mouth 4 (four) times daily.  30 tablet  1  . losartan (COZAAR) 100 MG tablet Take 100 mg by mouth daily.      . metolazone (ZAROXOLYN) 2.5 MG tablet Take 1 tablet (2.5 mg total) by mouth as needed. Take 1 tablet by mouth  as needed for leg swelling/SOB  30 tablet  2  . metoprolol tartrate (LOPRESSOR) 25 MG tablet Take 25 mg by mouth 2 (two) times daily.      . Multiple Vitamins-Minerals (MULTIVITAMIN WITH MINERALS) tablet Take 1 tablet by mouth daily.      . nitroGLYCERIN (NITROSTAT) 0.4 MG SL tablet Place 1 tablet (0.4 mg total) under the tongue every 5 (five) minutes as needed for chest pain.  24 tablet  4  . pravastatin (PRAVACHOL) 40 MG tablet Take 40 mg by mouth at bedtime.      . torsemide (DEMADEX) 20 MG tablet Take 10 mg by mouth daily.      . verapamil (CALAN-SR) 180 MG CR tablet Take 180 mg by mouth at bedtime.      [name] warfarin (COUMADIN) 2.5 MG tablet Take 1.25-2.5 mg by mouth daily. Patient takes 1.46m on Mon.,& Thurs. And 2.535mall other days       Scheduled: . antiseptic oral rinse  7 mL Mouth Rinse BID  . aspirin EC  81 mg Oral Daily  . metoprolol tartrate  25 mg Oral BID  . sodium chloride  3 mL Intravenous Q12H  . verapamil  180 mg Oral QHS  . Warfarin - Pharmacist Dosing Inpatient   Does not apply Q24H   Continuous: . nitroGLYCERIN Stopped (11/08/13 0100)   PRMCN:OBSJGGhloride, acetaminophen, albuterol, HYDROcodone-acetaminophen, ondansetron (ZOFRAN) IV, sodium chloride  Assesment: She has acute on chronic diastolic congestive heart failure. He is better. She has a much less shortness of breath and edema.  She had a non-STEMI. She denies any further chest pain.  She has a history of atrial fibrillation and of pacemaker implantation and that appears to be stable now.  She has aortic stenosis which is stable a pretty severe and she is not a surgical candidate  She has acute on chronic renal failure which is better today.   She has cirrhosis of the liver and had significant ascites which was okay based on CT  At baseline she has chronic lymphocytic leukemia and is mildly anemic at baseline  She has been having GI bleeding but is not really a candidate for evaluation of that right  now Principal Problem:   Acute on chronic diastolic congestive heart failure Active Problems:   HYPERTENSION   Arteriosclerotic cardiovascular disease (ASCVD)   Chronic anticoagulation   Aortic stenosis   Chronic kidney disease   Gastroesophageal reflux disease   Chronic lymphocytic leukemia   Paroxysmal atrial fibrillation   Pacemaker   Chest pain   CHF (congestive heart failure)   NSTEMI (non-ST  elevated myocardial infarction)   Cirrhosis of liver without mention of alcohol    Plan: I think she's okay to be transferred from the ICU now. Continue with her treatments. Hold diuresis today and probably start back tomorrow if her renal function continues to improve    LOS: 5 days   Amarea Macdowell L 11/11/2013, 10:03 AM

## 2013-11-11 NOTE — Progress Notes (Signed)
PT IS TRANSFERRING TO ROOM 307. ALERT AND ORIENTED. O2 SAT 98% ON 2.5L/[place]. DENIES ANY SOB.HR IN THE 60'S. PACEMAKER: VENTRICULAR MODE FUNCTIONING; ATRIAL MODE IS NOT. RT HAND NSL IS PATENT.  BILATERAL 1+ LOWER EXTREMITY EDEMA. TRANSFER REPORT CALLED TO JOANNA RN ON 300.

## 2013-11-11 NOTE — Plan of Care (Signed)
Problem: Discharge Progression Outcomes Goal: Able to perform self care activities Outcome: Progressing PT IS NOW ABLE TO PARTICIPATE IN HER PERSONAL CARE.

## 2013-11-11 NOTE — Progress Notes (Signed)
  Subjective:  Patient denies chest pain but she complains of neck pain as well as left posterolateral chest pain. She states she's had this pain as well as neck pain off and on for a while. She denies dyspnea at rest but she gets short of breath when she walks a few steps of commode. She had 2 bowel movements yesterday. She noted blood on the tissue. She denies frank rectal bleeding or melena.   Objective: Blood pressure 92/40, pulse 65, temperature 97.7 F (36.5 C), temperature source Axillary, resp. rate 19, height 5' (1.524 m), weight 145 lb 8.1 oz (66 kg), SpO2 93.00%. Patient is alert and appears to be comfortable sitting and reclining chair. Abdomen is full but soft. She has mild tenderness at LLQ and LUQ. She has firm pulsatile liver. She has 1+ pitting edema involving both legs. Lower extremity edema has decreased since admission.  Labs/studies Results:   Recent Labs  11/09/13 0509 11/11/13 0538  WBC 8.7 9.6  HGB 9.5* 9.3*  HCT 31.0* 29.2*  PLT 173 186    BMET   Recent Labs  11/09/13 0509 11/10/13 0520 11/11/13 0538  NA 140 141 140  K 5.5* 4.7 4.3  CL 103 103 101  CO2 28 27 26   GLUCOSE 122* 122* 125*  BUN 64* 73* 80*  CREATININE 2.16* 2.33* 2.09*  CALCIUM 8.6 8.1* 8.4    LFT   Recent Labs  11/11/13 0538  PROT 5.8*  ALBUMIN 2.9*  AST 22  ALT 15  ALKPHOS 145*  BILITOT 0.3    PT/INR   Recent Labs  11/10/13 0520 11/11/13 0538  LABPROT 20.5* 20.0*  INR 1.76* 1.70*    HCV antibody is nonreactive. Smooth muscle antibody is 17(normal). ANA is negative. AMA is negative. AFP is 1.9   Assessment:  #1. Ascites felt to be secondary to CHF and cardiac cirrhosis. S/P LVAP 12 days ago with removal of over 3 L of fluid. Abdominal exam suggests slow accumulation of ascites. #2. Cirrhosis. 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 Studies for hepatitis B, C. autoimmune liver disease and PBC are negative. AFP is normal. #3. Rectal bleeding. Gradual drop in  H&H. No evidence of significant bleed. No workup planned for now. #4. History of duodenal adenoma. Patient presently asymptomatic. #5. Intermittent left-sided abdominal pain felt to be due to IBS. #6. NSTEMI. Patient being managed medically. She is high risk for intervention. #7. Aortic stenosis. #8.CKD.  Recommendations;  Will continue to monitor H&H while hospitalized. Abdominal paracenteses on as-needed basis.

## 2013-11-11 NOTE — Progress Notes (Signed)
[place] for Coumadin Indication: atrial fibrillation  Allergies  Allergen Reactions  . Vesicare [Solifenacin] Other (See Comments)    Severe stomach pain  . Detrol [Tolterodine Tartrate] Other (See Comments)    Patient states: "I was told to never take it again, I do not recall the reaction"  . [place] [Trospium Chloride] Nausea Only and Other (See Comments)    Intense lower abd pain  . Sulfa Antibiotics Other (See Comments)    Extreme dizziness  . Sulfonamide Derivatives Other (See Comments)    REACTION: GI distress  . Trimethoprim     Other reaction(s): Dermatitis    Patient Measurements: Height: 5' (152.4 cm) Weight: 145 lb 8.1 oz (66 kg) IBW/kg (Calculated) : 45.5  Vital Signs: Temp: 97.7 F (36.5 C) (08/29 0400) BP: 93/46 mmHg (08/29 0900) Pulse Rate: 59 (08/29 0900)  Labs:  Recent Labs  11/09/13 0509 11/10/13 0520 11/11/13 0538  HGB 9.5*  --  9.3*  HCT 31.0*  --  29.2*  PLT 173  --  186  LABPROT 23.1* 20.5* 20.0*  INR 2.05* 1.76* 1.70*  CREATININE 2.16* 2.33* 2.09*    Estimated Creatinine Clearance: 13.6 ml/min (by C-G formula based on Cr of 2.09).   Medical History: Past Medical History  Diagnosis Date  . Sick sinus syndrome     Dual-chamber pacemaker in 11/2006  . Aortic stenosis     Moderate to severe  . Left bundle branch block   . Chronic diastolic heart failure   . Coronary atherosclerosis of native coronary artery     Cardiac catheterization 10/09: 50% left anterior descending; 80% PDA;  . Epistaxis     Mild with negative ENT evaluation  . Dizziness     Chronic  . Osteoarthritis   . Irritable bowel syndrome   . Urinary frequency   . Gastroesophageal reflux disease     Esophageal dilatation for stricture in 09/2011  . Rosacea   . Chronic lymphocytic leukemia     Stage I with anemia H./H. of 9/27 in 11/09 with normal MCV; iron deficiency in bone marrow  . Orthostatic hypotension   .  Diverticulosis   . Adenomatous polyp October 2010    (Proximal Small bowel) with high grade dysplasia in polypoid lesion straddling D1/D2  . CKD (chronic kidney disease) stage 3, GFR 30-59 ml/min   . Urethral caruncle   . Iron deficiency anemia secondary to blood loss (chronic) 01/19/2013  . History of rectal bleeding 01/19/2013  . PAF (paroxysmal atrial fibrillation)   . Mitral regurgitation     Moderate to severe    Medications:  Prescriptions prior to admission  Medication Sig Dispense Refill  . acetaminophen (TYLENOL) 500 MG tablet Take 1 tablet (500 mg total) by mouth 4 (four) times daily.  30 tablet  1  . losartan (COZAAR) 100 MG tablet Take 100 mg by mouth daily.      . metolazone (ZAROXOLYN) 2.5 MG tablet Take 1 tablet (2.5 mg total) by mouth as needed. Take 1 tablet by mouth as needed for leg swelling/SOB  30 tablet  2  . metoprolol tartrate (LOPRESSOR) 25 MG tablet Take 25 mg by mouth 2 (two) times daily.      . Multiple Vitamins-Minerals (MULTIVITAMIN WITH MINERALS) tablet Take 1 tablet by mouth daily.      . nitroGLYCERIN (NITROSTAT) 0.4 MG SL tablet Place 1 tablet (0.4 mg total) under the tongue every 5 (five) minutes as needed for chest pain.  24  tablet  4  . pravastatin (PRAVACHOL) 40 MG tablet Take 40 mg by mouth at bedtime.      . torsemide (DEMADEX) 20 MG tablet Take 10 mg by mouth daily.      . verapamil (CALAN-SR) 180 MG CR tablet Take 180 mg by mouth at bedtime.      [name] warfarin (COUMADIN) 2.5 MG tablet Take 1.25-2.5 mg by mouth daily. Patient takes 1.25mg  on Mon.,& Thurs. And 2.5mg  all other days        Assessment: 89 yoF on chronic Coumadin for Afib.  Home dose listed above.  INR therapeutic on admission but now below goal for second consecutive day. She was admitted with NSTEMI and Coumadin initially held.  No plans for intervention so Coumadin resumed. No bleeding noted.   Goal of Therapy:  INR 2-3   Plan:  Repeat Coumadin 3mg  po x1 today. Daily INR for  now  [name] 11/11/2013,10:12 AM

## 2013-11-12 LAB — BASIC METABOLIC PANEL
ANION GAP: 13 (ref 5–15)
BUN: 86 mg/dL — AB (ref 6–23)
CO2: 25 mEq/L (ref 19–32)
Calcium: 8.4 mg/dL (ref 8.4–10.5)
Chloride: 102 mEq/L (ref 96–112)
Creatinine, Ser: 2.1 mg/dL — ABNORMAL HIGH (ref 0.50–1.10)
GFR calc non Af Amer: 19 mL/min — ABNORMAL LOW (ref >90)
GFR, EST AFRICAN AMERICAN: 22 mL/min — AB (ref >90)
Glucose, Bld: 122 mg/dL — ABNORMAL HIGH (ref 70–99)
Potassium: 4.5 mEq/L (ref 3.7–5.3)
Sodium: 140 mEq/L (ref 137–147)

## 2013-11-12 LAB — PROTIME-INR
INR: 2 — AB (ref 0.00–1.49)
PROTHROMBIN TIME: 22.7 s — AB (ref 11.6–15.2)

## 2013-11-12 LAB — CBC WITH DIFFERENTIAL/PLATELET
BASOS PCT: 1 % (ref 0–1)
Basophils Absolute: 0.1 10*3/uL (ref 0.0–0.1)
Eosinophils Absolute: 0.3 10*3/uL (ref 0.0–0.7)
Eosinophils Relative: 3 % (ref 0–5)
HCT: 28.5 % — ABNORMAL LOW (ref 36.0–46.0)
Hemoglobin: 9 g/dL — ABNORMAL LOW (ref 12.0–15.0)
Lymphocytes Relative: 30 % (ref 12–46)
Lymphs Abs: 2.6 10*3/uL (ref 0.7–4.0)
MCH: 31.7 pg (ref 26.0–34.0)
MCHC: 31.6 g/dL (ref 30.0–36.0)
MCV: 100.4 fL — ABNORMAL HIGH (ref 78.0–100.0)
Monocytes Absolute: 0.6 10*3/uL (ref 0.1–1.0)
Monocytes Relative: 7 % (ref 3–12)
NEUTROS ABS: 5.3 10*3/uL (ref 1.7–7.7)
NEUTROS PCT: 59 % (ref 43–77)
PLATELETS: 182 10*3/uL (ref 150–400)
RBC: 2.84 MIL/uL — ABNORMAL LOW (ref 3.87–5.11)
RDW: 14.1 % (ref 11.5–15.5)
WBC: 8.8 10*3/uL (ref 4.0–10.5)

## 2013-11-12 MED ORDER — WARFARIN SODIUM 2 MG PO TABS
2.0000 mg | ORAL_TABLET | Freq: Once | ORAL | Status: AC
  Administered 2013-11-12: 2 mg via ORAL
  Filled 2013-11-12: qty 1

## 2013-11-12 MED ORDER — POLYETHYLENE GLYCOL 3350 17 G PO PACK
17.0000 g | PACK | Freq: Every day | ORAL | Status: DC
  Administered 2013-11-12 – 2013-11-14 (×3): 17 g via ORAL
  Filled 2013-11-12 (×3): qty 1

## 2013-11-12 MED ORDER — TORSEMIDE 20 MG PO TABS
10.0000 mg | ORAL_TABLET | Freq: Every day | ORAL | Status: DC
  Administered 2013-11-12 – 2013-11-14 (×3): 10 mg via ORAL
  Filled 2013-11-12 (×3): qty 1

## 2013-11-12 NOTE — Progress Notes (Signed)
[place] for Coumadin Indication: atrial fibrillation  Allergies  Allergen Reactions  . Vesicare [Solifenacin] Other (See Comments)    Severe stomach pain  . Detrol [Tolterodine Tartrate] Other (See Comments)    Patient states: "I was told to never take it again, I do not recall the reaction"  . [place] [Trospium Chloride] Nausea Only and Other (See Comments)    Intense lower abd pain  . Sulfa Antibiotics Other (See Comments)    Extreme dizziness  . Sulfonamide Derivatives Other (See Comments)    REACTION: GI distress  . Trimethoprim     Other reaction(s): Dermatitis    Patient Measurements: Height: 5' (152.4 cm) Weight: 144 lb 9.6 oz (65.59 kg) IBW/kg (Calculated) : 45.5  Vital Signs: Temp: 97.4 F (36.3 C) (08/30 0509) Temp src: Oral (08/30 0509) BP: 96/45 mmHg (08/30 0908) Pulse Rate: 60 (08/30 0908)  Labs:  Recent Labs  11/10/13 0520 11/11/13 0538 11/12/13 0607  HGB  --  9.3* 9.0*  HCT  --  29.2* 28.5*  PLT  --  186 182  LABPROT 20.5* 20.0* 22.7*  INR 1.76* 1.70* 2.00*  CREATININE 2.33* 2.09* 2.10*    Estimated Creatinine Clearance: 13.5 ml/min (by C-G formula based on Cr of 2.1).   Medical History: Past Medical History  Diagnosis Date  . Sick sinus syndrome     Dual-chamber pacemaker in 11/2006  . Aortic stenosis     Moderate to severe  . Left bundle branch block   . Chronic diastolic heart failure   . Coronary atherosclerosis of native coronary artery     Cardiac catheterization 10/09: 50% left anterior descending; 80% PDA;  . Epistaxis     Mild with negative ENT evaluation  . Dizziness     Chronic  . Osteoarthritis   . Irritable bowel syndrome   . Urinary frequency   . Gastroesophageal reflux disease     Esophageal dilatation for stricture in 09/2011  . Rosacea   . Chronic lymphocytic leukemia     Stage I with anemia H./H. of 9/27 in 11/09 with normal MCV; iron deficiency in bone marrow  .  Orthostatic hypotension   . Diverticulosis   . Adenomatous polyp October 2010    (Proximal Small bowel) with high grade dysplasia in polypoid lesion straddling D1/D2  . CKD (chronic kidney disease) stage 3, GFR 30-59 ml/min   . Urethral caruncle   . Iron deficiency anemia secondary to blood loss (chronic) 01/19/2013  . History of rectal bleeding 01/19/2013  . PAF (paroxysmal atrial fibrillation)   . Mitral regurgitation     Moderate to severe    Medications:  Prescriptions prior to admission  Medication Sig Dispense Refill  . acetaminophen (TYLENOL) 500 MG tablet Take 1 tablet (500 mg total) by mouth 4 (four) times daily.  30 tablet  1  . losartan (COZAAR) 100 MG tablet Take 100 mg by mouth daily.      . metolazone (ZAROXOLYN) 2.5 MG tablet Take 1 tablet (2.5 mg total) by mouth as needed. Take 1 tablet by mouth as needed for leg swelling/SOB  30 tablet  2  . metoprolol tartrate (LOPRESSOR) 25 MG tablet Take 25 mg by mouth 2 (two) times daily.      . Multiple Vitamins-Minerals (MULTIVITAMIN WITH MINERALS) tablet Take 1 tablet by mouth daily.      . nitroGLYCERIN (NITROSTAT) 0.4 MG SL tablet Place 1 tablet (0.4 mg total) under the tongue every 5 (five) minutes as needed  for chest pain.  24 tablet  4  . pravastatin (PRAVACHOL) 40 MG tablet Take 40 mg by mouth at bedtime.      . torsemide (DEMADEX) 20 MG tablet Take 10 mg by mouth daily.      . verapamil (CALAN-SR) 180 MG CR tablet Take 180 mg by mouth at bedtime.      [name] warfarin (COUMADIN) 2.5 MG tablet Take 1.25-2.5 mg by mouth daily. Patient takes 1.25mg  on Mon.,& Thurs. And 2.5mg  all other days        Assessment: 89 yoF on chronic Coumadin for Afib.  Home dose listed above.  INR now therapeutic. She was admitted with NSTEMI and Coumadin initially held.  No plans for intervention so Coumadin resumed. Recent rectal bleeding noted, Hg 9.0. GI note reviewed  Goal of Therapy:  INR 2-3   Plan:  Coumadin 2 mg po x1 today. Daily INR for  now  [name] 11/12/2013,11:40 AM

## 2013-11-12 NOTE — Progress Notes (Signed)
Subjective: She says she feels okay. She has no new complaints. She still has pain in her back and has pain in her neck. She denies any chest pain. Her breathing is pretty good until she starts ambulating  Objective: Vital signs in last 24 hours: Temp:  [97.4 F (36.3 C)-98.6 F (37 C)] 97.4 F (36.3 C) (08/30 0509) Pulse Rate:  [37-110] 60 (08/30 0908) Resp:  [13-21] 20 (08/30 0509) BP: (76-120)/(32-50) 96/45 mmHg (08/30 0908) SpO2:  [75 %-100 %] 97 % (08/30 0509) Weight:  [65.59 kg (144 lb 9.6 oz)] 65.59 kg (144 lb 9.6 oz) (08/30 0509) Weight change: -0.41 kg (-14.5 oz) Last BM Date: 11/10/13  Intake/Output from previous day: 08/29 0701 - 08/30 0700 In: 840 [P.O.:840] Out: 900 [Urine:900]  PHYSICAL EXAM General appearance: alert, cooperative and mild distress Resp: rhonchi bilaterally Cardio: Her heart is regular. I don't appear gallop but she does have a systolic murmur GI: soft, non-tender; bowel sounds normal; no masses,  no organomegaly Extremities: extremities normal, atraumatic, no cyanosis or edema  Lab Results:  Results for orders placed during the hospital encounter of 11/06/13 (from the past 48 hour(s))  PROTIME-INR     Status: Abnormal   Collection Time    11/11/13  5:38 AM      Result Value Ref Range   Prothrombin Time 20.0 (*) 11.6 - 15.2 seconds   INR 1.70 (*) 0.00 - 1.49  CBC WITH DIFFERENTIAL     Status: Abnormal   Collection Time    11/11/13  5:38 AM      Result Value Ref Range   WBC 9.6  4.0 - 10.5 K/uL   RBC 2.92 (*) 3.87 - 5.11 MIL/uL   Hemoglobin 9.3 (*) 12.0 - 15.0 g/dL   HCT 29.2 (*) 36.0 - 46.0 %   MCV 100.0  78.0 - 100.0 fL   MCH 31.8  26.0 - 34.0 pg   MCHC 31.8  30.0 - 36.0 g/dL   RDW 14.3  11.5 - 15.5 %   Platelets 186  150 - 400 K/uL   Neutrophils Relative % 55  43 - 77 %   Lymphocytes Relative 34  12 - 46 %   Monocytes Relative 7  3 - 12 %   Eosinophils Relative 3  0 - 5 %   Basophils Relative 1  0 - 1 %   Neutro Abs 5.2  1.7 -  7.7 K/uL   Lymphs Abs 3.3  0.7 - 4.0 K/uL   Monocytes Absolute 0.7  0.1 - 1.0 K/uL   Eosinophils Absolute 0.3  0.0 - 0.7 K/uL   Basophils Absolute 0.1  0.0 - 0.1 K/uL   WBC Morphology ATYPICAL LYMPHOCYTES    COMPREHENSIVE METABOLIC PANEL     Status: Abnormal   Collection Time    11/11/13  5:38 AM      Result Value Ref Range   Sodium 140  137 - 147 mEq/L   Potassium 4.3  3.7 - 5.3 mEq/L   Chloride 101  96 - 112 mEq/L   CO2 26  19 - 32 mEq/L   Glucose, Bld 125 (*) 70 - 99 mg/dL   BUN 80 (*) 6 - 23 mg/dL   Creatinine, Ser 2.09 (*) 0.50 - 1.10 mg/dL   Calcium 8.4  8.4 - 10.5 mg/dL   Total Protein 5.8 (*) 6.0 - 8.3 g/dL   Albumin 2.9 (*) 3.5 - 5.2 g/dL   AST 22  0 - 37 U/L   ALT  15  0 - 35 U/L   Alkaline Phosphatase 145 (*) 39 - 117 U/L   Total Bilirubin 0.3  0.3 - 1.2 mg/dL   GFR calc non Af Amer 19 (*) >90 mL/min   GFR calc Af Amer 22 (*) >90 mL/min   Comment: (NOTE)     The eGFR has been calculated using the CKD EPI equation.     This calculation has not been validated in all clinical situations.     eGFR's persistently <90 mL/min signify possible Chronic Kidney     Disease.   Anion gap 13  5 - 15  PROTIME-INR     Status: Abnormal   Collection Time    11/12/13  6:07 AM      Result Value Ref Range   Prothrombin Time 22.7 (*) 11.6 - 15.2 seconds   INR 2.00 (*) 0.00 - 3.76  BASIC METABOLIC PANEL     Status: Abnormal   Collection Time    11/12/13  6:07 AM      Result Value Ref Range   Sodium 140  137 - 147 mEq/L   Potassium 4.5  3.7 - 5.3 mEq/L   Chloride 102  96 - 112 mEq/L   CO2 25  19 - 32 mEq/L   Glucose, Bld 122 (*) 70 - 99 mg/dL   BUN 86 (*) 6 - 23 mg/dL   Creatinine, Ser 2.10 (*) 0.50 - 1.10 mg/dL   Calcium 8.4  8.4 - 10.5 mg/dL   GFR calc non Af Amer 19 (*) >90 mL/min   GFR calc Af Amer 22 (*) >90 mL/min   Comment: (NOTE)     The eGFR has been calculated using the CKD EPI equation.     This calculation has not been validated in all clinical situations.      eGFR's persistently <90 mL/min signify possible Chronic Kidney     Disease.   Anion gap 13  5 - 15  CBC WITH DIFFERENTIAL     Status: Abnormal   Collection Time    11/12/13  6:07 AM      Result Value Ref Range   WBC 8.8  4.0 - 10.5 K/uL   RBC 2.84 (*) 3.87 - 5.11 MIL/uL   Hemoglobin 9.0 (*) 12.0 - 15.0 g/dL   HCT 28.5 (*) 36.0 - 46.0 %   MCV 100.4 (*) 78.0 - 100.0 fL   MCH 31.7  26.0 - 34.0 pg   MCHC 31.6  30.0 - 36.0 g/dL   RDW 14.1  11.5 - 15.5 %   Platelets 182  150 - 400 K/uL   Neutrophils Relative % 59  43 - 77 %   Neutro Abs 5.3  1.7 - 7.7 K/uL   Lymphocytes Relative 30  12 - 46 %   Lymphs Abs 2.6  0.7 - 4.0 K/uL   Monocytes Relative 7  3 - 12 %   Monocytes Absolute 0.6  0.1 - 1.0 K/uL   Eosinophils Relative 3  0 - 5 %   Eosinophils Absolute 0.3  0.0 - 0.7 K/uL   Basophils Relative 1  0 - 1 %   Basophils Absolute 0.1  0.0 - 0.1 K/uL    ABGS No results found for this basename: PHART, PCO2, PO2ART, TCO2, HCO3,  in the last 72 hours CULTURES Recent Results (from the past 240 hour(s))  MRSA PCR SCREENING     Status: None   Collection Time    11/07/13  1:10 AM  Result Value Ref Range Status   MRSA by PCR NEGATIVE  NEGATIVE Final   Comment:            The GeneXpert MRSA Assay (FDA     approved for NASAL specimens     only), is one component of a     comprehensive MRSA colonization     surveillance program. It is not     intended to diagnose MRSA     infection nor to guide or     monitor treatment for     MRSA infections.   Studies/Results: No results found.  Medications:  Prior to Admission:  Prescriptions prior to admission  Medication Sig Dispense Refill  . acetaminophen (TYLENOL) 500 MG tablet Take 1 tablet (500 mg total) by mouth 4 (four) times daily.  30 tablet  1  . losartan (COZAAR) 100 MG tablet Take 100 mg by mouth daily.      . metolazone (ZAROXOLYN) 2.5 MG tablet Take 1 tablet (2.5 mg total) by mouth as needed. Take 1 tablet by mouth as needed  for leg swelling/SOB  30 tablet  2  . metoprolol tartrate (LOPRESSOR) 25 MG tablet Take 25 mg by mouth 2 (two) times daily.      . Multiple Vitamins-Minerals (MULTIVITAMIN WITH MINERALS) tablet Take 1 tablet by mouth daily.      . nitroGLYCERIN (NITROSTAT) 0.4 MG SL tablet Place 1 tablet (0.4 mg total) under the tongue every 5 (five) minutes as needed for chest pain.  24 tablet  4  . pravastatin (PRAVACHOL) 40 MG tablet Take 40 mg by mouth at bedtime.      . torsemide (DEMADEX) 20 MG tablet Take 10 mg by mouth daily.      . verapamil (CALAN-SR) 180 MG CR tablet Take 180 mg by mouth at bedtime.      [name] warfarin (COUMADIN) 2.5 MG tablet Take 1.25-2.5 mg by mouth daily. Patient takes 1.85m on Mon.,& Thurs. And 2.571mall other days       Scheduled: . antiseptic oral rinse  7 mL Mouth Rinse BID  . aspirin EC  81 mg Oral Daily  . metoprolol tartrate  25 mg Oral BID  . polyethylene glycol  17 g Oral Daily  . sodium chloride  3 mL Intravenous Q12H  . torsemide  10 mg Oral Daily  . verapamil  180 mg Oral QHS  . Warfarin - Pharmacist Dosing Inpatient   Does not apply Q24H   Continuous: . nitroGLYCERIN Stopped (11/08/13 0100)   PRLAG:TXMIWOhloride, acetaminophen, albuterol, HYDROcodone-acetaminophen, ondansetron (ZOFRAN) IV, sodium chloride  Assesment: She was admitted with acute on chronic diastolic heart failure related to a non-STEMI. She has improved but diuresis caused her to have acute on chronic renal failure. This may also be related to her heart failure and poor perfusion. Her diuretics have been held and her renal function seems to stabilized. She has aortic stenosis and is not a candidate for surgery. She has GERD and has been having GI bleeding but there were no plans for evaluation of that right now. She has cirrhosis of the liver and had significant ascites which has resolved by CT earlier in the week. She has baseline chronic lymphocytic leukemia which is not giving her any new  problems Principal Problem:   Acute on chronic diastolic congestive heart failure Active Problems:   HYPERTENSION   Arteriosclerotic cardiovascular disease (ASCVD)   Chronic anticoagulation   Aortic stenosis   Chronic kidney disease   Gastroesophageal  reflux disease   Chronic lymphocytic leukemia   Paroxysmal atrial fibrillation   Pacemaker   Chest pain   CHF (congestive heart failure)   NSTEMI (non-ST elevated myocardial infarction)   Cirrhosis of liver without mention of alcohol    Plan: Continue current treatments potential discharge in the next 48 hours. I am going to restart diuresis    LOS: 6 days   Thomas Rhude L 11/12/2013, 9:55 AM

## 2013-11-12 NOTE — Progress Notes (Signed)
  Subjective:  Patient continues to complain of exertional dyspnea. She denies retrosternal pain but she has pain in left lower rib cage on the left side laterally. She has good appetite. She continues to have intermittent LLQ pain. She did does not feel her abdomen is distended. She denies melena or rectal bleeding.  Objective: BP 96/45  Pulse 60  Temp(Src) 97.4 F (36.3 C) (Oral)  Resp 20  Ht 5' (1.524 m)  Wt 144 lb 9.6 oz (65.59 kg)  BMI 28.24 kg/m2  SpO2 97% Patient is alert and appears to be comfortable in bed. Abdomen is a slightly more distended than it was yesterday. It is soft with mild tenderness in LLQ and firm liver. Lower extremity edema remains unchanged since yesterday.  Labs/studies Results:   Recent Labs  11/11/13 0538 11/12/13 0607  WBC 9.6 8.8  HGB 9.3* 9.0*  HCT 29.2* 28.5*  PLT 186 182    BMET   Recent Labs  11/10/13 0520 11/11/13 0538 11/12/13 0607  NA 141 140 140  K 4.7 4.3 4.5  CL 103 101 102  CO2 27 26 25   GLUCOSE 122* 125* 122*  BUN 73* 80* 86*  CREATININE 2.33* 2.09* 2.10*  CALCIUM 8.1* 8.4 8.4     PT/INR   Recent Labs  11/11/13 0538 11/12/13 0607  LABPROT 20.0* 22.7*  INR 1.70* 2.00*      Assessment:  #1. Ascites felt to be secondary to CHF and cardiac cirrhosis. S/P LVAP 13 days ago with removal of over 3 L of fluid. Abdomen is little more distended than was yesterday indicating relapse of ascites but not to the point that he needs to be tapped. #2. Cirrhosis. 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 Biochemical markers for common causes are negative. #3. Rectal bleeding. Hemoglobin is down to 9 g. #4. History of duodenal adenoma. Patient presently asymptomatic. #5. Intermittent left-sided abdominal pain felt to be due to IBS. #6. NSTEMI. Patient being managed medically. She is high risk for intervention. #7. Aortic stenosis. #8.CKD. Renal function is stable.  Recommendations;  CBC in a.m. Abdominal paracenteses on  as-needed basis.

## 2013-11-13 DIAGNOSIS — Z7901 Long term (current) use of anticoagulants: Secondary | ICD-10-CM

## 2013-11-13 DIAGNOSIS — K625 Hemorrhage of anus and rectum: Secondary | ICD-10-CM

## 2013-11-13 LAB — CBC
HCT: 29.7 % — ABNORMAL LOW (ref 36.0–46.0)
Hemoglobin: 9.6 g/dL — ABNORMAL LOW (ref 12.0–15.0)
MCH: 32.4 pg (ref 26.0–34.0)
MCHC: 32.3 g/dL (ref 30.0–36.0)
MCV: 100.3 fL — ABNORMAL HIGH (ref 78.0–100.0)
PLATELETS: 197 10*3/uL (ref 150–400)
RBC: 2.96 MIL/uL — AB (ref 3.87–5.11)
RDW: 14.2 % (ref 11.5–15.5)
WBC: 9.2 10*3/uL (ref 4.0–10.5)

## 2013-11-13 LAB — BASIC METABOLIC PANEL
Anion gap: 12 (ref 5–15)
BUN: 88 mg/dL — AB (ref 6–23)
CO2: 27 mEq/L (ref 19–32)
Calcium: 8.8 mg/dL (ref 8.4–10.5)
Chloride: 102 mEq/L (ref 96–112)
Creatinine, Ser: 2.06 mg/dL — ABNORMAL HIGH (ref 0.50–1.10)
GFR calc Af Amer: 22 mL/min — ABNORMAL LOW (ref >90)
GFR calc non Af Amer: 19 mL/min — ABNORMAL LOW (ref >90)
Glucose, Bld: 118 mg/dL — ABNORMAL HIGH (ref 70–99)
POTASSIUM: 4.8 meq/L (ref 3.7–5.3)
SODIUM: 141 meq/L (ref 137–147)

## 2013-11-13 LAB — PROTIME-INR
INR: 2.18 — AB (ref 0.00–1.49)
PROTHROMBIN TIME: 24.3 s — AB (ref 11.6–15.2)

## 2013-11-13 MED ORDER — BISACODYL 10 MG RE SUPP
10.0000 mg | Freq: Once | RECTAL | Status: AC
  Administered 2013-11-13: 10 mg via RECTAL
  Filled 2013-11-13: qty 1

## 2013-11-13 MED ORDER — WARFARIN SODIUM 1 MG PO TABS
1.5000 mg | ORAL_TABLET | Freq: Once | ORAL | Status: AC
  Administered 2013-11-13: 1.5 mg via ORAL
  Filled 2013-11-13: qty 2

## 2013-11-13 NOTE — Progress Notes (Signed)
Ambulated in hallway to monitor Oxygen saturation: At rest (room air): 96% While ambulating: 89% At rest (after ambulating): 95% With oxygen (2.5 L): 100%  Carolin Guernsey, RN

## 2013-11-13 NOTE — Progress Notes (Signed)
INITIAL NUTRITION ASSESSMENT  DOCUMENTATION CODES Per approved criteria  -Not Applicable   INTERVENTION: Continue Heart Healthy diet and encourage meal intake   Ensure Complete po BID, each supplement provides 350 kcal and 13 grams of protein   Low Sodium diet education provided  NUTRITION DIAGNOSIS:  Inadequate oral intake related to acute on chronic heart failure as evidenced by  pt diet hx .   Goal: Pt to meet >/= 90% of their estimated nutrition needs     Monitor: Po intake, labs and wt trends    Reason for Assessment: Length of Stay  80 y.o. female  Admitting Dx: Acute on chronic diastolic congestive heart failure  ASSESSMENT: 80 yo female h/o diastolic chf, moderate aortic stenosis, presents with several days of worsening SOB.   Pt is up in chair during visit. She reports decreased po intake prior to admission with increased difficulty breathing. Home diet is regular with consistent intake of high sodium foods such as frozen meals and canned soups. We discussed alternate food choices to help decrease daily sodium intake overall. She reports to be within usual weight range. She weighs herself daily at home. She c/o constipation (abdomen distended). Her meal intake during hospitalization averaging 50-100%.  Diet hx: Breakfast-Rice crispies with berries and coffee Lunch-canned soup or deli sandwich and sweet tea Dinner-Frozen meal with sweet tea Snack- saltine crackers with tuna in the evening before bed  Labs: BUN trending up, Cr. 2.06   Nutrition Focused Physical Exam:  Subcutaneous Fat:  Orbital Region: mild wasting Upper Arm Region: WDL Thoracic and Lumbar Region: WDL  Muscle:  Temple Region: mild wasting Clavicle Bone Region: mild- moderate wasting Clavicle and Acromion Bone Region: mild wasting Scapular Bone Region: mild wasting Dorsal Hand: moderate wasting Patellar Region: mild wasting Anterior Thigh Region: moderate wasting Posterior Calf Region:  WDL  Edema: non-pitting      Height: Ht Readings from Last 1 Encounters:  11/07/13 5' (1.524 m)    Weight: Wt Readings from Last 1 Encounters:  11/13/13 148 lb 1.6 oz (67.178 kg)    Ideal Body Weight: 100#  % Ideal Body Weight: 148%  Wt Readings from Last 10 Encounters:  11/13/13 148 lb 1.6 oz (67.178 kg)  10/30/13 145 lb 11.2 oz (66.089 kg)  10/16/13 146 lb (66.225 kg)  07/27/13 145 lb 1.6 oz (65.817 kg)  05/05/13 150 lb (68.04 kg)  03/20/13 146 lb (66.225 kg)  02/13/13 143 lb 9.6 oz (65.137 kg)  01/19/13 144 lb (65.318 kg)  12/23/12 148 lb 3.2 oz (67.223 kg)  12/14/12 152 lb 12 oz (69.287 kg)    Usual Body Weight: 145-150#  % Usual Body Weight: 100%  BMI:  Body mass index is 28.92 kg/(m^2). overweight  Estimated Nutritional Needs: Kcal: 1500-1700  Protein: 80 gr Fluid: 1700 ml daily (normal needs)  Skin: intact  Diet Order: Cardiac  EDUCATION NEEDS: -No education needs identified at this time   Intake/Output Summary (Last 24 hours) at 11/13/13 1548 Last data filed at 11/13/13 1207  Gross per 24 hour  Intake    240 ml  Output   1400 ml  Net  -1160 ml    Last BM: 8/28  Labs:   Recent Labs Lab 11/11/13 0538 11/12/13 0607 11/13/13 0530  NA 140 140 141  K 4.3 4.5 4.8  CL 101 102 102  CO2 26 25 27   BUN 80* 86* 88*  CREATININE 2.09* 2.10* 2.06*  CALCIUM 8.4 8.4 8.8  GLUCOSE 125* 122* 118*  CBG (last 3)  No results found for this basename: GLUCAP,  in the last 72 hours  Scheduled Meds: . antiseptic oral rinse  7 mL Mouth Rinse BID  . aspirin EC  81 mg Oral Daily  . metoprolol tartrate  25 mg Oral BID  . polyethylene glycol  17 g Oral Daily  . sodium chloride  3 mL Intravenous Q12H  . torsemide  10 mg Oral Daily  . verapamil  180 mg Oral QHS  . warfarin  1.5 mg Oral Once  . Warfarin - Pharmacist Dosing Inpatient   Does not apply Q24H    Continuous Infusions: . nitroGLYCERIN Stopped (11/08/13 0100)    Past Medical History   Diagnosis Date  . Sick sinus syndrome     Dual-chamber pacemaker in 11/2006  . Aortic stenosis     Moderate to severe  . Left bundle branch block   . Chronic diastolic heart failure   . Coronary atherosclerosis of native coronary artery     Cardiac catheterization 10/09: 50% left anterior descending; 80% PDA;  . Epistaxis     Mild with negative ENT evaluation  . Dizziness     Chronic  . Osteoarthritis   . Irritable bowel syndrome   . Urinary frequency   . Gastroesophageal reflux disease     Esophageal dilatation for stricture in 09/2011  . Rosacea   . Chronic lymphocytic leukemia     Stage I with anemia H./H. of 9/27 in 11/09 with normal MCV; iron deficiency in bone marrow  . Orthostatic hypotension   . Diverticulosis   . Adenomatous polyp October 2010    (Proximal Small bowel) with high grade dysplasia in polypoid lesion straddling D1/D2  . CKD (chronic kidney disease) stage 3, GFR 30-59 ml/min   . Urethral caruncle   . Iron deficiency anemia secondary to blood loss (chronic) 01/19/2013  . History of rectal bleeding 01/19/2013  . PAF (paroxysmal atrial fibrillation)   . Mitral regurgitation     Moderate to severe    Past Surgical History  Procedure Laterality Date  . Cholecystectomy    . Cervical spine surgery    . Lumbar disc surgery    . Bladder suspension    . Abdominal hysterectomy  1972  . Cataract extraction      Bilateral  . Esophagogastroduodenoscopy  01/09/09    Rourk -noncritical Schatzki's ring, small hiatal hernia, fundal gland polyps, bile-stained gastric mucosa, polypoid lesion straddling D1 and D2 1-2 cm, localized lymphangitic-appearing mucosa at D2 biopsied (adenomatous polyp with high grade glandular dysplasia  . A-v cardiac pacemaker insertion  11/2006    [employer]    Colman Cater MS,RD,CSG,LDN Office: #[phone] Pager: [phone]

## 2013-11-13 NOTE — Progress Notes (Signed)
Subjective: She says she feels okay. He she is still somewhat short of breath. No chest pain.  Objective: Vital signs in last 24 hours: Temp:  [97.3 F (36.3 C)-98.1 F (36.7 C)] 97.3 F (36.3 C) (08/31 0421) Pulse Rate:  [60-69] 62 (08/31 0421) Resp:  [20] 20 (08/31 0421) BP: (96-121)/(38-71) 113/71 mmHg (08/31 0421) SpO2:  [98 %-100 %] 100 % (08/30 2018) Weight:  [67.178 kg (148 lb 1.6 oz)] 67.178 kg (148 lb 1.6 oz) (08/31 0421) Weight change: 1.588 kg (3 lb 8 oz) Last BM Date: 11/10/13  Intake/Output from previous day: 08/30 0701 - 08/31 0700 In: 720 [P.O.:720] Out: 1900 [Urine:1900]  PHYSICAL EXAM General appearance: alert, cooperative and no distress Resp: clear to auscultation bilaterally Cardio: regular rate and rhythm, S1, S2 normal, no murmur, click, rub or gallop GI: Her abdomen is becoming more protuberant Extremities: She does not have pitting edema but her legs are generally edematous  Lab Results:  Results for orders placed during the hospital encounter of 11/06/13 (from the past 48 hour(s))  PROTIME-INR     Status: Abnormal   Collection Time    11/12/13  6:07 AM      Result Value Ref Range   Prothrombin Time 22.7 (*) 11.6 - 15.2 seconds   INR 2.00 (*) 0.00 - 1.53  BASIC METABOLIC PANEL     Status: Abnormal   Collection Time    11/12/13  6:07 AM      Result Value Ref Range   Sodium 140  137 - 147 mEq/L   Potassium 4.5  3.7 - 5.3 mEq/L   Chloride 102  96 - 112 mEq/L   CO2 25  19 - 32 mEq/L   Glucose, Bld 122 (*) 70 - 99 mg/dL   BUN 86 (*) 6 - 23 mg/dL   Creatinine, Ser 2.10 (*) 0.50 - 1.10 mg/dL   Calcium 8.4  8.4 - 10.5 mg/dL   GFR calc non Af Amer 19 (*) >90 mL/min   GFR calc Af Amer 22 (*) >90 mL/min   Comment: (NOTE)     The eGFR has been calculated using the CKD EPI equation.     This calculation has not been validated in all clinical situations.     eGFR's persistently <90 mL/min signify possible Chronic Kidney     Disease.   Anion gap 13  5  - 15  CBC WITH DIFFERENTIAL     Status: Abnormal   Collection Time    11/12/13  6:07 AM      Result Value Ref Range   WBC 8.8  4.0 - 10.5 K/uL   RBC 2.84 (*) 3.87 - 5.11 MIL/uL   Hemoglobin 9.0 (*) 12.0 - 15.0 g/dL   HCT 28.5 (*) 36.0 - 46.0 %   MCV 100.4 (*) 78.0 - 100.0 fL   MCH 31.7  26.0 - 34.0 pg   MCHC 31.6  30.0 - 36.0 g/dL   RDW 14.1  11.5 - 15.5 %   Platelets 182  150 - 400 K/uL   Neutrophils Relative % 59  43 - 77 %   Neutro Abs 5.3  1.7 - 7.7 K/uL   Lymphocytes Relative 30  12 - 46 %   Lymphs Abs 2.6  0.7 - 4.0 K/uL   Monocytes Relative 7  3 - 12 %   Monocytes Absolute 0.6  0.1 - 1.0 K/uL   Eosinophils Relative 3  0 - 5 %   Eosinophils Absolute 0.3  0.0 -  0.7 K/uL   Basophils Relative 1  0 - 1 %   Basophils Absolute 0.1  0.0 - 0.1 K/uL  CBC     Status: Abnormal   Collection Time    11/13/13  5:34 AM      Result Value Ref Range   WBC 9.2  4.0 - 10.5 K/uL   RBC 2.96 (*) 3.87 - 5.11 MIL/uL   Hemoglobin 9.6 (*) 12.0 - 15.0 g/dL   HCT 29.7 (*) 36.0 - 46.0 %   MCV 100.3 (*) 78.0 - 100.0 fL   MCH 32.4  26.0 - 34.0 pg   MCHC 32.3  30.0 - 36.0 g/dL   RDW 14.2  11.5 - 15.5 %   Platelets 197  150 - 400 K/uL  PROTIME-INR     Status: Abnormal   Collection Time    11/13/13  5:34 AM      Result Value Ref Range   Prothrombin Time 24.3 (*) 11.6 - 15.2 seconds   INR 2.18 (*) 0.00 - 1.49    ABGS No results found for this basename: PHART, PCO2, PO2ART, TCO2, HCO3,  in the last 72 hours CULTURES Recent Results (from the past 240 hour(s))  MRSA PCR SCREENING     Status: None   Collection Time    11/07/13  1:10 AM      Result Value Ref Range Status   MRSA by PCR NEGATIVE  NEGATIVE Final   Comment:            The GeneXpert MRSA Assay (FDA     approved for NASAL specimens     only), is one component of a     comprehensive MRSA colonization     surveillance program. It is not     intended to diagnose MRSA     infection nor to guide or     monitor treatment for     MRSA  infections.   Studies/Results: No results found.  Medications:  Prior to Admission:  Prescriptions prior to admission  Medication Sig Dispense Refill  . acetaminophen (TYLENOL) 500 MG tablet Take 1 tablet (500 mg total) by mouth 4 (four) times daily.  30 tablet  1  . losartan (COZAAR) 100 MG tablet Take 100 mg by mouth daily.      . metolazone (ZAROXOLYN) 2.5 MG tablet Take 1 tablet (2.5 mg total) by mouth as needed. Take 1 tablet by mouth as needed for leg swelling/SOB  30 tablet  2  . metoprolol tartrate (LOPRESSOR) 25 MG tablet Take 25 mg by mouth 2 (two) times daily.      . Multiple Vitamins-Minerals (MULTIVITAMIN WITH MINERALS) tablet Take 1 tablet by mouth daily.      . nitroGLYCERIN (NITROSTAT) 0.4 MG SL tablet Place 1 tablet (0.4 mg total) under the tongue every 5 (five) minutes as needed for chest pain.  24 tablet  4  . pravastatin (PRAVACHOL) 40 MG tablet Take 40 mg by mouth at bedtime.      . torsemide (DEMADEX) 20 MG tablet Take 10 mg by mouth daily.      . verapamil (CALAN-SR) 180 MG CR tablet Take 180 mg by mouth at bedtime.      [name] warfarin (COUMADIN) 2.5 MG tablet Take 1.25-2.5 mg by mouth daily. Patient takes 1.82m on Mon.,& Thurs. And 2.551mall other days       Scheduled: . antiseptic oral rinse  7 mL Mouth Rinse BID  . aspirin EC  81 mg Oral Daily  .  metoprolol tartrate  25 mg Oral BID  . polyethylene glycol  17 g Oral Daily  . sodium chloride  3 mL Intravenous Q12H  . torsemide  10 mg Oral Daily  . verapamil  180 mg Oral QHS  . Warfarin - Pharmacist Dosing Inpatient   Does not apply Q24H   Continuous: . nitroGLYCERIN Stopped (11/08/13 0100)   FDV:OUZHQU chloride, acetaminophen, albuterol, HYDROcodone-acetaminophen, ondansetron (ZOFRAN) IV, sodium chloride  Assesment: She was admitted with acute on chronic diastolic heart failure. She had a non-STEMI. She has multiple other medical problems including aortic stenosis for which she is not a surgical candidate. She  has a pacemaker in place. She has cirrhosis of the liver and after I discussed her situation with her gastroenterologist it does appear that she is slowly reaccumulating ascites. This does not need paracentesis at this point. Principal Problem:   Acute on chronic diastolic congestive heart failure Active Problems:   HYPERTENSION   Arteriosclerotic cardiovascular disease (ASCVD)   Chronic anticoagulation   Aortic stenosis   Chronic kidney disease   Gastroesophageal reflux disease   Chronic lymphocytic leukemia   Paroxysmal atrial fibrillation   Pacemaker   Chest pain   CHF (congestive heart failure)   NSTEMI (non-ST elevated myocardial infarction)   Cirrhosis of liver without mention of alcohol    Plan: She was started on her diuretics again yesterday. Her BE met is pending. She can probably go home tomorrow. Her prognosis is poor    LOS: 7 days   [name] L 11/13/2013, 8:43 AM

## 2013-11-13 NOTE — Progress Notes (Signed)
[facility] for Coumadin Indication: atrial fibrillation  Allergies  Allergen Reactions  . Vesicare [Solifenacin] Other (See Comments)    Severe stomach pain  . Detrol [Tolterodine Tartrate] Other (See Comments)    Patient states: "I was told to never take it again, I do not recall the reaction"  . [place] [Trospium Chloride] Nausea Only and Other (See Comments)    Intense lower abd pain  . Sulfa Antibiotics Other (See Comments)    Extreme dizziness  . Sulfonamide Derivatives Other (See Comments)    REACTION: GI distress  . Trimethoprim     Other reaction(s): Dermatitis   Patient Measurements: Height: 5' (152.4 cm) Weight: 148 lb 1.6 oz (67.178 kg) IBW/kg (Calculated) : 45.5  Vital Signs: Temp: 97.3 F (36.3 C) (08/31 0421) Temp src: Oral (08/31 0421) BP: 113/71 mmHg (08/31 0421) Pulse Rate: 62 (08/31 0421)  Labs:  Recent Labs  11/11/13 0538 11/12/13 0607 11/13/13 0530 11/13/13 0534  HGB 9.3* 9.0*  --  9.6*  HCT 29.2* 28.5*  --  29.7*  PLT 186 182  --  197  LABPROT 20.0* 22.7*  --  24.3*  INR 1.70* 2.00*  --  2.18*  CREATININE 2.09* 2.10* 2.06*  --    Estimated Creatinine Clearance: 14 ml/min (by C-G formula based on Cr of 2.06).  Medical History: Past Medical History  Diagnosis Date  . Sick sinus syndrome     Dual-chamber pacemaker in 11/2006  . Aortic stenosis     Moderate to severe  . Left bundle branch block   . Chronic diastolic heart failure   . Coronary atherosclerosis of native coronary artery     Cardiac catheterization 10/09: 50% left anterior descending; 80% PDA;  . Epistaxis     Mild with negative ENT evaluation  . Dizziness     Chronic  . Osteoarthritis   . Irritable bowel syndrome   . Urinary frequency   . Gastroesophageal reflux disease     Esophageal dilatation for stricture in 09/2011  . Rosacea   . Chronic lymphocytic leukemia     Stage I with anemia H./H. of 9/27 in 11/09 with normal MCV; iron  deficiency in bone marrow  . Orthostatic hypotension   . Diverticulosis   . Adenomatous polyp October 2010    (Proximal Small bowel) with high grade dysplasia in polypoid lesion straddling D1/D2  . CKD (chronic kidney disease) stage 3, GFR 30-59 ml/min   . Urethral caruncle   . Iron deficiency anemia secondary to blood loss (chronic) 01/19/2013  . History of rectal bleeding 01/19/2013  . PAF (paroxysmal atrial fibrillation)   . Mitral regurgitation     Moderate to severe   Medications:  Prescriptions prior to admission  Medication Sig Dispense Refill  . acetaminophen (TYLENOL) 500 MG tablet Take 1 tablet (500 mg total) by mouth 4 (four) times daily.  30 tablet  1  . losartan (COZAAR) 100 MG tablet Take 100 mg by mouth daily.      . metolazone (ZAROXOLYN) 2.5 MG tablet Take 1 tablet (2.5 mg total) by mouth as needed. Take 1 tablet by mouth as needed for leg swelling/SOB  30 tablet  2  . metoprolol tartrate (LOPRESSOR) 25 MG tablet Take 25 mg by mouth 2 (two) times daily.      . Multiple Vitamins-Minerals (MULTIVITAMIN WITH MINERALS) tablet Take 1 tablet by mouth daily.      . nitroGLYCERIN (NITROSTAT) 0.4 MG SL tablet Place 1 tablet (0.4 mg  total) under the tongue every 5 (five) minutes as needed for chest pain.  24 tablet  4  . pravastatin (PRAVACHOL) 40 MG tablet Take 40 mg by mouth at bedtime.      . torsemide (DEMADEX) 20 MG tablet Take 10 mg by mouth daily.      . verapamil (CALAN-SR) 180 MG CR tablet Take 180 mg by mouth at bedtime.      [name] warfarin (COUMADIN) 2.5 MG tablet Take 1.25-2.5 mg by mouth daily. Patient takes 1.25mg  on Mon.,& Thurs. And 2.5mg  all other days       Assessment: 89 yoF on chronic Coumadin for Afib.  Home dose listed above.  INR now therapeutic. She was admitted with NSTEMI and Coumadin initially held.  No plans for intervention so Coumadin resumed.  Home dose may need adjustment.   Recent rectal bleeding noted, Hg improved today. GI note reviewed  Goal of  Therapy:  INR 2-3   Plan:  Coumadin 1.5 mg po x1 today Daily INR for now  Hart Robinsons A 11/13/2013,10:46 AM

## 2013-11-13 NOTE — Progress Notes (Signed)
PT Cancellation Note  Patient Details Name: [name] MRN: [id number] DOB: Aug 24, 1915   Cancelled Treatment:    Reason Eval/Treat Not Completed: Patient declined, no reason specified  Attempted PT treatment today to work on strengthening and activity tolerance for improved functional mobility skills however pt declined.  Pt stating "i don't think i'm up for it today", stating her bottom hurt.  Questioned pt regarding breathing, pain, or low activity tolerance and pt reports she generally feels tired.  Educated pt on importance of activity to gain strength and activity tolerance, though pt continued to refuse.  Pt was up in chair when PT entered, and reports being OOB this afternoon.  Will re-attempt PT treatment as able.    Kevyn Wengert 11/13/2013, 5:10 PM

## 2013-11-13 NOTE — Progress Notes (Signed)
  Subjective:  Patient denies chest pain but she continues to complain of exertional dyspnea. She complains of constipation. She hasn't had a bowel movement for 3 days. She denies nausea or vomiting. She says her appetite is good. She does not feel that her abdomen is in a more distended than yesterday.   Objective: BP 103/53  Pulse 61  Temp(Src) 97.7 F (36.5 C) (Oral)  Resp 20  Ht 5' (1.524 m)  Wt 148 lb 1.6 oz (67.178 kg)  BMI 28.92 kg/m2  SpO2 100% Patient is alert and appears to be comfortable in bed. Abdomen is full with normal bowel sounds. On palpation it is soft with easily palpable very firm pulsatile liver. Spleen is nonpalpable.  1+ pitting edema to both legs. Labs/studies Results:   Recent Labs  11/11/13 0538 11/12/13 0607 11/13/13 0534  WBC 9.6 8.8 9.2  HGB 9.3* 9.0* 9.6*  HCT 29.2* 28.5* 29.7*  PLT 186 182 197    BMET   Recent Labs  11/11/13 0538 11/12/13 0607 11/13/13 0530  NA 140 140 141  K 4.3 4.5 4.8  CL 101 102 102  CO2 26 25 27   GLUCOSE 125* 122* 118*  BUN 80* 86* 88*  CREATININE 2.09* 2.10* 2.06*  CALCIUM 8.4 8.4 8.8     PT/INR   Recent Labs  11/12/13 0607 11/13/13 0534  LABPROT 22.7* 24.3*  INR 2.00* 2.18*      Assessment:  #1. Ascites felt to be secondary to CHF and cardiac cirrhosis. S/P LVAP 14 days ago with removal of over 3 L of fluid. Ascites is slowly reaccumulating but not to the point that she needs another tap. #2. Cirrhosis. 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 No further workup planned at this time. #3. Rectal bleeding. No bowel movement or rectal bleeding for 3 days. Hemoglobin is up to 9.6 g. #4. History of duodenal adenoma. Patient presently asymptomatic. #5. Constipation. She has history of IBS with diarrhea and urgency now she is constipated.    Recommendations;  Dulcolax suppository tonight unless she has spontaneous bowel movement.

## 2013-11-13 NOTE — Progress Notes (Signed)
Patient ambulated the hall with walker. Patient O2 at rest was 100% on 2.5L. With ambulation with 2L O2 was 95%. With ambulation on room air, O2 was 89%; SOB noted. 2L O2 replaced and patient walked back to room. At rest after ambulation O2 was 100% on 2L. Will continue to monitor.

## 2013-11-14 DIAGNOSIS — N189 Chronic kidney disease, unspecified: Secondary | ICD-10-CM

## 2013-11-14 DIAGNOSIS — N179 Acute kidney failure, unspecified: Secondary | ICD-10-CM | POA: Diagnosis not present

## 2013-11-14 DIAGNOSIS — R188 Other ascites: Secondary | ICD-10-CM | POA: Diagnosis present

## 2013-11-14 DIAGNOSIS — D649 Anemia, unspecified: Secondary | ICD-10-CM | POA: Diagnosis present

## 2013-11-14 LAB — PROTIME-INR
INR: 2.16 — ABNORMAL HIGH (ref 0.00–1.49)
Prothrombin Time: 24.1 seconds — ABNORMAL HIGH (ref 11.6–15.2)

## 2013-11-14 MED ORDER — ASPIRIN 81 MG PO TBEC: 81.0000 mg | DELAYED_RELEASE_TABLET | Freq: Every day | ORAL | Status: AC

## 2013-11-14 MED ORDER — HYDROCODONE-ACETAMINOPHEN 5-325 MG PO TABS: 1.0000 | ORAL_TABLET | ORAL | Status: DC | PRN

## 2013-11-14 MED ORDER — WARFARIN SODIUM 2 MG PO TABS
2.0000 mg | ORAL_TABLET | Freq: Once | ORAL | Status: AC
  Administered 2013-11-14: 2 mg via ORAL
  Filled 2013-11-14: qty 1

## 2013-11-14 NOTE — Plan of Care (Addendum)
Problem: Food- and Nutrition-Related Knowledge Deficit (NB-1.1) Goal: Nutrition education Formal process to instruct or train a patient/client in a skill or to impart knowledge to help patients/clients voluntarily manage or modify food choices and eating behavior to maintain or improve health.  Outcome: Progressing Delayed entry from 8/31-Low Sodium diet education reviewed.

## 2013-11-14 NOTE — Progress Notes (Signed)
Subjective: She says she feels okay. She did suffer a fall last night but had minimal if any injury. She thinks she's okay to go home.  Objective: Vital signs in last 24 hours: Temp:  [97.3 F (36.3 C)-98.6 F (37 C)] 97.3 F (36.3 C) (09/01 0423) Pulse Rate:  [61-71] 71 (09/01 0423) Resp:  [20] 20 (09/01 0423) BP: (88-123)/(43-53) 116/45 mmHg (09/01 0423) SpO2:  [98 %-100 %] 100 % (09/01 0423) Weight:  [66.725 kg (147 lb 1.6 oz)] 66.725 kg (147 lb 1.6 oz) (09/01 0423) Weight change: -0.453 kg (-1 lb) Last BM Date: 11/10/13  Intake/Output from previous day: 08/31 0701 - 09/01 0700 In: 720 [P.O.:720] Out: 875 [Urine:875]  PHYSICAL EXAM General appearance: alert, cooperative and no distress Resp: clear to auscultation bilaterally Cardio: Her heart is regular without gallop GI: Her abdominal girth has increased somewhat Extremities: She does not have pitting edema but has generalized puffiness of her extremities  Lab Results:  Results for orders placed during the hospital encounter of 11/06/13 (from the past 48 hour(s))  BASIC METABOLIC PANEL     Status: Abnormal   Collection Time    11/13/13  5:30 AM      Result Value Ref Range   Sodium 141  137 - 147 mEq/[name]   Potassium 4.8  3.7 - 5.3 mEq/[name]   Chloride 102  96 - 112 mEq/[name]   CO2 27  19 - 32 mEq/[name]   Glucose, Bld 118 (*) 70 - 99 mg/dL   BUN 88 (*) 6 - 23 mg/dL   Creatinine, Ser 2.06 (*) 0.50 - 1.10 mg/dL   Calcium 8.8  8.4 - 10.5 mg/dL   GFR calc non Af Amer 19 (*) >90 mL/min   GFR calc Af Amer 22 (*) >90 mL/min   Comment: (NOTE)     The eGFR has been calculated using the CKD EPI equation.     This calculation has not been validated in all clinical situations.     eGFR's persistently <90 mL/min signify possible Chronic Kidney     Disease.   Anion gap 12  5 - 15  CBC     Status: Abnormal   Collection Time    11/13/13  5:34 AM      Result Value Ref Range   WBC 9.2  4.0 - 10.5 K/uL   RBC 2.96 (*) 3.87 - 5.11 MIL/uL    Hemoglobin 9.6 (*) 12.0 - 15.0 g/dL   HCT 29.7 (*) 36.0 - 46.0 %   MCV 100.3 (*) 78.0 - 100.0 fL   MCH 32.4  26.0 - 34.0 pg   MCHC 32.3  30.0 - 36.0 g/dL   RDW 14.2  11.5 - 15.5 %   Platelets 197  150 - 400 K/uL  PROTIME-INR     Status: Abnormal   Collection Time    11/13/13  5:34 AM      Result Value Ref Range   Prothrombin Time 24.3 (*) 11.6 - 15.2 seconds   INR 2.18 (*) 0.00 - 1.49  PROTIME-INR     Status: Abnormal   Collection Time    11/14/13  5:28 AM      Result Value Ref Range   Prothrombin Time 24.1 (*) 11.6 - 15.2 seconds   INR 2.16 (*) 0.00 - 1.49    ABGS No results found for this basename: PHART, PCO2, PO2ART, TCO2, HCO3,  in the last 72 hours CULTURES Recent Results (from the past 240 hour(s))  MRSA PCR SCREENING  Status: None   Collection Time    11/07/13  1:10 AM      Result Value Ref Range Status   MRSA by PCR NEGATIVE  NEGATIVE Final   Comment:            The GeneXpert MRSA Assay (FDA     approved for NASAL specimens     only), is one component of a     comprehensive MRSA colonization     surveillance program. It is not     intended to diagnose MRSA     infection nor to guide or     monitor treatment for     MRSA infections.   Studies/Results: No results found.  Medications:  Prior to Admission:  Prescriptions prior to admission  Medication Sig Dispense Refill  . acetaminophen (TYLENOL) 500 MG tablet Take 1 tablet (500 mg total) by mouth 4 (four) times daily.  30 tablet  1  . losartan (COZAAR) 100 MG tablet Take 100 mg by mouth daily.      . metolazone (ZAROXOLYN) 2.5 MG tablet Take 1 tablet (2.5 mg total) by mouth as needed. Take 1 tablet by mouth as needed for leg swelling/SOB  30 tablet  2  . metoprolol tartrate (LOPRESSOR) 25 MG tablet Take 25 mg by mouth 2 (two) times daily.      . Multiple Vitamins-Minerals (MULTIVITAMIN WITH MINERALS) tablet Take 1 tablet by mouth daily.      . nitroGLYCERIN (NITROSTAT) 0.4 MG SL tablet Place 1 tablet  (0.4 mg total) under the tongue every 5 (five) minutes as needed for chest pain.  24 tablet  4  . pravastatin (PRAVACHOL) 40 MG tablet Take 40 mg by mouth at bedtime.      . torsemide (DEMADEX) 20 MG tablet Take 10 mg by mouth daily.      . verapamil (CALAN-SR) 180 MG CR tablet Take 180 mg by mouth at bedtime.      [name] warfarin (COUMADIN) 2.5 MG tablet Take 1.25-2.5 mg by mouth daily. Patient takes 1.80m on Mon.,& Thurs. And 2.533mall other days       Scheduled: . antiseptic oral rinse  7 mL Mouth Rinse BID  . aspirin EC  81 mg Oral Daily  . metoprolol tartrate  25 mg Oral BID  . polyethylene glycol  17 g Oral Daily  . sodium chloride  3 mL Intravenous Q12H  . torsemide  10 mg Oral Daily  . verapamil  180 mg Oral QHS  . warfarin  2 mg Oral Once  . Warfarin - Pharmacist Dosing Inpatient   Does not apply Q24H   Continuous: . nitroGLYCERIN Stopped (11/08/13 0100)   PRDUK:GURKYHhloride, acetaminophen, albuterol, HYDROcodone-acetaminophen, ondansetron (ZOFRAN) IV, sodium chloride  Assesment: She was admitted with acute on chronic diastolic CHF and had a non-STEMI. She has improved. She has acute on chronic renal failure. She has cirrhosis of the liver and ascites. She has been treated. Cardiology has seen her and feels that there is little else to offer. I discussed that with her but she does not want palliative care Principal Problem:   Acute on chronic diastolic congestive heart failure Active Problems:   HYPERTENSION   Arteriosclerotic cardiovascular disease (ASCVD)   Chronic anticoagulation   Aortic stenosis   Chronic kidney disease   Gastroesophageal reflux disease   Chronic lymphocytic leukemia   Paroxysmal atrial fibrillation   Pacemaker   Chest pain   CHF (congestive heart failure)   NSTEMI (non-ST elevated myocardial  infarction)   Cirrhosis of liver without mention of alcohol    Plan: Discharge home today    LOS: 8 days   [name] 11/14/2013, 8:36 AM

## 2013-11-14 NOTE — Discharge Summary (Signed)
Physician Discharge Summary  Patient ID: [name] MRN: [id number] DOB/AGE: June 19, 1915 80 y.o. Primary Care Physician:Kolbee Stallman L, MD Admit date: 11/06/2013 Discharge date: 11/14/2013    Discharge Diagnoses:   Principal Problem:   Acute on chronic diastolic congestive heart failure Active Problems:   HYPERTENSION   Arteriosclerotic cardiovascular disease (ASCVD)   Chronic anticoagulation   Aortic stenosis   Chronic kidney disease   Gastroesophageal reflux disease   Chronic lymphocytic leukemia   Paroxysmal atrial fibrillation   Pacemaker   Chest pain   CHF (congestive heart failure)   NSTEMI (non-ST elevated myocardial infarction)   Cirrhosis of liver without mention of alcohol   Ascites   Acute on chronic renal failure   Anemia, unspecified     Medication List    STOP taking these medications       losartan 100 MG tablet  Commonly known as:  COZAAR     metolazone 2.5 MG tablet  Commonly known as:  ZAROXOLYN      TAKE these medications       acetaminophen 500 MG tablet  Commonly known as:  TYLENOL  Take 1 tablet (500 mg total) by mouth 4 (four) times daily.     aspirin 81 MG EC tablet  Take 1 tablet (81 mg total) by mouth daily.     HYDROcodone-acetaminophen 5-325 MG per tablet  Commonly known as:  NORCO/VICODIN  Take 1 tablet by mouth every 4 (four) hours as needed for moderate pain.     metoprolol tartrate 25 MG tablet  Commonly known as:  LOPRESSOR  Take 25 mg by mouth 2 (two) times daily.     multivitamin with minerals tablet  Take 1 tablet by mouth daily.     nitroGLYCERIN 0.4 MG SL tablet  Commonly known as:  NITROSTAT  Place 1 tablet (0.4 mg total) under the tongue every 5 (five) minutes as needed for chest pain.     pravastatin 40 MG tablet  Commonly known as:  PRAVACHOL  Take 40 mg by mouth at bedtime.     torsemide 20 MG tablet  Commonly known as:  DEMADEX  Take 10 mg by mouth daily.     verapamil 180 MG CR tablet  Commonly  known as:  CALAN-SR  Take 180 mg by mouth at bedtime.     warfarin 2.5 MG tablet  Commonly known as:  COUMADIN  Take 1.25-2.5 mg by mouth daily. Patient takes 1.25mg  on Mon.,& Thurs. And 2.5mg  all other days        Discharged Condition: Improved    Consults: Cardiology/gastroenterology  Significant Diagnostic Studies: US Abdomen Complete  11/07/2013   CLINICAL DATA:  Evaluate for ascites. Short of breath. History of a cholecystectomy.  EXAM: ULTRASOUND ABDOMEN COMPLETE  COMPARISON:  Ultrasound, 10/30/2013.  CT, 11/15/2012  FINDINGS: Gallbladder:  Surgically removed  Common bile duct:  Diameter: 6.9 mm  Liver:  Borderline enlarged. Coarsened echotexture. Subtle surface nodularity. No liver mass or focal lesion. Hepatopetal flow documented in the portal vein.  IVC:  No abnormality visualized.  Pancreas:  Limited evaluation.  Portions seen are unremarkable.  Spleen:  Size and appearance within normal limits.  Right Kidney:  Length: 9.8 cm. Diffuse renal parenchymal thinning. Mild increased parenchymal echogenicity. No mass, stone or hydronephrosis.  Left Kidney:  Length: 10.3 cm. Diffuse renal parenchymal thinning. Mild increased parenchymal echogenicity. No mass, stone or hydronephrosis.  Abdominal aorta:  No aneurysm visualized.  Other findings:  Small amount of ascites lies over the  dome of the liver.  IMPRESSION: 1. No acute findings. 2. Small amount of ascites. 3. Appearance of the liver suggests cirrhosis. 4. Findings consistent with medical renal disease with renal cortical thinning and increased renal parenchymal echogenicity.   Electronically Signed   By: Lajean Manes M.D.   On: 11/07/2013 15:48   US Pelvis Limited  10/30/2013   CLINICAL DATA:  Leg edema.  EXAM: US PELVIS LIMITED  TECHNIQUE: Ultrasound examination of the pelvic soft tissues was performed in the area of clinical concern.  COMPARISON:  CT 11/15/2012.  FINDINGS: 2.0 x 0.5 x 1.1 cm and adjacent 2.4 x 0.7 x 1.4 cm right  inguinal lymph nodes are present. These are fat containing and most likely benign.  2.8 x 0.6 x 0.7 cm and adjacent 3.4 x 0.6 x 0.6 cm left inguinal lymph nodes are present. These also contain fat and are most likely benign.  IMPRESSION: Multiple bilateral inguinal lymph nodes. Lymph nodes contain fat and are most likely benign.   Electronically Signed   By: Marcello Moores  Register   On: 10/30/2013 15:22   US Abdomen Limited  10/30/2013   CLINICAL DATA:  Abdominal distention.  EXAM: LIMITED ABDOMEN ULTRASOUND FOR ASCITES  US ABDOMEN LIMITED  TECHNIQUE: Limited ultrasound survey for ascites was performed in all four abdominal quadrants.  COMPARISON:  CT abdomen and pelvis 11/15/2012.  FINDINGS: Moderate ascites is present. Splenic volume is within normal limits at 317 cubic cm with a length of 10.4 cm similar to prior CT.  IMPRESSION: Moderate ascites.  This appears be increased from prior CT.   Electronically Signed   By: Rolla Flatten M.D.   On: 10/30/2013 15:18   US Paracentesis  10/30/2013   CLINICAL DATA:  Abdominal distension, ascites, chronic anti coagulation, hypertension, CHF  EXAM: ULTRASOUND GUIDED diagnostic and therapeutic PARACENTESIS  COMPARISON:  None  PROCEDURE: Procedure, benefits, and risks of procedure were discussed with patient.  Written informed consent for procedure was obtained.  Time out protocol followed.  Adequate collection of ascites localized in RIGHT mid to lower abdomen.  Skin prepped and draped in usual sterile fashion.  Skin and soft tissues anesthetized with 9 mL of 1% lidocaine.  5 [place] Yueh catheter placed into peritoneal cavity.  3300 mL of amber color fluid aspirated by vacuum bottle suction.  Procedure tolerated well by patient without immediate complication.  Due to patient being on chronic anti coagulation, an expected small amount of bleeding at the puncture site was encountered following removal of the catheter.  This was stopped with manual compression and a pressure  dressing was placed.  FINDINGS: A total of approximately 3300 mL of ascitic fluid was removed.  A fluid sample of 180 mL was sent for laboratory analysis.  IMPRESSION: Successful ultrasound guided paracentesis yielding 3300 mL of ascites.   Electronically Signed   By: Lavonia Dana M.D.   On: 10/30/2013 17:10   Dg Chest Port 1 View  11/06/2013   CLINICAL DATA:  Chest pain and shortness breath, history of CHF.  EXAM: PORTABLE CHEST - 1 VIEW  COMPARISON:  CT of the chest Jul 15, 2012 and chest radiograph September 02, 2012  FINDINGS: The cardiac silhouette appears moderate to severely enlarged, which may be accentuated by AP technique. Diffuse interstitial prominence, small to moderate bilateral pleural effusions and bibasilar patchy airspace opacities. Patient's known pulmonary nodules difficult to appreciate on radiography. No pneumothorax.  Dual lead left cardiac pacemaker in situ. Patient is osteopenic. Soft tissue planes  are nonsuspicious.  IMPRESSION: Cardiomegaly, interstitial and bibasilar airspace opacities suggest pulmonary edema with small to moderate pleural effusions.   Electronically Signed   By: Elon Alas   On: 11/06/2013 23:21    Lab Results: Basic Metabolic Panel:  Recent Labs  11/12/13 0607 11/13/13 0530  NA 140 141  K 4.5 4.8  CL 102 102  CO2 25 27  GLUCOSE 122* 118*  BUN 86* 88*  CREATININE 2.10* 2.06*  CALCIUM 8.4 8.8   Liver Function Tests: No results found for this basename: AST, ALT, ALKPHOS, BILITOT, PROT, ALBUMIN,  in the last 72 hours   CBC:  Recent Labs  11/12/13 0607 11/13/13 0534  WBC 8.8 9.2  NEUTROABS 5.3  --   HGB 9.0* 9.6*  HCT 28.5* 29.7*  MCV 100.4* 100.3*  PLT 182 197    Recent Results (from the past 240 hour(s))  MRSA PCR SCREENING     Status: None   Collection Time    11/07/13  1:10 AM      Result Value Ref Range Status   MRSA by PCR NEGATIVE  NEGATIVE Final   Comment:            The GeneXpert MRSA Assay (FDA     approved for NASAL  specimens     only), is one component of a     comprehensive MRSA colonization     surveillance program. It is not     intended to diagnose MRSA     infection nor to guide or     monitor treatment for     MRSA infections.     Hospital Course: This is a 80 year old who was leaving a restaurant after eating her evening meal when she developed severe substernal chest discomfort and shortness of breath. The chest discomfort radiated into her back. She came to the emergency department where she was noted to have acute congestive heart failure. She also had non-STEMI. She has recently been diagnosed with cirrhosis of the liver and ascites and had paracentesis earlier in the week. She was treated with nitroglycerin aspirin diuresis but developed worsening problems with acute on chronic renal failure. She had initially requested full CODE STATUS but after discussion she agreed to DO NOT RESUSCITATE. It has been recommended that she have palliative care but she does not want to do that. She had significant problems with pain and because of efforts to reduce the amount of Tylenol that she is taking she will now be on hydrocodone for pain. By the time of discharge she was improved but her prognosis is overall very poor  Discharge Exam: Blood pressure 116/45, pulse 71, temperature 97.3 F (36.3 C), temperature source Oral, resp. rate 20, height 5' (1.524 m), weight 66.725 kg (147 lb 1.6 oz), SpO2 100.00%. She is awake and alert. Chest is relatively clear. Her heart is regular. Her abdomen is soft and mildly protuberant  Disposition: Home with home health services      Discharge Instructions   Discharge patient    Complete by:  As directed      Face-to-face encounter (required for Medicare/Medicaid patients)    Complete by:  As directed   I Ryelee Albee L certify that this patient is under my care and that I, or a nurse practitioner or physician's assistant working with me, had a face-to-face encounter  that meets the physician face-to-face encounter requirements with this patient on 11/14/2013. The encounter with the patient was in whole, or in part for the following  medical condition(s) which is the primary reason for home health care (List medical condition): Non-STEMI/congestive heart failure/acute on chronic renal failure  The encounter with the patient was in whole, or in part, for the following medical condition, which is the primary reason for home health care:  Non-STEMI/congestive heart failure/acute on chronic renal failure  I certify that, based on my findings, the following services are medically necessary home health services:   Nursing Physical therapy    My clinical findings support the need for the above services:  Shortness of breath with activity  Further, I certify that my clinical findings support that this patient is homebound due to:  Shortness of Breath with activity  Reason for Medically Necessary Home Health Services:  Skilled Nursing- Change/Decline in Patient Status     Home Health    Complete by:  As directed   To provide the following care/treatments:   PT RN    Please check PT/INR on 92/2015 and call results to [facility]. Further Coumadin orders from the Coumadin clinic. Please check basic metabolic panel and CBC on 11/17/2013. Please check abdominal girth each visit and if it increases notify Dr.Rehman           Follow-up Information   Follow up with [facility].   Contact information:   [place] [address] [phone]       Signed: [name]   11/14/2013, 8:56 AM

## 2013-11-14 NOTE — Progress Notes (Signed)
Patient was sitting in chair, then requested to lay back in the bed. Patient's batteries in tele box were also low. So as NT was handing new batteries, patient loss her balance and fell to the floor. The right side of patient back hit the corner of the bed. When asked if she hit her head she said "a little bit, but it's not hurting, just my back." Vital signs were obtained and WNL. A knot was seen on patient's back as staff got her back in bed. MD notified and stated that if her back was still hurting in the morning obtain a X-ray. Patient has no other complaints. Heat pack applied to back. Will continue to monitor.

## 2013-11-14 NOTE — Progress Notes (Signed)
[place] for Coumadin Indication: atrial fibrillation  Allergies  Allergen Reactions  . Vesicare [Solifenacin] Other (See Comments)    Severe stomach pain  . Detrol [Tolterodine Tartrate] Other (See Comments)    Patient states: "I was told to never take it again, I do not recall the reaction"  . [place] [Trospium Chloride] Nausea Only and Other (See Comments)    Intense lower abd pain  . Sulfa Antibiotics Other (See Comments)    Extreme dizziness  . Sulfonamide Derivatives Other (See Comments)    REACTION: GI distress  . Trimethoprim     Other reaction(s): Dermatitis   Patient Measurements: Height: 5' (152.4 cm) Weight: 147 lb 1.6 oz (66.725 kg) IBW/kg (Calculated) : 45.5  Vital Signs: Temp: 97.3 F (36.3 C) (09/01 0423) Temp src: Oral (09/01 0423) BP: 116/45 mmHg (09/01 0423) Pulse Rate: 71 (09/01 0423)  Labs:  Recent Labs  11/12/13 0607 11/13/13 0530 11/13/13 0534 11/14/13 0528  HGB 9.0*  --  9.6*  --   HCT 28.5*  --  29.7*  --   PLT 182  --  197  --   LABPROT 22.7*  --  24.3* 24.1*  INR 2.00*  --  2.18* 2.16*  CREATININE 2.10* 2.06*  --   --    Estimated Creatinine Clearance: 13.9 ml/min (by C-G formula based on Cr of 2.06).  Medical History: Past Medical History  Diagnosis Date  . Sick sinus syndrome     Dual-chamber pacemaker in 11/2006  . Aortic stenosis     Moderate to severe  . Left bundle branch block   . Chronic diastolic heart failure   . Coronary atherosclerosis of native coronary artery     Cardiac catheterization 10/09: 50% left anterior descending; 80% PDA;  . Epistaxis     Mild with negative ENT evaluation  . Dizziness     Chronic  . Osteoarthritis   . Irritable bowel syndrome   . Urinary frequency   . Gastroesophageal reflux disease     Esophageal dilatation for stricture in 09/2011  . Rosacea   . Chronic lymphocytic leukemia     Stage I with anemia H./H. of 9/27 in 11/09 with normal MCV; iron  deficiency in bone marrow  . Orthostatic hypotension   . Diverticulosis   . Adenomatous polyp October 2010    (Proximal Small bowel) with high grade dysplasia in polypoid lesion straddling D1/D2  . CKD (chronic kidney disease) stage 3, GFR 30-59 ml/min   . Urethral caruncle   . Iron deficiency anemia secondary to blood loss (chronic) 01/19/2013  . History of rectal bleeding 01/19/2013  . PAF (paroxysmal atrial fibrillation)   . Mitral regurgitation     Moderate to severe   Medications:  Prescriptions prior to admission  Medication Sig Dispense Refill  . acetaminophen (TYLENOL) 500 MG tablet Take 1 tablet (500 mg total) by mouth 4 (four) times daily.  30 tablet  1  . losartan (COZAAR) 100 MG tablet Take 100 mg by mouth daily.      . metolazone (ZAROXOLYN) 2.5 MG tablet Take 1 tablet (2.5 mg total) by mouth as needed. Take 1 tablet by mouth as needed for leg swelling/SOB  30 tablet  2  . metoprolol tartrate (LOPRESSOR) 25 MG tablet Take 25 mg by mouth 2 (two) times daily.      . Multiple Vitamins-Minerals (MULTIVITAMIN WITH MINERALS) tablet Take 1 tablet by mouth daily.      . nitroGLYCERIN (NITROSTAT) 0.4  MG SL tablet Place 1 tablet (0.4 mg total) under the tongue every 5 (five) minutes as needed for chest pain.  24 tablet  4  . pravastatin (PRAVACHOL) 40 MG tablet Take 40 mg by mouth at bedtime.      . torsemide (DEMADEX) 20 MG tablet Take 10 mg by mouth daily.      . verapamil (CALAN-SR) 180 MG CR tablet Take 180 mg by mouth at bedtime.      [name] warfarin (COUMADIN) 2.5 MG tablet Take 1.25-2.5 mg by mouth daily. Patient takes 1.25mg  on Mon.,& Thurs. And 2.5mg  all other days       Assessment: 89 yoF on chronic Coumadin for Afib.  Home dose listed above.  INR now therapeutic. She was admitted with NSTEMI and Coumadin initially held.  No plans for intervention so Coumadin resumed.  Home dose may need adjustment.   Recent rectal bleeding noted. GI note reviewed  Goal of Therapy:  INR 2-3    Plan:  Coumadin 2 mg po x1 today Daily INR for now  Hart Robinsons A 11/14/2013,7:34 AM

## 2013-11-15 NOTE — Progress Notes (Signed)
Patient discharged in wheelchair with son at side.  Discharge instructions and prescriptions given to son and patient educated on care.  Patient denies any pain at this time and informed of all care regarding medications. MD aware of patient fall night prior and no further orders received.  Patient verbalizes mild soreness and bruising but no other symptoms. All belongings taken with patient and will follow up with primary care as per discharge orders. Vitals stable and no complaints of pain.

## 2013-11-16 ENCOUNTER — Ambulatory Visit (HOSPITAL_COMMUNITY): Payer: Medicare Other

## 2013-11-16 NOTE — Progress Notes (Signed)
UR chart review completed.  

## 2013-11-21 IMAGING — CR DG CHEST 2V
2 series · 2 of 2 positions shown · non-contrast
Comparison: 04/05/2012

CLINICAL DATA: Shortness of breath, cough.

CHEST - 2 VIEW

[view not recorded (1 of 2)]
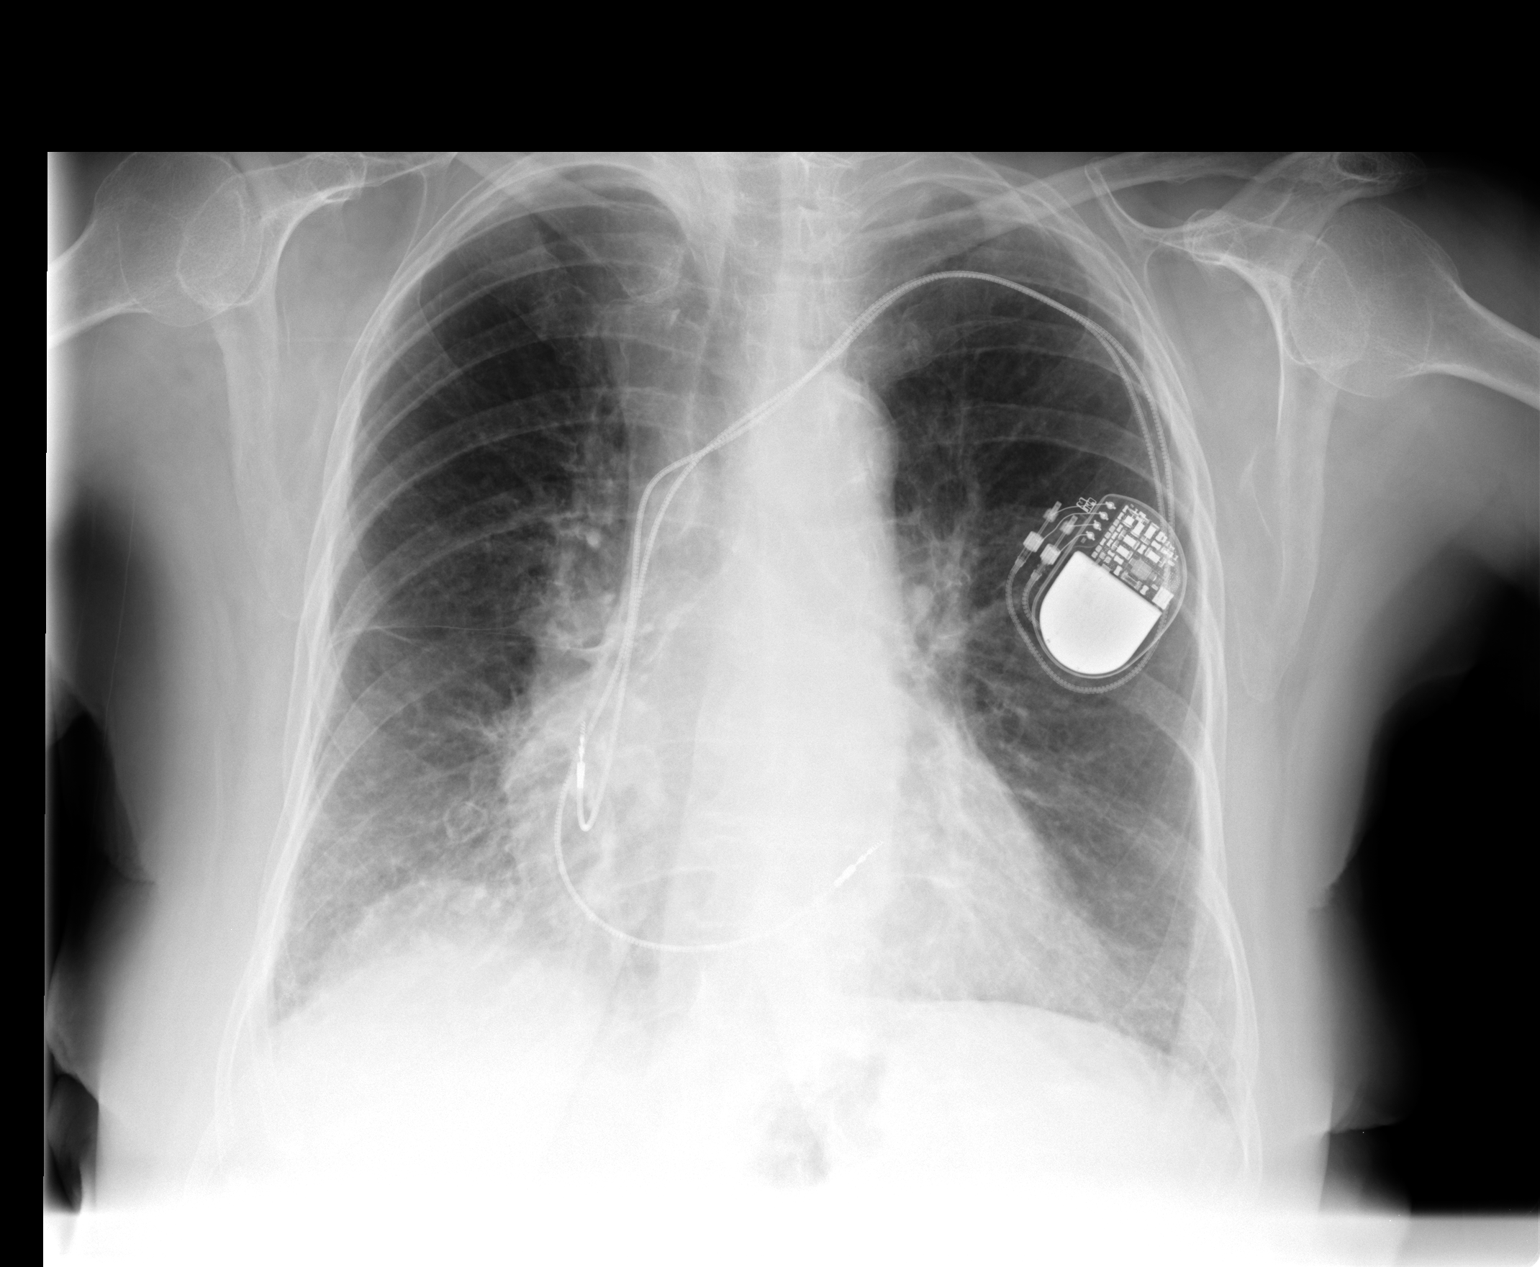

[view not recorded (2 of 2)]
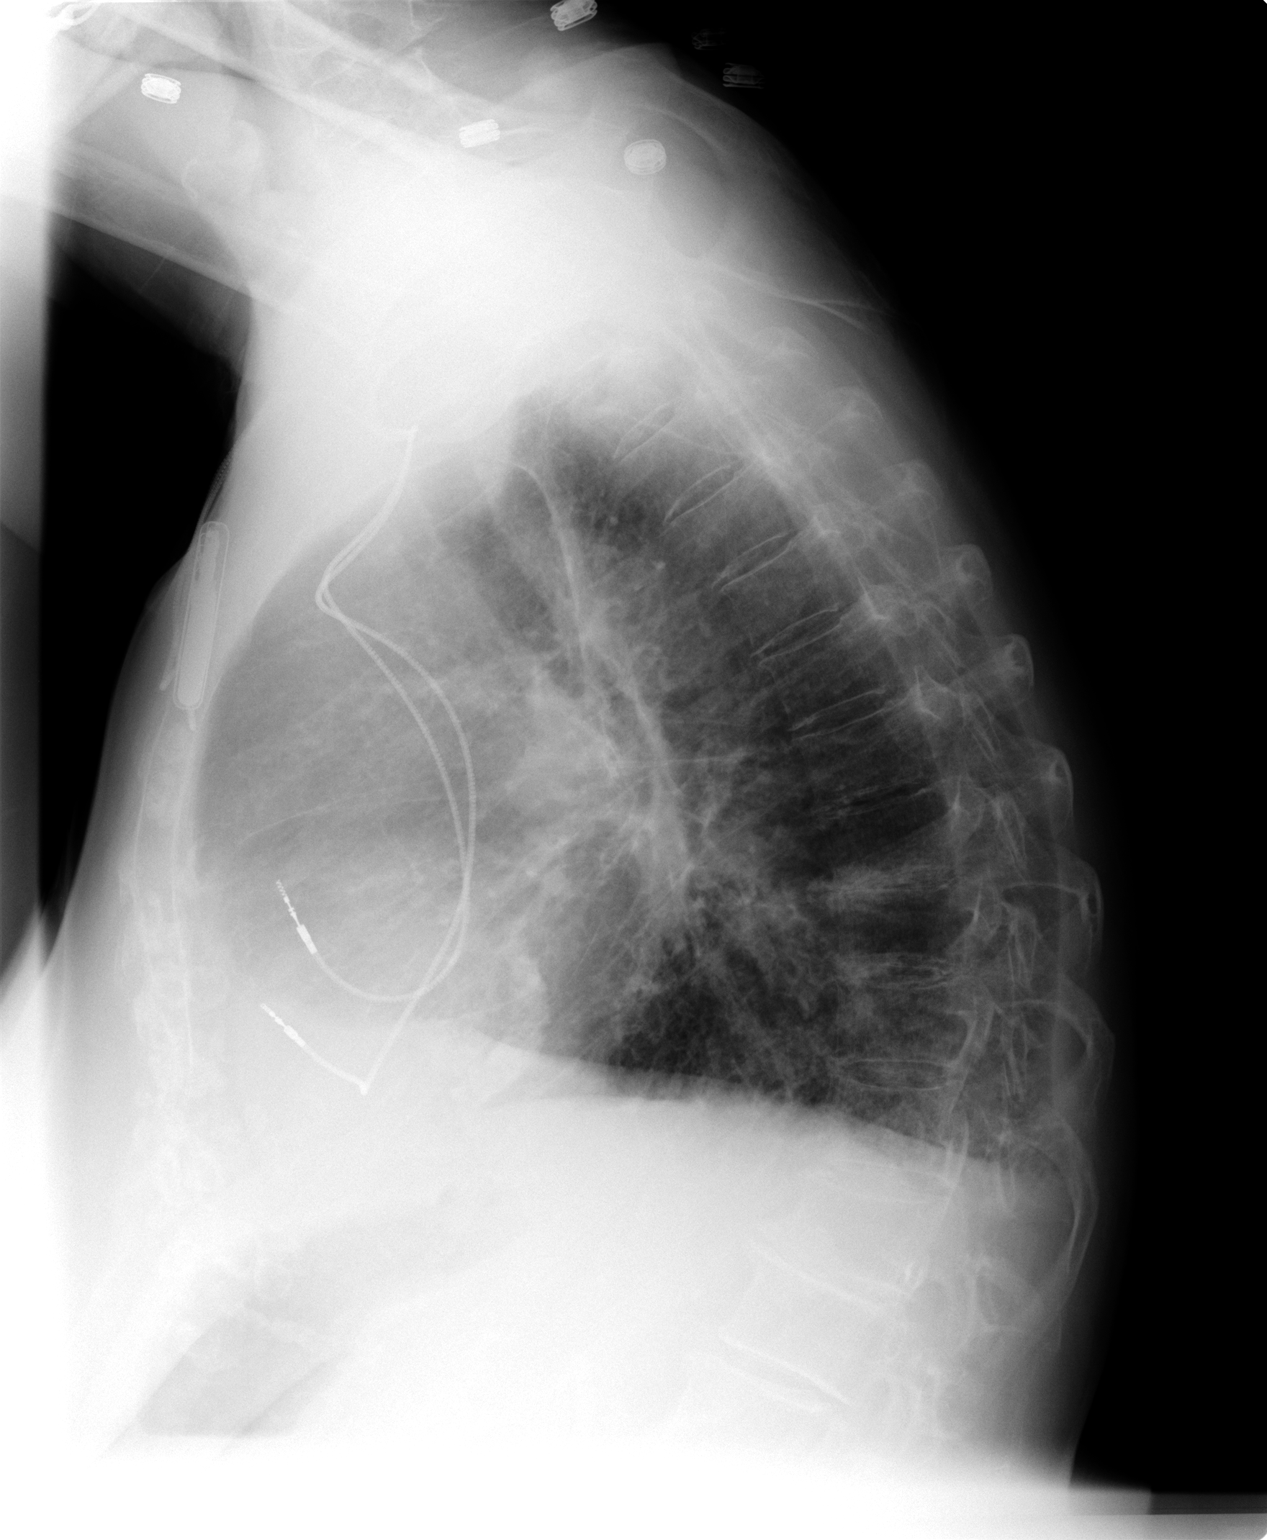

[2 of 2 positions shown; findings below may reference images not displayed]

FINDINGS: Cardiomegaly.  Central vascular congestion and chronic
bibasilar interstitial markings.  Mild right lung base opacity,
decreased.  Hyperinflation with flattened hemidiaphragms.  Left
chest wall battery pack with lead tips projecting over the right
atrium and right ventricle, similar to prior.  Diffuse osteopenia
and multilevel degenerative change.  No acute osseous finding.
IMPRESSION: Decreased right lower lobe opacity; atelectasis versus infiltrate.]

Cardiomegaly with central vascular congestion.  No overt edema.

COPD.

## 2013-11-22 ENCOUNTER — Ambulatory Visit (INDEPENDENT_AMBULATORY_CARE_PROVIDER_SITE_OTHER): Payer: Medicare Other | Admitting: *Deleted

## 2013-11-22 ENCOUNTER — Telehealth: Payer: Self-pay | Admitting: *Deleted

## 2013-11-22 DIAGNOSIS — I495 Sick sinus syndrome: Secondary | ICD-10-CM

## 2013-11-22 DIAGNOSIS — Z5181 Encounter for therapeutic drug level monitoring: Secondary | ICD-10-CM

## 2013-11-22 DIAGNOSIS — I4891 Unspecified atrial fibrillation: Secondary | ICD-10-CM

## 2013-11-22 DIAGNOSIS — I480 Paroxysmal atrial fibrillation: Secondary | ICD-10-CM

## 2013-11-22 LAB — POCT INR: INR: 2.2

## 2013-11-22 NOTE — Telephone Encounter (Signed)
INR 2.2 and she has been having some nose bleeds on and off. She is able to stop them

## 2013-11-22 NOTE — Telephone Encounter (Signed)
See coumadin note. 

## 2013-11-29 ENCOUNTER — Ambulatory Visit (INDEPENDENT_AMBULATORY_CARE_PROVIDER_SITE_OTHER): Payer: Medicare Other | Admitting: *Deleted

## 2013-11-29 DIAGNOSIS — I4891 Unspecified atrial fibrillation: Secondary | ICD-10-CM

## 2013-11-29 DIAGNOSIS — I480 Paroxysmal atrial fibrillation: Secondary | ICD-10-CM

## 2013-11-29 DIAGNOSIS — I495 Sick sinus syndrome: Secondary | ICD-10-CM

## 2013-11-29 DIAGNOSIS — Z5181 Encounter for therapeutic drug level monitoring: Secondary | ICD-10-CM

## 2013-11-29 LAB — POCT INR: INR: 2.5

## 2013-12-08 ENCOUNTER — Other Ambulatory Visit: Payer: Self-pay | Admitting: Cardiology

## 2013-12-11 ENCOUNTER — Other Ambulatory Visit (HOSPITAL_COMMUNITY): Payer: Self-pay | Admitting: Pulmonary Disease

## 2013-12-11 DIAGNOSIS — R188 Other ascites: Secondary | ICD-10-CM

## 2013-12-12 ENCOUNTER — Other Ambulatory Visit (HOSPITAL_COMMUNITY): Payer: Medicare Other

## 2013-12-12 ENCOUNTER — Encounter (HOSPITAL_COMMUNITY): Payer: Self-pay

## 2013-12-12 ENCOUNTER — Ambulatory Visit (HOSPITAL_COMMUNITY)
Admission: RE | Admit: 2013-12-12 | Discharge: 2013-12-12 | Disposition: A | Discharge status: Home / Self Care | Payer: Medicare Other | Source: Ambulatory Visit | Attending: Pulmonary Disease | Admitting: Pulmonary Disease

## 2013-12-12 VITALS — BP 139/46 | HR 82 | Temp 98.1°F | Resp 18

## 2013-12-12 DIAGNOSIS — R188 Other ascites: Secondary | ICD-10-CM | POA: Diagnosis not present

## 2013-12-12 NOTE — Progress Notes (Signed)
Complaints of shortness of breath stated by patient. When assessed patient states that this is been going on for a long time and is no different than normal. Vitals obtained and documented.

## 2013-12-12 NOTE — Progress Notes (Signed)
Paracentesis complete no signs of distress. 2936ml red colored ascites removed.

## 2013-12-13 ENCOUNTER — Telehealth: Payer: Self-pay | Admitting: *Deleted

## 2013-12-13 ENCOUNTER — Ambulatory Visit (INDEPENDENT_AMBULATORY_CARE_PROVIDER_SITE_OTHER): Payer: Medicare Other | Admitting: *Deleted

## 2013-12-13 DIAGNOSIS — Z5181 Encounter for therapeutic drug level monitoring: Secondary | ICD-10-CM

## 2013-12-13 DIAGNOSIS — I4891 Unspecified atrial fibrillation: Secondary | ICD-10-CM

## 2013-12-13 DIAGNOSIS — I495 Sick sinus syndrome: Secondary | ICD-10-CM

## 2013-12-13 DIAGNOSIS — I480 Paroxysmal atrial fibrillation: Secondary | ICD-10-CM

## 2013-12-13 LAB — POCT INR: INR: 3.3

## 2013-12-13 NOTE — Telephone Encounter (Signed)
NR - 3.3 / please call with instructions / tgs

## 2013-12-13 NOTE — Telephone Encounter (Signed)
See coumadin note. 

## 2013-12-16 ENCOUNTER — Emergency Department (HOSPITAL_COMMUNITY)
Admission: EM | Admit: 2013-12-16 | Discharge: 2013-12-16 | Disposition: A | Discharge status: Home / Self Care | Payer: Medicare Other | Attending: Emergency Medicine | Admitting: Emergency Medicine

## 2013-12-16 ENCOUNTER — Emergency Department (HOSPITAL_COMMUNITY): Payer: Medicare Other

## 2013-12-16 ENCOUNTER — Encounter (HOSPITAL_COMMUNITY): Payer: Self-pay | Admitting: Emergency Medicine

## 2013-12-16 DIAGNOSIS — I129 Hypertensive chronic kidney disease with stage 1 through stage 4 chronic kidney disease, or unspecified chronic kidney disease: Secondary | ICD-10-CM | POA: Insufficient documentation

## 2013-12-16 DIAGNOSIS — R079 Chest pain, unspecified: Secondary | ICD-10-CM

## 2013-12-16 DIAGNOSIS — M1990 Unspecified osteoarthritis, unspecified site: Secondary | ICD-10-CM | POA: Insufficient documentation

## 2013-12-16 DIAGNOSIS — Z9889 Other specified postprocedural states: Secondary | ICD-10-CM | POA: Insufficient documentation

## 2013-12-16 DIAGNOSIS — Z79899 Other long term (current) drug therapy: Secondary | ICD-10-CM | POA: Diagnosis not present

## 2013-12-16 DIAGNOSIS — Z87448 Personal history of other diseases of urinary system: Secondary | ICD-10-CM | POA: Diagnosis not present

## 2013-12-16 DIAGNOSIS — Z862 Personal history of diseases of the blood and blood-forming organs and certain disorders involving the immune mechanism: Secondary | ICD-10-CM | POA: Diagnosis not present

## 2013-12-16 DIAGNOSIS — Z856 Personal history of leukemia: Secondary | ICD-10-CM | POA: Insufficient documentation

## 2013-12-16 DIAGNOSIS — Z86010 Personal history of colonic polyps: Secondary | ICD-10-CM | POA: Insufficient documentation

## 2013-12-16 DIAGNOSIS — Z7901 Long term (current) use of anticoagulants: Secondary | ICD-10-CM | POA: Diagnosis not present

## 2013-12-16 DIAGNOSIS — R011 Cardiac murmur, unspecified: Secondary | ICD-10-CM | POA: Insufficient documentation

## 2013-12-16 DIAGNOSIS — L89151 Pressure ulcer of sacral region, stage 1: Secondary | ICD-10-CM

## 2013-12-16 DIAGNOSIS — Z7982 Long term (current) use of aspirin: Secondary | ICD-10-CM | POA: Diagnosis not present

## 2013-12-16 DIAGNOSIS — I480 Paroxysmal atrial fibrillation: Secondary | ICD-10-CM | POA: Insufficient documentation

## 2013-12-16 DIAGNOSIS — F419 Anxiety disorder, unspecified: Secondary | ICD-10-CM | POA: Insufficient documentation

## 2013-12-16 DIAGNOSIS — I5023 Acute on chronic systolic (congestive) heart failure: Secondary | ICD-10-CM

## 2013-12-16 DIAGNOSIS — Z8719 Personal history of other diseases of the digestive system: Secondary | ICD-10-CM | POA: Insufficient documentation

## 2013-12-16 DIAGNOSIS — I350 Nonrheumatic aortic (valve) stenosis: Secondary | ICD-10-CM

## 2013-12-16 DIAGNOSIS — I2510 Atherosclerotic heart disease of native coronary artery without angina pectoris: Secondary | ICD-10-CM | POA: Insufficient documentation

## 2013-12-16 DIAGNOSIS — M5489 Other dorsalgia: Secondary | ICD-10-CM

## 2013-12-16 DIAGNOSIS — N183 Chronic kidney disease, stage 3 (moderate): Secondary | ICD-10-CM | POA: Diagnosis not present

## 2013-12-16 DIAGNOSIS — Z872 Personal history of diseases of the skin and subcutaneous tissue: Secondary | ICD-10-CM | POA: Insufficient documentation

## 2013-12-16 LAB — CBC WITH DIFFERENTIAL/PLATELET
Basophils Absolute: 0 10*3/uL (ref 0.0–0.1)
Basophils Relative: 0 % (ref 0–1)
EOS ABS: 0.3 10*3/uL (ref 0.0–0.7)
Eosinophils Relative: 3 % (ref 0–5)
HEMATOCRIT: 25.9 % — AB (ref 36.0–46.0)
Hemoglobin: 8.1 g/dL — ABNORMAL LOW (ref 12.0–15.0)
Lymphocytes Relative: 21 % (ref 12–46)
Lymphs Abs: 1.8 10*3/uL (ref 0.7–4.0)
MCH: 31.5 pg (ref 26.0–34.0)
MCHC: 31.3 g/dL (ref 30.0–36.0)
MCV: 100.8 fL — AB (ref 78.0–100.0)
MONO ABS: 0.5 10*3/uL (ref 0.1–1.0)
Monocytes Relative: 6 % (ref 3–12)
Neutro Abs: 6 10*3/uL (ref 1.7–7.7)
Neutrophils Relative %: 70 % (ref 43–77)
PLATELETS: 198 10*3/uL (ref 150–400)
RBC: 2.57 MIL/uL — ABNORMAL LOW (ref 3.87–5.11)
RDW: 14.7 % (ref 11.5–15.5)
WBC: 8.6 10*3/uL (ref 4.0–10.5)

## 2013-12-16 LAB — COMPREHENSIVE METABOLIC PANEL
ALT: 9 U/L (ref 0–35)
ANION GAP: 15 (ref 5–15)
AST: 17 U/L (ref 0–37)
Albumin: 3.3 g/dL — ABNORMAL LOW (ref 3.5–5.2)
Alkaline Phosphatase: 111 U/L (ref 39–117)
BUN: 114 mg/dL — ABNORMAL HIGH (ref 6–23)
CO2: 29 mEq/L (ref 19–32)
CREATININE: 2.31 mg/dL — AB (ref 0.50–1.10)
Calcium: 9 mg/dL (ref 8.4–10.5)
Chloride: 98 mEq/L (ref 96–112)
GFR calc Af Amer: 19 mL/min — ABNORMAL LOW (ref >90)
GFR, EST NON AFRICAN AMERICAN: 17 mL/min — AB (ref >90)
GLUCOSE: 156 mg/dL — AB (ref 70–99)
Potassium: 4.7 mEq/L (ref 3.7–5.3)
SODIUM: 142 meq/L (ref 137–147)
TOTAL PROTEIN: 6.5 g/dL (ref 6.0–8.3)
Total Bilirubin: 0.5 mg/dL (ref 0.3–1.2)

## 2013-12-16 LAB — I-STAT TROPONIN, ED: TROPONIN I, POC: 0.02 ng/mL (ref 0.00–0.08)

## 2013-12-16 LAB — PRO B NATRIURETIC PEPTIDE: Pro B Natriuretic peptide (BNP): 9112 pg/mL — ABNORMAL HIGH (ref 0–450)

## 2013-12-16 MED ORDER — HYDROCODONE-ACETAMINOPHEN 5-325 MG PO TABS: 1.0000 | ORAL_TABLET | ORAL | Status: DC | PRN

## 2013-12-16 MED ORDER — FENTANYL CITRATE 0.05 MG/ML IJ SOLN
25.0000 ug | Freq: Once | INTRAMUSCULAR | Status: AC
  Administered 2013-12-16: 25 ug via INTRAVENOUS
  Filled 2013-12-16: qty 2

## 2013-12-16 MED ORDER — FUROSEMIDE 10 MG/ML IJ SOLN
40.0000 mg | Freq: Once | INTRAMUSCULAR | Status: AC
  Administered 2013-12-16: 40 mg via INTRAVENOUS
  Filled 2013-12-16: qty 4

## 2013-12-16 NOTE — Consult Note (Signed)
History and Physical  [name] KGM:[id number] DOB: 1915/08/02 DOA: 12/16/2013  Consulting physician: Dr Stevie Kern, ED physician PCP: [name], MD   Reason for consult: Chest pain  HPI: [name] is a 80 y.o. female  With a history of atrial fibrillation, chronic severe back pain and chest pain, aortic stenosis, chronic diastolic heart failure, sick sinus syndrome. She was seen in emergency department today due to increasing of her chronic chest pain and back pain. She had been placed on hydrocodone by her primary care physician. She has run out of her medication over the past 24 hours. With her increase in chest pain and back pain, she presented to the hospital careful that this represented a heart attack. Her pain is improved from when she came into the hospital. She denies shortness of breath.   Review of Systems:  Patient seen with son and granddaughter. Pt complains of chest pain and back pain, pain in her buttocks, orthopnea  Pt denies any shortness of breath, fevers, chills, nausea, vomiting, cough.  Review of systems are otherwise negative  Past Medical History  Diagnosis Date  . Sick sinus syndrome     Dual-chamber pacemaker in 11/2006  . Aortic stenosis     Moderate to severe  . Left bundle branch block   . Chronic diastolic heart failure   . Coronary atherosclerosis of native coronary artery     Cardiac catheterization 10/09: 50% left anterior descending; 80% PDA;  . Epistaxis     Mild with negative ENT evaluation  . Dizziness     Chronic  . Osteoarthritis   . Irritable bowel syndrome   . Urinary frequency   . Gastroesophageal reflux disease     Esophageal dilatation for stricture in 09/2011  . Rosacea   . Chronic lymphocytic leukemia     Stage I with anemia H./H. of 9/27 in 11/09 with normal MCV; iron deficiency in bone marrow  . Orthostatic hypotension   . Diverticulosis   . Adenomatous polyp October 2010    (Proximal Small bowel) with high grade  dysplasia in polypoid lesion straddling D1/D2  . CKD (chronic kidney disease) stage 3, GFR 30-59 ml/min   . Urethral caruncle   . Iron deficiency anemia secondary to blood loss (chronic) 01/19/2013  . History of rectal bleeding 01/19/2013  . PAF (paroxysmal atrial fibrillation)   . Mitral regurgitation     Moderate to severe   Past Surgical History  Procedure Laterality Date  . Cholecystectomy    . Cervical spine surgery    . Lumbar disc surgery    . Bladder suspension    . Abdominal hysterectomy  1972  . Cataract extraction      Bilateral  . Esophagogastroduodenoscopy  01/09/09    Rourk -noncritical Schatzki's ring, small hiatal hernia, fundal gland polyps, bile-stained gastric mucosa, polypoid lesion straddling D1 and D2 1-2 cm, localized lymphangitic-appearing mucosa at D2 biopsied (adenomatous polyp with high grade glandular dysplasia  . A-v cardiac pacemaker insertion  11/2006    [employer]   Social History:  reports that she has never smoked. She has never used smokeless tobacco. She reports that she does not drink alcohol or use illicit drugs. Patient lives at home & is able to participate in activities of daily living with assistance. She is waiting to get into [facility]  Allergies  Allergen Reactions  . Vesicare [Solifenacin] Other (See Comments)    Severe stomach pain  . Detrol [Tolterodine Tartrate] Other (See Comments)  Patient states: "I was told to never take it again, I do not recall the reaction"  . Sanctura [Trospium Chloride] Nausea Only and Other (See Comments)    Intense lower abd pain  . Sulfa Antibiotics Other (See Comments)    Extreme dizziness  . Sulfonamide Derivatives Other (See Comments)    REACTION: GI distress  . Trimethoprim     Other reaction(s): Dermatitis    Family History  Problem Relation Age of Onset  . Heart failure Father   . Cancer Father   . Diabetes Mother       Prior to Admission medications   Medication Sig Start Date  End Date Taking? Authorizing Provider  aspirin EC 81 MG EC tablet Take 1 tablet (81 mg total) by mouth daily. 11/14/13  Yes [name], MD  HYDROcodone-acetaminophen (NORCO/VICODIN) 5-325 MG per tablet Take 1 tablet by mouth every 4 (four) hours as needed for moderate pain. 11/14/13  Yes [name], MD  losartan (COZAAR) 100 MG tablet Take 100 mg by mouth daily.   Yes Historical Provider, MD  metoprolol tartrate (LOPRESSOR) 25 MG tablet Take 25 mg by mouth 2 (two) times daily.   Yes Historical Provider, MD  Multiple Vitamins-Minerals (PRESERVISION AREDS 2) CAPS Take 1 capsule by mouth 2 (two) times daily.   Yes Historical Provider, MD  nitroGLYCERIN (NITROSTAT) 0.4 MG SL tablet Place 1 tablet (0.4 mg total) under the tongue every 5 (five) minutes as needed for chest pain. 12/14/12  Yes Herminio Commons, MD  pravastatin (PRAVACHOL) 40 MG tablet Take 40 mg by mouth at bedtime.   Yes Historical Provider, MD  torsemide (DEMADEX) 20 MG tablet Take 20 mg by mouth daily. Take 1 tablet daily 12/11/13  Yes Evans Lance, MD  verapamil (CALAN-SR) 180 MG CR tablet Take 180 mg by mouth at bedtime.   Yes Historical Provider, MD  warfarin (COUMADIN) 2.5 MG tablet Take 1.25-2.5 mg by mouth daily. Patient takes 1.25mg  on Mon, Wed and Fri. And 2.5mg  all other days   Yes Historical Provider, MD  HYDROcodone-acetaminophen (NORCO) 5-325 MG per tablet Take 1 tablet by mouth every 4 (four) hours as needed for severe pain. 12/16/13   Babette Relic, MD    Physical Exam: BP 106/42  Pulse 69  Temp(Src) 98.2 F (36.8 C) (Oral)  Resp 18  Ht 5' (1.524 m)  Wt 65.772 kg (145 lb)  BMI 28.32 kg/m2  SpO2 98%  General: Elderly Caucasian female. Awake and alert and oriented x3. No acute cardiopulmonary distress.  Eyes: Pupils equal, round, reactive to light. Extraocular muscles are intact. Sclerae anicteric and noninjected.  ENT: Moist mucosal membranes. No mucosal lesions.   Neck: Neck supple without  lymphadenopathy. No carotid bruits. No masses palpated.  Cardiovascular: Regular rate with normal S1-S2 sounds. No murmurs, rubs, gallops auscultated. No JVD.  Respiratory: Good respiratory effort with no wheezes, rales, rhonchi. Lungs clear to auscultation bilaterally.  Abdomen: Soft, nontender, nondistended. Hyperactive bowel sounds. No masses or hepatosplenomegaly  Skin: Dry, warm to touch. 2+ dorsalis pedis and radial pulses. 2+ edema from the knees distally Musculoskeletal: No calf or leg pain. All major joints not erythematous nontender. Mild tenderness to mid thoracic spine around T7 Psychiatric: Intact judgment and insight.  Neurologic: No focal neurological deficits. Cranial nerves II through XII are grossly intact.           Labs on Admission:  Basic Metabolic Panel:  Recent Labs Lab 12/16/13 1606  NA 142  K 4.7  CL 98  CO2 29  GLUCOSE 156*  BUN 114*  CREATININE 2.31*  CALCIUM 9.0   Liver Function Tests:  Recent Labs Lab 12/16/13 1606  AST 17  ALT 9  ALKPHOS 111  BILITOT 0.5  PROT 6.5  ALBUMIN 3.3*   No results found for this basename: LIPASE, AMYLASE,  in the last 168 hours No results found for this basename: AMMONIA,  in the last 168 hours CBC:  Recent Labs Lab 12/16/13 1606  WBC 8.6  NEUTROABS 6.0  HGB 8.1*  HCT 25.9*  MCV 100.8*  PLT 198   Cardiac Enzymes: No results found for this basename: CKTOTAL, CKMB, CKMBINDEX, TROPONINI,  in the last 168 hours  BNP (last 3 results)  Recent Labs  11/06/13 2247 12/16/13 1606  PROBNP 4148.0* 9112.0*   CBG: No results found for this basename: GLUCAP,  in the last 168 hours  Radiological Exams on Admission: Dg Chest 2 View  12/16/2013   CLINICAL DATA:  Mid back pain that radiates to central chest was shortness of breath, worse with activity and deep inspiration, history of cardiac pacemaker for sick sinus syndrome and history of aortic stenosis and left bundle branch block, chronic diastolic heart  failure, coronary atherosclerosis, chronic lymphocytic leukemia, chronic kidney disease  EXAM: CHEST  2 VIEW  COMPARISON:  11/06/2013  FINDINGS: Moderate to severe cardiac enlargement similar to prior study. Vascular pattern shows no significant congestion. Cardiac pacemaker in unchanged position. Very small bilateral pleural effusions. Mild right lower lobe atelectatic change.  Mitral anulus calcifications.  IMPRESSION: Cardiac enlargement similar to prior studies with small bilateral pleural effusions and right lower lobe opacity most consistent with mild atelectasis at the right lung base. No evidence of pulmonary edema currently.   Electronically Signed   By: Skipper Cliche M.D.   On: 12/16/2013 17:32    EKG: Independently reviewed. Accelerated junctional rhythm with left bundle branch block.  Impression/recommendations #1 muscles total back pain #2 sacral decubitus ulcer #3 diastolic heart failure #4 anemia #5 aortic stenosis  I believe that the patient is having mild exacerbation of her current symptoms regards to her heart failure, anemia, aortic stenosis. I believe that she is stable for discharge to home and does not need to be admitted.  In regards to her musculoskeletal back pain, the pain is relatively mild for a compression fracture. In addition, I do not see any acute compression of vertebral bodies on her chest x-ray. The patient pain is lessened. Dr. Stevie Kern has expressed willingness to refill her hydrocodone until Monday. I recommend that she continue these and follow up with her primary care physician sometime next week.  Her sacral decubitus ulcer needs to be treated. She is currently putting different ointments as instructed by her primary doctor. If these do not resolve her symptoms, she should followup with her primary doctor and perhaps see a wound care specialist.   Thank you for allowing me to proceed in the care of this patient  Loma Boston, DO [facility] Pager [phone]  **Disclaimer: This note may have been dictated with voice recognition software. Similar sounding words can inadvertently be transcribed and this note may contain transcription errors which may not have been corrected upon publication of note.**

## 2013-12-16 NOTE — ED Notes (Signed)
Bednar at bedside, pts family there as well.

## 2013-12-16 NOTE — ED Notes (Signed)
Pt ambulated to restroom,  two person assist (myself and pts granddaughter), normally pt uses a walker at home. Urine sample collected. VS after ambulation HR 86 90% SPO2 on RA, RR26

## 2013-12-16 NOTE — ED Notes (Signed)
Sitting up in chair eating full meal. Taking fluids well. States the hospitalist has already seen her and family states she is going to be discharged. Family will f/u with dr Luan Pulling on monday

## 2013-12-16 NOTE — ED Provider Notes (Signed)
CSN: [id number]     Arrival date & time 12/16/13  54 History   First MD Initiated Contact with Patient 12/16/13 1553     Chief Complaint  Patient presents with  . Chest Pain     (Consider location/radiation/quality/duration/timing/severity/associated sxs/prior Treatment) HPI 80 year old female has chronic severe back pain and chronic severe chest pain 24 hours a day for several weeks if not longer which was not controlled on Tylenol also has been improved on hydrocodone but she ran out of her hydrocodone within the last day or so. Over the last 2 days she has gradually worsening pain in her baseline severe pain her pain is worse with movement and position changes better she stays still. She is baseline shortness of breath with minimal exertion which is unchanged since her last hospitalization for exacerbation of heart failure with NSTEMI. She also has aortic stenosis and takes Coumadin for atrial fibrillation. She also has a pacemaker for sick sinus syndrome. She lives at home alone and is afraid to live at home alone. She does have family who visits as well as home health care. She is looking into staying at the [facility]. Apparently she did not qualify for nursing home during her last hospitalization. Her most recent echocardiogram within the last 2 months revealed normal ejection fraction 60-65%. She also has ascites with cirrhosis. She has no new focal weakness or numbness. She is no fever cough or change in mental status. There is no new trauma. She has no recent syncope. Past Medical History  Diagnosis Date  . Sick sinus syndrome     Dual-chamber pacemaker in 11/2006  . Aortic stenosis     Moderate to severe  . Left bundle branch block   . Chronic diastolic heart failure   . Coronary atherosclerosis of native coronary artery     Cardiac catheterization 10/09: 50% left anterior descending; 80% PDA;  . Epistaxis     Mild with negative ENT evaluation  . Dizziness     Chronic  .  Osteoarthritis   . Irritable bowel syndrome   . Urinary frequency   . Gastroesophageal reflux disease     Esophageal dilatation for stricture in 09/2011  . Rosacea   . Chronic lymphocytic leukemia     Stage I with anemia H./H. of 9/27 in 11/09 with normal MCV; iron deficiency in bone marrow  . Orthostatic hypotension   . Diverticulosis   . Adenomatous polyp October 2010    (Proximal Small bowel) with high grade dysplasia in polypoid lesion straddling D1/D2  . CKD (chronic kidney disease) stage 3, GFR 30-59 ml/min   . Urethral caruncle   . Iron deficiency anemia secondary to blood loss (chronic) 01/19/2013  . History of rectal bleeding 01/19/2013  . PAF (paroxysmal atrial fibrillation)   . Mitral regurgitation     Moderate to severe   Past Surgical History  Procedure Laterality Date  . Cholecystectomy    . Cervical spine surgery    . Lumbar disc surgery    . Bladder suspension    . Abdominal hysterectomy  1972  . Cataract extraction      Bilateral  . Esophagogastroduodenoscopy  01/09/09    Rourk -noncritical Schatzki's ring, small hiatal hernia, fundal gland polyps, bile-stained gastric mucosa, polypoid lesion straddling D1 and D2 1-2 cm, localized lymphangitic-appearing mucosa at D2 biopsied (adenomatous polyp with high grade glandular dysplasia  . A-v cardiac pacemaker insertion  11/2006    [employer]   Family History  Problem Relation Age  of Onset  . Heart failure Father   . Cancer Father   . Diabetes Mother    History  Substance Use Topics  . Smoking status: Never Smoker   . Smokeless tobacco: Never Used  . Alcohol Use: No   OB History   Grav Para Term Preterm Abortions TAB SAB Ect Mult Living   2 2 2       2      Review of Systems 10 Systems reviewed and are negative for acute change except as noted in the HPI.   Allergies  Vesicare; Detrol; Sanctura; Sulfa antibiotics; Sulfonamide derivatives; and Trimethoprim  Home Medications   Prior to Admission  medications   Medication Sig Start Date End Date Taking? Authorizing Provider  aspirin EC 81 MG EC tablet Take 1 tablet (81 mg total) by mouth daily. 11/14/13  Yes [name], MD  HYDROcodone-acetaminophen (NORCO/VICODIN) 5-325 MG per tablet Take 1 tablet by mouth every 4 (four) hours as needed for moderate pain. 11/14/13  Yes [name], MD  losartan (COZAAR) 100 MG tablet Take 100 mg by mouth daily.   Yes Historical Provider, MD  metoprolol tartrate (LOPRESSOR) 25 MG tablet Take 25 mg by mouth 2 (two) times daily.   Yes Historical Provider, MD  Multiple Vitamins-Minerals (PRESERVISION AREDS 2) CAPS Take 1 capsule by mouth 2 (two) times daily.   Yes Historical Provider, MD  nitroGLYCERIN (NITROSTAT) 0.4 MG SL tablet Place 1 tablet (0.4 mg total) under the tongue every 5 (five) minutes as needed for chest pain. 12/14/12  Yes Herminio Commons, MD  pravastatin (PRAVACHOL) 40 MG tablet Take 40 mg by mouth at bedtime.   Yes Historical Provider, MD  torsemide (DEMADEX) 20 MG tablet Take 20 mg by mouth daily. Take 1 tablet daily 12/11/13  Yes Evans Lance, MD  verapamil (CALAN-SR) 180 MG CR tablet Take 180 mg by mouth at bedtime.   Yes Historical Provider, MD  warfarin (COUMADIN) 2.5 MG tablet Take 1.25-2.5 mg by mouth daily. Patient takes 1.25mg  on Mon, Wed and Fri. And 2.5mg  all other days   Yes Historical Provider, MD  HYDROcodone-acetaminophen (NORCO) 5-325 MG per tablet Take 1 tablet by mouth every 4 (four) hours as needed for severe pain. 12/16/13   Babette Relic, MD   BP 106/42  Pulse 69  Temp(Src) 98.2 F (36.8 C) (Oral)  Resp 18  Ht 5' (1.524 m)  Wt 145 lb (65.772 kg)  BMI 28.32 kg/m2  SpO2 98% Physical Exam  Nursing note and vitals reviewed. Constitutional:  Awake, alert, nontoxic appearance.  HENT:  Head: Atraumatic.  Eyes: Right eye exhibits no discharge. Left eye exhibits no discharge.  Neck: Neck supple.  Cardiovascular: Normal rate and regular rhythm.   Murmur  heard. Pulmonary/Chest: Effort normal. No respiratory distress. She has no wheezes. She has rales. She exhibits no tenderness.  Patient speaks full sentences at rest without shortness of breath at rest with pulse oximetry normal on room air at rest at 96% with bibasilar crackles otherwise lungs clear and unlabored  Abdominal: Soft. There is no tenderness. There is no rebound.  Musculoskeletal: She exhibits edema. She exhibits no tenderness.  Baseline ROM, no obvious new focal weakness. Baseline moderate edema to her lower legs at baseline stasis dermatitis without apparent cellulitis bilateral lower legs.  Neurological: She is alert.  Mental status and motor strength appears baseline for patient and situation.  Skin: No rash noted.  Psychiatric:  Patient appears anxious and tearful  ED Course  Procedures (including critical care time) Patient / Family / Caregiver understand and agree with initial ED impression and plan with expectations set for ED visit. Pt stable in ED with no significant deterioration in condition. Pt wants admit since afraid to be alone at home. Patient / Family / Caregiver informed of clinical course, understand medical decision-making process, and agree with plan. D/w [facility] who saw Pt and agrees no indication for re-admit. Labs Review Labs Reviewed  PRO B NATRIURETIC PEPTIDE - Abnormal; Notable for the following:    Pro B Natriuretic peptide (BNP) 9112.0 (*)    All other components within normal limits  CBC WITH DIFFERENTIAL - Abnormal; Notable for the following:    RBC 2.57 (*)    Hemoglobin 8.1 (*)    HCT 25.9 (*)    MCV 100.8 (*)    All other components within normal limits  COMPREHENSIVE METABOLIC PANEL - Abnormal; Notable for the following:    Glucose, Bld 156 (*)    BUN 114 (*)    Creatinine, Ser 2.31 (*)    Albumin 3.3 (*)    GFR calc non Af Amer 17 (*)    GFR calc Af Amer 19 (*)    All other components within normal limits  I-STAT TROPOININ, ED     Imaging Review No results found.   EKG Interpretation   Date/Time:  Saturday December 16 2013 15:48:28 EDT Ventricular Rate:  75 PR Interval:    QRS Duration: 141 QT Interval:  402 QTC Calculation: 449 R Axis:   125 Text Interpretation:  Accelerated junctional rhythm Left bundle branch  block Compared to previous tracing Atrial fibrillation NO LONGER PRESENT  Confirmed by Stevie Kern  MD, Jenny Reichmann ([id number]) on 12/16/2013 3:55:30 PM      MDM   Final diagnoses:  Other back pain  Decubitus ulcer of sacral region, stage 1    I doubt any other EMC precluding discharge at this time including, but not necessarily limited to the following:MI, acute HF.    Babette Relic, MD 12/18/13 [phone]

## 2013-12-16 NOTE — ED Notes (Signed)
Patient w/recent past MI having exacerbation of mid back pain that radiates to central chest.  Pain and SOB worsens with activity and deep breathing.  Denies n/v, syncope.  [name] has pacemaker.

## 2013-12-16 NOTE — Discharge Instructions (Signed)
Not every illness or injury can be identified during an emergency department visit, thus follow-up with your primary healthcare provider is important. Medical conditions can also worsen, so it is also important to return immediately as directed below, or if you have other serious concerns develop. RETURN IMMEDIATELY IF you develop new shortness of breath, new chest pain, fever, have difficulty moving parts of your body (new weakness, numbness, or incoordination), sudden change in speech, vision, swallowing, or understanding, faint or develop new dizziness, severe headache, become poorly responsive or have an altered mental status compared to baseline for you, new rash, abdominal pain, or bloody stools,  Return sooner also if you develop new problems for which you have not talked to your caregiver but you feel may be emergency medical conditions, or are unable to be cared for safely at home.  ALSO SEEK IMMEDIATE MEDICAL ATTENTION IF: New numbness, tingling, weakness, or problem with the use of your arms or legs.  Severe back pain not improved with medications.  Change in bowel or bladder control.  Increasing pain in any areas of the body (such as chest or abdominal pain).  Shortness of breath, dizziness or fainting.  Nausea (feeling sick to your stomach), vomiting, fever, or sweats.

## 2013-12-18 ENCOUNTER — Ambulatory Visit (INDEPENDENT_AMBULATORY_CARE_PROVIDER_SITE_OTHER): Payer: Medicare Other | Admitting: Internal Medicine

## 2013-12-20 ENCOUNTER — Ambulatory Visit (INDEPENDENT_AMBULATORY_CARE_PROVIDER_SITE_OTHER): Payer: Medicare Other | Admitting: Cardiology

## 2013-12-20 DIAGNOSIS — I480 Paroxysmal atrial fibrillation: Secondary | ICD-10-CM

## 2013-12-20 DIAGNOSIS — Z5181 Encounter for therapeutic drug level monitoring: Secondary | ICD-10-CM

## 2013-12-20 DIAGNOSIS — I495 Sick sinus syndrome: Secondary | ICD-10-CM

## 2013-12-20 LAB — POCT INR: INR: 2.4

## 2013-12-21 ENCOUNTER — Emergency Department (HOSPITAL_COMMUNITY)
Admission: EM | Admit: 2013-12-21 | Discharge: 2013-12-21 | Disposition: A | Discharge status: Home / Self Care | Payer: Medicare Other | Source: Home / Self Care | Attending: Emergency Medicine | Admitting: Emergency Medicine

## 2013-12-21 ENCOUNTER — Emergency Department (HOSPITAL_COMMUNITY): Payer: Medicare Other

## 2013-12-21 ENCOUNTER — Encounter (HOSPITAL_COMMUNITY): Payer: Self-pay | Admitting: Emergency Medicine

## 2013-12-21 DIAGNOSIS — Z7901 Long term (current) use of anticoagulants: Secondary | ICD-10-CM

## 2013-12-21 DIAGNOSIS — M1990 Unspecified osteoarthritis, unspecified site: Secondary | ICD-10-CM

## 2013-12-21 DIAGNOSIS — N183 Chronic kidney disease, stage 3 (moderate): Secondary | ICD-10-CM | POA: Insufficient documentation

## 2013-12-21 DIAGNOSIS — Z8719 Personal history of other diseases of the digestive system: Secondary | ICD-10-CM | POA: Insufficient documentation

## 2013-12-21 DIAGNOSIS — I5033 Acute on chronic diastolic (congestive) heart failure: Secondary | ICD-10-CM | POA: Diagnosis not present

## 2013-12-21 DIAGNOSIS — Z856 Personal history of leukemia: Secondary | ICD-10-CM | POA: Insufficient documentation

## 2013-12-21 DIAGNOSIS — R627 Adult failure to thrive: Secondary | ICD-10-CM

## 2013-12-21 DIAGNOSIS — Z79899 Other long term (current) drug therapy: Secondary | ICD-10-CM

## 2013-12-21 DIAGNOSIS — Z7982 Long term (current) use of aspirin: Secondary | ICD-10-CM

## 2013-12-21 DIAGNOSIS — R0602 Shortness of breath: Secondary | ICD-10-CM | POA: Diagnosis not present

## 2013-12-21 DIAGNOSIS — I2510 Atherosclerotic heart disease of native coronary artery without angina pectoris: Secondary | ICD-10-CM | POA: Insufficient documentation

## 2013-12-21 DIAGNOSIS — I5032 Chronic diastolic (congestive) heart failure: Secondary | ICD-10-CM

## 2013-12-21 LAB — CBC WITH DIFFERENTIAL/PLATELET
BASOS ABS: 0 10*3/uL (ref 0.0–0.1)
Basophils Relative: 0 % (ref 0–1)
Eosinophils Absolute: 0.2 10*3/uL (ref 0.0–0.7)
Eosinophils Relative: 2 % (ref 0–5)
HCT: 25.7 % — ABNORMAL LOW (ref 36.0–46.0)
Hemoglobin: 8.3 g/dL — ABNORMAL LOW (ref 12.0–15.0)
LYMPHS PCT: 17 % (ref 12–46)
Lymphs Abs: 1.6 10*3/uL (ref 0.7–4.0)
MCH: 32.5 pg (ref 26.0–34.0)
MCHC: 32.3 g/dL (ref 30.0–36.0)
MCV: 100.8 fL — AB (ref 78.0–100.0)
Monocytes Absolute: 0.7 10*3/uL (ref 0.1–1.0)
Monocytes Relative: 7 % (ref 3–12)
Neutro Abs: 7.2 10*3/uL (ref 1.7–7.7)
Neutrophils Relative %: 74 % (ref 43–77)
PLATELETS: 195 10*3/uL (ref 150–400)
RBC: 2.55 MIL/uL — ABNORMAL LOW (ref 3.87–5.11)
RDW: 14.7 % (ref 11.5–15.5)
WBC: 9.7 10*3/uL (ref 4.0–10.5)

## 2013-12-21 LAB — COMPREHENSIVE METABOLIC PANEL
ALK PHOS: 122 U/L — AB (ref 39–117)
ALT: 10 U/L (ref 0–35)
AST: 16 U/L (ref 0–37)
Albumin: 3.4 g/dL — ABNORMAL LOW (ref 3.5–5.2)
Anion gap: 14 (ref 5–15)
BUN: 118 mg/dL — ABNORMAL HIGH (ref 6–23)
CALCIUM: 9.2 mg/dL (ref 8.4–10.5)
CO2: 29 meq/L (ref 19–32)
Chloride: 100 mEq/L (ref 96–112)
Creatinine, Ser: 2.28 mg/dL — ABNORMAL HIGH (ref 0.50–1.10)
GFR calc non Af Amer: 17 mL/min — ABNORMAL LOW (ref >90)
GFR, EST AFRICAN AMERICAN: 20 mL/min — AB (ref >90)
GLUCOSE: 130 mg/dL — AB (ref 70–99)
POTASSIUM: 4.2 meq/L (ref 3.7–5.3)
SODIUM: 143 meq/L (ref 137–147)
Total Bilirubin: 0.4 mg/dL (ref 0.3–1.2)
Total Protein: 6.6 g/dL (ref 6.0–8.3)

## 2013-12-21 LAB — URINALYSIS, ROUTINE W REFLEX MICROSCOPIC
Bilirubin Urine: NEGATIVE
GLUCOSE, UA: NEGATIVE mg/dL
KETONES UR: NEGATIVE mg/dL
NITRITE: NEGATIVE
PH: 5.5 (ref 5.0–8.0)
Protein, ur: NEGATIVE mg/dL
Urobilinogen, UA: 0.2 mg/dL (ref 0.0–1.0)

## 2013-12-21 LAB — PRO B NATRIURETIC PEPTIDE: PRO B NATRI PEPTIDE: 11458 pg/mL — AB (ref 0–450)

## 2013-12-21 LAB — LACTIC ACID, PLASMA: LACTIC ACID, VENOUS: 1 mmol/L (ref 0.5–2.2)

## 2013-12-21 LAB — URINE MICROSCOPIC-ADD ON

## 2013-12-21 LAB — LIPASE, BLOOD: Lipase: 56 U/L (ref 11–59)

## 2013-12-21 LAB — TROPONIN I: Troponin I: <0.3 ng/mL (ref <0.30)

## 2013-12-21 LAB — PROTIME-INR
INR: 2.57 — ABNORMAL HIGH (ref 0.00–1.49)
PROTHROMBIN TIME: 27.6 s — AB (ref 11.6–15.2)

## 2013-12-21 MED ORDER — TORSEMIDE 20 MG PO TABS: 20.0000 mg | ORAL_TABLET | Freq: Two times a day (BID) | ORAL | Status: AC

## 2013-12-21 MED ORDER — FUROSEMIDE 10 MG/ML IJ SOLN
40.0000 mg | Freq: Once | INTRAMUSCULAR | Status: AC
  Administered 2013-12-21: 40 mg via INTRAVENOUS
  Filled 2013-12-21: qty 4

## 2013-12-21 NOTE — ED Notes (Signed)
Patient ambulated with walker.  o2 sat maintained at 92% while walking.

## 2013-12-21 NOTE — ED Notes (Signed)
Edema noted to lower extremities.

## 2013-12-21 NOTE — ED Provider Notes (Signed)
CSN: [id number]     Arrival date & time 12/21/13  0700 History  This chart was scribed for Merryl Hacker, MD by Zola Button, ED Scribe. This patient was seen in room APA04/APA04 and the patient's care was started at 7:16 AM.       Chief Complaint  Patient presents with  . Abdominal Pain     HPI HPI Comments: [name] is a 80 y.o. female with a hx of CHF who presents to the Emergency Department complaining of gradual onset, intermittent, mild abdominal pain that began this past week. Patient was seen here twice recently in the ED. She was not admitted either time, but she thinks she should have been admitted. Patient has had an advanced home care worker stay with her for the past 3 nights. She notes associated nausea that began 2-3 hours ago, cough and leg swelling that is above baseline. Patient denies vomiting and diarrhea. Patient regularly takes furosemide. A social worker has seen her in the ED in the past and she does have placement in assisted living for Monday.  The patient's son, she is to followup with her primary doctor today to have appropriate forms signed and TB test read.   Past Medical History  Diagnosis Date  . Sick sinus syndrome     Dual-chamber pacemaker in 11/2006  . Aortic stenosis     Moderate to severe  . Left bundle branch block   . Chronic diastolic heart failure   . Coronary atherosclerosis of native coronary artery     Cardiac catheterization 10/09: 50% left anterior descending; 80% PDA;  . Epistaxis     Mild with negative ENT evaluation  . Dizziness     Chronic  . Osteoarthritis   . Irritable bowel syndrome   . Urinary frequency   . Gastroesophageal reflux disease     Esophageal dilatation for stricture in 09/2011  . Rosacea   . Chronic lymphocytic leukemia     Stage I with anemia H./H. of 9/27 in 11/09 with normal MCV; iron deficiency in bone marrow  . Orthostatic hypotension   . Diverticulosis   . Adenomatous polyp October 2010    (Proximal  Small bowel) with high grade dysplasia in polypoid lesion straddling D1/D2  . CKD (chronic kidney disease) stage 3, GFR 30-59 ml/min   . Urethral caruncle   . Iron deficiency anemia secondary to blood loss (chronic) 01/19/2013  . History of rectal bleeding 01/19/2013  . PAF (paroxysmal atrial fibrillation)   . Mitral regurgitation     Moderate to severe   Past Surgical History  Procedure Laterality Date  . Cholecystectomy    . Cervical spine surgery    . Lumbar disc surgery    . Bladder suspension    . Abdominal hysterectomy  1972  . Cataract extraction      Bilateral  . Esophagogastroduodenoscopy  01/09/09    Rourk -noncritical Schatzki's ring, small hiatal hernia, fundal gland polyps, bile-stained gastric mucosa, polypoid lesion straddling D1 and D2 1-2 cm, localized lymphangitic-appearing mucosa at D2 biopsied (adenomatous polyp with high grade glandular dysplasia  . A-v cardiac pacemaker insertion  11/2006    [employer]   Family History  Problem Relation Age of Onset  . Heart failure Father   . Cancer Father   . Diabetes Mother    History  Substance Use Topics  . Smoking status: Never Smoker   . Smokeless tobacco: Never Used  . Alcohol Use: No   OB History  Grav Para Term Preterm Abortions TAB SAB Ect Mult Living   2 2 2       2      Review of Systems  Constitutional: Negative for fever.  Respiratory: Negative for cough, chest tightness and shortness of breath.   Cardiovascular: Positive for leg swelling. Negative for chest pain.  Gastrointestinal: Positive for nausea and abdominal pain. Negative for vomiting and diarrhea.  Genitourinary: Negative for dysuria.  Musculoskeletal: Negative for back pain.  Skin: Negative for wound.  Neurological: Negative for headaches.  Psychiatric/Behavioral: Negative for confusion.  All other systems reviewed and are negative.     Allergies  Vesicare; Detrol; Sanctura; Sulfa antibiotics; Sulfonamide derivatives; and  Trimethoprim  Home Medications   Prior to Admission medications   Medication Sig Start Date End Date Taking? Authorizing Provider  aspirin EC 81 MG EC tablet Take 1 tablet (81 mg total) by mouth daily. 11/14/13  Yes [name], MD  HYDROcodone-acetaminophen (NORCO/VICODIN) 5-325 MG per tablet Take 1 tablet by mouth every 4 (four) hours as needed for severe pain.   Yes Historical Provider, MD  losartan (COZAAR) 100 MG tablet Take 100 mg by mouth daily.   Yes Historical Provider, MD  metoprolol tartrate (LOPRESSOR) 25 MG tablet Take 25 mg by mouth 2 (two) times daily.   Yes Historical Provider, MD  Multiple Vitamins-Minerals (PRESERVISION AREDS 2) CAPS Take 1 capsule by mouth 2 (two) times daily.   Yes Historical Provider, MD  pravastatin (PRAVACHOL) 40 MG tablet Take 40 mg by mouth at bedtime.   Yes Historical Provider, MD  verapamil (CALAN-SR) 180 MG CR tablet Take 180 mg by mouth at bedtime.   Yes Historical Provider, MD  warfarin (COUMADIN) 2.5 MG tablet Take 1.25-2.5 mg by mouth daily. Patient takes 1.25mg  on Mon, Wed and Fri. And 2.5mg  all other days   Yes Historical Provider, MD  nitroGLYCERIN (NITROSTAT) 0.4 MG SL tablet Place 1 tablet (0.4 mg total) under the tongue every 5 (five) minutes as needed for chest pain. 12/14/12   Herminio Commons, MD  torsemide (DEMADEX) 20 MG tablet Take 1 tablet (20 mg total) by mouth 2 (two) times daily. Take 1 tablet daily 12/21/13   Merryl Hacker, MD   BP 101/31  Pulse 77  Temp(Src) 98.4 F (36.9 C) (Oral)  Resp 26  SpO2 90% Physical Exam  Nursing note and vitals reviewed. Constitutional: She is oriented to person, place, and time. No distress.  Elderly  HENT:  Head: Normocephalic and atraumatic.  Mouth/Throat: Oropharynx is clear and moist.  Eyes: Pupils are equal, round, and reactive to light.  Cardiovascular: Normal rate and normal heart sounds.   No murmur heard. Irregular rhythm  Pulmonary/Chest: Effort normal and breath sounds  normal. No respiratory distress. She has no wheezes. She has no rales.  Abdominal: Soft. Bowel sounds are normal. There is no tenderness. There is no rebound and no guarding.  Musculoskeletal: She exhibits edema.  2+ bilateral lower extremity edema  Neurological: She is alert and oriented to person, place, and time.  Skin: Skin is warm and dry.  Psychiatric: She has a normal mood and affect.    ED Course  Procedures DIAGNOSTIC STUDIES: Oxygen Saturation is 91% on RA, adequate by my interpretation.    COORDINATION OF CARE: 7:23 AM-Discussed treatment plan which includes labs and imaging with pt at bedside and pt agreed to plan.   Labs Review Labs Reviewed  CBC WITH DIFFERENTIAL - Abnormal; Notable for the following:  RBC 2.55 (*)    Hemoglobin 8.3 (*)    HCT 25.7 (*)    MCV 100.8 (*)    All other components within normal limits  COMPREHENSIVE METABOLIC PANEL - Abnormal; Notable for the following:    Glucose, Bld 130 (*)    BUN 118 (*)    Creatinine, Ser 2.28 (*)    Albumin 3.4 (*)    Alkaline Phosphatase 122 (*)    GFR calc non Af Amer 17 (*)    GFR calc Af Amer 20 (*)    All other components within normal limits  URINALYSIS, ROUTINE W REFLEX MICROSCOPIC - Abnormal; Notable for the following:    Specific Gravity, Urine <1.005 (*)    Hgb urine dipstick SMALL (*)    Leukocytes, UA TRACE (*)    All other components within normal limits  PRO B NATRIURETIC PEPTIDE - Abnormal; Notable for the following:    Pro B Natriuretic peptide (BNP) 11458.0 (*)    All other components within normal limits  URINE MICROSCOPIC-ADD ON - Abnormal; Notable for the following:    Squamous Epithelial / LPF FEW (*)    Bacteria, UA FEW (*)    All other components within normal limits  PROTIME-INR - Abnormal; Notable for the following:    Prothrombin Time 27.6 (*)    INR 2.57 (*)    All other components within normal limits  LIPASE, BLOOD  LACTIC ACID, PLASMA  TROPONIN I    Imaging  Review Dg Abd Acute W/chest  12/21/2013   CLINICAL DATA:  Upper and epigastric region abdominal pain with intermittent nausea  EXAM: ACUTE ABDOMEN SERIES (ABDOMEN 2 VIEW & CHEST 1 VIEW)  COMPARISON:  Chest radiograph December 16, 2013; CT chest, abdomen, and pelvis November 15, 2012  FINDINGS: PA chest: There is underlying emphysema. There is cardiomegaly with bibasilar interstitial edema and small effusions. No airspace consolidation. Pacemaker leads are attached to the right atrium and right ventricle.  Supine and upright abdomen: There is moderate stool throughout colon. The bowel gas pattern is unremarkable. No obstruction or free air. There are surgical clips in the right upper quadrant. There are phleboliths in the pelvis.  IMPRESSION: Underlying congestive heart failure. Bowel gas pattern unremarkable. Moderate stool throughout colon.   Electronically Signed   By: Lowella Grip M.D.   On: 12/21/2013 08:44     EKG Interpretation   Date/Time:  Thursday December 21 2013 07:05:42 EDT Ventricular Rate:  76 PR Interval:  183 QRS Duration: 151 QT Interval:  428 QTC Calculation: 481 R Axis:   -44 Text Interpretation:  Accelerated junctional rhythm Left bundle branch  block INversion of QRS complexes when compared to prior; lead placement?  Confirmed by Dina Rich  MD, Loma Sousa ([id number]) on 12/21/2013 7:13:04 AM      MDM   Final diagnoses:  Failure to thrive in adult  Chronic diastolic heart failure    Patient presents with complaints of nausea and generally not feeling well. Per the patient's son, she is being set up for assisted living on Monday. Physical exam is largely unremarkable with the exception of lower extremity swelling. Lab work notable for a baseline creatinine of 2.28 and chronic uremia.  Patient does have BNP history did upwards and is now close to 12,000. Given her lower extremity edema, patient was given IV Lasix. Chest x-ray similar to prior. Patient did have one documented  pulse ox of 84% on room air. However, my evaluation, patient satting 92-94% on room air and  was able to ambulate and maintain her oxygen saturations. Discussed the patient with both Dr. Bronson Ing and her primary Dr. Aletha Halim place patient back on torsemide 20 mg twice a day. Given that she is able to maintain oxygen saturation and is not appear to be in decompensated heart failure, feel she is safe for discharge home. She has home health in place as well as followup with her primary physician.  After history, exam, and medical workup I feel the patient has been appropriately medically screened and is safe for discharge home. Pertinent diagnoses were discussed with the patient. Patient was given return precautions.   I personally performed the services described in this documentation, which was scribed in my presence. The recorded information has been reviewed and is accurate.    Merryl Hacker, MD 12/21/13 [phone]

## 2013-12-21 NOTE — ED Notes (Signed)
MD at bedside. 

## 2013-12-21 NOTE — ED Notes (Signed)
C/o persistent nausea and epigastric pain over past week.

## 2013-12-21 NOTE — Discharge Instructions (Signed)
You were evaluated today.  There was no indication for admission; however, we will adjust your diuretic per both cardiology and your PCP recommendation.  You will start torsemide 20 mg twice daily.  You should STOP furosemide.  Heart Failure Heart failure is a condition in which the heart has trouble pumping blood. This means your heart does not pump blood efficiently for your body to work well. In some cases of heart failure, fluid may back up into your lungs or you may have swelling (edema) in your lower legs. Heart failure is usually a long-term (chronic) condition. It is important for you to take good care of yourself and follow your health care provider's treatment plan. CAUSES  Some health conditions can cause heart failure. Those health conditions include:  High blood pressure (hypertension). Hypertension causes the heart muscle to work harder than normal. When pressure in the blood vessels is high, the heart needs to pump (contract) with more force in order to circulate blood throughout the body. High blood pressure eventually causes the heart to become stiff and weak.  Coronary artery disease (CAD). CAD is the buildup of cholesterol and fat (plaque) in the arteries of the heart. The blockage in the arteries deprives the heart muscle of oxygen and blood. This can cause chest pain and may lead to a heart attack. High blood pressure can also contribute to CAD.  Heart attack (myocardial infarction). A heart attack occurs when one or more arteries in the heart become blocked. The loss of oxygen damages the muscle tissue of the heart. When this happens, part of the heart muscle dies. The injured tissue does not contract as well and weakens the heart's ability to pump blood.  Abnormal heart valves. When the heart valves do not open and close properly, it can cause heart failure. This makes the heart muscle pump harder to keep the blood flowing.  Heart muscle disease (cardiomyopathy or myocarditis).  Heart muscle disease is damage to the heart muscle from a variety of causes. These can include drug or alcohol abuse, infections, or unknown reasons. These can increase the risk of heart failure.  Lung disease. Lung disease makes the heart work harder because the lungs do not work properly. This can cause a strain on the heart, leading it to fail.  Diabetes. Diabetes increases the risk of heart failure. High blood sugar contributes to high fat (lipid) levels in the blood. Diabetes can also cause slow damage to tiny blood vessels that carry important nutrients to the heart muscle. When the heart does not get enough oxygen and food, it can cause the heart to become weak and stiff. This leads to a heart that does not contract efficiently.  Other conditions can contribute to heart failure. These include abnormal heart rhythms, thyroid problems, and low blood counts (anemia). Certain unhealthy behaviors can increase the risk of heart failure, including:  Being overweight.  Smoking or chewing tobacco.  Eating foods high in fat and cholesterol.  Abusing illicit drugs or alcohol.  Lacking physical activity. SYMPTOMS  Heart failure symptoms may vary and can be hard to detect. Symptoms may include:  Shortness of breath with activity, such as climbing stairs.  Persistent cough.  Swelling of the feet, ankles, legs, or abdomen.  Unexplained weight gain.  Difficulty breathing when lying flat (orthopnea).  Waking from sleep because of the need to sit up and get more air.  Rapid heartbeat.  Fatigue and loss of energy.  Feeling light-headed, dizzy, or close to fainting.  Loss of appetite.  Nausea.  Increased urination during the night (nocturia). DIAGNOSIS  A diagnosis of heart failure is based on your history, symptoms, physical examination, and diagnostic tests. Diagnostic tests for heart failure may include:  Echocardiography.  Electrocardiography.  Chest X-ray.  Blood  tests.  Exercise stress test.  Cardiac angiography.  Radionuclide scans. TREATMENT  Treatment is aimed at managing the symptoms of heart failure. Medicines, behavioral changes, or surgical intervention may be necessary to treat heart failure.  Medicines to help treat heart failure may include:  Angiotensin-converting enzyme (ACE) inhibitors. This type of medicine blocks the effects of a blood protein called angiotensin-converting enzyme. ACE inhibitors relax (dilate) the blood vessels and help lower blood pressure.  Angiotensin receptor blockers (ARBs). This type of medicine blocks the actions of a blood protein called angiotensin. Angiotensin receptor blockers dilate the blood vessels and help lower blood pressure.  Water pills (diuretics). Diuretics cause the kidneys to remove salt and water from the blood. The extra fluid is removed through urination. This loss of extra fluid lowers the volume of blood the heart pumps.  Beta blockers. These prevent the heart from beating too fast and improve heart muscle strength.  Digitalis. This increases the force of the heartbeat.  Healthy behavior changes include:  Obtaining and maintaining a healthy weight.  Stopping smoking or chewing tobacco.  Eating heart-healthy foods.  Limiting or avoiding alcohol.  Stopping illicit drug use.  Physical activity as directed by your health care provider.  Surgical treatment for heart failure may include:  A procedure to open blocked arteries, repair damaged heart valves, or remove damaged heart muscle tissue.  A pacemaker to improve heart muscle function and control certain abnormal heart rhythms.  An internal cardioverter defibrillator to treat certain serious abnormal heart rhythms.  A left ventricular assist device (LVAD) to assist the pumping ability of the heart. HOME CARE INSTRUCTIONS   Take medicines only as directed by your health care provider. Medicines are important in reducing  the workload of your heart, slowing the progression of heart failure, and improving your symptoms.  Do not stop taking your medicine unless directed by your health care provider.  Do not skip any dose of medicine.  Refill your prescriptions before you run out of medicine. Your medicines are needed every day.  Engage in moderate physical activity if directed by your health care provider. Moderate physical activity can benefit some people. The elderly and people with severe heart failure should consult with a health care provider for physical activity recommendations.  Eat heart-healthy foods. Food choices should be free of trans fat and low in saturated fat, cholesterol, and salt (sodium). Healthy choices include fresh or frozen fruits and vegetables, fish, lean meats, legumes, fat-free or low-fat dairy products, and whole grain or high fiber foods. Talk to a dietitian to learn more about heart-healthy foods.  Limit sodium if directed by your health care provider. Sodium restriction may reduce symptoms of heart failure in some people. Talk to a dietitian to learn more about heart-healthy seasonings.  Use healthy cooking methods. Healthy cooking methods include roasting, grilling, broiling, baking, poaching, steaming, or stir-frying. Talk to a dietitian to learn more about healthy cooking methods.  Limit fluids if directed by your health care provider. Fluid restriction may reduce symptoms of heart failure in some people.  Weigh yourself every day. Daily weights are important in the early recognition of excess fluid. You should weigh yourself every morning after you urinate and before you eat  breakfast. Wear the same amount of clothing each time you weigh yourself. Record your daily weight. Provide your health care provider with your weight record.  Monitor and record your blood pressure if directed by your health care provider.  Check your pulse if directed by your health care provider.  Lose  weight if directed by your health care provider. Weight loss may reduce symptoms of heart failure in some people.  Stop smoking or chewing tobacco. Nicotine makes your heart work harder by causing your blood vessels to constrict. Do not use nicotine gum or patches before talking to your health care provider.  Keep all follow-up visits as directed by your health care provider. This is important.  Limit alcohol intake to no more than 1 drink per day for nonpregnant women and 2 drinks per day for men. One drink equals 12 ounces of beer, 5 ounces of wine, or 1 ounces of hard liquor. Drinking more than that is harmful to your heart. Tell your health care provider if you drink alcohol several times a week. Talk with your health care provider about whether alcohol is safe for you. If your heart has already been damaged by alcohol or you have severe heart failure, drinking alcohol should be stopped completely.  Stop illicit drug use.  Stay up-to-date with immunizations. It is especially important to prevent respiratory infections through current pneumococcal and influenza immunizations.  Manage other health conditions such as hypertension, diabetes, thyroid disease, or abnormal heart rhythms as directed by your health care provider.  Learn to manage stress.  Plan rest periods when fatigued.  Learn strategies to manage high temperatures. If the weather is extremely hot:  Avoid vigorous physical activity.  Use air conditioning or fans or seek a cooler location.  Avoid caffeine and alcohol.  Wear loose-fitting, lightweight, and light-colored clothing.  Learn strategies to manage cold temperatures. If the weather is extremely cold:  Avoid vigorous physical activity.  Layer clothes.  Wear mittens or gloves, a hat, and a scarf when going outside.  Avoid alcohol.  Obtain ongoing education and support as needed.  Participate in or seek rehabilitation as needed to maintain or improve  independence and quality of life. SEEK MEDICAL CARE IF:   Your weight increases by 03 lb/1.4 kg in 1 day or 05 lb/2.3 kg in a week.  You have increasing shortness of breath that is unusual for you.  You are unable to participate in your usual physical activities.  You tire easily.  You cough more than normal, especially with physical activity.  You have any or more swelling in areas such as your hands, feet, ankles, or abdomen.  You are unable to sleep because it is hard to breathe.  You feel like your heart is beating fast (palpitations).  You become dizzy or light-headed upon standing up. SEEK IMMEDIATE MEDICAL CARE IF:   You have difficulty breathing.  There is a change in mental status such as decreased alertness or difficulty with concentration.  You have a pain or discomfort in your chest.  You have an episode of fainting (syncope). MAKE SURE YOU:   Understand these instructions.  Will watch your condition.  Will get help right away if you are not doing well or get worse. Document Released: 03/02/2005 Document Revised: 07/17/2013 Document Reviewed: 04/01/2012 [facility] Patient Information 2015 [place], [place]. This information is not intended to replace advice given to you by your health care provider. Make sure you discuss any questions you have with your health care  provider. Failure to Thrive, Adult Adult failure to thrive is a condition that some older people develop. People with this condition are able to do fewer and fewer activities over time. They may lose interest in being with friends or may not want to eat or drink. This is not a normal part of aging. Many things can cause this. Health problems, long-term disease, depression, bad eating habits, cognitive impairment, or disability may play a role in the development of this condition. Most of the time, it is important to treat whatever is causing failure to thrive. Sometimes, though, it might not be possible to  treat the condition. The person could be nearing the end of life. Then, treatment could make the person suffer longer. CAUSES Sometimes, no specific cause can be found. Factors that have been linked to failure to thrive include:  Diseases and medical conditions, such as:  Cancer.  Diabetes.  Stomach and intestinal (gastrointestinal) problems.  Lung disease.  Liver disease.  Kidney disease.  Heart problems.  Thyroid disease.  Neurologic problems.  Vitamin deficiencies.  Disability. This may be the result of:  A broken hip.  A stroke.  Very bad arthritis.  Infections that last a long time.  A long recovery from a surgery.  Mental health issues.  Medicines.  Eating problems.  Medicine for certain conditions. These conditions include:  Parkinson's disease.  Seizure disorder.  Anxiety.  Pain.  High blood pressure.  Depression.  Infections. SYMPTOMS  Losing weight (more than 5% of total body weight).  Getting more tired than usual after an activity.  Having trouble getting up after sitting.  Not being hungry or thirsty.  Not getting out of bed.  Not wanting to do usual activities.  Being depressed.  Getting infections often.  Having bedsores.  Taking a long time to recover after an injury or a surgery.  Weakness. DIAGNOSIS A physical exam can help a caregiver decide if someone has adult failure to thrive. This may include questions about the person's health presently and in the past. It also may include questions about behavior and mood, such as:  Has activity changed?  Does the person seem sad?  Are eating habits different? The caregiver may ask for a list of all medicines taken because certain medicines can lead to this condition. The list should include prescription and over-the-counter medicines. The caregiver will likely order some tests. These may include:  Blood tests to check for infection, certain diseases, deficiencies,  hormone levels, malnutrition, or dehydration.  Urine tests to check for urinary tract infection or kidney failure.  Imaging tests. Examples are an X-ray, a computed tomography (CT) scan, and magnetic resonance imaging (MRI).  Hearing tests.  Vision tests.  Cognitivetests to check thinking ability.  Activity tests to see if the person can do basic tasks like bathing and dressing. There also are tests to check if someone can shop, cook, or move around safely. The caregiver will check if the person is eating enough healthy food. This may include:  Checking weight.  Having blood tested for cholesterol and protein levels.  Seeing if anything else might be making it hard to eat (tooth problems, poorly fitted dentures, trouble swallowing). The person may need to see a specialist to help with diagnosis or treatment. These specialists may include a speech therapist, physical therapist, occupational therapist, dietitian, or social worker.  TREATMENT Treatment for adult failure to thrive depends on the cause. Caregivers also must decide if a treatment has a good chance of  working. It often takes a team of caregivers to find the right treatment. Options may include:  Treatments to cure a disease that can cause adult failure to thrive.  Talk therapy or medicine to treat depression.  A better diet. Eating more often, adding nutritional supplements between meals, or taking vitamins may be suggested. Sometimes, medicine is prescribed to boost appetite.  Medicine changes or stopping a medicine.  Physical therapy.  Moving to a place that offers more aid. HOME CARE INSTRUCTIONS What needs to be done at home varies from person to person. This will depend on what caused the condition and how it is treated. However, basic guidelines include:  Taking any medicine prescribed by the caregiver. Following the directions carefully is important.  Eating healthy foods. There should be enough calories in  each meal. Ask the caregiver if vitamins or nutritional supplements should be taken between meals. Consider talking with a dietitian.  Exercising. Strength training is important. A physical therapist can help set up an exercise program that fits the person.  Making sure the person is safe at home.  Talking with caregivers about what should be done if the person can no longer make decisions for himself or herself. SEEK MEDICAL CARE IF:  There are any questions about medicines.  There are questions about the effects of treatment.  The person is not able to eat well.  The person is not able to move around.  The person feels very sad or hopeless. SEEK IMMEDIATE MEDICAL CARE IF:   The person has thoughts of ending his or her life.  The person cannot eat or drink.  The person does not get out of bed.  Staying at home is no longer safe.  The person has a fever. Document Released: 05/25/2011 Document Reviewed: 05/25/2011 [facility] Patient Information 2015 [place]. This information is not intended to replace advice given to you by your health care provider. Make sure you discuss any questions you have with your health care provider.

## 2013-12-21 NOTE — ED Notes (Signed)
Pt void without difficulty.

## 2013-12-22 ENCOUNTER — Emergency Department (HOSPITAL_COMMUNITY): Payer: Medicare Other

## 2013-12-22 ENCOUNTER — Encounter (HOSPITAL_COMMUNITY): Payer: Self-pay | Admitting: Emergency Medicine

## 2013-12-22 ENCOUNTER — Inpatient Hospital Stay (HOSPITAL_COMMUNITY)
Admission: EM | Admit: 2013-12-22 | Discharge: 2013-12-26 | DRG: 292 | Disposition: A | Discharge status: Skilled Nursing Facility | Payer: Medicare Other | Attending: Pulmonary Disease | Admitting: Pulmonary Disease

## 2013-12-22 DIAGNOSIS — Z7982 Long term (current) use of aspirin: Secondary | ICD-10-CM

## 2013-12-22 DIAGNOSIS — I5033 Acute on chronic diastolic (congestive) heart failure: Principal | ICD-10-CM | POA: Diagnosis present

## 2013-12-22 DIAGNOSIS — Z90710 Acquired absence of both cervix and uterus: Secondary | ICD-10-CM

## 2013-12-22 DIAGNOSIS — J449 Chronic obstructive pulmonary disease, unspecified: Secondary | ICD-10-CM | POA: Diagnosis present

## 2013-12-22 DIAGNOSIS — Z7901 Long term (current) use of anticoagulants: Secondary | ICD-10-CM

## 2013-12-22 DIAGNOSIS — I2510 Atherosclerotic heart disease of native coronary artery without angina pectoris: Secondary | ICD-10-CM | POA: Diagnosis present

## 2013-12-22 DIAGNOSIS — D649 Anemia, unspecified: Secondary | ICD-10-CM | POA: Diagnosis present

## 2013-12-22 DIAGNOSIS — K717 Toxic liver disease with fibrosis and cirrhosis of liver: Secondary | ICD-10-CM

## 2013-12-22 DIAGNOSIS — I480 Paroxysmal atrial fibrillation: Secondary | ICD-10-CM | POA: Diagnosis present

## 2013-12-22 DIAGNOSIS — Z950 Presence of cardiac pacemaker: Secondary | ICD-10-CM

## 2013-12-22 DIAGNOSIS — I252 Old myocardial infarction: Secondary | ICD-10-CM

## 2013-12-22 DIAGNOSIS — Z8249 Family history of ischemic heart disease and other diseases of the circulatory system: Secondary | ICD-10-CM

## 2013-12-22 DIAGNOSIS — N179 Acute kidney failure, unspecified: Secondary | ICD-10-CM | POA: Diagnosis present

## 2013-12-22 DIAGNOSIS — N183 Chronic kidney disease, stage 3 (moderate): Secondary | ICD-10-CM | POA: Diagnosis present

## 2013-12-22 DIAGNOSIS — Z833 Family history of diabetes mellitus: Secondary | ICD-10-CM

## 2013-12-22 DIAGNOSIS — Z515 Encounter for palliative care: Secondary | ICD-10-CM

## 2013-12-22 DIAGNOSIS — R188 Other ascites: Secondary | ICD-10-CM | POA: Diagnosis present

## 2013-12-22 DIAGNOSIS — M1990 Unspecified osteoarthritis, unspecified site: Secondary | ICD-10-CM | POA: Diagnosis present

## 2013-12-22 DIAGNOSIS — I472 Ventricular tachycardia: Secondary | ICD-10-CM | POA: Diagnosis present

## 2013-12-22 DIAGNOSIS — I5023 Acute on chronic systolic (congestive) heart failure: Secondary | ICD-10-CM

## 2013-12-22 DIAGNOSIS — I129 Hypertensive chronic kidney disease with stage 1 through stage 4 chronic kidney disease, or unspecified chronic kidney disease: Secondary | ICD-10-CM | POA: Diagnosis present

## 2013-12-22 DIAGNOSIS — Z809 Family history of malignant neoplasm, unspecified: Secondary | ICD-10-CM

## 2013-12-22 DIAGNOSIS — K589 Irritable bowel syndrome without diarrhea: Secondary | ICD-10-CM | POA: Diagnosis present

## 2013-12-22 DIAGNOSIS — Z66 Do not resuscitate: Secondary | ICD-10-CM | POA: Diagnosis present

## 2013-12-22 DIAGNOSIS — I350 Nonrheumatic aortic (valve) stenosis: Secondary | ICD-10-CM | POA: Diagnosis present

## 2013-12-22 DIAGNOSIS — F419 Anxiety disorder, unspecified: Secondary | ICD-10-CM | POA: Diagnosis present

## 2013-12-22 DIAGNOSIS — L89151 Pressure ulcer of sacral region, stage 1: Secondary | ICD-10-CM | POA: Diagnosis present

## 2013-12-22 DIAGNOSIS — Z856 Personal history of leukemia: Secondary | ICD-10-CM

## 2013-12-22 DIAGNOSIS — K7460 Unspecified cirrhosis of liver: Secondary | ICD-10-CM | POA: Diagnosis present

## 2013-12-22 DIAGNOSIS — N189 Chronic kidney disease, unspecified: Secondary | ICD-10-CM

## 2013-12-22 DIAGNOSIS — K219 Gastro-esophageal reflux disease without esophagitis: Secondary | ICD-10-CM | POA: Diagnosis present

## 2013-12-22 LAB — CBC WITH DIFFERENTIAL/PLATELET
BASOS ABS: 0 10*3/uL (ref 0.0–0.1)
BASOS PCT: 0 % (ref 0–1)
EOS ABS: 0 10*3/uL (ref 0.0–0.7)
EOS PCT: 0 % (ref 0–5)
HCT: 22.9 % — ABNORMAL LOW (ref 36.0–46.0)
Hemoglobin: 7.1 g/dL — ABNORMAL LOW (ref 12.0–15.0)
Lymphocytes Relative: 11 % — ABNORMAL LOW (ref 12–46)
Lymphs Abs: 1.4 10*3/uL (ref 0.7–4.0)
MCH: 31.3 pg (ref 26.0–34.0)
MCHC: 31 g/dL (ref 30.0–36.0)
MCV: 100.9 fL — AB (ref 78.0–100.0)
Monocytes Absolute: 0.6 10*3/uL (ref 0.1–1.0)
Monocytes Relative: 5 % (ref 3–12)
NEUTROS PCT: 83 % — AB (ref 43–77)
Neutro Abs: 10.3 10*3/uL — ABNORMAL HIGH (ref 1.7–7.7)
PLATELETS: 152 10*3/uL (ref 150–400)
RBC: 2.27 MIL/uL — ABNORMAL LOW (ref 3.87–5.11)
RDW: 14.7 % (ref 11.5–15.5)
WBC: 12.4 10*3/uL — ABNORMAL HIGH (ref 4.0–10.5)

## 2013-12-22 LAB — I-STAT TROPONIN, ED: Troponin i, poc: 0.02 ng/mL (ref 0.00–0.08)

## 2013-12-22 LAB — I-STAT CHEM 8, ED
BUN: 124 mg/dL — ABNORMAL HIGH (ref 6–23)
Calcium, Ion: 1.15 mmol/L (ref 1.13–1.30)
Chloride: 102 mEq/L (ref 96–112)
Creatinine, Ser: 2.6 mg/dL — ABNORMAL HIGH (ref 0.50–1.10)
GLUCOSE: 126 mg/dL — AB (ref 70–99)
HEMATOCRIT: 31 % — AB (ref 36.0–46.0)
HEMOGLOBIN: 10.5 g/dL — AB (ref 12.0–15.0)
POTASSIUM: 4.8 meq/L (ref 3.7–5.3)
SODIUM: 137 meq/L (ref 137–147)
TCO2: 28 mmol/L (ref 0–100)

## 2013-12-22 LAB — MRSA PCR SCREENING: MRSA BY PCR: NEGATIVE

## 2013-12-22 LAB — PROTIME-INR
INR: 2.77 — ABNORMAL HIGH (ref 0.00–1.49)
Prothrombin Time: 29.3 seconds — ABNORMAL HIGH (ref 11.6–15.2)

## 2013-12-22 LAB — PRO B NATRIURETIC PEPTIDE: Pro B Natriuretic peptide (BNP): 12805 pg/mL — ABNORMAL HIGH (ref 0–450)

## 2013-12-22 MED ORDER — FUROSEMIDE 10 MG/ML IJ SOLN
20.0000 mg | Freq: Every day | INTRAMUSCULAR | Status: DC
  Administered 2013-12-23 – 2013-12-24 (×2): 20 mg via INTRAVENOUS
  Filled 2013-12-22 (×2): qty 2

## 2013-12-22 MED ORDER — ONDANSETRON HCL 4 MG/2ML IJ SOLN: 4.0000 mg | Freq: Four times a day (QID) | INTRAMUSCULAR | Status: DC | PRN

## 2013-12-22 MED ORDER — SODIUM CHLORIDE 0.9 % IJ SOLN: 3.0000 mL | INTRAMUSCULAR | Status: DC | PRN

## 2013-12-22 MED ORDER — OXYCODONE HCL 5 MG PO TABS
5.0000 mg | ORAL_TABLET | Freq: Four times a day (QID) | ORAL | Status: DC | PRN
  Administered 2013-12-22 – 2013-12-25 (×4): 5 mg via ORAL
  Filled 2013-12-22 (×4): qty 1

## 2013-12-22 MED ORDER — FUROSEMIDE 10 MG/ML IJ SOLN
40.0000 mg | Freq: Once | INTRAMUSCULAR | Status: AC
  Administered 2013-12-22: 40 mg via INTRAVENOUS
  Filled 2013-12-22: qty 4

## 2013-12-22 MED ORDER — WARFARIN SODIUM 5 MG PO TABS
2.5000 mg | ORAL_TABLET | ORAL | Status: DC
Start: 2013-12-23 — End: 2013-12-23

## 2013-12-22 MED ORDER — SODIUM CHLORIDE 0.9 % IJ SOLN
3.0000 mL | Freq: Two times a day (BID) | INTRAMUSCULAR | Status: DC
  Administered 2013-12-22 – 2013-12-26 (×7): 3 mL via INTRAVENOUS

## 2013-12-22 MED ORDER — PRAVASTATIN SODIUM 40 MG PO TABS
40.0000 mg | ORAL_TABLET | Freq: Every day | ORAL | Status: DC
  Administered 2013-12-22 – 2013-12-25 (×4): 40 mg via ORAL
  Filled 2013-12-22 (×4): qty 1

## 2013-12-22 MED ORDER — WARFARIN SODIUM 2.5 MG PO TABS
1.2500 mg | ORAL_TABLET | ORAL | Status: DC
Start: 2013-12-22 — End: 2013-12-23
  Administered 2013-12-22: 1.25 mg via ORAL
  Filled 2013-12-22: qty 1

## 2013-12-22 MED ORDER — LOSARTAN POTASSIUM 50 MG PO TABS
100.0000 mg | ORAL_TABLET | Freq: Every day | ORAL | Status: DC
  Filled 2013-12-22: qty 2

## 2013-12-22 MED ORDER — VERAPAMIL HCL ER 180 MG PO TBCR
180.0000 mg | EXTENDED_RELEASE_TABLET | Freq: Every day | ORAL | Status: DC
  Administered 2013-12-22 – 2013-12-25 (×2): 180 mg via ORAL
  Filled 2013-12-22 (×4): qty 1

## 2013-12-22 MED ORDER — ASPIRIN EC 81 MG PO TBEC
81.0000 mg | DELAYED_RELEASE_TABLET | Freq: Every day | ORAL | Status: DC
Start: 2013-12-23 — End: 2013-12-26
  Administered 2013-12-23 – 2013-12-26 (×4): 81 mg via ORAL
  Filled 2013-12-22 (×4): qty 1

## 2013-12-22 MED ORDER — WARFARIN - PHYSICIAN DOSING INPATIENT: Freq: Every day | Status: DC

## 2013-12-22 MED ORDER — ALBUTEROL SULFATE (2.5 MG/3ML) 0.083% IN NEBU
5.0000 mg | INHALATION_SOLUTION | Freq: Once | RESPIRATORY_TRACT | Status: AC
  Administered 2013-12-22: 5 mg via RESPIRATORY_TRACT
  Filled 2013-12-22: qty 6

## 2013-12-22 MED ORDER — OXYCODONE-ACETAMINOPHEN 5-325 MG PO TABS
1.0000 | ORAL_TABLET | Freq: Once | ORAL | Status: AC
  Administered 2013-12-22: 1 via ORAL
  Filled 2013-12-22: qty 1

## 2013-12-22 MED ORDER — METOPROLOL TARTRATE 25 MG PO TABS
25.0000 mg | ORAL_TABLET | Freq: Two times a day (BID) | ORAL | Status: DC
  Administered 2013-12-22 – 2013-12-23 (×3): 25 mg via ORAL
  Filled 2013-12-22 (×3): qty 1

## 2013-12-22 MED ORDER — NITROGLYCERIN 0.4 MG SL SUBL: 0.4000 mg | SUBLINGUAL_TABLET | SUBLINGUAL | Status: DC | PRN

## 2013-12-22 MED ORDER — SODIUM CHLORIDE 0.9 % IV SOLN
250.0000 mL | INTRAVENOUS | Status: DC | PRN
  Administered 2013-12-23: 250 mL via INTRAVENOUS

## 2013-12-22 NOTE — ED Notes (Signed)
Son at bedside, per him pts home health nurse called 911 due to patients "oxygen levels were in the 70's" and patient was "becoming unresponsive". Pt denies feeling any different than her "usual".

## 2013-12-22 NOTE — Progress Notes (Signed)
During assessment the patient was found to have a stage 2 on her sacral crease.  I was going to place the sacral protective dressing and the patient stated that she did not want the dressing placed because it hurt her.  I explained to her how important the dressing was due to risk for further skin break down and she stated that she only wanted cream placed on the site no dressing.

## 2013-12-22 NOTE — ED Notes (Signed)
Report given to Charletta Cousin on [address], all questions answered.

## 2013-12-22 NOTE — ED Notes (Signed)
Pt here for evaluation of SOB. Per EMS, home health nurse called them because pt was becoming unresponsive

## 2013-12-22 NOTE — ED Notes (Signed)
Per patient she has a "terrible sore on her bottom", female visitor agrees and also states that she has a sore under one of her breast, but cant remember which. Patient denied letting me looking her the aforementioned sores stating "everyone has looked". Per pt and family both of these sores are chronic.

## 2013-12-22 NOTE — ED Provider Notes (Signed)
CSN: [id number]     Arrival date & time 12/22/13  1214 History  This chart was scribed for Sharyon Cable, MD by Ludger Nutting, ED Scribe. This patient was seen in room APA19/APA19 and the patient's care was started 12:35 PM.    Chief Complaint  Patient presents with  . Shortness of Breath    Patient is a 80 y.o. female presenting with shortness of breath. The history is provided by the patient, a relative and the EMS personnel. No language interpreter was used.  Shortness of Breath Severity:  Moderate Onset quality:  Gradual Duration:  1 day Timing:  Constant Relieved by:  Oxygen Worsened by:  Coughing Associated symptoms: cough and sputum production   Associated symptoms: no abdominal pain, no chest pain, no fever and no vomiting     HPI Comments: [name] is a 80 y.o. female brought in by ambulance, with past medical history of MI, CHF who presents to the Emergency Department complaining of 1 day of constant SOB with associated cough productive of yellow/green sputum. Per family, patient's home health nurse called EMS due to low blood pressure (50-09 systolic) and low o2 saturation (70-80%) earlier today. Patient also has constant, worsened bilateral lower leg swelling which is chronic for her. Per family, patient had an MI on 11/06/13 and has been seen in the ED 4 times since then. She denies fever, vomiting, abdominal pain, new or worsened back pain, chest pain.   Past Medical History  Diagnosis Date  . Sick sinus syndrome     Dual-chamber pacemaker in 11/2006  . Aortic stenosis     Moderate to severe  . Left bundle branch block   . Chronic diastolic heart failure   . Coronary atherosclerosis of native coronary artery     Cardiac catheterization 10/09: 50% left anterior descending; 80% PDA;  . Epistaxis     Mild with negative ENT evaluation  . Dizziness     Chronic  . Osteoarthritis   . Irritable bowel syndrome   . Urinary frequency   . Gastroesophageal reflux disease      Esophageal dilatation for stricture in 09/2011  . Rosacea   . Chronic lymphocytic leukemia     Stage I with anemia H./H. of 9/27 in 11/09 with normal MCV; iron deficiency in bone marrow  . Orthostatic hypotension   . Diverticulosis   . Adenomatous polyp October 2010    (Proximal Small bowel) with high grade dysplasia in polypoid lesion straddling D1/D2  . CKD (chronic kidney disease) stage 3, GFR 30-59 ml/min   . Urethral caruncle   . Iron deficiency anemia secondary to blood loss (chronic) 01/19/2013  . History of rectal bleeding 01/19/2013  . PAF (paroxysmal atrial fibrillation)   . Mitral regurgitation     Moderate to severe   Past Surgical History  Procedure Laterality Date  . Cholecystectomy    . Cervical spine surgery    . Lumbar disc surgery    . Bladder suspension    . Abdominal hysterectomy  1972  . Cataract extraction      Bilateral  . Esophagogastroduodenoscopy  01/09/09    Rourk -noncritical Schatzki's ring, small hiatal hernia, fundal gland polyps, bile-stained gastric mucosa, polypoid lesion straddling D1 and D2 1-2 cm, localized lymphangitic-appearing mucosa at D2 biopsied (adenomatous polyp with high grade glandular dysplasia  . A-v cardiac pacemaker insertion  11/2006    [employer]   Family History  Problem Relation Age of Onset  . Heart failure Father   .  Cancer Father   . Diabetes Mother    History  Substance Use Topics  . Smoking status: Never Smoker   . Smokeless tobacco: Never Used  . Alcohol Use: No   OB History   Grav Para Term Preterm Abortions TAB SAB Ect Mult Living   2 2 2       2      Review of Systems  Constitutional: Negative for fever.  Respiratory: Positive for cough, sputum production and shortness of breath.   Cardiovascular: Positive for leg swelling. Negative for chest pain.  Gastrointestinal: Negative for vomiting and abdominal pain.  Musculoskeletal: Negative for back pain.  All other systems reviewed and are  negative.     Allergies  Vesicare; Detrol; Sanctura; Sulfa antibiotics; Sulfonamide derivatives; and Trimethoprim  Home Medications   Prior to Admission medications   Medication Sig Start Date End Date Taking? Authorizing Provider  aspirin EC 81 MG EC tablet Take 1 tablet (81 mg total) by mouth daily. 11/14/13   [name], MD  HYDROcodone-acetaminophen (NORCO/VICODIN) 5-325 MG per tablet Take 1 tablet by mouth every 4 (four) hours as needed for severe pain.    Historical Provider, MD  losartan (COZAAR) 100 MG tablet Take 100 mg by mouth daily.    Historical Provider, MD  metoprolol tartrate (LOPRESSOR) 25 MG tablet Take 25 mg by mouth 2 (two) times daily.    Historical Provider, MD  Multiple Vitamins-Minerals (PRESERVISION AREDS 2) CAPS Take 1 capsule by mouth 2 (two) times daily.    Historical Provider, MD  nitroGLYCERIN (NITROSTAT) 0.4 MG SL tablet Place 1 tablet (0.4 mg total) under the tongue every 5 (five) minutes as needed for chest pain. 12/14/12   Herminio Commons, MD  pravastatin (PRAVACHOL) 40 MG tablet Take 40 mg by mouth at bedtime.    Historical Provider, MD  torsemide (DEMADEX) 20 MG tablet Take 1 tablet (20 mg total) by mouth 2 (two) times daily. Take 1 tablet daily 12/21/13   Merryl Hacker, MD  verapamil (CALAN-SR) 180 MG CR tablet Take 180 mg by mouth at bedtime.    Historical Provider, MD  warfarin (COUMADIN) 2.5 MG tablet Take 1.25-2.5 mg by mouth daily. Patient takes 1.25mg  on Mon, Wed and Fri. And 2.5mg  all other days    Historical Provider, MD   BP 97/39  Pulse 59  Temp(Src) 97.8 F (36.6 C)  Resp 22  SpO2 100% Physical Exam  Nursing note and vitals reviewed.  CONSTITUTIONAL: Elderly and frail HEAD: Normocephalic/atraumatic EYES: EOMI/PERRL ENMT: Mucous membranes moist NECK: supple no meningeal signs SPINE:entire spine nontender CV: irregular, loud systolic murmur noted  LUNGS: scattered wheeze and crackles noted bilaterally  ABDOMEN: soft,  nontender, no rebound or guarding GU:no cva tenderness NEURO: Pt is awake/alert, moves all extremitiesx4 EXTREMITIES: pulses normal, full ROM, symmetric pitting edema with overlying erythema to bilateral lower extremities.  SKIN: warm, color normal PSYCH: no abnormalities of mood noted  ED Course  Procedures   DIAGNOSTIC STUDIES: Oxygen Saturation is 100% on [place] 4 L/min, normal by my interpretation.    COORDINATION OF CARE: 12:40 PM Discussed treatment plan with pt at bedside and pt agreed to plan.  3:44 PM Pt reports she is feeling worse at home, and BNP/CXR appear worse/consistent with CHF Will admit IV lasix ordered D/w dr Jannifer Franklin will admit INR checked yesterday (therapeutic) BP 96/35  Pulse 63  Temp(Src) 97.8 F (36.6 C)  Resp 22  SpO2 100%  Labs Review Labs Reviewed  PRO B  NATRIURETIC PEPTIDE - Abnormal; Notable for the following:    Pro B Natriuretic peptide (BNP) 12805.0 (*)    All other components within normal limits  I-STAT CHEM 8, ED - Abnormal; Notable for the following:    BUN 124 (*)    Creatinine, Ser 2.60 (*)    Glucose, Bld 126 (*)    Hemoglobin 10.5 (*)    HCT 31.0 (*)    All other components within normal limits  I-STAT TROPOININ, ED    Imaging Review Dg Chest Portable 1 View  12/22/2013   CLINICAL DATA:  Persistent shortness of breath with nausea and epigastric pain for 1 week  EXAM: PORTABLE CHEST - 1 VIEW  COMPARISON:  December 21, 2013  FINDINGS: Underlying emphysema is again noted. There is cardiomegaly with bibasilar interstitial edema and small effusions, stable. No new opacity. Pacemaker leads are attached to the right atrium and right ventricle. No adenopathy. Bones are osteoporotic.  IMPRESSION: Congestive heart failure superimposed on emphysematous change, stable. No new opacity.   Electronically Signed   By: Lowella Grip M.D.   On: 12/22/2013 13:19   Dg Abd Acute W/chest  12/21/2013   CLINICAL DATA:  Upper and epigastric region abdominal  pain with intermittent nausea  EXAM: ACUTE ABDOMEN SERIES (ABDOMEN 2 VIEW & CHEST 1 VIEW)  COMPARISON:  Chest radiograph December 16, 2013; CT chest, abdomen, and pelvis November 15, 2012  FINDINGS: PA chest: There is underlying emphysema. There is cardiomegaly with bibasilar interstitial edema and small effusions. No airspace consolidation. Pacemaker leads are attached to the right atrium and right ventricle.  Supine and upright abdomen: There is moderate stool throughout colon. The bowel gas pattern is unremarkable. No obstruction or free air. There are surgical clips in the right upper quadrant. There are phleboliths in the pelvis.  IMPRESSION: Underlying congestive heart failure. Bowel gas pattern unremarkable. Moderate stool throughout colon.   Electronically Signed   By: Lowella Grip M.D.   On: 12/21/2013 08:44     EKG Interpretation   Date/Time:  Friday December 22 2013 12:24:05 EDT Ventricular Rate:  70 PR Interval:    QRS Duration: 142 QT Interval:  446 QTC Calculation: 481 R Axis:   -133 Text Interpretation:  Atrial fibrillation Anterolateral infarct, age  indeterminate Left bundle branch block No significant change since last  tracing Confirmed by Christy Gentles  MD, [place] ([id number]) on 12/22/2013 12:28:54 PM      MDM   Final diagnoses:  Severe aortic stenosis  Acute on chronic diastolic congestive heart failure  Acute on chronic renal failure    Nursing notes including past medical history and social history reviewed and considered in documentation Labs/vital reviewed and considered xrays reviewed and considered   I personally performed the services described in this documentation, which was scribed in my presence. The recorded information has been reviewed and is accurate.      Sharyon Cable, MD 12/22/13 [phone]

## 2013-12-22 NOTE — H&P (Signed)
[facility] History and Physical  [name] EVO:[id number] DOB: 21-Sep-1915 DOA: 12/22/2013  Referring physician: Placido Sou, MD PCP: [name], MD   Chief Complaint: SOB  HPI: [name] is a 80 y.o. female who presented to the ED this afternoon for progressive dyspnea on exertion.  She has been here to the ED multiple times over the past week for different reasons.  Her dyspnea this time began about 1 1/2 weeks ago and has been getting worse since that time.  She states that she is unable to walk even the 20 feet it takes her to get to her bathroom.  Her dyspnea is associated with a cough, non productive.  She denies fevers/chills, N/V, abd pain, though she does feel she has some abd distension and fluid build up.  She has a history of diastolic heart failure and mod-severe aortic stenosis.  She is followed by Dr Luan Pulling (PCP).  She has been planning to move to an assisted living facility this coming week.  She has history of cirrhosis of the liver and has needed multiple therapeutic paracentesis in the past, including recently.  Cirrhosis is due to long term use of 4 gm tylenol everyday for years.  Her dyspnea is made better with rest, and in past has improved with diuresis.  She was sent to ED when her home health nurse found her O2 sats to be in the 70s.  In ED she was found to have Cr 2, BNP 12800, and CXR corroborated CHF.  She was given 40 mg IV lasix in ED and Hospitalists were called for admission.     Review of Systems:  Constitutional:  +Fatigue, No Fevers, chills.  HEENT:  No headaches, no vision changes, no dizziness  Cardio-vascular:  No chest pain, +swelling in lower extremities, +anasarca, no dizziness or palpitations  GI:  No heartburn, indigestion, abdominal pain, nausea, vomiting, diarrhea, change in bowel habits, loss of appetite  Resp:  +shortness of breath with exertion>>>rest. Mild wheezing, No excess mucus, no productive cough, no chest wall  deformity  Skin:  no rash or lesions.  GU:  no dysuria, change in color of urine, no urgency or frequency. No flank pain.  Musculoskeletal:  No joint pain or swelling. No decreased range of motion. No back pain.  Psych:  No change in mood or affect. No depression or anxiety. No memory loss.   Past Medical History  Diagnosis Date  . Sick sinus syndrome     Dual-chamber pacemaker in 11/2006  . Aortic stenosis     Moderate to severe  . Left bundle branch block   . Chronic diastolic heart failure   . Coronary atherosclerosis of native coronary artery     Cardiac catheterization 10/09: 50% left anterior descending; 80% PDA;  . Epistaxis     Mild with negative ENT evaluation  . Dizziness     Chronic  . Osteoarthritis   . Irritable bowel syndrome   . Urinary frequency   . Gastroesophageal reflux disease     Esophageal dilatation for stricture in 09/2011  . Rosacea   . Chronic lymphocytic leukemia     Stage I with anemia H./H. of 9/27 in 11/09 with normal MCV; iron deficiency in bone marrow  . Orthostatic hypotension   . Diverticulosis   . Adenomatous polyp October 2010    (Proximal Small bowel) with high grade dysplasia in polypoid lesion straddling D1/D2  . CKD (chronic kidney disease) stage 3, GFR 30-59 ml/min   .  Urethral caruncle   . Iron deficiency anemia secondary to blood loss (chronic) 01/19/2013  . History of rectal bleeding 01/19/2013  . PAF (paroxysmal atrial fibrillation)   . Mitral regurgitation     Moderate to severe   Past Surgical History  Procedure Laterality Date  . Cholecystectomy    . Cervical spine surgery    . Lumbar disc surgery    . Bladder suspension    . Abdominal hysterectomy  1972  . Cataract extraction      Bilateral  . Esophagogastroduodenoscopy  01/09/09    Rourk -noncritical Schatzki's ring, small hiatal hernia, fundal gland polyps, bile-stained gastric mucosa, polypoid lesion straddling D1 and D2 1-2 cm, localized lymphangitic-appearing  mucosa at D2 biopsied (adenomatous polyp with high grade glandular dysplasia  . A-v cardiac pacemaker insertion  11/2006    [employer]   Social History:  reports that she has never smoked. She has never used smokeless tobacco. She reports that she does not drink alcohol or use illicit drugs.  Allergies  Allergen Reactions  . Vesicare [Solifenacin] Other (See Comments)    Severe stomach pain  . Detrol [Tolterodine Tartrate] Other (See Comments)    Patient states: "I was told to never take it again, I do not recall the reaction"  . Sanctura [Trospium Chloride] Nausea Only and Other (See Comments)    Intense lower abd pain  . Sulfa Antibiotics Other (See Comments)    Extreme dizziness  . Sulfonamide Derivatives Other (See Comments)    REACTION: GI distress  . Trimethoprim     Other reaction(s): Dermatitis    Family History  Problem Relation Age of Onset  . Heart failure Father   . Cancer Father   . Diabetes Mother      Prior to Admission medications   Medication Sig Start Date End Date Taking? Authorizing Provider  aspirin EC 81 MG EC tablet Take 1 tablet (81 mg total) by mouth daily. 11/14/13  Yes [name], MD  HYDROcodone-acetaminophen (NORCO/VICODIN) 5-325 MG per tablet Take 1 tablet by mouth every 4 (four) hours as needed for severe pain.   Yes Historical Provider, MD  losartan (COZAAR) 100 MG tablet Take 100 mg by mouth daily.   Yes Historical Provider, MD  metoprolol tartrate (LOPRESSOR) 25 MG tablet Take 25 mg by mouth 2 (two) times daily.   Yes Historical Provider, MD  Multiple Vitamins-Minerals (PRESERVISION AREDS 2) CAPS Take 1 capsule by mouth 2 (two) times daily.   Yes Historical Provider, MD  nitroGLYCERIN (NITROSTAT) 0.4 MG SL tablet Place 1 tablet (0.4 mg total) under the tongue every 5 (five) minutes as needed for chest pain. 12/14/12  Yes Herminio Commons, MD  pravastatin (PRAVACHOL) 40 MG tablet Take 40 mg by mouth at bedtime.   Yes Historical Provider, MD    torsemide (DEMADEX) 20 MG tablet Take 1 tablet (20 mg total) by mouth 2 (two) times daily. Take 1 tablet daily 12/21/13  Yes Merryl Hacker, MD  verapamil (CALAN-SR) 180 MG CR tablet Take 180 mg by mouth at bedtime.   Yes Historical Provider, MD  warfarin (COUMADIN) 2.5 MG tablet Take 1.25-2.5 mg by mouth daily. Patient takes 1.25mg  on Mon, Wed and Fri. And 2.5mg  all other days   Yes Historical Provider, MD   Physical Exam: Filed Vitals:   12/22/13 1335 12/22/13 1430 12/22/13 1700 12/22/13 1728  BP:  96/35 90/40   Pulse:  63 64   Temp:   98 F (36.7 C)  Resp:   16   Height:   5' (1.524 m) 5' (1.524 m)  Weight:   65.772 kg (145 lb)   SpO2: 2% 100% 92%     Wt Readings from Last 3 Encounters:  12/22/13 65.772 kg (145 lb)  12/16/13 65.772 kg (145 lb)  11/14/13 66.725 kg (147 lb 1.6 oz)    General:  Appears calm, looks mildly uncomfortable Eyes: PERRL, normal lids, irises & conjunctiva ENT: decreased hearing at baseline, MMM Neck: no LAD, masses or thyromegaly Cardiovascular: irregular rhythm, regular rate, III/VI systolic ejection murmur without r/g. 2+ LE edema with stasis skin changes Telemetry: Afib, controlled rate  Respiratory: BL rales to mid lung fields, mildly increased respiratory effort. Abdomen: soft, nt, + distension Skin: no rash or induration seen on limited exam Musculoskeletal: grossly normal tone BUE/BLE Psychiatric: grossly normal mood and affect, speech fluent and appropriate Neurologic: grossly non-focal.          Labs on Admission:  Basic Metabolic Panel:  Recent Labs Lab 12/16/13 1606 12/21/13 0729 12/22/13 1319  NA 142 143 137  K 4.7 4.2 4.8  CL 98 100 102  CO2 29 29  --   GLUCOSE 156* 130* 126*  BUN 114* 118* 124*  CREATININE 2.31* 2.28* 2.60*  CALCIUM 9.0 9.2  --    Liver Function Tests:  Recent Labs Lab 12/16/13 1606 12/21/13 0729  AST 17 16  ALT 9 10  ALKPHOS 111 122*  BILITOT 0.5 0.4  PROT 6.5 6.6  ALBUMIN 3.3* 3.4*     Recent Labs Lab 12/21/13 0729  LIPASE 56   No results found for this basename: AMMONIA,  in the last 168 hours CBC:  Recent Labs Lab 12/16/13 1606 12/21/13 0729 12/22/13 1319 12/22/13 1741  WBC 8.6 9.7  --  12.4*  NEUTROABS 6.0 7.2  --  10.3*  HGB 8.1* 8.3* 10.5* PENDING  HCT 25.9* 25.7* 31.0* 22.9*  MCV 100.8* 100.8*  --  100.9*  PLT 198 195  --  152   Cardiac Enzymes:  Recent Labs Lab 12/21/13 0729  TROPONINI <0.30    BNP (last 3 results)  Recent Labs  12/16/13 1606 12/21/13 0729 12/22/13 1355  PROBNP 9112.0* 11458.0* 12805.0*   CBG: No results found for this basename: GLUCAP,  in the last 168 hours  Radiological Exams on Admission: Dg Chest Portable 1 View  12/22/2013   CLINICAL DATA:  Persistent shortness of breath with nausea and epigastric pain for 1 week  EXAM: PORTABLE CHEST - 1 VIEW  COMPARISON:  December 21, 2013  FINDINGS: Underlying emphysema is again noted. There is cardiomegaly with bibasilar interstitial edema and small effusions, stable. No new opacity. Pacemaker leads are attached to the right atrium and right ventricle. No adenopathy. Bones are osteoporotic.  IMPRESSION: Congestive heart failure superimposed on emphysematous change, stable. No new opacity.   Electronically Signed   By: Lowella Grip M.D.   On: 12/22/2013 13:19   Dg Abd Acute W/chest  12/21/2013   CLINICAL DATA:  Upper and epigastric region abdominal pain with intermittent nausea  EXAM: ACUTE ABDOMEN SERIES (ABDOMEN 2 VIEW & CHEST 1 VIEW)  COMPARISON:  Chest radiograph December 16, 2013; CT chest, abdomen, and pelvis November 15, 2012  FINDINGS: PA chest: There is underlying emphysema. There is cardiomegaly with bibasilar interstitial edema and small effusions. No airspace consolidation. Pacemaker leads are attached to the right atrium and right ventricle.  Supine and upright abdomen: There is moderate stool throughout colon. The bowel gas  pattern is unremarkable. No obstruction or  free air. There are surgical clips in the right upper quadrant. There are phleboliths in the pelvis.  IMPRESSION: Underlying congestive heart failure. Bowel gas pattern unremarkable. Moderate stool throughout colon.   Electronically Signed   By: Lowella Grip M.D.   On: 12/21/2013 08:44    EKG: Independently reviewed. Afib, controlled rate, old anterior infact  Assessment/Plan Principal Problem:   Acute on chronic diastolic congestive heart failure Active Problems:   Aortic stenosis   Paroxysmal atrial fibrillation   Hepatic cirrhosis   Acute on chronic renal failure   1. Acute on chronic diastolic congestive heart failure with exacerbation - pt significantly SOB with DOE.  BNP elevated with CXR findings consistent with pulmonary edema.  Lasix 40 mg IV given in ED.     - Admit to obs status for now, telemetry   - continue IV diuresis, will decrease lasix dose to 20 mg IV daily given aortic stenosis 2. Acute on chronic renal failure - likely 2/2 HF exacerbation, AS, and renal venous congestion 2/2 ascites.     - Gentle diuresis as above with daily monitoring of Cr   - strict I/Os, avoid nephrotoxic meds   - possible combined hepato-renal and cardio-renal syndromes 3. Hepatic Cirrhosis - likely cause of increased ascites and a factor in Acute renal failure   - avoid tylenol and other hepatotoxic meds   - no acute need for paracentesis 4. Paroxysmal Afib - on rate controlling meds and coumadin.  INR at goal.     - monitor INR   - continue appropriate rate controlling meds and coumadin 5. Aortic Stenosis - will likely limit aggressive diuresis   Code Status: DNR DVT Prophylaxis: Systemic anticoagulation with warfarin Family Communication: Son Disposition Plan: Home  Time spent: 3 min  Lance Coon [facility] Pager [phone]

## 2013-12-23 DIAGNOSIS — Z66 Do not resuscitate: Secondary | ICD-10-CM | POA: Diagnosis present

## 2013-12-23 DIAGNOSIS — M1990 Unspecified osteoarthritis, unspecified site: Secondary | ICD-10-CM | POA: Diagnosis present

## 2013-12-23 DIAGNOSIS — K589 Irritable bowel syndrome without diarrhea: Secondary | ICD-10-CM | POA: Diagnosis present

## 2013-12-23 DIAGNOSIS — K219 Gastro-esophageal reflux disease without esophagitis: Secondary | ICD-10-CM | POA: Diagnosis present

## 2013-12-23 DIAGNOSIS — Z833 Family history of diabetes mellitus: Secondary | ICD-10-CM | POA: Diagnosis not present

## 2013-12-23 DIAGNOSIS — I350 Nonrheumatic aortic (valve) stenosis: Secondary | ICD-10-CM | POA: Diagnosis present

## 2013-12-23 DIAGNOSIS — Z950 Presence of cardiac pacemaker: Secondary | ICD-10-CM | POA: Diagnosis not present

## 2013-12-23 DIAGNOSIS — Z809 Family history of malignant neoplasm, unspecified: Secondary | ICD-10-CM | POA: Diagnosis not present

## 2013-12-23 DIAGNOSIS — F419 Anxiety disorder, unspecified: Secondary | ICD-10-CM | POA: Diagnosis present

## 2013-12-23 DIAGNOSIS — N183 Chronic kidney disease, stage 3 (moderate): Secondary | ICD-10-CM | POA: Diagnosis present

## 2013-12-23 DIAGNOSIS — I472 Ventricular tachycardia: Secondary | ICD-10-CM | POA: Diagnosis present

## 2013-12-23 DIAGNOSIS — Z515 Encounter for palliative care: Secondary | ICD-10-CM | POA: Diagnosis not present

## 2013-12-23 DIAGNOSIS — Z8249 Family history of ischemic heart disease and other diseases of the circulatory system: Secondary | ICD-10-CM | POA: Diagnosis not present

## 2013-12-23 DIAGNOSIS — K7460 Unspecified cirrhosis of liver: Secondary | ICD-10-CM | POA: Diagnosis present

## 2013-12-23 DIAGNOSIS — I2510 Atherosclerotic heart disease of native coronary artery without angina pectoris: Secondary | ICD-10-CM | POA: Diagnosis present

## 2013-12-23 DIAGNOSIS — Z7982 Long term (current) use of aspirin: Secondary | ICD-10-CM | POA: Diagnosis not present

## 2013-12-23 DIAGNOSIS — Z856 Personal history of leukemia: Secondary | ICD-10-CM | POA: Diagnosis not present

## 2013-12-23 DIAGNOSIS — I252 Old myocardial infarction: Secondary | ICD-10-CM | POA: Diagnosis not present

## 2013-12-23 DIAGNOSIS — L89151 Pressure ulcer of sacral region, stage 1: Secondary | ICD-10-CM | POA: Diagnosis present

## 2013-12-23 DIAGNOSIS — Z90710 Acquired absence of both cervix and uterus: Secondary | ICD-10-CM | POA: Diagnosis not present

## 2013-12-23 DIAGNOSIS — N179 Acute kidney failure, unspecified: Secondary | ICD-10-CM | POA: Diagnosis present

## 2013-12-23 DIAGNOSIS — R188 Other ascites: Secondary | ICD-10-CM | POA: Diagnosis present

## 2013-12-23 DIAGNOSIS — I480 Paroxysmal atrial fibrillation: Secondary | ICD-10-CM | POA: Diagnosis present

## 2013-12-23 DIAGNOSIS — Z7901 Long term (current) use of anticoagulants: Secondary | ICD-10-CM | POA: Diagnosis not present

## 2013-12-23 DIAGNOSIS — I5033 Acute on chronic diastolic (congestive) heart failure: Secondary | ICD-10-CM | POA: Diagnosis present

## 2013-12-23 DIAGNOSIS — I129 Hypertensive chronic kidney disease with stage 1 through stage 4 chronic kidney disease, or unspecified chronic kidney disease: Secondary | ICD-10-CM | POA: Diagnosis present

## 2013-12-23 DIAGNOSIS — J449 Chronic obstructive pulmonary disease, unspecified: Secondary | ICD-10-CM | POA: Diagnosis present

## 2013-12-23 DIAGNOSIS — R0602 Shortness of breath: Secondary | ICD-10-CM | POA: Diagnosis present

## 2013-12-23 DIAGNOSIS — D649 Anemia, unspecified: Secondary | ICD-10-CM | POA: Diagnosis present

## 2013-12-23 LAB — CBC WITH DIFFERENTIAL/PLATELET
Basophils Absolute: 0.1 10*3/uL (ref 0.0–0.1)
Basophils Relative: 1 % (ref 0–1)
EOS PCT: 1 % (ref 0–5)
Eosinophils Absolute: 0.1 10*3/uL (ref 0.0–0.7)
HCT: 22.2 % — ABNORMAL LOW (ref 36.0–46.0)
Hemoglobin: 6.9 g/dL — CL (ref 12.0–15.0)
LYMPHS ABS: 2 10*3/uL (ref 0.7–4.0)
Lymphocytes Relative: 19 % (ref 12–46)
MCH: 32.2 pg (ref 26.0–34.0)
MCHC: 31.1 g/dL (ref 30.0–36.0)
MCV: 103.7 fL — ABNORMAL HIGH (ref 78.0–100.0)
MONOS PCT: 7 % (ref 3–12)
Monocytes Absolute: 0.7 10*3/uL (ref 0.1–1.0)
NEUTROS PCT: 72 % (ref 43–77)
Neutro Abs: 7.7 10*3/uL (ref 1.7–7.7)
PLATELETS: 204 10*3/uL (ref 150–400)
RBC: 2.14 MIL/uL — AB (ref 3.87–5.11)
RDW: 14.1 % (ref 11.5–15.5)
WBC: 10.6 10*3/uL — AB (ref 4.0–10.5)

## 2013-12-23 LAB — COMPREHENSIVE METABOLIC PANEL
ALT: 9 U/L (ref 0–35)
AST: 12 U/L (ref 0–37)
Albumin: 2.7 g/dL — ABNORMAL LOW (ref 3.5–5.2)
Alkaline Phosphatase: 106 U/L (ref 39–117)
Anion gap: 14 (ref 5–15)
BUN: 125 mg/dL — ABNORMAL HIGH (ref 6–23)
CALCIUM: 8.6 mg/dL (ref 8.4–10.5)
CO2: 26 mEq/L (ref 19–32)
Chloride: 99 mEq/L (ref 96–112)
Creatinine, Ser: 2.82 mg/dL — ABNORMAL HIGH (ref 0.50–1.10)
GFR calc non Af Amer: 13 mL/min — ABNORMAL LOW (ref >90)
GFR, EST AFRICAN AMERICAN: 15 mL/min — AB (ref >90)
GLUCOSE: 131 mg/dL — AB (ref 70–99)
Potassium: 4.8 mEq/L (ref 3.7–5.3)
Sodium: 139 mEq/L (ref 137–147)
TOTAL PROTEIN: 5.8 g/dL — AB (ref 6.0–8.3)
Total Bilirubin: 0.4 mg/dL (ref 0.3–1.2)

## 2013-12-23 LAB — HEMOGLOBIN AND HEMATOCRIT, BLOOD
HEMATOCRIT: 28.3 % — AB (ref 36.0–46.0)
Hemoglobin: 9.2 g/dL — ABNORMAL LOW (ref 12.0–15.0)

## 2013-12-23 LAB — BASIC METABOLIC PANEL
Anion gap: 12 (ref 5–15)
BUN: 122 mg/dL — AB (ref 6–23)
CALCIUM: 8.6 mg/dL (ref 8.4–10.5)
CO2: 28 mEq/L (ref 19–32)
Chloride: 99 mEq/L (ref 96–112)
Creatinine, Ser: 2.75 mg/dL — ABNORMAL HIGH (ref 0.50–1.10)
GFR calc Af Amer: 16 mL/min — ABNORMAL LOW (ref >90)
GFR, EST NON AFRICAN AMERICAN: 14 mL/min — AB (ref >90)
GLUCOSE: 124 mg/dL — AB (ref 70–99)
Potassium: 4.9 mEq/L (ref 3.7–5.3)
Sodium: 139 mEq/L (ref 137–147)

## 2013-12-23 LAB — PROTIME-INR
INR: 2.85 — ABNORMAL HIGH (ref 0.00–1.49)
Prothrombin Time: 29.9 seconds — ABNORMAL HIGH (ref 11.6–15.2)

## 2013-12-23 LAB — PREPARE RBC (CROSSMATCH)

## 2013-12-23 LAB — ABO/RH: ABO/RH(D): O NEG

## 2013-12-23 MED ORDER — MORPHINE SULFATE 2 MG/ML IJ SOLN
1.0000 mg | INTRAMUSCULAR | Status: DC | PRN
  Administered 2013-12-26: 1 mg via INTRAVENOUS
  Filled 2013-12-23: qty 1

## 2013-12-23 MED ORDER — CETYLPYRIDINIUM CHLORIDE 0.05 % MT LIQD
7.0000 mL | Freq: Two times a day (BID) | OROMUCOSAL | Status: DC
  Administered 2013-12-23 – 2013-12-26 (×7): 7 mL via OROMUCOSAL

## 2013-12-23 MED ORDER — FUROSEMIDE 10 MG/ML IJ SOLN
20.0000 mg | Freq: Once | INTRAMUSCULAR | Status: AC
  Administered 2013-12-23: 20 mg via INTRAVENOUS
  Filled 2013-12-23: qty 2

## 2013-12-23 MED ORDER — ALPRAZOLAM 0.25 MG PO TABS
0.2500 mg | ORAL_TABLET | Freq: Two times a day (BID) | ORAL | Status: DC | PRN
  Administered 2013-12-23: 0.25 mg via ORAL
  Filled 2013-12-23: qty 1

## 2013-12-23 MED ORDER — SODIUM CHLORIDE 0.9 % IV SOLN: Freq: Once | INTRAVENOUS | Status: AC

## 2013-12-23 MED ORDER — PRO-STAT SUGAR FREE PO LIQD
30.0000 mL | Freq: Three times a day (TID) | ORAL | Status: DC
  Administered 2013-12-23 – 2013-12-26 (×7): 30 mL via ORAL
  Filled 2013-12-23 (×3): qty 30

## 2013-12-23 MED ORDER — ACETAMINOPHEN 325 MG PO TABS
650.0000 mg | ORAL_TABLET | Freq: Once | ORAL | Status: AC
  Administered 2013-12-23: 650 mg via ORAL
  Filled 2013-12-23: qty 2

## 2013-12-23 MED ORDER — ALPRAZOLAM 0.25 MG PO TABS
0.2500 mg | ORAL_TABLET | ORAL | Status: DC | PRN
  Administered 2013-12-23 – 2013-12-26 (×7): 0.25 mg via ORAL
  Filled 2013-12-23 (×7): qty 1

## 2013-12-23 MED ORDER — DIPHENHYDRAMINE HCL 25 MG PO CAPS
25.0000 mg | ORAL_CAPSULE | Freq: Once | ORAL | Status: AC
  Administered 2013-12-23: 25 mg via ORAL
  Filled 2013-12-23: qty 1

## 2013-12-23 NOTE — Progress Notes (Signed)
UR completed 

## 2013-12-23 NOTE — Progress Notes (Signed)
Late entryCRITICAL VALUE ALERT  Critical value received:  Hgb 6.9  Date of notification:  12/23/13   Time of notification:  0800  Critical value read back:Yes.    Nurse who received alert:  Sharen Hones  MD notified (1st page):  Dr. Everette Rank  Time of first page:  0815 approx.  MD notified (2nd page): Luan Pulling on the unit to see patient  Time of second page:  Responding MD:  Luan Pulling  Time MD responded:  0830 Approx.  New orders given and followed.

## 2013-12-23 NOTE — Progress Notes (Addendum)
INITIAL NUTRITION ASSESSMENT  DOCUMENTATION CODES Per approved criteria  -Not Applicable   INTERVENTION: 30 ml Prostat TID  NUTRITION DIAGNOSIS: Increased nutrient needs related to increased protein needs due to wound healing as evidenced by pt with chronic pressure ulcer.   Goal: Pt will meet >90% of estimated nutritional needs  Monitor:  PO/supplement intake, labs, weight changes, I/O's  Reason for Assessment: MST=2  80 y.o. female  Admitting Dx: Acute on chronic diastolic congestive heart failure  [name] is a 80 y.o. female who presented to the ED this afternoon for progressive dyspnea on exertion. She does feel she has some abd distension and fluid build up. She has a history of diastolic heart failure and mod-severe aortic stenosis. She is followed by Dr Luan Pulling (PCP). She has been planning to move to an assisted living facility this coming week. She has history of cirrhosis of the liver and has needed multiple therapeutic paracentesis in the past, including recently. Cirrhosis is due to long term use of 4 gm tylenol everyday for years. Her dyspnea is made better with rest, and in past has improved with diuresis. She was sent to ED when her home health nurse found her O2 sats to be in the 70s.She was given 40 mg IV lasix in ED.   ASSESSMENT: Pt admitted with CHF.  Pt reports wt loss on nutrition screen, however, this is not consistent with wt hx. Wt has been stable over the past year. UBW: 145#. Anticipate some wt fluctutations during hospitalization due to diuresis. Pt has increased nutrition needs due to stage II pressure ulcer on sacrum. Will add Prostat TID for additional nutrition support. No intake data currently available. Nutrition focused physical exam deferred at this time. Labs reviewed. BUN/Creat:122/ 2.45. Glucose: 124.  Height: Ht Readings from Last 1 Encounters:  12/22/13 5' (1.524 m)    Weight: Wt Readings from Last 1 Encounters:  12/23/13 145 lb  11.6 oz (66.1 kg)    Ideal Body Weight: 100#  % Ideal Body Weight: 145%  Wt Readings from Last 10 Encounters:  12/23/13 145 lb 11.6 oz (66.1 kg)  12/16/13 145 lb (65.772 kg)  11/14/13 147 lb 1.6 oz (66.725 kg)  10/30/13 145 lb 11.2 oz (66.089 kg)  10/16/13 146 lb (66.225 kg)  07/27/13 145 lb 1.6 oz (65.817 kg)  05/05/13 150 lb (68.04 kg)  03/20/13 146 lb (66.225 kg)  02/13/13 143 lb 9.6 oz (65.137 kg)  01/19/13 144 lb (65.318 kg)    Usual Body Weight: 145#  % Usual Body Weight: 100%  BMI:  Body mass index is 28.46 kg/(m^2). Overweight  Estimated Nutritional Needs: Kcal: 1900-2100 Protein: 86-96 grams Fluid: 1.9-2.1 L  Skin: stage II pressure ulcer on sacrum  Diet Order: Cardiac  EDUCATION NEEDS: -No education needs identified at this time  No intake or output data in the 24 hours ending 12/23/13 0937  Last BM: 12/21/13   Labs:   Recent Labs Lab 12/16/13 1606 12/21/13 0729 12/22/13 1319 12/23/13 0658  NA 142 143 137 139  K 4.7 4.2 4.8 4.9  CL 98 100 102 99  CO2 29 29  --  28  BUN 114* 118* 124* 122*  CREATININE 2.31* 2.28* 2.60* 2.75*  CALCIUM 9.0 9.2  --  8.6  GLUCOSE 156* 130* 126* 124*    CBG (last 3)  No results found for this basename: GLUCAP,  in the last 72 hours  Scheduled Meds: . antiseptic oral rinse  7 mL Mouth Rinse BID  .  aspirin EC  81 mg Oral Daily  . furosemide  20 mg Intravenous Daily  . losartan  100 mg Oral Daily  . metoprolol tartrate  25 mg Oral BID  . pravastatin  40 mg Oral QHS  . sodium chloride  3 mL Intravenous Q12H  . verapamil  180 mg Oral QHS  . warfarin  1.25 mg Oral Q M,W,F-1800  . warfarin  2.5 mg Oral Once per day on Sun Tue Thu Sat  . [START ON 12/26/2013] Warfarin - Physician Dosing Inpatient   Does not apply q1800    Continuous Infusions:   Past Medical History  Diagnosis Date  . Sick sinus syndrome     Dual-chamber pacemaker in 11/2006  . Aortic stenosis     Moderate to severe  . Left bundle branch  block   . Chronic diastolic heart failure   . Coronary atherosclerosis of native coronary artery     Cardiac catheterization 10/09: 50% left anterior descending; 80% PDA;  . Epistaxis     Mild with negative ENT evaluation  . Dizziness     Chronic  . Osteoarthritis   . Irritable bowel syndrome   . Urinary frequency   . Gastroesophageal reflux disease     Esophageal dilatation for stricture in 09/2011  . Rosacea   . Chronic lymphocytic leukemia     Stage I with anemia H./H. of 9/27 in 11/09 with normal MCV; iron deficiency in bone marrow  . Orthostatic hypotension   . Diverticulosis   . Adenomatous polyp October 2010    (Proximal Small bowel) with high grade dysplasia in polypoid lesion straddling D1/D2  . CKD (chronic kidney disease) stage 3, GFR 30-59 ml/min   . Urethral caruncle   . Iron deficiency anemia secondary to blood loss (chronic) 01/19/2013  . History of rectal bleeding 01/19/2013  . PAF (paroxysmal atrial fibrillation)   . Mitral regurgitation     Moderate to severe    Past Surgical History  Procedure Laterality Date  . Cholecystectomy    . Cervical spine surgery    . Lumbar disc surgery    . Bladder suspension    . Abdominal hysterectomy  1972  . Cataract extraction      Bilateral  . Esophagogastroduodenoscopy  01/09/09    Rourk -noncritical Schatzki's ring, small hiatal hernia, fundal gland polyps, bile-stained gastric mucosa, polypoid lesion straddling D1 and D2 1-2 cm, localized lymphangitic-appearing mucosa at D2 biopsied (adenomatous polyp with high grade glandular dysplasia  . A-v cardiac pacemaker insertion  11/2006    [employer]    Jasiah Buntin A. Jimmye Norman, RD, LDN Pager: [phone]

## 2013-12-23 NOTE — Progress Notes (Signed)
Subjective: She was admitted with multiple problems. She has congestive heart failure, chronic GI bleeding, chronic urethral bleeding, COPD, cirrhosis of the liver which is a fairly new diagnosis. She said she felt really bad last night and felt like she was "going to die". She has no other new complaints. She still feels very weak very short of breath and very anxious  Objective: Vital signs in last 24 hours: Temp:  [97.8 F (36.6 C)-98.7 F (37.1 C)] 98.7 F (37.1 C) (10/10 0636) Pulse Rate:  [59-72] 71 (10/10 0636) Resp:  [16-25] 18 (10/10 0636) BP: (85-105)/(25-40) 85/25 mmHg (10/10 0636) SpO2:  [2 %-100 %] 91 % (10/10 0636) Weight:  [65.772 kg (145 lb)-66.1 kg (145 lb 11.6 oz)] 66.1 kg (145 lb 11.6 oz) (10/10 0636) Weight change:  Last BM Date: 12/21/13  Intake/Output from previous day:    PHYSICAL EXAM General appearance: alert, cooperative and Anxious and hard of hearing Resp: She has bilateral rales Cardio: Irregularly irregular without gallop GI: I think he has some ascitic fluid Extremities: She has edema bilaterally  Lab Results:  Results for orders placed during the hospital encounter of 12/22/13 (from the past 48 hour(s))  I-STAT CHEM 8, ED     Status: Abnormal   Collection Time    12/22/13  1:19 PM      Result Value Ref Range   Sodium 137  137 - 147 mEq/L   Potassium 4.8  3.7 - 5.3 mEq/L   Chloride 102  96 - 112 mEq/L   BUN 124 (*) 6 - 23 mg/dL   Creatinine, Ser 2.60 (*) 0.50 - 1.10 mg/dL   Glucose, Bld 126 (*) 70 - 99 mg/dL   Calcium, Ion 1.15  1.13 - 1.30 mmol/L   TCO2 28  0 - 100 mmol/L   Hemoglobin 10.5 (*) 12.0 - 15.0 g/dL   HCT 31.0 (*) 36.0 - 46.0 %  I-STAT TROPOININ, ED     Status: None   Collection Time    12/22/13  1:19 PM      Result Value Ref Range   Troponin i, poc 0.02  0.00 - 0.08 ng/mL   Comment 3            Comment: Due to the release kinetics of cTnI,     a negative result within the first hours     of the onset of symptoms does not  rule out     myocardial infarction with certainty.     If myocardial infarction is still suspected,     repeat the test at appropriate intervals.  PRO B NATRIURETIC PEPTIDE     Status: Abnormal   Collection Time    12/22/13  1:55 PM      Result Value Ref Range   Pro B Natriuretic peptide (BNP) 12805.0 (*) 0 - 450 pg/mL  PROTIME-INR     Status: Abnormal   Collection Time    12/22/13  3:43 PM      Result Value Ref Range   Prothrombin Time 29.3 (*) 11.6 - 15.2 seconds   INR 2.77 (*) 0.00 - 1.49  CBC WITH DIFFERENTIAL     Status: Abnormal   Collection Time    12/22/13  5:41 PM      Result Value Ref Range   WBC 12.4 (*) 4.0 - 10.5 K/uL   RBC 2.27 (*) 3.87 - 5.11 MIL/uL   Hemoglobin 7.1 (*) 12.0 - 15.0 g/dL   Comment: DELTA CHECK NOTED   HCT 22.9 (*)  36.0 - 46.0 %   MCV 100.9 (*) 78.0 - 100.0 fL   MCH 31.3  26.0 - 34.0 pg   MCHC 31.0  30.0 - 36.0 g/dL   RDW 14.7  11.5 - 15.5 %   Platelets 152  150 - 400 K/uL   Neutrophils Relative % 83 (*) 43 - 77 %   Neutro Abs 10.3 (*) 1.7 - 7.7 K/uL   Lymphocytes Relative 11 (*) 12 - 46 %   Lymphs Abs 1.4  0.7 - 4.0 K/uL   Monocytes Relative 5  3 - 12 %   Monocytes Absolute 0.6  0.1 - 1.0 K/uL   Eosinophils Relative 0  0 - 5 %   Eosinophils Absolute 0.0  0.0 - 0.7 K/uL   Basophils Relative 0  0 - 1 %   Basophils Absolute 0.0  0.0 - 0.1 K/uL  MRSA PCR SCREENING     Status: None   Collection Time    12/22/13  6:10 PM      Result Value Ref Range   MRSA by PCR NEGATIVE  NEGATIVE   Comment:            The GeneXpert MRSA Assay (FDA     approved for NASAL specimens     only), is one component of a     comprehensive MRSA colonization     surveillance program. It is not     intended to diagnose MRSA     infection nor to guide or     monitor treatment for     MRSA infections.  PROTIME-INR     Status: Abnormal   Collection Time    12/23/13  6:58 AM      Result Value Ref Range   Prothrombin Time 29.9 (*) 11.6 - 15.2 seconds   INR 2.85 (*)  0.00 - 7.09  BASIC METABOLIC PANEL     Status: Abnormal   Collection Time    12/23/13  6:58 AM      Result Value Ref Range   Sodium 139  137 - 147 mEq/L   Potassium 4.9  3.7 - 5.3 mEq/L   Chloride 99  96 - 112 mEq/L   CO2 28  19 - 32 mEq/L   Glucose, Bld 124 (*) 70 - 99 mg/dL   BUN 122 (*) 6 - 23 mg/dL   Creatinine, Ser 2.75 (*) 0.50 - 1.10 mg/dL   Calcium 8.6  8.4 - 10.5 mg/dL   GFR calc non Af Amer 14 (*) >90 mL/min   GFR calc Af Amer 16 (*) >90 mL/min   Comment: (NOTE)     The eGFR has been calculated using the CKD EPI equation.     This calculation has not been validated in all clinical situations.     eGFR's persistently <90 mL/min signify possible Chronic Kidney     Disease.   Anion gap 12  5 - 15  CBC WITH DIFFERENTIAL     Status: Abnormal   Collection Time    12/23/13  6:58 AM      Result Value Ref Range   WBC 10.6 (*) 4.0 - 10.5 K/uL   RBC 2.14 (*) 3.87 - 5.11 MIL/uL   Hemoglobin 6.9 (*) 12.0 - 15.0 g/dL   Comment: CRITICAL RESULT CALLED TO, READ BACK BY AND VERIFIED WITH:     WATKINS T. AT [id number] ON [id number] BY THOMPSON S.   HCT 22.2 (*) 36.0 - 46.0 %   MCV 103.7 (*) 78.0 -  100.0 fL   MCH 32.2  26.0 - 34.0 pg   MCHC 31.1  30.0 - 36.0 g/dL   RDW 14.1  11.5 - 15.5 %   Platelets 204  150 - 400 K/uL   Comment: DELTA CHECK NOTED     RESULT REPEATED AND VERIFIED   Neutrophils Relative % 72  43 - 77 %   Lymphocytes Relative 19  12 - 46 %   Monocytes Relative 7  3 - 12 %   Eosinophils Relative 1  0 - 5 %   Basophils Relative 1  0 - 1 %   Neutro Abs 7.7  1.7 - 7.7 K/uL   Lymphs Abs 2.0  0.7 - 4.0 K/uL   Monocytes Absolute 0.7  0.1 - 1.0 K/uL   Eosinophils Absolute 0.1  0.0 - 0.7 K/uL   Basophils Absolute 0.1  0.0 - 0.1 K/uL   RBC Morphology BASOPHILIC STIPPLING     WBC Morphology ATYPICAL LYMPHOCYTES      ABGS  Recent Labs  12/22/13 1319  TCO2 28   CULTURES Recent Results (from the past 240 hour(s))  MRSA PCR SCREENING     Status: None   Collection Time     12/22/13  6:10 PM      Result Value Ref Range Status   MRSA by PCR NEGATIVE  NEGATIVE Final   Comment:            The GeneXpert MRSA Assay (FDA     approved for NASAL specimens     only), is one component of a     comprehensive MRSA colonization     surveillance program. It is not     intended to diagnose MRSA     infection nor to guide or     monitor treatment for     MRSA infections.   Studies/Results: Dg Chest Portable 1 View  12/22/2013   CLINICAL DATA:  Persistent shortness of breath with nausea and epigastric pain for 1 week  EXAM: PORTABLE CHEST - 1 VIEW  COMPARISON:  December 21, 2013  FINDINGS: Underlying emphysema is again noted. There is cardiomegaly with bibasilar interstitial edema and small effusions, stable. No new opacity. Pacemaker leads are attached to the right atrium and right ventricle. No adenopathy. Bones are osteoporotic.  IMPRESSION: Congestive heart failure superimposed on emphysematous change, stable. No new opacity.   Electronically Signed   By: Lowella Grip M.D.   On: 12/22/2013 13:19    Medications:  Prior to Admission:  Prescriptions prior to admission  Medication Sig Dispense Refill  . aspirin EC 81 MG EC tablet Take 1 tablet (81 mg total) by mouth daily.  30 tablet  12  . HYDROcodone-acetaminophen (NORCO/VICODIN) 5-325 MG per tablet Take 1 tablet by mouth every 4 (four) hours as needed for severe pain.      [name] losartan (COZAAR) 100 MG tablet Take 100 mg by mouth daily.      . metoprolol tartrate (LOPRESSOR) 25 MG tablet Take 25 mg by mouth 2 (two) times daily.      . Multiple Vitamins-Minerals (PRESERVISION AREDS 2) CAPS Take 1 capsule by mouth 2 (two) times daily.      . nitroGLYCERIN (NITROSTAT) 0.4 MG SL tablet Place 1 tablet (0.4 mg total) under the tongue every 5 (five) minutes as needed for chest pain.  24 tablet  4  . pravastatin (PRAVACHOL) 40 MG tablet Take 40 mg by mouth at bedtime.      . torsemide (DEMADEX)  20 MG tablet Take 1 tablet (20 mg  total) by mouth 2 (two) times daily. Take 1 tablet daily  60 tablet  0  . verapamil (CALAN-SR) 180 MG CR tablet Take 180 mg by mouth at bedtime.      [name] warfarin (COUMADIN) 2.5 MG tablet Take 1.25-2.5 mg by mouth daily. Patient takes 1.49m on Mon, Wed and Fri. And 2.52mall other days       Scheduled: . sodium chloride   Intravenous Once  . acetaminophen  650 mg Oral Once  . antiseptic oral rinse  7 mL Mouth Rinse BID  . aspirin EC  81 mg Oral Daily  . diphenhydrAMINE  25 mg Oral Once  . feeding supplement (PRO-STAT SUGAR FREE 64)  30 mL Oral TID WC  . furosemide  20 mg Intravenous Daily  . furosemide  20 mg Intravenous Once  . metoprolol tartrate  25 mg Oral BID  . pravastatin  40 mg Oral QHS  . sodium chloride  3 mL Intravenous Q12H  . verapamil  180 mg Oral QHS   Continuous:  PRZDG:UYQIHKhloride, ALPRAZolam, morphine injection, nitroGLYCERIN, ondansetron (ZOFRAN) IV, oxyCODONE, sodium chloride  Assesment: She is admitted with acute on chronic diastolic heart failure. She has severe aortic stenosis. She seems to be in atrial fibrillation now. She has acute on chronic renal failure. She has anemia which is worse. Principal Problem:   Acute on chronic diastolic congestive heart failure Active Problems:   Aortic stenosis   Paroxysmal atrial fibrillation   Hepatic cirrhosis   Acute on chronic renal failure    Plan: She's going to have a blood transfusion. I had a frank discussion with her about my assessment of her situation which is then she is in the process of dying. I told her we should shift our goals 2 keeping her comfortable and I will continue to try to make her better. She understands.    LOS: 1 day   Yachet Mattson L 12/23/2013, 10:40 AM

## 2013-12-24 LAB — CBC WITH DIFFERENTIAL/PLATELET
BASOS ABS: 0 10*3/uL (ref 0.0–0.1)
Basophils Relative: 0 % (ref 0–1)
Eosinophils Absolute: 0.3 10*3/uL (ref 0.0–0.7)
Eosinophils Relative: 3 % (ref 0–5)
HCT: 28.6 % — ABNORMAL LOW (ref 36.0–46.0)
Hemoglobin: 9.4 g/dL — ABNORMAL LOW (ref 12.0–15.0)
LYMPHS ABS: 2.5 10*3/uL (ref 0.7–4.0)
Lymphocytes Relative: 26 % (ref 12–46)
MCH: 31.4 pg (ref 26.0–34.0)
MCHC: 32.9 g/dL (ref 30.0–36.0)
MCV: 95.7 fL (ref 78.0–100.0)
Monocytes Absolute: 0.6 10*3/uL (ref 0.1–1.0)
Monocytes Relative: 7 % (ref 3–12)
NEUTROS ABS: 6.2 10*3/uL (ref 1.7–7.7)
Neutrophils Relative %: 65 % (ref 43–77)
PLATELETS: 187 10*3/uL (ref 150–400)
RBC: 2.99 MIL/uL — AB (ref 3.87–5.11)
RDW: 15.1 % (ref 11.5–15.5)
WBC: 9.6 10*3/uL (ref 4.0–10.5)

## 2013-12-24 LAB — TYPE AND SCREEN
ABO/RH(D): O NEG
Antibody Screen: NEGATIVE
UNIT DIVISION: 0
Unit division: 0

## 2013-12-24 LAB — BASIC METABOLIC PANEL
Anion gap: 14 (ref 5–15)
BUN: 124 mg/dL — ABNORMAL HIGH (ref 6–23)
CALCIUM: 8.6 mg/dL (ref 8.4–10.5)
CO2: 27 meq/L (ref 19–32)
Chloride: 98 mEq/L (ref 96–112)
Creatinine, Ser: 2.73 mg/dL — ABNORMAL HIGH (ref 0.50–1.10)
GFR calc Af Amer: 16 mL/min — ABNORMAL LOW (ref >90)
GFR, EST NON AFRICAN AMERICAN: 14 mL/min — AB (ref >90)
GLUCOSE: 107 mg/dL — AB (ref 70–99)
POTASSIUM: 4.6 meq/L (ref 3.7–5.3)
Sodium: 139 mEq/L (ref 137–147)

## 2013-12-24 MED ORDER — METOPROLOL TARTRATE 25 MG PO TABS
12.5000 mg | ORAL_TABLET | Freq: Two times a day (BID) | ORAL | Status: DC
Start: 2013-12-24 — End: 2013-12-26
  Administered 2013-12-24 – 2013-12-26 (×5): 12.5 mg via ORAL
  Filled 2013-12-24 (×5): qty 1

## 2013-12-24 NOTE — Progress Notes (Signed)
Donut pillow was brought to the patient per patient/family request due to a bed sore that seems to be worsening on the patients sacral area.  The patient tried the pillow and then refused it stating that she would rather keep the pillows.  I have been placing barrier cream on the patients buttock/sacral area and continue to reposition the patient approx Q2 hours.  The pillow was left in the room in case the patient changes her mind.

## 2013-12-24 NOTE — Progress Notes (Signed)
Subjective: She says she feels better. Receiving blood seems to have helped. She is still short of breath. She complains of back pain pain in her perineal area and anxiety  Objective: Vital signs in last 24 hours: Temp:  [97.7 F (36.5 C)-98.8 F (37.1 C)] 98.3 F (36.8 C) (10/11 0624) Pulse Rate:  [60-77] 68 (10/11 0624) Resp:  [18-20] 18 (10/11 0624) BP: (83-118)/(30-53) 110/51 mmHg (10/11 0624) SpO2:  [88 %-100 %] 100 % (10/11 0624) Weight:  [66.5 kg (146 lb 9.7 oz)] 66.5 kg (146 lb 9.7 oz) (10/11 4268) Weight change: 0.728 kg (1 lb 9.7 oz) Last BM Date: 12/21/13  Intake/Output from previous day: 10/10 0701 - 10/11 0700 In: 756 [Blood:756] Out: 400 [Urine:400]  PHYSICAL EXAM General appearance: alert, cooperative and moderate distress Resp: rhonchi bilaterally Cardio: She has atrial fibrillation with well-controlled ventricular response GI: soft, non-tender; bowel sounds normal; no masses,  no organomegaly Extremities: venous stasis dermatitis noted  Lab Results:  Results for orders placed during the hospital encounter of 12/22/13 (from the past 48 hour(s))  I-STAT CHEM 8, ED     Status: Abnormal   Collection Time    12/22/13  1:19 PM      Result Value Ref Range   Sodium 137  137 - 147 mEq/L   Potassium 4.8  3.7 - 5.3 mEq/L   Chloride 102  96 - 112 mEq/L   BUN 124 (*) 6 - 23 mg/dL   Creatinine, Ser 2.60 (*) 0.50 - 1.10 mg/dL   Glucose, Bld 126 (*) 70 - 99 mg/dL   Calcium, Ion 1.15  1.13 - 1.30 mmol/L   TCO2 28  0 - 100 mmol/L   Hemoglobin 10.5 (*) 12.0 - 15.0 g/dL   HCT 31.0 (*) 36.0 - 46.0 %  I-STAT TROPOININ, ED     Status: None   Collection Time    12/22/13  1:19 PM      Result Value Ref Range   Troponin i, poc 0.02  0.00 - 0.08 ng/mL   Comment 3            Comment: Due to the release kinetics of cTnI,     a negative result within the first hours     of the onset of symptoms does not rule out     myocardial infarction with certainty.     If myocardial  infarction is still suspected,     repeat the test at appropriate intervals.  PRO B NATRIURETIC PEPTIDE     Status: Abnormal   Collection Time    12/22/13  1:55 PM      Result Value Ref Range   Pro B Natriuretic peptide (BNP) 12805.0 (*) 0 - 450 pg/mL  PROTIME-INR     Status: Abnormal   Collection Time    12/22/13  3:43 PM      Result Value Ref Range   Prothrombin Time 29.3 (*) 11.6 - 15.2 seconds   INR 2.77 (*) 0.00 - 1.49  CBC WITH DIFFERENTIAL     Status: Abnormal   Collection Time    12/22/13  5:41 PM      Result Value Ref Range   WBC 12.4 (*) 4.0 - 10.5 K/uL   RBC 2.27 (*) 3.87 - 5.11 MIL/uL   Hemoglobin 7.1 (*) 12.0 - 15.0 g/dL   Comment: DELTA CHECK NOTED   HCT 22.9 (*) 36.0 - 46.0 %   MCV 100.9 (*) 78.0 - 100.0 fL   MCH 31.3  26.0 -  34.0 pg   MCHC 31.0  30.0 - 36.0 g/dL   RDW 14.7  11.5 - 15.5 %   Platelets 152  150 - 400 K/uL   Neutrophils Relative % 83 (*) 43 - 77 %   Neutro Abs 10.3 (*) 1.7 - 7.7 K/uL   Lymphocytes Relative 11 (*) 12 - 46 %   Lymphs Abs 1.4  0.7 - 4.0 K/uL   Monocytes Relative 5  3 - 12 %   Monocytes Absolute 0.6  0.1 - 1.0 K/uL   Eosinophils Relative 0  0 - 5 %   Eosinophils Absolute 0.0  0.0 - 0.7 K/uL   Basophils Relative 0  0 - 1 %   Basophils Absolute 0.0  0.0 - 0.1 K/uL  MRSA PCR SCREENING     Status: None   Collection Time    12/22/13  6:10 PM      Result Value Ref Range   MRSA by PCR NEGATIVE  NEGATIVE   Comment:            The GeneXpert MRSA Assay (FDA     approved for NASAL specimens     only), is one component of a     comprehensive MRSA colonization     surveillance program. It is not     intended to diagnose MRSA     infection nor to guide or     monitor treatment for     MRSA infections.  PROTIME-INR     Status: Abnormal   Collection Time    12/23/13  6:58 AM      Result Value Ref Range   Prothrombin Time 29.9 (*) 11.6 - 15.2 seconds   INR 2.85 (*) 0.00 - 1.24  BASIC METABOLIC PANEL     Status: Abnormal   Collection  Time    12/23/13  6:58 AM      Result Value Ref Range   Sodium 139  137 - 147 mEq/L   Potassium 4.9  3.7 - 5.3 mEq/L   Chloride 99  96 - 112 mEq/L   CO2 28  19 - 32 mEq/L   Glucose, Bld 124 (*) 70 - 99 mg/dL   BUN 122 (*) 6 - 23 mg/dL   Creatinine, Ser 2.75 (*) 0.50 - 1.10 mg/dL   Calcium 8.6  8.4 - 10.5 mg/dL   GFR calc non Af Amer 14 (*) >90 mL/min   GFR calc Af Amer 16 (*) >90 mL/min   Comment: (NOTE)     The eGFR has been calculated using the CKD EPI equation.     This calculation has not been validated in all clinical situations.     eGFR's persistently <90 mL/min signify possible Chronic Kidney     Disease.   Anion gap 12  5 - 15  CBC WITH DIFFERENTIAL     Status: Abnormal   Collection Time    12/23/13  6:58 AM      Result Value Ref Range   WBC 10.6 (*) 4.0 - 10.5 K/uL   RBC 2.14 (*) 3.87 - 5.11 MIL/uL   Hemoglobin 6.9 (*) 12.0 - 15.0 g/dL   Comment: CRITICAL RESULT CALLED TO, READ BACK BY AND VERIFIED WITH:     WATKINS T. AT 0741A ON [id number] BY THOMPSON S.   HCT 22.2 (*) 36.0 - 46.0 %   MCV 103.7 (*) 78.0 - 100.0 fL   MCH 32.2  26.0 - 34.0 pg   MCHC 31.1  30.0 - 36.0 g/dL  RDW 14.1  11.5 - 15.5 %   Platelets 204  150 - 400 K/uL   Comment: DELTA CHECK NOTED     RESULT REPEATED AND VERIFIED   Neutrophils Relative % 72  43 - 77 %   Lymphocytes Relative 19  12 - 46 %   Monocytes Relative 7  3 - 12 %   Eosinophils Relative 1  0 - 5 %   Basophils Relative 1  0 - 1 %   Neutro Abs 7.7  1.7 - 7.7 K/uL   Lymphs Abs 2.0  0.7 - 4.0 K/uL   Monocytes Absolute 0.7  0.1 - 1.0 K/uL   Eosinophils Absolute 0.1  0.0 - 0.7 K/uL   Basophils Absolute 0.1  0.0 - 0.1 K/uL   RBC Morphology BASOPHILIC STIPPLING     WBC Morphology ATYPICAL LYMPHOCYTES    TYPE AND SCREEN     Status: None   Collection Time    12/23/13 10:30 AM      Result Value Ref Range   ABO/RH(D) O NEG     Antibody Screen NEG     Sample Expiration 12/26/2013     Unit Number [id number]     Blood Component Type  RED CELLS,LR     Unit division 00     Status of Unit ISSUED,FINAL     Transfusion Status OK TO TRANSFUSE     Crossmatch Result Compatible     Unit Number [id number]     Blood Component Type RED CELLS,LR     Unit division 00     Status of Unit ISSUED,FINAL     Transfusion Status OK TO TRANSFUSE     Crossmatch Result Compatible    PREPARE RBC (CROSSMATCH)     Status: None   Collection Time    12/23/13 10:30 AM      Result Value Ref Range   Order Confirmation ORDER PROCESSED BY BLOOD BANK    ABO/RH     Status: None   Collection Time    12/23/13 10:30 AM      Result Value Ref Range   ABO/RH(D) O NEG    COMPREHENSIVE METABOLIC PANEL     Status: Abnormal   Collection Time    12/23/13  9:44 PM      Result Value Ref Range   Sodium 139  137 - 147 mEq/L   Potassium 4.8  3.7 - 5.3 mEq/L   Chloride 99  96 - 112 mEq/L   CO2 26  19 - 32 mEq/L   Glucose, Bld 131 (*) 70 - 99 mg/dL   BUN 125 (*) 6 - 23 mg/dL   Creatinine, Ser 2.82 (*) 0.50 - 1.10 mg/dL   Calcium 8.6  8.4 - 10.5 mg/dL   Total Protein 5.8 (*) 6.0 - 8.3 g/dL   Albumin 2.7 (*) 3.5 - 5.2 g/dL   AST 12  0 - 37 U/L   ALT 9  0 - 35 U/L   Alkaline Phosphatase 106  39 - 117 U/L   Total Bilirubin 0.4  0.3 - 1.2 mg/dL   GFR calc non Af Amer 13 (*) >90 mL/min   GFR calc Af Amer 15 (*) >90 mL/min   Comment: (NOTE)     The eGFR has been calculated using the CKD EPI equation.     This calculation has not been validated in all clinical situations.     eGFR's persistently <90 mL/min signify possible Chronic Kidney     Disease.   Anion  gap 14  5 - 15  HEMOGLOBIN AND HEMATOCRIT, BLOOD     Status: Abnormal   Collection Time    12/23/13  9:44 PM      Result Value Ref Range   Hemoglobin 9.2 (*) 12.0 - 15.0 g/dL   Comment: POST TRANSFUSION SPECIMEN   HCT 28.3 (*) 36.0 - 24.4 %  BASIC METABOLIC PANEL     Status: Abnormal   Collection Time    12/24/13  6:05 AM      Result Value Ref Range   Sodium 139  137 - 147 mEq/L   Potassium  4.6  3.7 - 5.3 mEq/L   Chloride 98  96 - 112 mEq/L   CO2 27  19 - 32 mEq/L   Glucose, Bld 107 (*) 70 - 99 mg/dL   BUN 124 (*) 6 - 23 mg/dL   Creatinine, Ser 2.73 (*) 0.50 - 1.10 mg/dL   Calcium 8.6  8.4 - 10.5 mg/dL   GFR calc non Af Amer 14 (*) >90 mL/min   GFR calc Af Amer 16 (*) >90 mL/min   Comment: (NOTE)     The eGFR has been calculated using the CKD EPI equation.     This calculation has not been validated in all clinical situations.     eGFR's persistently <90 mL/min signify possible Chronic Kidney     Disease.   Anion gap 14  5 - 15  CBC WITH DIFFERENTIAL     Status: Abnormal   Collection Time    12/24/13  6:05 AM      Result Value Ref Range   WBC 9.6  4.0 - 10.5 K/uL   RBC 2.99 (*) 3.87 - 5.11 MIL/uL   Hemoglobin 9.4 (*) 12.0 - 15.0 g/dL   HCT 28.6 (*) 36.0 - 46.0 %   MCV 95.7  78.0 - 100.0 fL   Comment: DELTA CHECK NOTED   MCH 31.4  26.0 - 34.0 pg   MCHC 32.9  30.0 - 36.0 g/dL   RDW 15.1  11.5 - 15.5 %   Platelets 187  150 - 400 K/uL   Neutrophils Relative % 65  43 - 77 %   Neutro Abs 6.2  1.7 - 7.7 K/uL   Lymphocytes Relative 26  12 - 46 %   Lymphs Abs 2.5  0.7 - 4.0 K/uL   Monocytes Relative 7  3 - 12 %   Monocytes Absolute 0.6  0.1 - 1.0 K/uL   Eosinophils Relative 3  0 - 5 %   Eosinophils Absolute 0.3  0.0 - 0.7 K/uL   Basophils Relative 0  0 - 1 %   Basophils Absolute 0.0  0.0 - 0.1 K/uL    ABGS  Recent Labs  12/22/13 1319  TCO2 28   CULTURES Recent Results (from the past 240 hour(s))  MRSA PCR SCREENING     Status: None   Collection Time    12/22/13  6:10 PM      Result Value Ref Range Status   MRSA by PCR NEGATIVE  NEGATIVE Final   Comment:            The GeneXpert MRSA Assay (FDA     approved for NASAL specimens     only), is one component of a     comprehensive MRSA colonization     surveillance program. It is not     intended to diagnose MRSA     infection nor to guide or  monitor treatment for     MRSA infections.    Studies/Results: Dg Chest Portable 1 View  12/22/2013   CLINICAL DATA:  Persistent shortness of breath with nausea and epigastric pain for 1 week  EXAM: PORTABLE CHEST - 1 VIEW  COMPARISON:  December 21, 2013  FINDINGS: Underlying emphysema is again noted. There is cardiomegaly with bibasilar interstitial edema and small effusions, stable. No new opacity. Pacemaker leads are attached to the right atrium and right ventricle. No adenopathy. Bones are osteoporotic.  IMPRESSION: Congestive heart failure superimposed on emphysematous change, stable. No new opacity.   Electronically Signed   By: Lowella Grip M.D.   On: 12/22/2013 13:19    Medications:  Prior to Admission:  Prescriptions prior to admission  Medication Sig Dispense Refill  . aspirin EC 81 MG EC tablet Take 1 tablet (81 mg total) by mouth daily.  30 tablet  12  . HYDROcodone-acetaminophen (NORCO/VICODIN) 5-325 MG per tablet Take 1 tablet by mouth every 4 (four) hours as needed for severe pain.      [name] losartan (COZAAR) 100 MG tablet Take 100 mg by mouth daily.      . metoprolol tartrate (LOPRESSOR) 25 MG tablet Take 25 mg by mouth 2 (two) times daily.      . Multiple Vitamins-Minerals (PRESERVISION AREDS 2) CAPS Take 1 capsule by mouth 2 (two) times daily.      . nitroGLYCERIN (NITROSTAT) 0.4 MG SL tablet Place 1 tablet (0.4 mg total) under the tongue every 5 (five) minutes as needed for chest pain.  24 tablet  4  . pravastatin (PRAVACHOL) 40 MG tablet Take 40 mg by mouth at bedtime.      . torsemide (DEMADEX) 20 MG tablet Take 1 tablet (20 mg total) by mouth 2 (two) times daily. Take 1 tablet daily  60 tablet  0  . verapamil (CALAN-SR) 180 MG CR tablet Take 180 mg by mouth at bedtime.      [name] warfarin (COUMADIN) 2.5 MG tablet Take 1.25-2.5 mg by mouth daily. Patient takes 1.61m on Mon, Wed and Fri. And 2.529mall other days       Scheduled: . antiseptic oral rinse  7 mL Mouth Rinse BID  . aspirin EC  81 mg Oral Daily  . feeding  supplement (PRO-STAT SUGAR FREE 64)  30 mL Oral TID WC  . furosemide  20 mg Intravenous Daily  . metoprolol tartrate  12.5 mg Oral BID  . pravastatin  40 mg Oral QHS  . sodium chloride  3 mL Intravenous Q12H  . verapamil  180 mg Oral QHS   Continuous:  PRWOE:HOZYYQhloride, ALPRAZolam, morphine injection, nitroGLYCERIN, ondansetron (ZOFRAN) IV, oxyCODONE, sodium chloride  Assesment: She was admitted with acute on chronic diastolic heart failure. She was anemic. She has aortic stenosis. She has cirrhosis of the liver. She has acute on chronic renal failure. Her prognosis is extremely poor. We discussed this again and I explained to her that we can treat her symptoms and make her more comfortable but I don't think that we can solve her medical problems and she understands that. She has pain medication anxiety medication etc. available. She is scheduled to go to assisted living facility next week at her concern is that she's to sig and complicated to be managed at that level of care. She has long-term care insurance so I will ask for social work help Principal Problem:   Acute on chronic diastolic congestive heart failure Active Problems:   Aortic  stenosis   Paroxysmal atrial fibrillation   Hepatic cirrhosis   Acute on chronic renal failure    Plan: Social work consult. Continue his treatments. I'm going to reduce her metoprolol because her blood pressure dropped into the 70s yesterday    LOS: 2 days   HAWKINS,EDWARD L 12/24/2013, 8:36 AM

## 2013-12-25 LAB — BASIC METABOLIC PANEL
ANION GAP: 12 (ref 5–15)
BUN: 115 mg/dL — ABNORMAL HIGH (ref 6–23)
CALCIUM: 8.7 mg/dL (ref 8.4–10.5)
CO2: 28 mEq/L (ref 19–32)
Chloride: 102 mEq/L (ref 96–112)
Creatinine, Ser: 2.34 mg/dL — ABNORMAL HIGH (ref 0.50–1.10)
GFR calc Af Amer: 19 mL/min — ABNORMAL LOW (ref >90)
GFR calc non Af Amer: 16 mL/min — ABNORMAL LOW (ref >90)
Glucose, Bld: 114 mg/dL — ABNORMAL HIGH (ref 70–99)
Potassium: 4.4 mEq/L (ref 3.7–5.3)
SODIUM: 142 meq/L (ref 137–147)

## 2013-12-25 LAB — CBC WITH DIFFERENTIAL/PLATELET
Basophils Absolute: 0 10*3/uL (ref 0.0–0.1)
Basophils Relative: 0 % (ref 0–1)
Eosinophils Absolute: 0.2 10*3/uL (ref 0.0–0.7)
Eosinophils Relative: 2 % (ref 0–5)
HEMATOCRIT: 29.1 % — AB (ref 36.0–46.0)
Hemoglobin: 9.3 g/dL — ABNORMAL LOW (ref 12.0–15.0)
LYMPHS PCT: 19 % (ref 12–46)
Lymphs Abs: 2.2 10*3/uL (ref 0.7–4.0)
MCH: 31.3 pg (ref 26.0–34.0)
MCHC: 32 g/dL (ref 30.0–36.0)
MCV: 98 fL (ref 78.0–100.0)
Monocytes Absolute: 0.6 10*3/uL (ref 0.1–1.0)
Monocytes Relative: 5 % (ref 3–12)
NEUTROS ABS: 8.7 10*3/uL — AB (ref 1.7–7.7)
NEUTROS PCT: 74 % (ref 43–77)
Platelets: 209 10*3/uL (ref 150–400)
RBC: 2.97 MIL/uL — ABNORMAL LOW (ref 3.87–5.11)
RDW: 14.8 % (ref 11.5–15.5)
WBC: 11.8 10*3/uL — AB (ref 4.0–10.5)

## 2013-12-25 MED ORDER — LEVALBUTEROL HCL 0.63 MG/3ML IN NEBU
0.6300 mg | INHALATION_SOLUTION | Freq: Four times a day (QID) | RESPIRATORY_TRACT | Status: DC
  Administered 2013-12-25 – 2013-12-26 (×5): 0.63 mg via RESPIRATORY_TRACT
  Filled 2013-12-25 (×4): qty 3

## 2013-12-25 MED ORDER — POLYETHYLENE GLYCOL 3350 17 G PO PACK
17.0000 g | PACK | Freq: Every day | ORAL | Status: DC
  Administered 2013-12-25 – 2013-12-26 (×2): 17 g via ORAL
  Filled 2013-12-25 (×2): qty 1

## 2013-12-25 MED ORDER — FUROSEMIDE 10 MG/ML IJ SOLN
20.0000 mg | Freq: Two times a day (BID) | INTRAMUSCULAR | Status: DC
  Administered 2013-12-25 – 2013-12-26 (×3): 20 mg via INTRAVENOUS
  Filled 2013-12-25 (×3): qty 2

## 2013-12-25 NOTE — Evaluation (Signed)
Physical Therapy Evaluation Patient Details Name: [name] MRN: [id number] DOB: 03-15-1916 Today's Date: 12/25/2013   History of Present Illness  Pt is a 80 year old female admitted with acute on chronic CHF.  She has a history of aortic stenosis and hepatic cirrhosis requiring occasional paracentesis.  She has been living alone with a CG from 9 PM to 9AM.  She has been ambulatory with a walker.  She has chronic LE edema and significant orthopnea which necessitates her sleeping in a recliner chair.  Clinical Impression   Pt was seen for evaluation.  She was very fatigued at the time of my visit and was needing supplemental O2 to maintain O2 sat in the 90s.  She did c/o labored breathing even though O2 sat was 95%.  She needs min assist to transfer to standing  But is too fatigued to try to ambulate.  My recommendation is for her to transition to SNF in order to facilitate her return to baseline function.    Follow Up Recommendations SNF    Equipment Recommendations  None recommended by PT    Recommendations for Other Services   none    Precautions / Restrictions Precautions Precautions: Fall Restrictions Weight Bearing Restrictions: No      Mobility  Bed Mobility               General bed mobility comments: not tested...pt does not sleep in a bed  Transfers Overall transfer level: Needs assistance Equipment used: Rolling walker (2 wheeled) Transfers: Stand Pivot Transfers;Sit to/from Stand Sit to Stand: Min assist Stand pivot transfers: Min assist          Ambulation/Gait Ambulation/Gait assistance:  (unable to amblate due to fatigue)              Stairs            Wheelchair Mobility    Modified Rankin (Stroke Patients Only)       Balance                                             Pertinent Vitals/Pain Pain Assessment: No/denies pain    Home Living Family/patient expects to be discharged to:: Skilled nursing  facility                      Prior Function Level of Independence: Needs assistance   Gait / Transfers Assistance Needed: pt is ambulatory with a walker independently and transfers with no assist  ADL's / Homemaking Assistance Needed: needs assist with all household duties and bathing        Hand Dominance        Extremity/Trunk Assessment   Upper Extremity Assessment: Generalized weakness           Lower Extremity Assessment: Generalized weakness      Cervical / Trunk Assessment: Kyphotic  Communication   Communication: HOH  Cognition Arousal/Alertness: Lethargic Behavior During Therapy: WFL for tasks assessed/performed Overall Cognitive Status: Within Functional Limits for tasks assessed                      General Comments      Exercises        Assessment/Plan    PT Assessment All further PT needs can be met in the next venue of care  PT Diagnosis Difficulty walking;Generalized weakness  PT Problem List Decreased strength;Decreased activity tolerance;Decreased mobility;Cardiopulmonary status limiting activity  PT Treatment Interventions     PT Goals (Current goals can be found in the Care Plan section) Acute Rehab PT Goals PT Goal Formulation: No goals set, d/c therapy    Frequency     Barriers to discharge  decreased CG assist at home      Co-evaluation               End of Session   Activity Tolerance: Patient limited by fatigue Patient left: in chair;with call bell/phone within reach;with chair alarm set;with family/visitor present Nurse Communication: Mobility status         Time: 1700-1749 PT Time Calculation (min): 24 min   Charges:   PT Evaluation $Initial PT Evaluation Tier I: 1 Procedure     PT G CodesDemetrios Isaacs L 12/25/2013, 11:59 AM

## 2013-12-25 NOTE — Clinical Social Work Psychosocial (Signed)
Clinical Social Work Department BRIEF PSYCHOSOCIAL ASSESSMENT 12/25/2013  Patient:  [name],[name]     Account Number:  [id number]     Admit date:  12/22/2013  Clinical Social Worker:  Wyatt Haste  Date/Time:  12/25/2013 03:55 PM  Referred by:  Physician  Date Referred:  12/25/2013 Referred for  SNF Placement   Other Referral:   Interview type:  Patient Other interview type:   son- [name]    PSYCHOSOCIAL DATA Living Status:  ALONE Admitted from facility:   Level of care:   Primary support name:  [name] Primary support relationship to patient:  CHILD, ADULT Degree of support available:   supportive    CURRENT CONCERNS Current Concerns  Post-Acute Placement   Other Concerns:    SOCIAL WORK ASSESSMENT / PLAN CSW met with pt and pt'[name] son at bedside. Pt'[name] two neighbors were also present with permission. Pt alert and oriented and reports she lives alone. She has been managing fairly well but has been very short of breath and weak recently. Pt states her best support is her son, [name] who lives around the corner. Pt'[name] neighbors that were present are also very close to pt and she considers them grandsons. [name] was looking into [facility] for pt prior to admission. However, MD and PT feel pt will need a higher level of care. Pt definitely feels that she needs around the clock care. CSW discussed PT recommendation of SNF. They are aware of Medicare coverage/criteria. Definitely prefer [facility], but open to [facility] as back up. Pt expresses that [name]'[name] wife has cancer and is also in hospital. They became emotional discussing situation. [name] appears to be overwhelmed with needs of family. CSW validated feelings and provided support.   Assessment/plan status:  Psychosocial Support/Ongoing Assessment of Needs Other assessment/ plan:   Information/referral to community resources:   SNF list    PATIENT'[name]/FAMILY'[name] RESPONSE TO PLAN OF CARE: Pt agreeable to SNF. However,  she would like to return home with around the clock care if possible at some point. Pt has long term care insurance.       [name], [name]

## 2013-12-25 NOTE — Care Management Note (Unsigned)
    Page 1 of 1   12/25/2013     3:12:49 PM CARE MANAGEMENT NOTE 12/25/2013  Patient:  [name],[name]   Account Number:  [id number]  Date Initiated:  12/25/2013  Documentation initiated by:  Vladimir Creeks  Subjective/Objective Assessment:   admitted with DOE, CHF. Pt is from home, alone,  but pt and family have decided she will need a rehab stay prior to returning home. CSW aware     Action/Plan:   pt to go to SNF   Anticipated DC Date:  12/26/2013   Anticipated DC Plan:  [place]  In-house referral  Clinical Social Worker      DC Planning Services  CM consult      Choice offered to / List presented to:  C-1 Patient           Status of service:  In process, will continue to follow Medicare Important Message given?   (If response is "NO", the following Medicare IM given date fields will be blank) Date Medicare IM given:   Medicare IM given by:   Date Additional Medicare IM given:   Additional Medicare IM given by:    Discharge Disposition:    Per UR Regulation:  Reviewed for med. necessity/level of care/duration of stay  If discussed at [place] of Stay Meetings, dates discussed:    Comments:  12/25/13 [place] RN/CM

## 2013-12-25 NOTE — Clinical Social Work Placement (Signed)
Clinical Social Work Department CLINICAL SOCIAL WORK PLACEMENT NOTE 12/25/2013  Patient:  Shetterly,Verlena S  Account Number:  [id number] Admit date:  12/22/2013  Clinical Social Worker:  Benay Pike, LCSW  Date/time:  12/25/2013 03:53 PM  Clinical Social Work is seeking post-discharge placement for this patient at the following level of care:   [place]   (*CSW will update this form in Epic as items are completed)   12/25/2013  Patient/family provided with [place] Department of Clinical Social Work's list of facilities offering this level of care within the geographic area requested by the patient (or if unable, by the patient's family).  12/25/2013  Patient/family informed of their freedom to choose among providers that offer the needed level of care, that participate in Medicare, Medicaid or managed care program needed by the patient, have an available bed and are willing to accept the patient.  12/25/2013  Patient/family informed of [facility]' ownership interest in [facility], as well as of the fact that they are under no obligation to receive care at this facility.  PASARR submitted to EDS on 12/25/2013 PASARR number received on 12/25/2013  FL2 transmitted to all facilities in geographic area requested by pt/family on  12/25/2013 FL2 transmitted to all facilities within larger geographic area on   Patient informed that his/her managed care company has contracts with or will negotiate with  certain facilities, including the following:     Patient/family informed of bed offers received:   Patient chooses bed at  Physician recommends and patient chooses bed at    Patient to be transferred to  on   Patient to be transferred to facility by  Patient and family notified of transfer on  Name of family member notified:    The following physician request were entered in Epic:   Additional Comments:  Benay Pike, [place]

## 2013-12-25 NOTE — Progress Notes (Signed)
Subjective: She has no new complaints. She is still short of breath. She still has pain in her back and perineal area.  Objective: Vital signs in last 24 hours: Temp:  [98 F (36.7 C)-99.2 F (37.3 C)] 99.2 F (37.3 C) (10/12 0549) Pulse Rate:  [75-82] 75 (10/12 0549) Resp:  [18-20] 20 (10/12 0549) BP: (108-124)/(38-49) 124/38 mmHg (10/12 0549) SpO2:  [97 %-100 %] 97 % (10/12 0549) Weight:  [65.4 kg (144 lb 2.9 oz)] 65.4 kg (144 lb 2.9 oz) (10/12 0549) Weight change: -1.1 kg (-2 lb 6.8 oz) Last BM Date: 12/21/13  Intake/Output from previous day: 10/11 0701 - 10/12 0700 In: 240 [P.O.:240] Out: 1700 [Urine:1700]  PHYSICAL EXAM General appearance: alert, cooperative and moderate distress Resp: Rhonchi bilaterally Cardio: Her heart is irregularly irregular with a loud systolic heart murmur GI: soft, non-tender; bowel sounds normal; no masses,  no organomegaly Extremities: 2+ edema with venous stasis changes also  Lab Results:  Results for orders placed during the hospital encounter of 12/22/13 (from the past 48 hour(s))  TYPE AND SCREEN     Status: None   Collection Time    12/23/13 10:30 AM      Result Value Ref Range   ABO/RH(D) O NEG     Antibody Screen NEG     Sample Expiration 12/26/2013     Unit Number [id number]     Blood Component Type RED CELLS,LR     Unit division 00     Status of Unit ISSUED,FINAL     Transfusion Status OK TO TRANSFUSE     Crossmatch Result Compatible     Unit Number [id number]     Blood Component Type RED CELLS,LR     Unit division 00     Status of Unit ISSUED,FINAL     Transfusion Status OK TO TRANSFUSE     Crossmatch Result Compatible    PREPARE RBC (CROSSMATCH)     Status: None   Collection Time    12/23/13 10:30 AM      Result Value Ref Range   Order Confirmation ORDER PROCESSED BY BLOOD BANK    ABO/RH     Status: None   Collection Time    12/23/13 10:30 AM      Result Value Ref Range   ABO/RH(D) O NEG    COMPREHENSIVE  METABOLIC PANEL     Status: Abnormal   Collection Time    12/23/13  9:44 PM      Result Value Ref Range   Sodium 139  137 - 147 mEq/L   Potassium 4.8  3.7 - 5.3 mEq/L   Chloride 99  96 - 112 mEq/L   CO2 26  19 - 32 mEq/L   Glucose, Bld 131 (*) 70 - 99 mg/dL   BUN 125 (*) 6 - 23 mg/dL   Creatinine, Ser 2.82 (*) 0.50 - 1.10 mg/dL   Calcium 8.6  8.4 - 10.5 mg/dL   Total Protein 5.8 (*) 6.0 - 8.3 g/dL   Albumin 2.7 (*) 3.5 - 5.2 g/dL   AST 12  0 - 37 U/L   ALT 9  0 - 35 U/L   Alkaline Phosphatase 106  39 - 117 U/L   Total Bilirubin 0.4  0.3 - 1.2 mg/dL   GFR calc non Af Amer 13 (*) >90 mL/min   GFR calc Af Amer 15 (*) >90 mL/min   Comment: (NOTE)     The eGFR has been calculated using the CKD EPI equation.  This calculation has not been validated in all clinical situations.     eGFR's persistently <90 mL/min signify possible Chronic Kidney     Disease.   Anion gap 14  5 - 15  HEMOGLOBIN AND HEMATOCRIT, BLOOD     Status: Abnormal   Collection Time    12/23/13  9:44 PM      Result Value Ref Range   Hemoglobin 9.2 (*) 12.0 - 15.0 g/dL   Comment: POST TRANSFUSION SPECIMEN   HCT 28.3 (*) 36.0 - 65.7 %  BASIC METABOLIC PANEL     Status: Abnormal   Collection Time    12/24/13  6:05 AM      Result Value Ref Range   Sodium 139  137 - 147 mEq/L   Potassium 4.6  3.7 - 5.3 mEq/L   Chloride 98  96 - 112 mEq/L   CO2 27  19 - 32 mEq/L   Glucose, Bld 107 (*) 70 - 99 mg/dL   BUN 124 (*) 6 - 23 mg/dL   Creatinine, Ser 2.73 (*) 0.50 - 1.10 mg/dL   Calcium 8.6  8.4 - 10.5 mg/dL   GFR calc non Af Amer 14 (*) >90 mL/min   GFR calc Af Amer 16 (*) >90 mL/min   Comment: (NOTE)     The eGFR has been calculated using the CKD EPI equation.     This calculation has not been validated in all clinical situations.     eGFR's persistently <90 mL/min signify possible Chronic Kidney     Disease.   Anion gap 14  5 - 15  CBC WITH DIFFERENTIAL     Status: Abnormal   Collection Time    12/24/13  6:05  AM      Result Value Ref Range   WBC 9.6  4.0 - 10.5 K/uL   RBC 2.99 (*) 3.87 - 5.11 MIL/uL   Hemoglobin 9.4 (*) 12.0 - 15.0 g/dL   HCT 28.6 (*) 36.0 - 46.0 %   MCV 95.7  78.0 - 100.0 fL   Comment: DELTA CHECK NOTED   MCH 31.4  26.0 - 34.0 pg   MCHC 32.9  30.0 - 36.0 g/dL   RDW 15.1  11.5 - 15.5 %   Platelets 187  150 - 400 K/uL   Neutrophils Relative % 65  43 - 77 %   Neutro Abs 6.2  1.7 - 7.7 K/uL   Lymphocytes Relative 26  12 - 46 %   Lymphs Abs 2.5  0.7 - 4.0 K/uL   Monocytes Relative 7  3 - 12 %   Monocytes Absolute 0.6  0.1 - 1.0 K/uL   Eosinophils Relative 3  0 - 5 %   Eosinophils Absolute 0.3  0.0 - 0.7 K/uL   Basophils Relative 0  0 - 1 %   Basophils Absolute 0.0  0.0 - 0.1 K/uL  BASIC METABOLIC PANEL     Status: Abnormal   Collection Time    12/25/13  6:38 AM      Result Value Ref Range   Sodium 142  137 - 147 mEq/L   Potassium 4.4  3.7 - 5.3 mEq/L   Chloride 102  96 - 112 mEq/L   CO2 28  19 - 32 mEq/L   Glucose, Bld 114 (*) 70 - 99 mg/dL   BUN 115 (*) 6 - 23 mg/dL   Creatinine, Ser 2.34 (*) 0.50 - 1.10 mg/dL   Calcium 8.7  8.4 - 10.5 mg/dL   GFR  calc non Af Amer 16 (*) >90 mL/min   GFR calc Af Amer 19 (*) >90 mL/min   Comment: (NOTE)     The eGFR has been calculated using the CKD EPI equation.     This calculation has not been validated in all clinical situations.     eGFR's persistently <90 mL/min signify possible Chronic Kidney     Disease.   Anion gap 12  5 - 15  CBC WITH DIFFERENTIAL     Status: Abnormal   Collection Time    12/25/13  6:38 AM      Result Value Ref Range   WBC 11.8 (*) 4.0 - 10.5 K/uL   RBC 2.97 (*) 3.87 - 5.11 MIL/uL   Hemoglobin 9.3 (*) 12.0 - 15.0 g/dL   HCT 29.1 (*) 36.0 - 46.0 %   MCV 98.0  78.0 - 100.0 fL   MCH 31.3  26.0 - 34.0 pg   MCHC 32.0  30.0 - 36.0 g/dL   RDW 14.8  11.5 - 15.5 %   Platelets 209  150 - 400 K/uL   Neutrophils Relative % 74  43 - 77 %   Neutro Abs 8.7 (*) 1.7 - 7.7 K/uL   Lymphocytes Relative 19  12 -  46 %   Lymphs Abs 2.2  0.7 - 4.0 K/uL   Monocytes Relative 5  3 - 12 %   Monocytes Absolute 0.6  0.1 - 1.0 K/uL   Eosinophils Relative 2  0 - 5 %   Eosinophils Absolute 0.2  0.0 - 0.7 K/uL   Basophils Relative 0  0 - 1 %   Basophils Absolute 0.0  0.0 - 0.1 K/uL    ABGS  Recent Labs  12/22/13 1319  TCO2 28   CULTURES Recent Results (from the past 240 hour(s))  MRSA PCR SCREENING     Status: None   Collection Time    12/22/13  6:10 PM      Result Value Ref Range Status   MRSA by PCR NEGATIVE  NEGATIVE Final   Comment:            The GeneXpert MRSA Assay (FDA     approved for NASAL specimens     only), is one component of a     comprehensive MRSA colonization     surveillance program. It is not     intended to diagnose MRSA     infection nor to guide or     monitor treatment for     MRSA infections.   Studies/Results: No results found.  Medications:  Prior to Admission:  Prescriptions prior to admission  Medication Sig Dispense Refill  . aspirin EC 81 MG EC tablet Take 1 tablet (81 mg total) by mouth daily.  30 tablet  12  . HYDROcodone-acetaminophen (NORCO/VICODIN) 5-325 MG per tablet Take 1 tablet by mouth every 4 (four) hours as needed for severe pain.      [name] losartan (COZAAR) 100 MG tablet Take 100 mg by mouth daily.      . metoprolol tartrate (LOPRESSOR) 25 MG tablet Take 25 mg by mouth 2 (two) times daily.      . Multiple Vitamins-Minerals (PRESERVISION AREDS 2) CAPS Take 1 capsule by mouth 2 (two) times daily.      . nitroGLYCERIN (NITROSTAT) 0.4 MG SL tablet Place 1 tablet (0.4 mg total) under the tongue every 5 (five) minutes as needed for chest pain.  24 tablet  4  . pravastatin (PRAVACHOL) 40  MG tablet Take 40 mg by mouth at bedtime.      . torsemide (DEMADEX) 20 MG tablet Take 1 tablet (20 mg total) by mouth 2 (two) times daily. Take 1 tablet daily  60 tablet  0  . verapamil (CALAN-SR) 180 MG CR tablet Take 180 mg by mouth at bedtime.      [name] warfarin  (COUMADIN) 2.5 MG tablet Take 1.25-2.5 mg by mouth daily. Patient takes 1.61m on Mon, Wed and Fri. And 2.565mall other days       Scheduled: . antiseptic oral rinse  7 mL Mouth Rinse BID  . aspirin EC  81 mg Oral Daily  . feeding supplement (PRO-STAT SUGAR FREE 64)  30 mL Oral TID WC  . furosemide  20 mg Intravenous Q12H  . levalbuterol  0.63 mg Nebulization Q6H  . metoprolol tartrate  12.5 mg Oral BID  . polyethylene glycol  17 g Oral Daily  . pravastatin  40 mg Oral QHS  . sodium chloride  3 mL Intravenous Q12H  . verapamil  180 mg Oral QHS   Continuous:  PRZJI:RCVELFhloride, ALPRAZolam, morphine injection, nitroGLYCERIN, ondansetron (ZOFRAN) IV, oxyCODONE, sodium chloride  Assesment: She was admitted with acute on chronic diastolic heart failure. I think she's having more trouble with that and I'm going to plan to go ahead and increase Lasix. She has aortic stenosis and is not an operative candidate. She has atrial fibrillation and her heart rate is well controlled at this point. She has acute on chronic renal failure which is somewhat better but which may deteriorate with increasing her diuretics. She has cirrhosis of the liver which is a fairly new diagnosis and has some ascites. She has decided to comfort care which I think is appropriate Principal Problem:   Acute on chronic diastolic congestive heart failure Active Problems:   Aortic stenosis   Paroxysmal atrial fibrillation   Hepatic cirrhosis   Acute on chronic renal failure    Plan: Continue current treatments    LOS: 3 days   Naleyah Ohlinger L 12/25/2013, 8:37 AM

## 2013-12-25 NOTE — Progress Notes (Signed)
Notified Dr. Luan Pulling that the patient has had some bouts of V.Tach. She appears to be stable and these episodes seem to appear with transfers to the [facility].  Will continue to monitor her.

## 2013-12-26 ENCOUNTER — Inpatient Hospital Stay
Admission: RE | Admit: 2013-12-26 | Discharge: 2014-01-24 | Disposition: A | Discharge status: Skilled Nursing Facility | Payer: PRIVATE HEALTH INSURANCE | Source: Ambulatory Visit | Attending: Pulmonary Disease | Admitting: Pulmonary Disease

## 2013-12-26 DIAGNOSIS — I350 Nonrheumatic aortic (valve) stenosis: Secondary | ICD-10-CM

## 2013-12-26 DIAGNOSIS — I10 Essential (primary) hypertension: Secondary | ICD-10-CM | POA: Diagnosis present

## 2013-12-26 DIAGNOSIS — K7460 Unspecified cirrhosis of liver: Secondary | ICD-10-CM | POA: Diagnosis present

## 2013-12-26 DIAGNOSIS — Z7901 Long term (current) use of anticoagulants: Secondary | ICD-10-CM

## 2013-12-26 DIAGNOSIS — R188 Other ascites: Secondary | ICD-10-CM | POA: Diagnosis present

## 2013-12-26 DIAGNOSIS — C9110 Chronic lymphocytic leukemia of B-cell type not having achieved remission: Secondary | ICD-10-CM | POA: Diagnosis present

## 2013-12-26 DIAGNOSIS — N189 Chronic kidney disease, unspecified: Secondary | ICD-10-CM | POA: Diagnosis present

## 2013-12-26 DIAGNOSIS — L89151 Pressure ulcer of sacral region, stage 1: Secondary | ICD-10-CM | POA: Diagnosis present

## 2013-12-26 DIAGNOSIS — I5033 Acute on chronic diastolic (congestive) heart failure: Secondary | ICD-10-CM | POA: Diagnosis present

## 2013-12-26 DIAGNOSIS — I2510 Atherosclerotic heart disease of native coronary artery without angina pectoris: Secondary | ICD-10-CM | POA: Diagnosis present

## 2013-12-26 DIAGNOSIS — R0602 Shortness of breath: Principal | ICD-10-CM

## 2013-12-26 DIAGNOSIS — D649 Anemia, unspecified: Secondary | ICD-10-CM | POA: Diagnosis present

## 2013-12-26 LAB — CBC WITH DIFFERENTIAL/PLATELET
Basophils Absolute: 0 10*3/uL (ref 0.0–0.1)
Basophils Relative: 0 % (ref 0–1)
Eosinophils Absolute: 0.1 10*3/uL (ref 0.0–0.7)
Eosinophils Relative: 1 % (ref 0–5)
HEMATOCRIT: 28 % — AB (ref 36.0–46.0)
Hemoglobin: 9 g/dL — ABNORMAL LOW (ref 12.0–15.0)
LYMPHS PCT: 15 % (ref 12–46)
Lymphs Abs: 1.7 10*3/uL (ref 0.7–4.0)
MCH: 31.5 pg (ref 26.0–34.0)
MCHC: 32.1 g/dL (ref 30.0–36.0)
MCV: 97.9 fL (ref 78.0–100.0)
MONO ABS: 0.7 10*3/uL (ref 0.1–1.0)
Monocytes Relative: 6 % (ref 3–12)
Neutro Abs: 9.1 10*3/uL — ABNORMAL HIGH (ref 1.7–7.7)
Neutrophils Relative %: 78 % — ABNORMAL HIGH (ref 43–77)
Platelets: 195 10*3/uL (ref 150–400)
RBC: 2.86 MIL/uL — AB (ref 3.87–5.11)
RDW: 14.3 % (ref 11.5–15.5)
WBC: 11.6 10*3/uL — ABNORMAL HIGH (ref 4.0–10.5)

## 2013-12-26 MED ORDER — ONDANSETRON HCL 4 MG/2ML IJ SOLN: 4.0000 mg | Freq: Four times a day (QID) | INTRAMUSCULAR | Status: AC | PRN

## 2013-12-26 MED ORDER — LEVALBUTEROL HCL 0.63 MG/3ML IN NEBU: 0.6300 mg | INHALATION_SOLUTION | Freq: Two times a day (BID) | RESPIRATORY_TRACT | Status: DC

## 2013-12-26 MED ORDER — POLYETHYLENE GLYCOL 3350 17 G PO PACK: 17.0000 g | PACK | Freq: Every day | ORAL | Status: AC

## 2013-12-26 MED ORDER — MORPHINE SULFATE 2 MG/ML IJ SOLN: 1.0000 mg | INTRAMUSCULAR | Status: AC | PRN

## 2013-12-26 MED ORDER — ALPRAZOLAM 0.25 MG PO TABS: 0.2500 mg | ORAL_TABLET | ORAL | Status: AC | PRN

## 2013-12-26 MED ORDER — METOPROLOL TARTRATE 12.5 MG HALF TABLET: 12.5000 mg | ORAL_TABLET | Freq: Two times a day (BID) | ORAL | Status: AC

## 2013-12-26 MED ORDER — OXYCODONE HCL 5 MG PO TABS: 5.0000 mg | ORAL_TABLET | Freq: Four times a day (QID) | ORAL | Status: DC | PRN

## 2013-12-26 MED ORDER — PRO-STAT SUGAR FREE PO LIQD: 30.0000 mL | Freq: Three times a day (TID) | ORAL | Status: AC

## 2013-12-26 MED ORDER — LEVALBUTEROL HCL 0.63 MG/3ML IN NEBU: 0.6300 mg | INHALATION_SOLUTION | Freq: Two times a day (BID) | RESPIRATORY_TRACT | Status: AC

## 2013-12-26 NOTE — Progress Notes (Signed)
Report called to [facility] at the [facility]. Patient ready for transfer over.

## 2013-12-26 NOTE — Clinical Social Work Note (Signed)
Pt d/c today to SNF. CSW presented bed offers and pt and son choose [facility]. Facility notified. CSW will fax d/c summary upon completion. Son agreeable to get scripts from MD office. Pt to transfer with RN.  Benay Pike, [place]

## 2013-12-26 NOTE — Clinical Social Work Placement (Signed)
Clinical Social Work Department CLINICAL SOCIAL WORK PLACEMENT NOTE 12/26/2013  Patient:  [name],[name]  Account Number:  [id number] Admit date:  12/22/2013  Clinical Social Worker:  Benay Pike, LCSW  Date/time:  12/25/2013 03:53 PM  Clinical Social Work is seeking post-discharge placement for this patient at the following level of care:   [place]   (*CSW will update this form in Epic as items are completed)   12/25/2013  Patient/family provided with [place] Department of Clinical Social Work'[name] list of facilities offering this level of care within the geographic area requested by the patient (or if unable, by the patient'[name] family).  12/25/2013  Patient/family informed of their freedom to choose among providers that offer the needed level of care, that participate in Medicare, Medicaid or managed care program needed by the patient, have an available bed and are willing to accept the patient.  12/25/2013  Patient/family informed of [facility]' ownership interest in [facility], as well as of the fact that they are under no obligation to receive care at this facility.  PASARR submitted to EDS on 12/25/2013 PASARR number received on 12/25/2013  FL2 transmitted to all facilities in geographic area requested by pt/family on  12/25/2013 FL2 transmitted to all facilities within larger geographic area on   Patient informed that his/her managed care company has contracts with or will negotiate with  certain facilities, including the following:     Patient/family informed of bed offers received:  12/26/2013 Patient chooses bed at [facility] Physician recommends and patient chooses bed at  [facility]  Patient to be transferred to [facility] on  12/26/2013 Patient to be transferred to facility by RN Patient and family notified of transfer on 12/26/2013 Name of family member notified:  [name]- son  The following physician request were  entered in Epic:   Additional Comments:  Benay Pike, [place]

## 2013-12-26 NOTE — Progress Notes (Addendum)
Subjective: She says she doesn't feel well. She's complaining of pain which is fairly diffuse. She has no other new complaints. She did have episodes of ventricular tachycardia but these seem to have stabilized. She is still short of breath.  Objective: Vital signs in last 24 hours: Temp:  [97.9 F (36.6 C)-98.5 F (36.9 C)] 97.9 F (36.6 C) (10/13 0409) Pulse Rate:  [60-70] 60 (10/13 0754) Resp:  [18] 18 (10/13 0409) BP: (109-118)/(30-38) 112/38 mmHg (10/13 0409) SpO2:  [81 %-100 %] 97 % (10/13 0757) Weight:  [66.1 kg (145 lb 11.6 oz)] 66.1 kg (145 lb 11.6 oz) (10/13 0409) Weight change: 0.7 kg (1 lb 8.7 oz) Last BM Date: 12/25/13  Intake/Output from previous day: 10/12 0701 - 10/13 0700 In: 720 [P.O.:720] Out: 1500 [Urine:1500]  PHYSICAL EXAM General appearance: alert, cooperative and moderate distress Resp: rales bilaterally and rhonchi bilaterally Cardio: Irregularly irregular with a loud systolic murmur GI: Mildly distended with questionable ascites Extremities: Her legs were swollen and erythematous  Lab Results:  Results for orders placed during the hospital encounter of 12/22/13 (from the past 48 hour(s))  BASIC METABOLIC PANEL     Status: Abnormal   Collection Time    12/25/13  6:38 AM      Result Value Ref Range   Sodium 142  137 - 147 mEq/L   Potassium 4.4  3.7 - 5.3 mEq/L   Chloride 102  96 - 112 mEq/L   CO2 28  19 - 32 mEq/L   Glucose, Bld 114 (*) 70 - 99 mg/dL   BUN 115 (*) 6 - 23 mg/dL   Creatinine, Ser 2.34 (*) 0.50 - 1.10 mg/dL   Calcium 8.7  8.4 - 10.5 mg/dL   GFR calc non Af Amer 16 (*) >90 mL/min   GFR calc Af Amer 19 (*) >90 mL/min   Comment: (NOTE)     The eGFR has been calculated using the CKD EPI equation.     This calculation has not been validated in all clinical situations.     eGFR's persistently <90 mL/min signify possible Chronic Kidney     Disease.   Anion gap 12  5 - 15  CBC WITH DIFFERENTIAL     Status: Abnormal   Collection Time     12/25/13  6:38 AM      Result Value Ref Range   WBC 11.8 (*) 4.0 - 10.5 K/uL   RBC 2.97 (*) 3.87 - 5.11 MIL/uL   Hemoglobin 9.3 (*) 12.0 - 15.0 g/dL   HCT 29.1 (*) 36.0 - 46.0 %   MCV 98.0  78.0 - 100.0 fL   MCH 31.3  26.0 - 34.0 pg   MCHC 32.0  30.0 - 36.0 g/dL   RDW 14.8  11.5 - 15.5 %   Platelets 209  150 - 400 K/uL   Neutrophils Relative % 74  43 - 77 %   Neutro Abs 8.7 (*) 1.7 - 7.7 K/uL   Lymphocytes Relative 19  12 - 46 %   Lymphs Abs 2.2  0.7 - 4.0 K/uL   Monocytes Relative 5  3 - 12 %   Monocytes Absolute 0.6  0.1 - 1.0 K/uL   Eosinophils Relative 2  0 - 5 %   Eosinophils Absolute 0.2  0.0 - 0.7 K/uL   Basophils Relative 0  0 - 1 %   Basophils Absolute 0.0  0.0 - 0.1 K/uL  CBC WITH DIFFERENTIAL     Status: Abnormal   Collection  Time    12/26/13  5:47 AM      Result Value Ref Range   WBC 11.6 (*) 4.0 - 10.5 K/uL   RBC 2.86 (*) 3.87 - 5.11 MIL/uL   Hemoglobin 9.0 (*) 12.0 - 15.0 g/dL   HCT 28.0 (*) 36.0 - 46.0 %   MCV 97.9  78.0 - 100.0 fL   MCH 31.5  26.0 - 34.0 pg   MCHC 32.1  30.0 - 36.0 g/dL   RDW 14.3  11.5 - 15.5 %   Platelets 195  150 - 400 K/uL   Neutrophils Relative % 78 (*) 43 - 77 %   Neutro Abs 9.1 (*) 1.7 - 7.7 K/uL   Lymphocytes Relative 15  12 - 46 %   Lymphs Abs 1.7  0.7 - 4.0 K/uL   Monocytes Relative 6  3 - 12 %   Monocytes Absolute 0.7  0.1 - 1.0 K/uL   Eosinophils Relative 1  0 - 5 %   Eosinophils Absolute 0.1  0.0 - 0.7 K/uL   Basophils Relative 0  0 - 1 %   Basophils Absolute 0.0  0.0 - 0.1 K/uL    ABGS No results found for this basename: PHART, PCO2, PO2ART, TCO2, HCO3,  in the last 72 hours CULTURES Recent Results (from the past 240 hour(s))  MRSA PCR SCREENING     Status: None   Collection Time    12/22/13  6:10 PM      Result Value Ref Range Status   MRSA by PCR NEGATIVE  NEGATIVE Final   Comment:            The GeneXpert MRSA Assay (FDA     approved for NASAL specimens     only), is one component of a     comprehensive  MRSA colonization     surveillance program. It is not     intended to diagnose MRSA     infection nor to guide or     monitor treatment for     MRSA infections.   Studies/Results: No results found.  Medications:  Prior to Admission:  Prescriptions prior to admission  Medication Sig Dispense Refill  . aspirin EC 81 MG EC tablet Take 1 tablet (81 mg total) by mouth daily.  30 tablet  12  . HYDROcodone-acetaminophen (NORCO/VICODIN) 5-325 MG per tablet Take 1 tablet by mouth every 4 (four) hours as needed for severe pain.      [name] losartan (COZAAR) 100 MG tablet Take 100 mg by mouth daily.      . metoprolol tartrate (LOPRESSOR) 25 MG tablet Take 25 mg by mouth 2 (two) times daily.      . Multiple Vitamins-Minerals (PRESERVISION AREDS 2) CAPS Take 1 capsule by mouth 2 (two) times daily.      . nitroGLYCERIN (NITROSTAT) 0.4 MG SL tablet Place 1 tablet (0.4 mg total) under the tongue every 5 (five) minutes as needed for chest pain.  24 tablet  4  . pravastatin (PRAVACHOL) 40 MG tablet Take 40 mg by mouth at bedtime.      . torsemide (DEMADEX) 20 MG tablet Take 1 tablet (20 mg total) by mouth 2 (two) times daily. Take 1 tablet daily  60 tablet  0  . verapamil (CALAN-SR) 180 MG CR tablet Take 180 mg by mouth at bedtime.      [name] warfarin (COUMADIN) 2.5 MG tablet Take 1.25-2.5 mg by mouth daily. Patient takes 1.34m on Mon, Wed and Fri. And 2.559m  all other days       Scheduled: . antiseptic oral rinse  7 mL Mouth Rinse BID  . aspirin EC  81 mg Oral Daily  . feeding supplement (PRO-STAT SUGAR FREE 64)  30 mL Oral TID WC  . furosemide  20 mg Intravenous Q12H  . levalbuterol  0.63 mg Nebulization BID  . metoprolol tartrate  12.5 mg Oral BID  . polyethylene glycol  17 g Oral Daily  . pravastatin  40 mg Oral QHS  . sodium chloride  3 mL Intravenous Q12H  . verapamil  180 mg Oral QHS   Continuous:  YVD:PBAQVO chloride, ALPRAZolam, morphine injection, nitroGLYCERIN, ondansetron (ZOFRAN) IV,  oxyCODONE, sodium chloride  Assesment: She is admitted with acute on chronic diastolic heart failure. She has aortic stenosis that is not amenable to surgery. She has acute on chronic renal failure and cirrhosis. She has agreed to comfort care is the main goal of therapy. She complains of severe pain in her perineal area and she continues to have back pain Principal Problem:   Acute on chronic diastolic congestive heart failure Active Problems:   Aortic stenosis   Paroxysmal atrial fibrillation   Hepatic cirrhosis   Acute on chronic renal failure    Plan: Continue to push morphine. She has a device at home that she thinks may help with her sacral wound and we will try that. Await  Bed at SNF.    LOS: 4 days   [name] L 12/26/2013, 9:03 AM

## 2013-12-26 NOTE — Progress Notes (Signed)
Patient given Morphine and xanax at this time as per her request. Patient stated she is very uncomfortable and anxious. Assisted up to [facility] at this time and patient up to chair at this time.

## 2013-12-26 NOTE — Discharge Summary (Signed)
[name], [name] NO.:  [id number]  MEDICAL RECORD NO.:  [id number]  LOCATION:                                 FACILITY:  PHYSICIAN:  [name], M.D.DATE OF BIRTH:  [birth year]  DATE OF ADMISSION:  12/22/2013 DATE OF DISCHARGE:  [facility]                              DISCHARGE SUMMARY   FINAL DISCHARGE DIAGNOSES: 1. Acute on chronic diastolic heart failure. 2. Acute on chronic renal failure. 3. Critical aortic stenosis. 4. Hepatic cirrhosis with ascites. 5. Paroxysmal atrial fibrillation. 6. Anemia multifactorial. 7. Arteriosclerotic cardiovascular disease. 8. Chronic lymphocytic leukemia. 9. Decubitus ulcer in sacral region stage I. 10.Gastroesophageal reflux disease. 11.Chronic hematuria. 12.Chronic gastrointestinal bleeding. 13.Hypertension. 14.Hyperlipidemia. 15.History of non-ST elevation myocardial infarction. 16.Sick sinus syndrome, status post pacemaker.  HISTORY:  This is a 80 year old, who came to the emergency department with increasing shortness of breath.  She has been in and out of the hospital on multiple occasions and over the last several months, she has significantly deteriorated.  She has only been able to walk about 10 feet.  She has been taking diuretics but it has not worked.  She has had recent diagnosis of cirrhosis of the liver and has required therapeutic paracentesis.  When she came to the emergency department, her creatinine was 2 which is up from her baseline.  BNP was 12,800.  Chest x-ray showed CHF.  HOSPITAL COURSE:  She was treated with intravenous Lasix with close attention to her renal function.  I discussed the fact with her that I think that her situation has now got to the point where we need to consider comfort care and she agreed.  She has been started on medications for that.  She had originally thought that she was going to be at a assisted living facility but it appears that she is needing a higher  level of care than that.  She is going to be transferred to a skilled care facility.  She will have heparin lock IV.  She will be on aspirin 81 mg daily, hydrocodone 5/325 one every 4 hours as needed for pain, nitroglycerin 0.4 mg every 5 minutes as needed for chest pain, pravastatin 40 mg at bedtime, PreserVision capsules 1 capsule by mouth twice a day, torsemide 20 mg by mouth twice a day, verapamil 180 mg extended release at bedtime.  She will be off losartan because of her renal function and off warfarin.  She will have a change in her medication metoprolol and will take 12.5 mg b.i.d.  She will have Xanax 0.25 mg every 4 hours as needed for anxiety.  Feeding supplement, Pro- Stat 30 mL 3 times a day with meals, Xopenex 0.63 b.i.d. and p.r.n., morphine 1 mg every 2 hours as needed for pain or shortness of breath.  Zofran, it is listed on her discharge summary as an injection but it will be oral 4 mg p.o. q.6h. p.r.n. nausea and MiraLax 17 g daily.  She will be on oxygen at 2 L.     Fady Stamps L. Luan [name], M.D.     EL[facility]/MEDQ  D:  12/26/2013  T:  12/26/2013  Job:  [id number]

## 2013-12-27 NOTE — Progress Notes (Signed)
UR chart review completed.  

## 2014-01-15 ENCOUNTER — Encounter (HOSPITAL_COMMUNITY): Payer: Self-pay | Admitting: Emergency Medicine

## 2014-01-15 ENCOUNTER — Other Ambulatory Visit: Payer: Self-pay | Admitting: Nurse Practitioner

## 2014-01-18 ENCOUNTER — Ambulatory Visit (HOSPITAL_COMMUNITY): Payer: No Typology Code available for payment source | Attending: Pulmonary Disease

## 2014-01-18 DIAGNOSIS — I509 Heart failure, unspecified: Secondary | ICD-10-CM | POA: Insufficient documentation

## 2014-01-18 DIAGNOSIS — R0602 Shortness of breath: Secondary | ICD-10-CM | POA: Insufficient documentation

## 2014-01-18 DIAGNOSIS — N189 Chronic kidney disease, unspecified: Secondary | ICD-10-CM | POA: Insufficient documentation

## 2014-01-24 NOTE — H&P (Signed)
This is documentation of history and physical were performed at the skilled care facility on 01/21/2014. She says she still feels very short of breath. She has a lot of swelling. She has problems because she has to get up and urinate frequently and this makes her more short of breath. She understands that she needs to take the diuretics but it does cause her urinate frequently. She is interested in being more comfortable and I asked her about comfort care and she wants to pursue that. She requests Foley catheter.  Her past medical history is positive for multiple medical problems including congestive heart failure coronary artery occlusive disease aortic stenosis cirrhosis of the liver with ascites and chronic lymphocytic leukemia chronic GI bleeding and chronic bleeding from her urethra  Social history she has a son who is active in her care she is a nonsmoker does not use any alcohol and had previously lived at home alone  Family history noncontributory at her advanced age  Review of systems she's not eating very well. She's not having any chest pain. She is uncomfortable in her back and uncomfortable in her sacral area where she has an early decubitus.  Exam shows she is awake and alert and oriented. Her pupils react. Nose and throat are clear. Although her weight is stable she looks cachectic and I think she has a large amount of fluid weight. Her neck is supple. Chest shows some rhonchi bilaterally. Her heart is irregular with a loud systolic murmur. Her abdomen is soft protuberant and I think she probably has some ascitic fluid extremities showed 3-4+ edema bilaterally. Central nervous system exam is grossly intact  I would plan to continue trying to keep her comfortable use morphine as much as needed place a Foley catheter to see if she can not have to get up and urinate so frequently.

## 2014-02-13 DEATH — deceased

## 2014-03-30 ENCOUNTER — Other Ambulatory Visit (HOSPITAL_COMMUNITY): Payer: Self-pay | Admitting: Oncology

## 2015-06-08 IMAGING — US US ABDOMEN LIMITED
1 series · 6 of 6 positions shown · non-contrast
Comparison: CT abdomen and pelvis 11/15/2012.

CLINICAL DATA: Abdominal distention.

EXAM:
LIMITED ABDOMEN ULTRASOUND FOR ASCITES
US ABDOMEN LIMITED
TECHNIQUE: Limited ultrasound survey for ascites was performed in all four
abdominal quadrants.

[Series 1: us abdomen limited · 0.20mm/px · 6 of 6 slices shown]
[im 1/6]
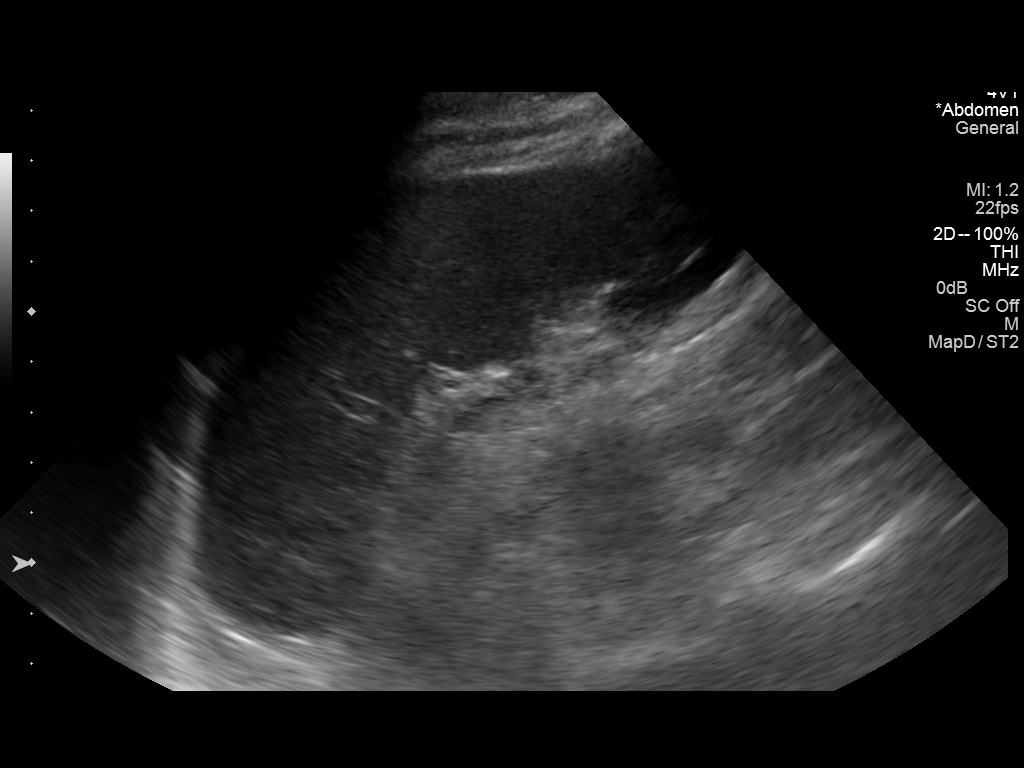
[im 2/6]
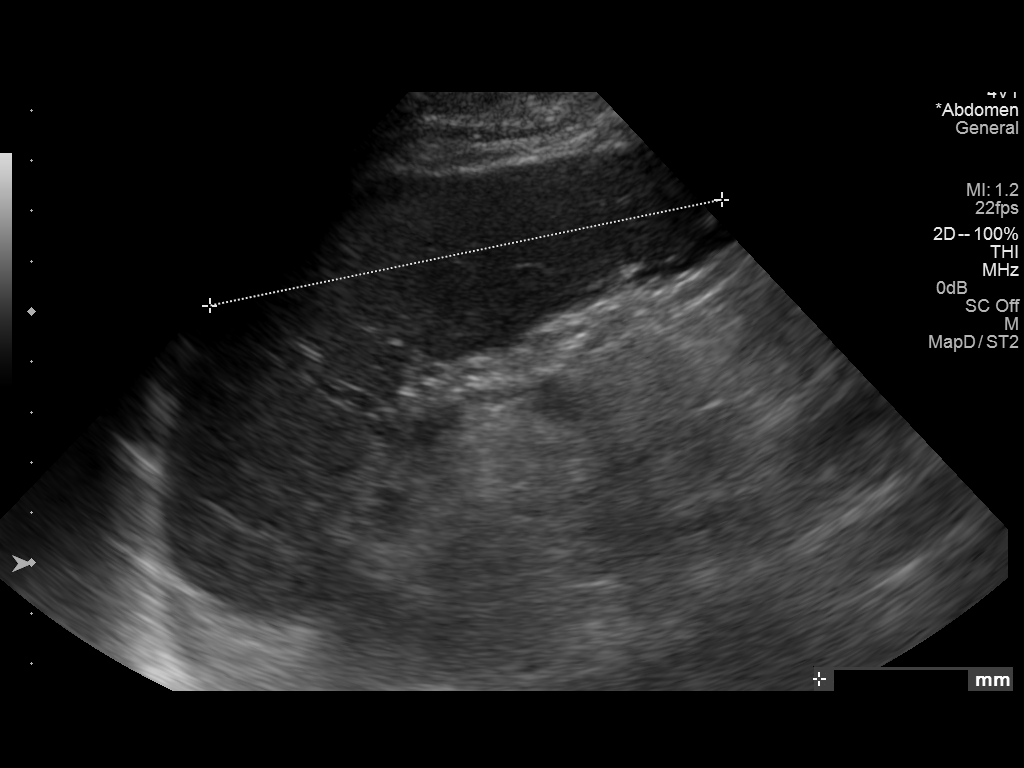
[im 3/6]
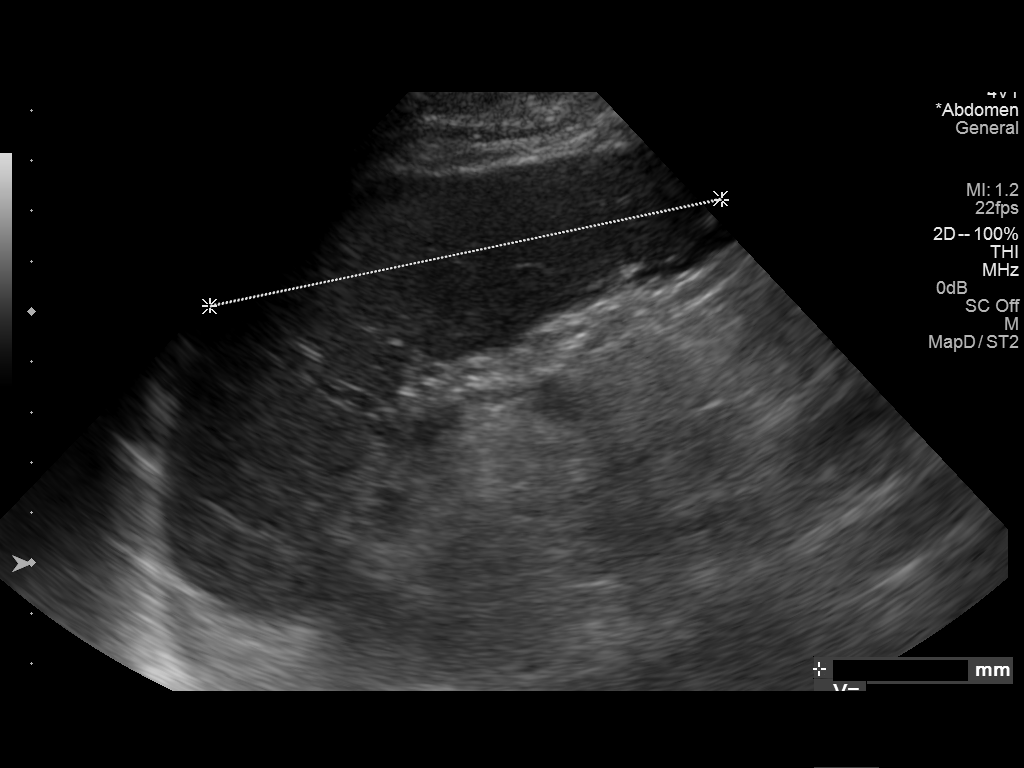
[im 4/6]
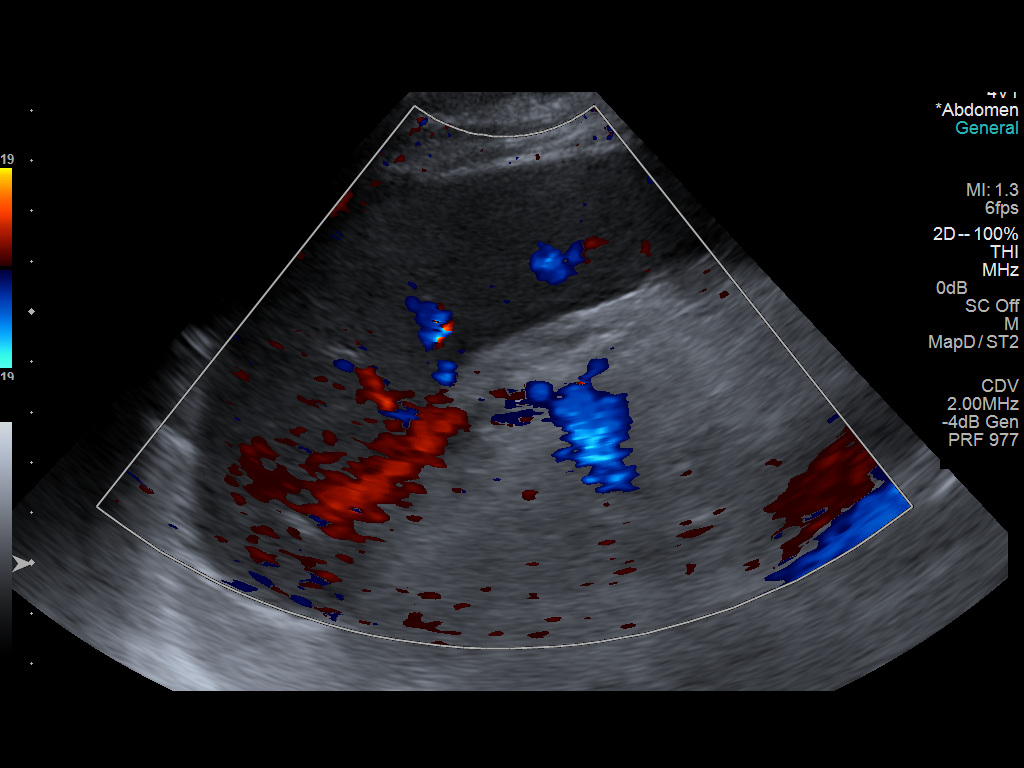
[im 5/6]
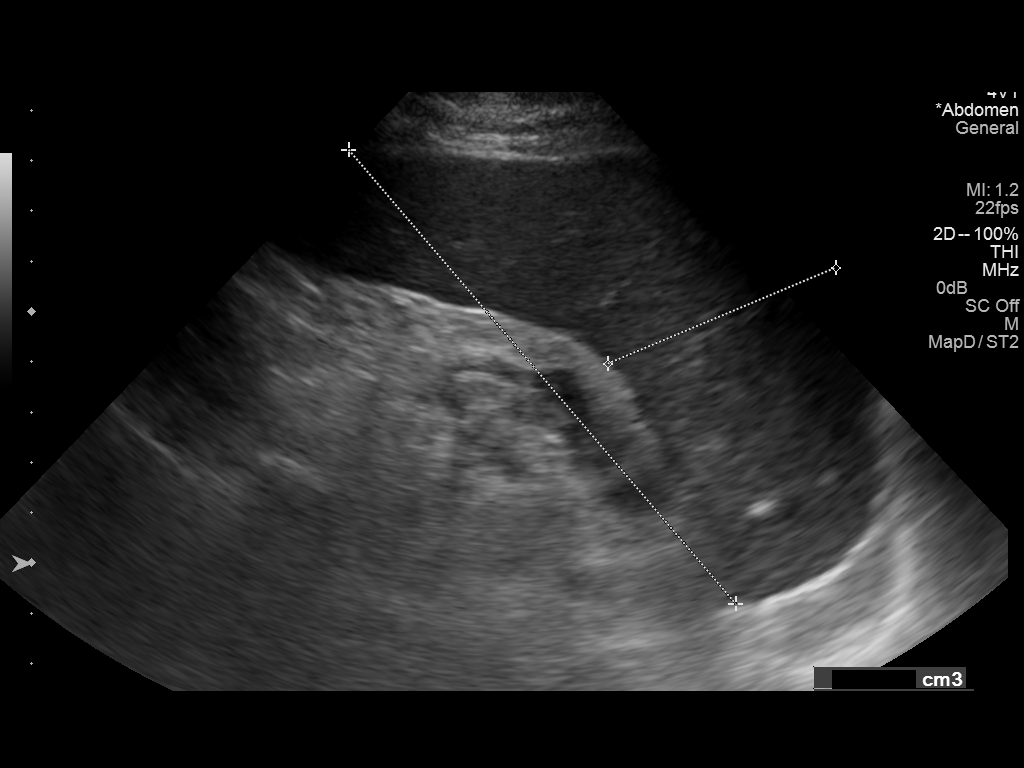
[im 6/6]
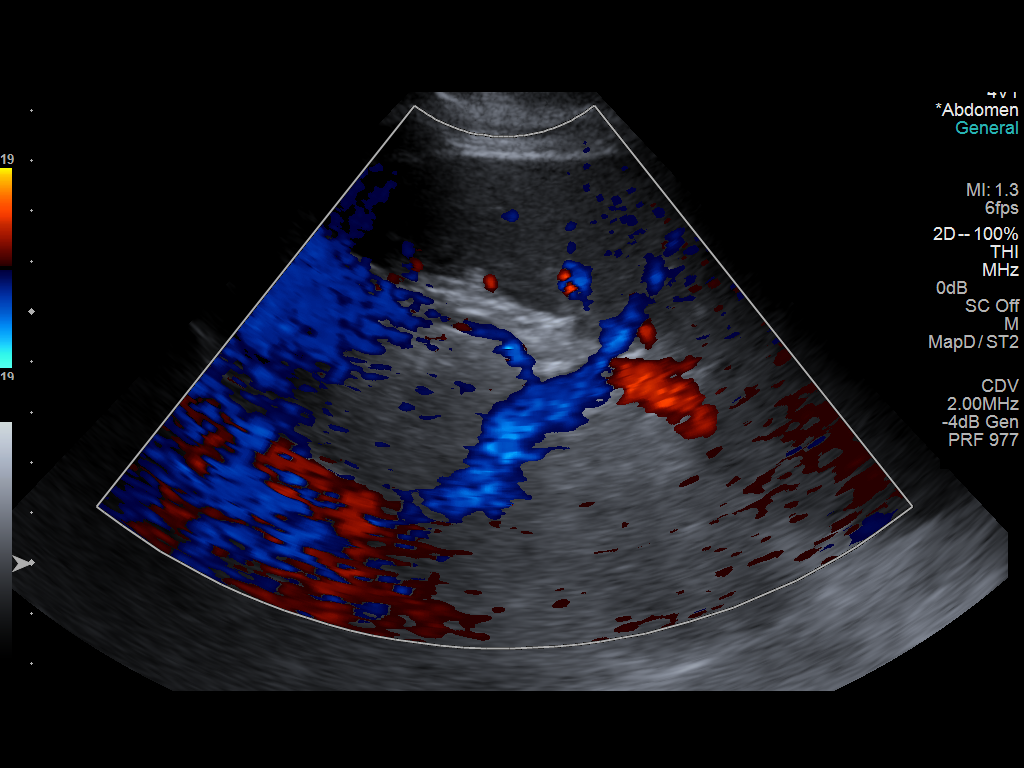

[6 of 6 positions shown; findings below may reference images not displayed]

FINDINGS: Moderate ascites is present. Splenic volume is within normal limits
at 317 cubic cm with a length of 10.4 cm similar to prior CT.
IMPRESSION: Moderate ascites.  This appears be increased from prior CT.

## 2015-06-15 IMAGING — CR DG CHEST 1V PORT
1 series · 1 of 1 positions shown · non-contrast
Comparison: CT of the chest July 15, 2012 and chest radiograph September 02, 2012

CLINICAL DATA: Chest pain and shortness breath, history of CHF.

EXAM:
PORTABLE CHEST - 1 VIEW

[portable]
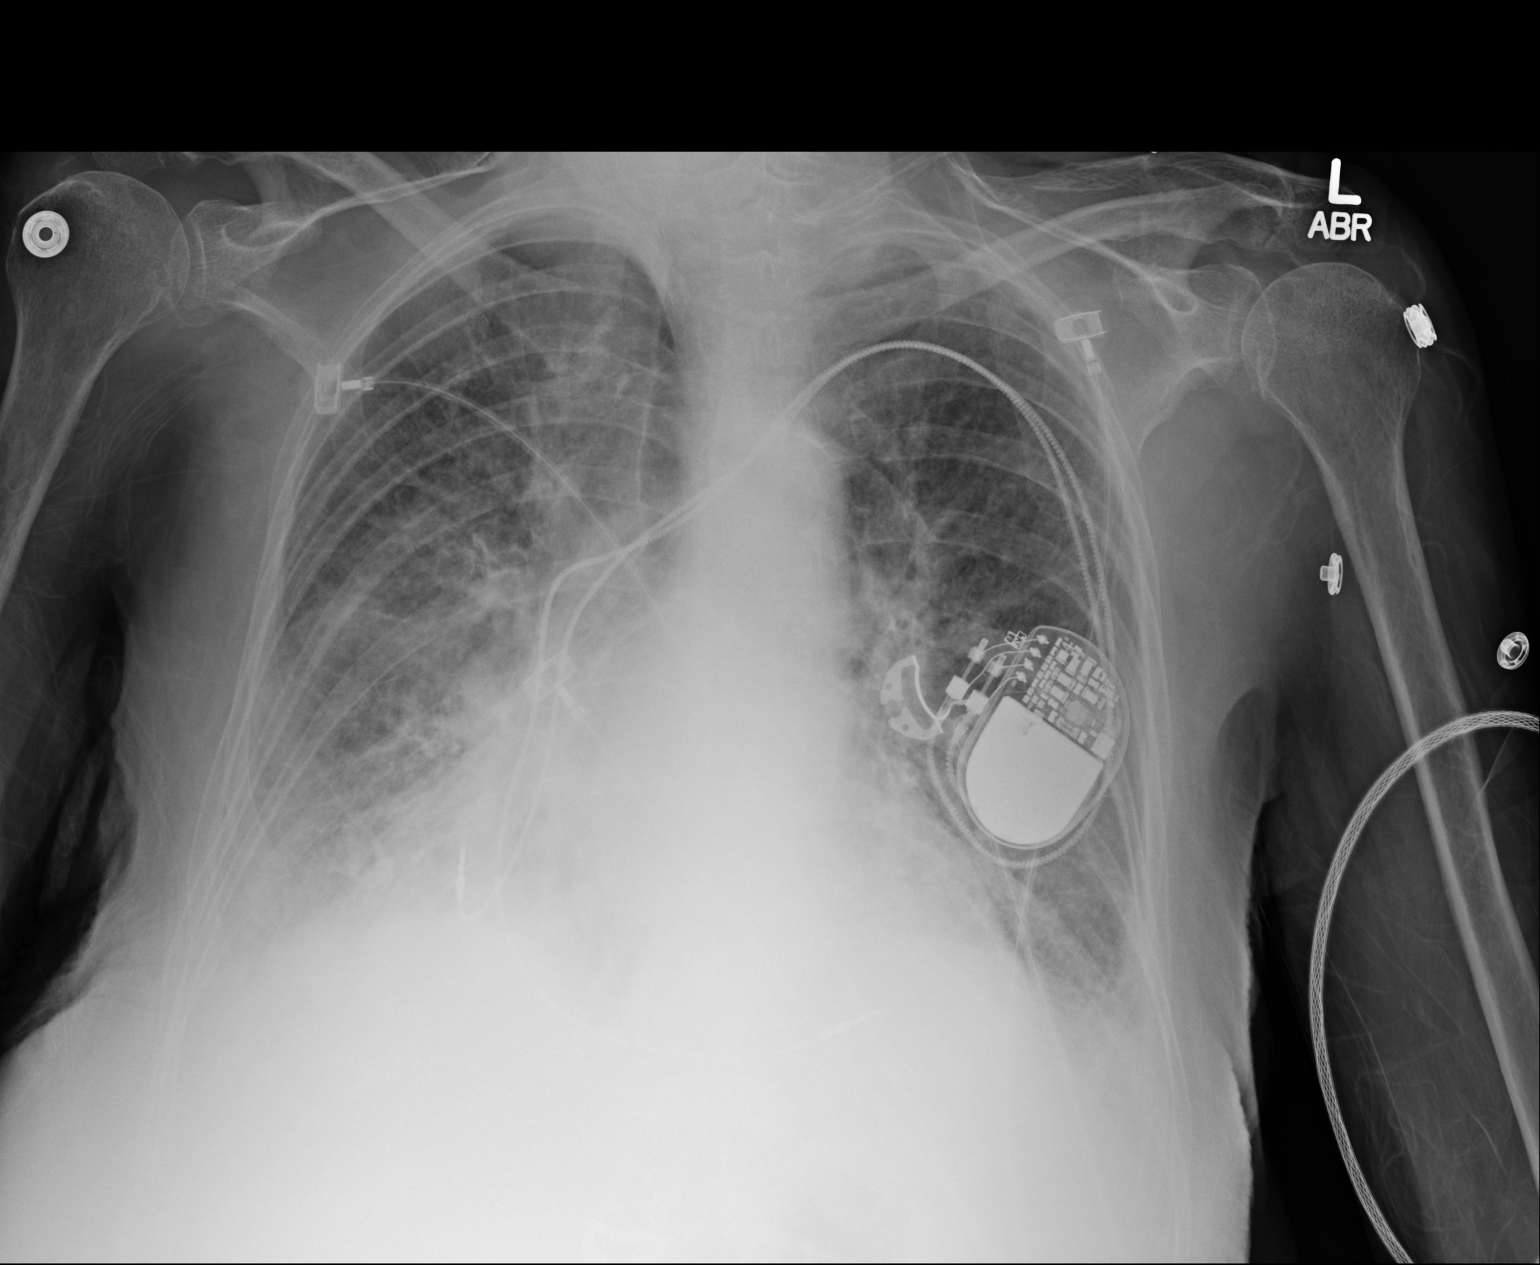

[1 of 1 positions shown; findings below may reference images not displayed]

FINDINGS: The cardiac silhouette appears moderate to severely enlarged, which
may be accentuated by AP technique. Diffuse interstitial prominence,
small to moderate bilateral pleural effusions and bibasilar patchy
airspace opacities. Patient's known pulmonary nodules difficult to
appreciate on radiography. No pneumothorax.

Dual lead left cardiac pacemaker in situ. Patient is osteopenic.
Soft tissue planes are nonsuspicious.
IMPRESSION: Cardiomegaly, interstitial and bibasilar airspace opacities suggest
pulmonary edema with small to moderate pleural effusions.

  By: Anacleto Sui

## 2015-08-27 IMAGING — CR DG CHEST 1V
1 series · 1 of 1 positions shown · non-contrast
Comparison: Portable chest x-ray December 22, 2013

CLINICAL DATA: Shortness of breath; history of CHF and chronic
renal insufficiency

EXAM:
CHEST - 1 VIEW

[view not recorded]
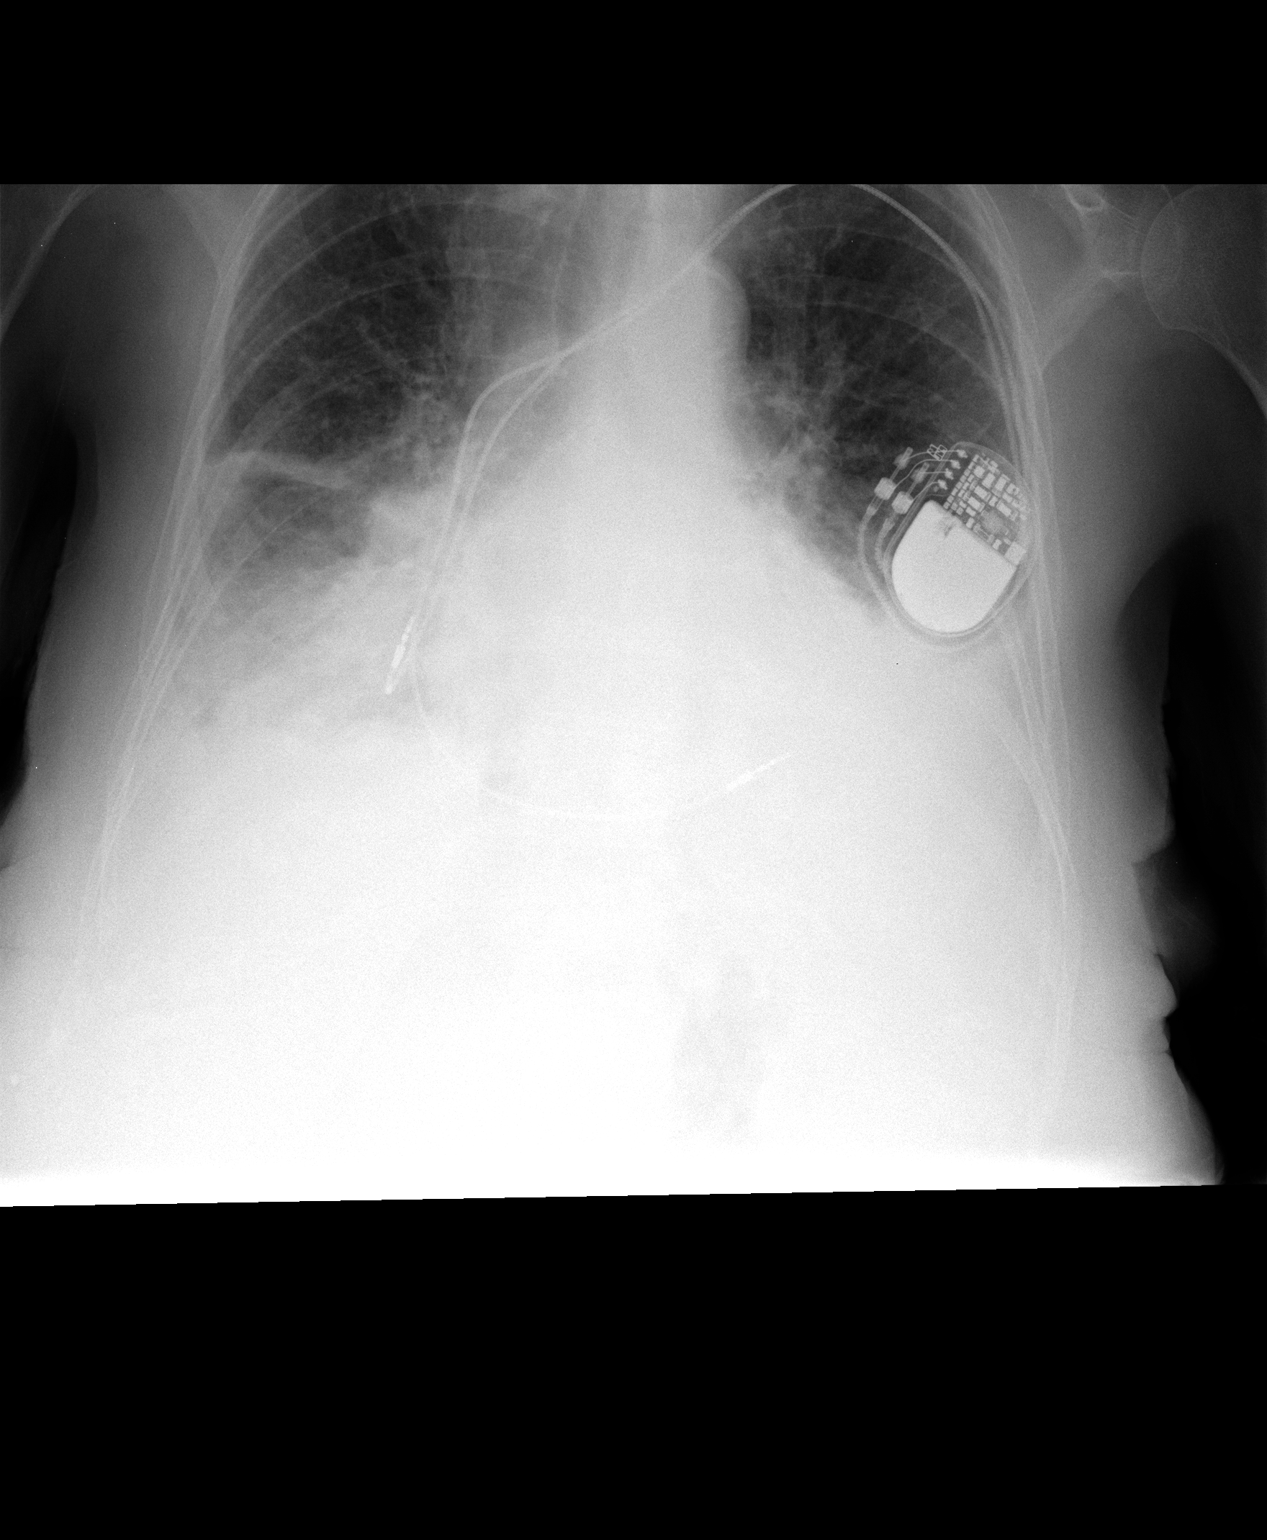

[1 of 1 positions shown; findings below may reference images not displayed]

FINDINGS: The lungs are adequately inflated. There are increased densities at
both lung bases. This has increased most conspicuously on the left.
The hemidiaphragms are obscured. There is fluid in the minor
fissure. The cardiac silhouette is enlarged. The pulmonary
vascularity is engorged. The permanent pacemaker is in appropriate
position radiographically. The observed bony thorax is unremarkable.
IMPRESSION: Congestive heart failure with pulmonary interstitial edema and
bilateral pleural effusions. There is likely bibasilar atelectasis
or pneumonia as well.
